# Patient Record
Sex: Female | Born: 1970
Health system: Southern US, Community
[De-identification: ages and names within clinical notes are randomized; demographics above are authoritative.]

## PROBLEM LIST (undated history)

## (undated) DIAGNOSIS — K219 Gastro-esophageal reflux disease without esophagitis: Secondary | ICD-10-CM

## (undated) DIAGNOSIS — E119 Type 2 diabetes mellitus without complications: Secondary | ICD-10-CM

## (undated) DIAGNOSIS — H5440 Blindness, one eye, unspecified eye: Secondary | ICD-10-CM

## (undated) DIAGNOSIS — I10 Essential (primary) hypertension: Secondary | ICD-10-CM

## (undated) DIAGNOSIS — I639 Cerebral infarction, unspecified: Secondary | ICD-10-CM

## (undated) DIAGNOSIS — N63 Unspecified lump in unspecified breast: Secondary | ICD-10-CM

## (undated) DIAGNOSIS — N189 Chronic kidney disease, unspecified: Secondary | ICD-10-CM

## (undated) DIAGNOSIS — K029 Dental caries, unspecified: Secondary | ICD-10-CM

## (undated) DIAGNOSIS — F319 Bipolar disorder, unspecified: Secondary | ICD-10-CM

## (undated) DIAGNOSIS — F32A Depression, unspecified: Secondary | ICD-10-CM

## (undated) DIAGNOSIS — F329 Major depressive disorder, single episode, unspecified: Secondary | ICD-10-CM

## (undated) HISTORY — DX: Type 2 diabetes mellitus without complications: E11.9

## (undated) HISTORY — PX: MULTIPLE TOOTH EXTRACTIONS: SHX2053

---
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
## 1999-08-10 ENCOUNTER — Emergency Department (HOSPITAL_COMMUNITY): Admission: EM | Admit: 1999-08-10 | Discharge: 1999-08-10 | Payer: Self-pay | Admitting: Emergency Medicine

## 1999-11-10 ENCOUNTER — Encounter: Payer: Self-pay | Admitting: Internal Medicine

## 1999-11-10 ENCOUNTER — Emergency Department (HOSPITAL_COMMUNITY): Admission: EM | Admit: 1999-11-10 | Discharge: 1999-11-10 | Payer: Self-pay | Admitting: Internal Medicine

## 2000-08-05 ENCOUNTER — Other Ambulatory Visit: Admission: RE | Admit: 2000-08-05 | Discharge: 2000-08-05 | Payer: Self-pay | Admitting: Obstetrics

## 2000-11-29 ENCOUNTER — Encounter: Payer: Self-pay | Admitting: Emergency Medicine

## 2000-11-29 ENCOUNTER — Emergency Department (HOSPITAL_COMMUNITY): Admission: EM | Admit: 2000-11-29 | Discharge: 2000-11-29 | Payer: Self-pay | Admitting: Emergency Medicine

## 2010-06-07 ENCOUNTER — Inpatient Hospital Stay (HOSPITAL_COMMUNITY): Admission: EM | Admit: 2010-06-07 | Discharge: 2010-06-09 | Payer: Self-pay | Admitting: Emergency Medicine

## 2010-06-07 DIAGNOSIS — H3411 Central retinal artery occlusion, right eye: Secondary | ICD-10-CM

## 2010-06-07 DIAGNOSIS — I1A0 Resistant hypertension: Secondary | ICD-10-CM | POA: Insufficient documentation

## 2010-06-07 DIAGNOSIS — H547 Unspecified visual loss: Secondary | ICD-10-CM

## 2010-06-07 DIAGNOSIS — I10 Essential (primary) hypertension: Secondary | ICD-10-CM

## 2010-06-08 ENCOUNTER — Encounter (INDEPENDENT_AMBULATORY_CARE_PROVIDER_SITE_OTHER): Payer: Self-pay | Admitting: Internal Medicine

## 2010-06-08 ENCOUNTER — Ambulatory Visit: Payer: Self-pay | Admitting: Surgery

## 2010-06-08 LAB — CONVERTED CEMR LAB: TSH: 3.287 microintl units/mL

## 2010-06-09 LAB — CONVERTED CEMR LAB
ALT: 13 units/L
AST: 15 units/L
Albumin: 3.6 g/dL
Alkaline Phosphatase: 66 units/L
BUN: 9 mg/dL
CO2: 28 meq/L
Calcium: 9.2 mg/dL
Chloride: 102 meq/L
Cholesterol: 148 mg/dL
Creatinine, Ser: 0.78 mg/dL
Glucose, Bld: 114 mg/dL
HCT: 40.3 %
HDL: 37 mg/dL
Hemoglobin: 13.6 g/dL
LDL Cholesterol: 92 mg/dL
MCHC: 33.6 g/dL
MCV: 80.5 fL
Platelets: 290 10*3/uL
Potassium: 3.7 meq/L
RBC: 5 M/uL
RDW: 16.2 %
Sodium: 138 meq/L
Total Bilirubin: 0.5 mg/dL
Total CHOL/HDL Ratio: 4
Total Protein: 7.2 g/dL
Triglycerides: 94 mg/dL
VLDL: 19 mg/dL
WBC: 8.6 10*3/uL

## 2010-06-27 ENCOUNTER — Ambulatory Visit: Payer: Self-pay | Admitting: Internal Medicine

## 2010-06-27 DIAGNOSIS — F411 Generalized anxiety disorder: Secondary | ICD-10-CM

## 2010-06-27 DIAGNOSIS — F17200 Nicotine dependence, unspecified, uncomplicated: Secondary | ICD-10-CM | POA: Insufficient documentation

## 2010-06-27 DIAGNOSIS — F418 Other specified anxiety disorders: Secondary | ICD-10-CM | POA: Insufficient documentation

## 2010-07-04 ENCOUNTER — Ambulatory Visit: Payer: Self-pay | Admitting: Nurse Practitioner

## 2010-07-11 ENCOUNTER — Ambulatory Visit: Payer: Self-pay | Admitting: Nurse Practitioner

## 2010-07-12 ENCOUNTER — Ambulatory Visit: Payer: Self-pay | Admitting: Internal Medicine

## 2010-07-18 ENCOUNTER — Ambulatory Visit: Payer: Self-pay | Admitting: Nurse Practitioner

## 2010-07-21 ENCOUNTER — Ambulatory Visit: Payer: Self-pay | Admitting: Internal Medicine

## 2010-07-28 ENCOUNTER — Telehealth (INDEPENDENT_AMBULATORY_CARE_PROVIDER_SITE_OTHER): Payer: Self-pay | Admitting: Nurse Practitioner

## 2010-08-10 ENCOUNTER — Ambulatory Visit: Payer: Self-pay | Admitting: Nurse Practitioner

## 2010-08-10 DIAGNOSIS — K219 Gastro-esophageal reflux disease without esophagitis: Secondary | ICD-10-CM

## 2010-08-10 DIAGNOSIS — F319 Bipolar disorder, unspecified: Secondary | ICD-10-CM

## 2010-08-10 LAB — CONVERTED CEMR LAB
BUN: 13 mg/dL (ref 6–23)
CO2: 28 meq/L (ref 19–32)
Calcium: 9.5 mg/dL (ref 8.4–10.5)
Chloride: 99 meq/L (ref 96–112)
Creatinine, Ser: 0.83 mg/dL (ref 0.40–1.20)
Glucose, Bld: 88 mg/dL (ref 70–99)
Potassium: 3.9 meq/L (ref 3.5–5.3)
Sodium: 138 meq/L (ref 135–145)

## 2010-08-11 ENCOUNTER — Encounter (INDEPENDENT_AMBULATORY_CARE_PROVIDER_SITE_OTHER): Payer: Self-pay | Admitting: Nurse Practitioner

## 2010-09-14 ENCOUNTER — Telehealth (INDEPENDENT_AMBULATORY_CARE_PROVIDER_SITE_OTHER): Payer: Self-pay | Admitting: Nurse Practitioner

## 2010-09-20 ENCOUNTER — Encounter (INDEPENDENT_AMBULATORY_CARE_PROVIDER_SITE_OTHER): Payer: Self-pay | Admitting: Nurse Practitioner

## 2010-09-26 ENCOUNTER — Telehealth (INDEPENDENT_AMBULATORY_CARE_PROVIDER_SITE_OTHER): Payer: Self-pay | Admitting: Nurse Practitioner

## 2010-12-04 ENCOUNTER — Ambulatory Visit: Admit: 2010-12-04 | Payer: Self-pay | Admitting: Internal Medicine

## 2010-12-28 NOTE — Letter (Signed)
Summary: PT INFORMATION SHEET  PT INFORMATION SHEET   Imported By: Arta Bruce 07/28/2010 15:31:26  _____________________________________________________________________  External Attachment:    Type:   Image     Comment:   External Document

## 2010-12-28 NOTE — Assessment & Plan Note (Signed)
Summary: Recheck right breast incision  Nurse Visit   Vital Signs:  Patient profile:   40 year old female Temp:     98.1 degrees F oral Pulse rate:   72 / minute Pulse rhythm:   regular Resp:     20 per minute BP sitting:   126 / 92  (right arm)  Vitals Entered By: Dutch Quint RN (July 21, 2010 4:05 PM)  Impression & Recommendations:  Problem # 1:  CELLULITIS AND ABSCESS OF RIGHT BREAST (ICD-682.2)  Her updated medication list for this problem includes:    Augmentin 875-125 Mg Tabs (Amoxicillin-pot clavulanate) .Marland Kitchen... 1 tab by mouth two times a day for 10 days  Problem # 2:  HYPERTENSION, BENIGN ESSENTIAL (ICD-401.1)  Her updated medication list for this problem includes:    Metoprolol Tartrate 50 Mg Tabs (Metoprolol tartrate) ..... One tablet by mouth twice a day    Lisinopril-hydrochlorothiazide 10-12.5 Mg Tabs (Lisinopril-hydrochlorothiazide) .Marland Kitchen..Marland Kitchen Two tablets by mouth daily for blood pressure  Complete Medication List: 1)  Alprazolam 0.5 Mg Tabs (Alprazolam) .... One tablet by mouth two times a day as needed for nerves 2)  Metoprolol Tartrate 50 Mg Tabs (Metoprolol tartrate) .... One tablet by mouth twice a day 3)  Sertraline Hcl 50 Mg Tabs (Sertraline hcl) .... One tablet by mouth nightly 4)  Lisinopril-hydrochlorothiazide 10-12.5 Mg Tabs (Lisinopril-hydrochlorothiazide) .... Two tablets by mouth daily for blood pressure 5)  Aspirin Childrens 81 Mg Chew (Aspirin) .... One tablet by mouth daily 6)  Augmentin 875-125 Mg Tabs (Amoxicillin-pot clavulanate) .Marland Kitchen.. 1 tab by mouth two times a day for 10 days   Patient Instructions: 1)  Reviewed with Dr. Delrae Alfred 2)  Complete antibiotic. 3)  Continue warm soaks as needed for drainage and wound healing. 4)  Call if you develop fever or if wound becomes reddened/drainage increases. 5)  Make appt.for blood pressure check in two weeks.   Physical Exam  Skin:  still has healing incision right areola.  Small area remains open,  no visible drainage.   CC:  f/u Wound check right breast.  History of Present Illness: Had I&D of abscess right breast 07/11/10.  States still has small amount of purulent drainage from site.  Still taking antibiotic.   Review of Systems Derm:  States still small amount of pus drains from wound..  CC: f/u Wound check right breast   Allergies: No Known Drug Allergies  Orders Added: 1)  Est. Patient Level I [16109]

## 2010-12-28 NOTE — Assessment & Plan Note (Signed)
Summary: HTN   Vital Signs:  Patient profile:   40 year old female LMP:     08/02/2010 Weight:      228.9 pounds BMI:     35.98 Temp:     98.3 degrees F oral Pulse rate:   80 / minute Pulse rhythm:   regular Resp:     20 per minute BP sitting:   160 / 100  (left arm) Cuff size:   large  Vitals Entered By: Levon Hedger (August 10, 2010 3:08 PM)  Nutrition Counseling: Patient's BMI is greater than 25 and therefore counseled on weight management options.  Serial Vital Signs/Assessments:  Time      Position  BP       Pulse  Resp  Temp     By                     136/90                         Lehman Prom FNP  Comments: done by Levon Hedger By: Lehman Prom FNP   CC: follow-up visit BP...needs something for her nerves..has been having alot of headaches, Hypertension Management, Depression Pain Assessment Patient in pain? no       Does patient need assistance? Functional Status Self care Ambulation Normal LMP (date): 08/02/2010     Enter LMP: 08/02/2010   CC:  follow-up visit BP...needs something for her nerves..has been having alot of headaches, Hypertension Management, and Depression.  History of Present Illness:  Pt into the office for f/u on htn.  Pt was seen for an abscess and was I&D'd by Dr. Delrae Alfred.  Pt reports that area is healing well.  Social - pt is employed at [employer]  Depression History:      The patient presents with symptoms of depression which have been present for greater than two weeks.  The patient is having a depressed mood most of the day and has a diminished interest in her usual daily activities.  Positive alarm features for depression include fatigue (loss of energy).  However, she denies insomnia and recurrent thoughts of death or suicide.        Psychosocial stress factors include major life changes.  The patient denies that she feels like life is not worth living, denies that she wishes that she were dead, and denies that  she has thought about ending her life.         Depression Treatment History:  Prior Medication Used:   Start Date: Assessment of Effect:   Comments:  Zoloft (sertraline)     08/10/2010     --       dose increased to 50mg  - 2 tablets daily  Hypertension History:      She complains of headache, but denies chest pain, palpitations, and syncope.  She notes no problems with any antihypertensive medication side effects.  Pt has been taking lisinopril/HCTZ - 2 tablets by mouth daily.  Pt was instructed to take only 1 daily.        Positive major cardiovascular risk factors include hypertension and current tobacco user.  Negative major cardiovascular risk factors include female age less than 28 years old and no history of diabetes or hyperlipidemia.        Further assessment for target organ damage reveals no history of ASHD, cardiac end-organ damage (CHF/LVH), stroke/TIA, peripheral vascular disease, or renal insufficiency.      Habits &  Providers  Alcohol-Tobacco-Diet     Alcohol drinks/day: 0     Tobacco Status: current     Tobacco Counseling: to quit use of tobacco products     Cigarette Packs/Day: 0.25     Year Started: age 1  Exercise-Depression-Behavior     Have you felt down or hopeless? no     Have you felt little pleasure in things? no     Depression Counseling: not indicated; screening negative for depression     Drug Use: marijuanna  Allergies (verified): No Known Drug Allergies  Review of Systems CV:  Denies chest pain or discomfort. Resp:  Denies cough. GI:  Complains of indigestion; denies abdominal pain, nausea, and vomiting; Admits that she does have some heartburn and indigestion from time to time. MS:  Complains of cramps; in legs bilateral on last night..  Physical Exam  General:  alert.   Head:  normocephalic.   Lungs:  normal breath sounds.   Heart:  normal rate and regular rhythm.   Abdomen:  normal bowel sounds.   Msk:  normal ROM.   Neurologic:  alert  & oriented X3.     Impression & Recommendations:  Problem # 1:  HYPERTENSION, BENIGN ESSENTIAL (ICD-401.1) BP is still elevated today. DASH diet reviewed. Pt advised to take medications as ordered Her updated medication list for this problem includes:    Metoprolol Tartrate 50 Mg Tabs (Metoprolol tartrate) ..... One tablet by mouth twice a day    Lisinopril-hydrochlorothiazide 20-25 Mg Tabs (Lisinopril-hydrochlorothiazide) ..... One tablet by mouth daily for blood pressure  Orders: T-Basic Metabolic Panel [phone])  Problem # 2:  TOBACCO ABUSE (ICD-305.1) pt would like to quit smoking Will order chantix but will need to get depression stable at first  Problem # 3:  DEPRESSION (ICD-311) will increase sertraline to 100mg  by mouth nightly Her updated medication list for this problem includes:    Alprazolam 0.5 Mg Tabs (Alprazolam) ..... One tablet by mouth two times a day as needed for nerves    Sertraline Hcl 50 Mg Tabs (Sertraline hcl) .Marland Kitchen..Marland Kitchen Two tablets by mouth daily  Problem # 4:  GASTROESOPHAGEAL REFLUX DISEASE (ICD-530.81) advised pt of foods to avoid Her updated medication list for this problem includes:    Ranitidine Hcl 150 Mg Tabs (Ranitidine hcl) ..... One tablet by mouth before breakfast and before dinner  Complete Medication List: 1)  Alprazolam 0.5 Mg Tabs (Alprazolam) .... One tablet by mouth two times a day as needed for nerves 2)  Metoprolol Tartrate 50 Mg Tabs (Metoprolol tartrate) .... One tablet by mouth twice a day 3)  Sertraline Hcl 50 Mg Tabs (Sertraline hcl) .... Two tablets by mouth daily 4)  Lisinopril-hydrochlorothiazide 20-25 Mg Tabs (Lisinopril-hydrochlorothiazide) .... One tablet by mouth daily for blood pressure 5)  Aspirin Childrens 81 Mg Chew (Aspirin) .... One tablet by mouth daily 6)  Ranitidine Hcl 150 Mg Tabs (Ranitidine hcl) .... One tablet by mouth before breakfast and before dinner  Hypertension Assessment/Plan:      The patient's  hypertensive risk group is category B: At least one risk factor (excluding diabetes) with no target organ damage.  Her calculated 10 year risk of coronary heart disease is 6 %.  Today's blood pressure is 160/100.  Her blood pressure goal is < 140/90.  Patient Instructions: 1)  Schedule an appointment for a complete physical exam in the next 4-6 weeks. 2)  You will need PAP, Mammogram, u/a, microalbumin. 3)  Will get flu vaccine if available  4)  Will discuss smoking cessation Prescriptions: LISINOPRIL-HYDROCHLOROTHIAZIDE 20-25 MG TABS (LISINOPRIL-HYDROCHLOROTHIAZIDE) One tablet by mouth daily for blood pressure  #30 x 3   Entered and Authorized by:   Lehman Prom FNP   Signed by:   Lehman Prom FNP on 08/10/2010   Method used:   Print then Give to Patient   RxID:   [id number] RANITIDINE HCL 150 MG TABS (RANITIDINE HCL) One tablet by mouth before breakfast and before dinner  #60 x 3   Entered and Authorized by:   Lehman Prom FNP   Signed by:   Lehman Prom FNP on 08/10/2010   Method used:   Print then Give to Patient   RxID:   [id number]

## 2010-12-28 NOTE — Assessment & Plan Note (Signed)
Summary: NEW - Establish Care   Vital Signs:  Patient profile:   40 year old female LMP:     06/04/2010 Height:      67 inches Weight:      229.9 pounds BMI:     36.14 BSA:     2.15 Pulse rate:   72 / minute Pulse rhythm:   regular Resp:     16 per minute BP sitting:   190 / 110  (left arm) Cuff size:   large  Vitals Entered By: Levon Hedger (June 27, 2010 10:31 AM)  Nutrition Counseling: Patient's BMI is greater than 25 and therefore counseled on weight management options.  Serial Vital Signs/Assessments:  Time      Position  BP       Pulse  Resp  Temp     By           R Arm     158/100                        Levon Hedger           L Arm     170/110                        Levon Hedger both BP's done manually CC: follow-up visit [facility]....blindness in right eye...HTN, Hypertension Management Is Patient Diabetic? No Pain Assessment Patient in pain? yes       Does patient need assistance? Functional Status Self care Ambulation Normal LMP (date): 06/04/2010     Enter LMP: 06/04/2010   CC:  follow-up visit [facility]....blindness in right eye.Marland KitchenMarland KitchenHTN and Hypertension Management.  History of Present Illness:  Pt into the office for hospital f/u, Pt was previously a pt at [facility] over 3 years ago. No routine PCP since that time.  Hospitalized from 06/07/2010 to 06/09/2010 On day of presentation pt woke up with blindness in the right eye.  She went to the ER.  BP noted to be very elevated.  No previous Dx of HTN however strong family hx  1. 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 2 D Echo - unremarkable Carotid dopplers - unremarkable Hypercoagulable panel - unremarkable ANA -negative Pt did go see the neurologist [facility]) as ordered - she was told that her vision loss was permanent.  2.  HTN - Started on metporolol 25mg  by mouth two times a day.  5 days ago she started taking once per day until her  visit here.    3. Tobacco abuse - ongoing.  Pt was started on a patch in the hospital but she reports that she did not wear it upon d/c because it made her sick to her stomach  4.  Anxiety - increased since recent hospitalization.  she has been taking sertralazine and alprazolam which she reports has been making her sleepy.  she has been taking alprazolam as ordered and NOT as needed     Hypertension History:      She denies headache, chest pain, and palpitations.  She notes no problems with any antihypertensive medication side effects.        Positive major cardiovascular risk factors include hypertension and current tobacco Jayma Volpi.  Negative major cardiovascular risk factors include female age less than 68 years old and no history of diabetes or hyperlipidemia.        Further assessment for target organ damage reveals  no history of ASHD, cardiac end-organ damage (CHF/LVH), stroke/TIA, peripheral vascular disease, or renal insufficiency.     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 Eyes:  Complains of vision loss-1 eye; right . CV:  Denies chest pain or discomfort. Resp:  Denies cough. GI:  Complains of indigestion.  Physical Exam  General:  obese Head:  normocephalic.   Lungs:  normal breath sounds.   Heart:  normal rate and regular rhythm.     Abdomen:  normal bowel sounds.   Msk:  up to the exam table Neurologic:  alert & oriented X3.     Impression & Recommendations:  Problem # 1:  HYPERTENSION, BENIGN ESSENTIAL (ICD-401.1)  BP elevated today will give clonidine 0.1mg  by mouth x 1 dose in office handout given, diet reviewed Her updated medication list for this problem includes:    Metoprolol Tartrate 25 Mg Tabs (Metoprolol tartrate) ..... One tablet by mouth two times a day for blood pressure    Lisinopril-hydrochlorothiazide 10-12.5 Mg Tabs (Lisinopril-hydrochlorothiazide) ..... One tablet by mouth daily for blood pressure  Orders: Clonidine 0.1mg  tab [facility])  Problem # 2:  ANXIETY (ICD-300.00) advised pt to take sertraline nightly take alprazolam as needed  Her updated medication list for this problem includes:    Alprazolam 0.5 Mg Tabs (Alprazolam) ..... One tablet by mouth two times a day as needed for nerves    Sertraline Hcl 50 Mg Tabs (Sertraline hcl) ..... 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 One tablet by mouth two times a day as needed for nerves 2)  Metoprolol Tartrate 25 Mg Tabs (Metoprolol tartrate) .... One tablet by mouth two times a day for blood pressure 3)  Sertraline Hcl 50 Mg Tabs (Sertraline hcl) .... One tablet by mouth nightly 4)  Lisinopril-hydrochlorothiazide 10-12.5 Mg Tabs (Lisinopril-hydrochlorothiazide) .... One tablet by mouth daily for blood pressure 5)  Aspirin Childrens 81 Mg Chew (Aspirin) .... One tablet by mouth daily  Hypertension Assessment/Plan:      The patient's hypertensive risk group is category B: At least one risk factor (excluding diabetes) with no target organ damage.  Her calculated 10 year risk of  coronary heart disease is 6 %.  Today's blood pressure is 190/110.  Her blood pressure goal is < 140/90.  Patient Instructions: 1)  You need to get an Eligibility appointment. 2)  Your blood pressure is VERY high today. 3)  Get your medications and take as ordered 4)  consider changing your lifestyle - decrease salt in your diet,  start walking to decrease weight, stop smoking. 5)  Schedule a Triage visit for blood pressure check in 1 week with Aggie Cosier.  Take your medications at least 2 hours before this visit. 6)  Goal less than 140/85 7)  Follow up with n.martin,fnp for htn and anxiety after eligibility Prescriptions: LISINOPRIL-HYDROCHLOROTHIAZIDE 10-12.5 MG TABS (LISINOPRIL-HYDROCHLOROTHIAZIDE) One tablet by mouth daily for blood pressure  #30 x 1   Entered and Authorized by:   Lehman Prom FNP   Signed by:   Lehman Prom FNP on 06/27/2010   Method used:   Print then Give to Patient   RxID:   [id number] METOPROLOL TARTRATE 25 MG TABS (METOPROLOL TARTRATE) One tablet by mouth two times a day for blood pressure  #60 x 1   Entered and Authorized by:   Lehman Prom FNP   Signed by:   Lehman Prom FNP on 06/27/2010   Method used:   Print then Give to Patient   RxID:   [id number] SERTRALINE HCL 50 MG TABS (SERTRALINE HCL) One tablet by mouth nightly  #30 x 1   Entered and Authorized by:   Lehman Prom FNP   Signed by:   Lehman Prom FNP on 06/27/2010   Method used:   Print then Give to Patient   RxID:   [id number]    MRI Brain  Procedure date:  06/07/2010  Findings:      Showed partially empty sella appearance, cna be a normal variant partially associated with idiopathic intracranial hypertension which can be cause of headache and vision change  CT Scan  Procedure date:  06/07/2010  Findings:      non aggressive imaging features.   no acute intracranial abnormality, specifically no cause of vision loss right eye identified   MRI  Brain  Procedure date:  06/07/2010  Findings:      Showed partially empty sella appearance, cna be a normal variant partially associated with idiopathic intracranial hypertension which can be cause of headache and vision change  CT Scan  Procedure date:  06/07/2010  Findings:      non aggressive imaging features.   no acute intracranial abnormality, specifically no cause of vision loss right eye identified   Medication Administration  Medication # 1:    Medication: Clonidine 0.1mg  tab    Diagnosis: HYPERTENSION, BENIGN ESSENTIAL (ICD-401.1)    Dose: 1 tablet    Route: po    Exp Date: 10/2010    Lot #: [id number]    Mfr: [employer]    Patient tolerated medication without complications    Given by: Levon Hedger (June 27, 2010 5:02 PM)  Orders Added: 1)  New Patient Level III [99203] 2)  Clonidine 0.1mg  tab [EMRORAL]

## 2010-12-28 NOTE — Assessment & Plan Note (Signed)
Summary: F/U for abscess right breast and BP  Nurse Visit   Vital Signs:  Patient profile:   40 year old female Pulse rate:   88 / minute Pulse rhythm:   regular Resp:     20 per minute BP sitting:   156 / 106  (left arm)  Vitals Entered By: Dutch Quint RN (July 18, 2010 3:41 PM)  Patient Instructions: 1)  Discussed with Wende Mott. 2)  May cleanse area with Betasept -- you can get it over the counter. 3)  Continue warm compresses every two hours for twenty minutes. 4)  Wound is still draining.  Keep light bandage over site. 5)  Complete entire course of antibiotics. 6)  Your blood pressure was elevated today, so we are Increasing Metoprolol to 50 mg. by mouth two times a day. 7)  Return for wound check on Friday with triage nurse and Dr. Delrae Alfred. 8)  Call if anything changes or drainage increases.  CC: F/U I&D right breast and BP Pain Assessment Patient in pain? no       Does patient need assistance? Functional Status Self care Ambulation Normal Comments States she gets nervous when she comes to see the Mayan Dolney -- BP is elevated today.   CC:  F/U I&D right breast and BP.  History of Present Illness: Here for f/u right breast abcess I&D -- states she feels "like a new woman."  Had had significant drainage of purulent material after procedure, stopped draining two days ago.    Physical Exam  Skin:  Slightly firm around incision site, faint erythema, no active drainage.  No dressing on wound.  as above; abscess area somewhat fluctuant expressed fair amount of purulent drainage    Impression & Recommendations:  Problem # 1:  CELLULITIS AND ABSCESS OF RIGHT BREAST (ICD-682.2) Assessment Improved finish antibxs f/u Triage RN on Friday for recheck  Her updated medication list for this problem includes:    Augmentin 875-125 Mg Tabs (Amoxicillin-pot clavulanate) .Marland Kitchen... 1 tab by mouth two times a day for 10 days  Problem # 2:  HYPERTENSION, BENIGN ESSENTIAL  (ICD-401.1) increase metoprolol and repeat bp check in 2 weeks  Her updated medication list for this problem includes:    Metoprolol Tartrate 50 Mg Tabs (Metoprolol tartrate) ..... One tablet by mouth twice a day    Lisinopril-hydrochlorothiazide 10-12.5 Mg Tabs (Lisinopril-hydrochlorothiazide) .Marland Kitchen..Marland Kitchen Two tablets by mouth daily for blood pressure  Complete Medication List: 1)  Alprazolam 0.5 Mg Tabs (Alprazolam) .... One tablet by mouth two times a day as needed for nerves 2)  Metoprolol Tartrate 50 Mg Tabs (Metoprolol tartrate) .... One tablet by mouth twice a day 3)  Sertraline Hcl 50 Mg Tabs (Sertraline hcl) .... One tablet by mouth nightly 4)  Lisinopril-hydrochlorothiazide 10-12.5 Mg Tabs (Lisinopril-hydrochlorothiazide) .... Two tablets by mouth daily for blood pressure 5)  Aspirin Childrens 81 Mg Chew (Aspirin) .... One tablet by mouth daily 6)  Augmentin 875-125 Mg Tabs (Amoxicillin-pot clavulanate) .Marland Kitchen.. 1 tab by mouth two times a day for 10 days   Review of Systems Derm:  Complains of itching; Incision site to right breast healing, skin edges still slightly open..   Allergies: No Known Drug Allergies  Orders Added: 1)  Est. Patient Level II [04540] Prescriptions: METOPROLOL TARTRATE 50 MG TABS (METOPROLOL TARTRATE) one tablet by mouth twice a day  #60 x 3   Entered by:   Dutch Quint RN   Authorized by:   Tereso Newcomer PA-C   Signed by:  Tereso Newcomer PA-C on 07/18/2010   Method used:   Faxed to ...       [facility] (retail)       [address] [place], [place]  [address]       Ph: [phone]       Fax: [phone]   RxID:   [id number]

## 2010-12-28 NOTE — Assessment & Plan Note (Signed)
Summary: f/u I&D  Nurse Visit   Vital Signs:  Patient profile:   40 year old female Temp:     97.6 degrees F Pulse rate:   68 / minute Pulse rhythm:   regular Resp:     20 per minute BP sitting:   132 / 90  (left arm)  Vitals Entered By: Dutch Quint RN (July 12, 2010 4:06 PM)  History of Present Illness: Had I&D yesterday for abscess right areola.  Returned today for wound check.  Provider note:  Pain and tenderness much better.  Has had a lot of bloody purulent discharge from wound.   Physical Exam  Chest Wall:  fluctuant mass essentially resolved.  Still some superficial swelling and induration. 1 cm wound from I and D still open with healthy appearing tissue at opening.  No current drainage.  Much less tender. Skin:  Incisions clean, dry.  No drainage noted actively, scant sanguinous drainage on old dressing.  Wound redressed.   Impression & Recommendations:  Problem # 1:  CELLULITIS AND ABSCESS OF RIGHT BREAST (ICD-682.2) Much improved Repeat Ceftriaxone 1 g IM Called [facility]--will provide course of Augmentin for pt. as she is not yet eligible.  To pick up tomorrow afternoon and start then. Pt. to follow up with Jesse Fall beginning of next week if possible for bp and abscess--if unable to schedule, I will see her at end of week--only if pt. continuing to improve--she is to call if worsens. Her updated medication list for this problem includes:    Augmentin 875-125 Mg Tabs (Amoxicillin-pot clavulanate) .Marland Kitchen... 1 tab by mouth two times a day for 10 days  Orders: Dressing 4x4 UP (U0454) Admin of Therapeutic Inj  intramuscular or subcutaneous ([address]11) Rocephin  250mg  (B1478)  Complete Medication List: 1)  Alprazolam 0.5 Mg Tabs (Alprazolam) .... One tablet by mouth two times a day as needed for nerves 2)  Metoprolol Tartrate 25 Mg Tabs (Metoprolol tartrate) .... One tablet by mouth two times a day for blood pressure 3)  Sertraline Hcl 50 Mg Tabs (Sertraline hcl) ....  One tablet by mouth nightly 4)  Lisinopril-hydrochlorothiazide 10-12.5 Mg Tabs (Lisinopril-hydrochlorothiazide) .... Two tablets by mouth daily for blood pressure 5)  Aspirin Childrens 81 Mg Chew (Aspirin) .... One tablet by mouth daily 6)  Augmentin 875-125 Mg Tabs (Amoxicillin-pot clavulanate) .Marland Kitchen.. 1 tab by mouth two times a day for 10 days   Patient Instructions: 1)  Cancel follow up with Dr. Delrae Alfred tomorrow. 2)  Cancel follow up with Jesse Fall on Friday.   3)  Reschedule with Jesse Fall, FNP beginning of next week for follow up abscess of right breast.   Allergies: No Known Drug Allergies  Medication Administration  Injection # 1:    Medication: Rocephin  250mg     Diagnosis: CELLULITIS AND ABSCESS OF RIGHT BREAST (ICD-682.2)    Route: IM    Site: LUOQ gluteus    Exp Date: 05/26/2011    Lot #: [id number]    Mfr: Apotex    Comments: [id number] [id number]    Patient tolerated injection without complications    Given by: Dutch Quint RN (July 12, 2010 4:15 PM) Prescriptions: AUGMENTIN 875-125 MG TABS (AMOXICILLIN-POT CLAVULANATE) 1 tab by mouth two times a day for 10 days  #20 x 0   Entered and Authorized by:   Julieanne Manson MD   Signed by:   Julieanne Manson MD on 07/12/2010   Method used:   Faxed to .Marland KitchenMarland Kitchen  [facility] (retail)       [address] [place], [place]  [address]       Ph: [phone]       Fax: [phone]   RxID:   [id number]    Vital Signs:  Patient Profile:   40 year old female Height:     67 inches Temp:     97.6 degrees F Pulse rate:   68 / minute Pulse rhythm:   regular Resp:     20 per minute BP sitting:   132 / 90

## 2010-12-28 NOTE — Assessment & Plan Note (Signed)
Summary: Follow-Up BP check - nurse visit  Nurse Visit   Vital Signs:  Patient profile:   40 year old female Weight:      229.7 pounds Pulse rate:   64 / minute Pulse rhythm:   regular BP sitting:   140 / 92  (right arm)  Vitals Entered By: Dutch Quint RN (July 04, 2010 9:46 AM)  Patient Instructions: 1)  Per Jesse Fall: 2)  Blood pressure is not quite at goal, but much better - goal is less than 140/85. 3)  Return in one week for follow-up blood pressure check. 4)  Increase your Lisinopril to two tablets a day until you come back, along with the Metoprolol one tablet per day. 5)  Keep log of blood pressure checks and bring with you to your next visit. 6)  Continue with your dietary changes 7)  If you have any problems, call the office and let us know.  CC: F/U BP check Pain Assessment Patient in pain? yes     Location: head Intensity: 3 Type: aching Onset of pain  gradual, started about a week ago   CC:  F/U BP check.  History of Present Illness: f/u BP check - last visit very elevated. States took meds at 8 am this morning.  Doing well on meds, sleeps better, feels better when she gets up in the morning.  Still a little depressed.  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  Unsure of results, advised to bring a log to next visit.  Has slight headache, about 3 on pain scale.  Denies usual headache, dizziness, ankle edema or visual disturbances. Takes Tylenol for headache.   Review of Systems General:  Complains of malaise.   Impression & Recommendations:  Problem # 1:  HYPERTENSION, BENIGN ESSENTIAL (ICD-401.1)  Her updated medication list for this problem includes:    Metoprolol Tartrate 25 Mg Tabs (Metoprolol tartrate) ..... One tablet by mouth two times a day for blood pressure    Lisinopril-hydrochlorothiazide 10-12.5 Mg Tabs (Lisinopril-hydrochlorothiazide) .Marland Kitchen..Marland Kitchen Two tablets by mouth daily for blood pressure  Complete Medication  List: 1)  Alprazolam 0.5 Mg Tabs (Alprazolam) .... One tablet by mouth two times a day as needed for nerves 2)  Metoprolol Tartrate 25 Mg Tabs (Metoprolol tartrate) .... One tablet by mouth two times a day for blood pressure 3)  Sertraline Hcl 50 Mg Tabs (Sertraline hcl) .... One tablet by mouth nightly 4)  Lisinopril-hydrochlorothiazide 10-12.5 Mg Tabs (Lisinopril-hydrochlorothiazide) .... Two tablets by mouth daily for blood pressure 5)  Aspirin Childrens 81 Mg Chew (Aspirin) .... One tablet by mouth daily   Allergies: No Known Drug Allergies  Orders Added: 1)  Est. Patient Level I [78242]

## 2010-12-28 NOTE — Letter (Signed)
Summary: Generic Letter  [facility]  [address] [place], [place] [address]   Phone: [phone]  Fax: [phone]        09/20/2010  [facility] [address] [place], [place]  [address]  Dear Ms. Shifflett,  We have been unable to contact you by telephone.  Please call our office, at your earliest convenience, so that we may speak with you.  Sincerely,   Dutch Quint RN

## 2010-12-28 NOTE — Assessment & Plan Note (Signed)
Summary: BP recheck and possible boil right breast  Nurse Visit   Vital Signs:  Patient profile:   40 year old female Pulse rate:   68 / minute Pulse rhythm:   regular Resp:     20 per minute BP sitting:   160 / 104  (left arm) Cuff size:   large  Vitals Entered By: Dutch Quint RN (July 11, 2010 3:46 PM)  Patient Instructions: 1)  Warm water soaks for 20 minutes three times a day  2)  Call for fever, increased pain, swelling 3)  Appt. tomorrow with Aggie Cosier 3:30 p.m.   Serial Vital Signs/Assessments:  Time      Position  BP       Pulse  Resp  Temp     By 4:20 PM             144/98                         Dutch Quint RN  Comments: 4:20 PM Rested x20 minutes after first blood pressure obtained. By: Dutch Quint RN    History of Present Illness: In office for BP recheck.  Also c/o painful "boil-type" area on lateral right breast.  Provider note:  As above--no fevers 3- 4 cm  in diameter fluctuant and circular mass with overlying erythema and induration involving right breast at about 10-11 O'clock and extending onto peripheral areola in same area.  Quite tender.  No definte axillary adenopathy.  After obtaining consent, area was cleaned in sterile fashion.  1 % Lidocaine  2 cc injected to subcutaneously in area of fluctuance.  11 blade used to incise mass. Initially, just blood from wound, but with use of hemostat, was able to mobilize sanguinous milky discharge.  No odor.  I was unable to get a wick to stay in the open wound.  Pt. tolerated procedure well without complication.   Impression & Recommendations:  Problem # 1:  CELLULITIS AND ABSCESS OF RIGHT BREAST (ICD-682.2) Ceftriaxone 1 g IM Change dressing with each warm water soak and as needed.  Orders: T-Culture, Wound (87070/[id number]) T- * Misc. Laboratory test ([phone]) Admin of Therapeutic Inj  intramuscular or subcutaneous (08657) Rocephin  250mg  (Q4696) Dressing 4x4 UP (E9528) Surgical Suture Tray  (U1324) I&D Abscess, Simple / Single (10060)  Complete Medication List: 1)  Alprazolam 0.5 Mg Tabs (Alprazolam) .... One tablet by mouth two times a day as needed for nerves 2)  Metoprolol Tartrate 25 Mg Tabs (Metoprolol tartrate) .... One tablet by mouth two times a day for blood pressure 3)  Sertraline Hcl 50 Mg Tabs (Sertraline hcl) .... One tablet by mouth nightly 4)  Lisinopril-hydrochlorothiazide 10-12.5 Mg Tabs (Lisinopril-hydrochlorothiazide) .... Two tablets by mouth daily for blood pressure 5)  Aspirin Childrens 81 Mg Chew (Aspirin) .... One tablet by mouth daily   Review of Systems  The patient denies chest pain, syncope, peripheral edema, and prolonged cough.         Does have headaches that come and go, primarily left temporal.  No other symptoms accompany headaches.   Physical Exam  Skin:  Erythema, heat, raised, hardened area noted to lateral right areola.  Painful to touch, area is closed, no drainage.    Pain Assessment Patient in pain? yes     Location: right breast Intensity: 7 Type: sharp Onset of pain  about three days ago  Does patient need assistance? Functional Status Self care Ambulation Normal   Allergies:  No Known Drug Allergies  Medication Administration  Injection # 1:    Medication: Rocephin  250mg     Diagnosis: CELLULITIS AND ABSCESS OF RIGHT BREAST (ICD-682.2)    Route: IM    Site: RUOQ gluteus    Exp Date: 05/26/2011    Lot #: [id number]    Mfr: Apotex    Comments: [id number] [id number]    Patient tolerated injection without complications    Given by: Dutch Quint RN (July 11, 2010 5:49 PM)  Orders Added: 1)  T-Culture, Wound [[id number]] 2)  T- * Misc. Laboratory test [phone] 3)  Admin of Therapeutic Inj  intramuscular or subcutaneous [96372] 4)  Rocephin  250mg  [J0696] 5)  Dressing 4x4 UP [A6402] 6)  Surgical Suture Tray [A4550] 7)  New Patient Level III [99203] 8)  Est. Patient Level III [59563] 9)  I&D Abscess, Simple  / Single [10060]   Vital Signs:  Patient Profile:   40 year old female Height:     67 inches Pulse rate:   68 / minute Pulse rhythm:   regular Resp:     20 per minute BP sitting:   160 / 104 Cuff size:   large    Location:   right breast    Intensity:   7    Type:       sharp

## 2010-12-28 NOTE — Progress Notes (Signed)
Summary: PT RETURNING YOUR CALL  Phone Note Call from Patient Call back at [phone]   Summary of Call: PATIENT RETURNING YOUR CALL [phone] Initial call taken by: Domenic Polite,  September 26, 2010 11:32 AM  Follow-up for Phone Call        pt. notified.  Follow-up by: Gaylyn Cheers RN,  September 28, 2010 4:01 PM

## 2010-12-28 NOTE — Letter (Signed)
Summary: *HSN Results Follow up  [facility]  [address] [place], [place] [address]   Phone: [phone]  Fax: [phone]      08/11/2010   [facility] [address] [place], [place]  [address]   Dear  [name],                            ____S.Drinkard,FNP   ____D. Gore,FNP       ____B. McPherson,MD   ____V. Rankins,MD    ____E. Mulberry,MD    _X___N. Daphine Deutscher, FNP  ____D. Reche Dixon, MD    ____K. Philipp Deputy, MD    ____Other     This letter is to inform you that your recent test(s):  _______Pap Smear    ___X____Lab Test     _______X-ray    ___X____ is within acceptable limits  _______ requires a medication change  _______ requires a follow-up lab visit  _______ requires a follow-up visit with your provider   Comments: Labs done during recent office visit is normal.       _________________________________________________________ If you have any questions, please contact our office [phone]                    Sincerely,    Lehman Prom FNP [facility]

## 2010-12-28 NOTE — Progress Notes (Signed)
Summary: NEEDS REFILLS  Phone Note Call from Patient Call back at Home Phone [phone]   Reason for Call: Refill Medication Summary of Call: [facility] pt. MS Muench STOPPED BY BECAUSE SHE NEEDS REFILLS ON HER SERTRALINE AND ALPRAZOLAM CALLED INTO [facility] ON [address]  MS Hagins IS SCHED FOR HER CPP ON 11/17. Initial call taken by: Leodis Rains,  September 14, 2010 2:57 PM  Follow-up for Phone Call        Sent to N. Daphine Deutscher.  Dutch Quint RN  September 14, 2010 3:52 PM   Additional Follow-up for Phone Call Additional follow up Details #1::        I do not see that we have filled Alprazolam--leave for primary provider Additional Follow-up by: Julieanne Manson MD,  September 15, 2010 5:50 PM    Additional Follow-up for Phone Call Additional follow up Details #2::    pt takes alprazolam sparingly - rx printed and in basket fax back to [facility] Follow-up by: Lehman Prom FNP,  September 18, 2010 1:58 PM  Additional Follow-up for Phone Call Additional follow up Details #3:: Details for Additional Follow-up Action Taken: Sertraline refilled 09/15/10 and sent to [facility].  Alprazolam Faxed to [facility].  Left message on answering machine for pt. to return call.  Dutch Quint RN  September 18, 2010 3:16 PM  Left message on answering machine for pt. to return call.  Dutch Quint RN  September 19, 2010 4:42 PM  Left message on answering machine for pt. to return call.  Letter sent.  Dutch Quint RN  September 20, 2010 10:27 AM   Prescriptions: ALPRAZOLAM 0.5 MG TABS (ALPRAZOLAM) One tablet by mouth two times a day as needed for nerves  #30 x 0   Entered and Authorized by:   Lehman Prom FNP   Signed by:   Lehman Prom FNP on 09/18/2010   Method used:   Printed then faxed to ...       [facility] [address] [phone]* (retail)       [address]       [place], [place]  [address]       Ph: [phone]       Fax: [phone]   RxID:   [id number] SERTRALINE HCL 50 MG TABS (SERTRALINE  HCL) Two tablets by mouth daily  #60 x 2   Entered by:   Julieanne Manson MD   Authorized by:   Lehman Prom FNP   Signed by:   Julieanne Manson MD on 09/15/2010   Method used:   Faxed to ...       [facility] (retail)       [address] [place], [place]  [address]       Ph: [phone]       Fax: [phone]   RxID:   [id number]

## 2010-12-28 NOTE — Progress Notes (Signed)
Summary: NEEDS REFILL ON LISINOPRIL  Phone Note Call from Patient Call back at Home Phone [phone]   Reason for Call: Refill Medication Summary of Call: Kendra Vega. MS Weilbacher STOPPD BY BECAUSE ON HER LISINORIL, IT HAS NO REFILLS AND SHE HAS ENOUGH TILL SUNDAY, AND SHE WILL NEED YOU TO CALL MORE INTO [facility] ON [address] TODAY. Initial call taken by: Kimberly Tinnin,  July 28, 2010 2:59 PM  Follow-up for Phone Call        forward to N. Martin, FNP Follow-up by: Brenda Craddock,  July 28, 2010 4:30 PM  Additional Follow-up for Phone Call Additional follow up Details #1::        [facility] to [facility] advise Vega that rx was increased to the higher dose so she will take lisinopril HCTZ 20/25 - 1 tablet by mouth daily Additional Follow-up by: Nykedtra Martin FNP,  July 28, 2010 4:37 PM    Additional Follow-up for Phone Call Additional follow up Details #2::    Vega informed.  Follow-up by: Brenda Craddock,  August 01, 2010 4:19 PM  New/Updated Medications: LISINOPRIL-HYDROCHLOROTHIAZIDE 20-25 MG TABS (LISINOPRIL-HYDROCHLOROTHIAZIDE) One tablet by mouth daily for blood pressure  **note change in dose** Prescriptions: LISINOPRIL-HYDROCHLOROTHIAZIDE 20-25 MG TABS (LISINOPRIL-HYDROCHLOROTHIAZIDE) One tablet by mouth daily for blood pressure  **note change in dose**  #30 x 3   Entered and Authorized by:   Nykedtra Martin FNP   Signed by:   Nykedtra Martin FNP on 07/28/2010   Method used:   Electronically to        [facility] [address] [id number]* (retail)       [address]       [place], [place]  [address]       Ph: [phone]       Fax: [phone]   RxID:   [id number]

## 2011-01-19 ENCOUNTER — Telehealth (INDEPENDENT_AMBULATORY_CARE_PROVIDER_SITE_OTHER): Payer: Self-pay | Admitting: Nurse Practitioner

## 2011-01-19 ENCOUNTER — Encounter (INDEPENDENT_AMBULATORY_CARE_PROVIDER_SITE_OTHER): Payer: Self-pay | Admitting: Nurse Practitioner

## 2011-01-19 ENCOUNTER — Other Ambulatory Visit: Payer: Self-pay | Admitting: Nurse Practitioner

## 2011-01-19 ENCOUNTER — Encounter: Payer: Self-pay | Admitting: Nurse Practitioner

## 2011-01-19 ENCOUNTER — Other Ambulatory Visit (HOSPITAL_COMMUNITY): Payer: Self-pay | Admitting: Internal Medicine

## 2011-01-19 DIAGNOSIS — B079 Viral wart, unspecified: Secondary | ICD-10-CM | POA: Insufficient documentation

## 2011-01-19 DIAGNOSIS — Z1231 Encounter for screening mammogram for malignant neoplasm of breast: Secondary | ICD-10-CM

## 2011-01-19 DIAGNOSIS — B379 Candidiasis, unspecified: Secondary | ICD-10-CM | POA: Insufficient documentation

## 2011-01-19 DIAGNOSIS — E669 Obesity, unspecified: Secondary | ICD-10-CM | POA: Insufficient documentation

## 2011-01-19 HISTORY — DX: Viral wart, unspecified: B07.9

## 2011-01-19 LAB — CONVERTED CEMR LAB
ALT: 19 units/L (ref 0–35)
Basophils Absolute: 0 10*3/uL (ref 0.0–0.1)
Bilirubin Urine: NEGATIVE
Blood in Urine, dipstick: NEGATIVE
CO2: 27 meq/L (ref 19–32)
Calcium: 10.2 mg/dL (ref 8.4–10.5)
Chlamydia, DNA Probe: NEGATIVE
Chloride: 100 meq/L (ref 96–112)
GC Probe Amp, Genital: NEGATIVE
Glucose, Urine, Semiquant: NEGATIVE
Hemoglobin: 12.8 g/dL (ref 12.0–15.0)
KOH Prep: NEGATIVE
Ketones, urine, test strip: NEGATIVE
Lymphocytes Relative: 35 % (ref 12–46)
Monocytes Absolute: 0.6 10*3/uL (ref 0.1–1.0)
Neutro Abs: 5.6 10*3/uL (ref 1.7–7.7)
Nitrite: NEGATIVE
OCCULT 1: NEGATIVE
Platelets: 306 10*3/uL (ref 150–400)
RDW: 15.7 % — ABNORMAL HIGH (ref 11.5–15.5)
Sodium: 137 meq/L (ref 135–145)
Specific Gravity, Urine: 1.015
TSH: 2.262 microintl units/mL (ref 0.350–4.500)
Total Protein: 7.7 g/dL (ref 6.0–8.3)
Urobilinogen, UA: 0.2
WBC Urine, dipstick: NEGATIVE
pH: 7.5

## 2011-01-23 ENCOUNTER — Encounter (INDEPENDENT_AMBULATORY_CARE_PROVIDER_SITE_OTHER): Payer: Self-pay | Admitting: Nurse Practitioner

## 2011-01-23 ENCOUNTER — Telehealth (INDEPENDENT_AMBULATORY_CARE_PROVIDER_SITE_OTHER): Payer: Self-pay | Admitting: Nurse Practitioner

## 2011-01-23 NOTE — Progress Notes (Signed)
Summary: Referral Derm & Ophthalmology   Phone Note Outgoing Call   Summary of Call: Refer pt to [facility] dermatology (right hand wart) Refer to Optho  Initial call taken by: Lehman Prom FNP,  January 19, 2011 11:08 AM  Follow-up for Phone Call        PT HAS AN APPT FOR DERMATOLOGY  02-13-11 @ 5PM PT AWARE OF HER APPT  -OPHTHALMOLOGY REFERRAL I SEND IT TO [facility] SO THEY CAN SHEDULE AN APPT . WAITING FOR AN APPT  Follow-up by: Cheryll Dessert,  January 19, 2011 11:44 AM

## 2011-01-23 NOTE — Progress Notes (Signed)
Summary: Office Visit//DEPRESSION SCREENING  Office Visit//DEPRESSION SCREENING   Imported By: Arta Bruce 01/19/2011 12:34:26  _____________________________________________________________________  External Attachment:    Type:   Image     Comment:   External Document

## 2011-01-23 NOTE — Assessment & Plan Note (Signed)
Summary: Complete Physical Exam   Vital Signs:  Patient profile:   40 year old female LMP:     12/27/2010 Weight:      245.2 pounds Temp:     98.1 degrees F oral Pulse rate:   74 / minute Pulse rhythm:   regular Resp:     20 per minute BP sitting:   140 / 100  (left arm) Cuff size:   large  Vitals Entered By: Levon Hedger (January 19, 2011 10:32 AM) CC: CPP, Depression, Hypertension Management, Abdominal Pain Is Patient Diabetic? No Pain Assessment Patient in pain? no       Does patient need assistance? Functional Status Self care Ambulation Normal  Vision Screening:Left eye w/o correction: 20 / 25 Both eyes w/o correction:  20/ 40       Vision Comments: pt is blind in the right eye.  Vision Entered By: Levon Hedger (January 19, 2011 10:44 AM) LMP (date): 12/27/2010     Enter LMP: 12/27/2010   CC:  CPP, Depression, Hypertension Management, and Abdominal Pain.  History of Present Illness:  Pt into the office for a CPE.  PAP - Last PAP done over 2 years ago. Pt has one son age 46.  She does desire another child with her current partner of 13 years.  Mammogram -  never had before.  no family history of breast cancer. No self breast exams at home  Optho - right eye blindness. needs referral to optho  Dental -  Psychosocial stress factors include major life changes.  The patient denies that she feels like life is not worth living, denies that she wishes that she were dead, and denies that she has thought about ending her life.         Depression Treatment History:  Prior Medication Used:   Start Date: Assessment of Effect:   Comments:  Zoloft (sertraline)     08/10/2010   side effects enough to stop   weight gain  Dyspepsia History:      She has no alarm features of dyspepsia including no history of melena, hematochezia, dysphagia, persistent vomiting, or involuntary weight loss > 5%.  There is a prior history of GERD.  The patient does not have a  prior history of documented ulcer disease.  The dominant symptom is not heartburn or acid reflux.  An H-2 blocker medication is currently being taken.  She notes that the symptoms have not improved with the H-2 blocker therapy.  Symptoms have not persisted after 4 weeks of H-2 blocker treatment.    Hypertension History:      She denies headache, chest pain, and palpitations.  She notes no problems with any antihypertensive medication side effects.        Positive major cardiovascular risk factors include hypertension and current tobacco user.  Negative major cardiovascular risk factors include female age less than 75 years old and no history of diabetes or hyperlipidemia.        Further assessment for target organ damage reveals no history of ASHD, cardiac end-organ damage (CHF/LVH), stroke/TIA, peripheral vascular disease, renal insufficiency, or hypertensive retinopathy.       Habits & Providers  Alcohol-Tobacco-Diet     Alcohol drinks/day: 0     Tobacco Status: current     Tobacco Counseling: to quit use of tobacco products     Cigarette Packs/Day: 0.25     Year Started: age 63  Exercise-Depression-Behavior     Does Patient Exercise: no  Depression Counseling: not indicated; screening negative for depression     Drug Use: marijuanna     Drug Use Counseling: done  Comments: PHQ-9 score = 22  Allergies (verified): No Known Drug Allergies  Social History: Does Patient Exercise:  no  Review of Systems General:  Denies fever. Eyes:  Complains of vision loss-1 eye; denies blurring. ENT:  Denies earache. CV:  Denies fatigue. Resp:  Denies cough. GI:  Denies abdominal pain. GU:  Denies discharge. MS:  Denies joint pain. Derm:  Denies dryness. Neuro:  Denies headaches. Psych:  Denies anxiety and depression.  Physical Exam  General:  alert.   Head:  normocephalic.   Eyes:  pupils round.   left reactive Ears:  bil TM with bony landmarks present Nose:  no nasal  discharge.   Mouth:  dentures Neck:  supple.   Chest Wall:  no mass.   Breasts:  no masses.   Lungs:  normal breath sounds.   Heart:  normal rate and regular rhythm.   Abdomen:  soft, non-tender, and normal bowel sounds.   Rectal:  no external abnormalities.   Msk:  normal ROM.   Pulses:  R radial normal and L radial normal.   Extremities:  no edema Neurologic:  alert & oriented X3.   Skin:  color normal.   right forefinger wart Psych:  Oriented X3.    Pelvic Exam  Vulva:      normal appearance.   Urethra and Bladder:      Urethra--normal.   Vagina:      curdlike discharge.   Cervix:      midposition.   Uterus:      smooth.   Adnexa:      nontender bilaterally.      Impression & Recommendations:  Problem # 1:  ROUTINE GYNECOLOGICAL EXAMINATION (ICD-V72.31) rec optho and dental exam PAP done PHQ-9 score = 22 Orders: KOH/ WET Mount [phone]) Pap Smear, Thin Prep ( Collection of) (U0454) Hemoccult Guaiac-1 spec.(in office) (82270) T- GC Chlamydia (09811) Vision Screening (91478)  Problem # 2:  UNSPECIFIED BREAST SCREENING (ICD-V76.10) self breast exam placcard given Orders: Mammogram (Screening) (Mammo)  Problem # 3:  HYPERTENSION, BENIGN ESSENTIAL (ICD-401.1) BP is elevated - need to adjust meds Her updated medication list for this problem includes:    Metoprolol Tartrate 50 Mg Tabs (Metoprolol tartrate) ..... One tablet by mouth twice a day    Lisinopril-hydrochlorothiazide 20-12.5 Mg Tabs (Lisinopril-hydrochlorothiazide) .Marland Kitchen... 2 tablets by mouth daily for blood pressure  Orders: T-Comprehensive Metabolic Panel (29562-13086) T-CBC w/Diff (57846-96295) Rapid HIV  (92370) T-Syphilis Test (RPR) (28413-24401) T-Urine Microalbumin w/creat. ratio [phone]) UA Dipstick w/o Micro (manual) (42595) EKG w/ Interpretation (93000)  Problem # 4:  WARTS, HAND (ICD-078.10) advised over the counter treatment  will refer to dermatology Orders: Dermatology  Referral (Derma)  Problem # 5:  TOBACCO ABUSE (ICD-305.1) ongoing. advised cessation  Problem # 6:  CANDIDIASIS (ICD-112.9)  Complete Medication List: 1)  Alprazolam 0.5 Mg Tabs (Alprazolam) .... One tablet by mouth two times a day as needed for nerves 2)  Metoprolol Tartrate 50 Mg Tabs (Metoprolol tartrate) .... One tablet by mouth twice a day 3)  Lisinopril-hydrochlorothiazide 20-12.5 Mg Tabs (Lisinopril-hydrochlorothiazide) .... 2 tablets by mouth daily for blood pressure 4)  Aspirin Childrens 81 Mg Chew (Aspirin) .... One tablet by mouth daily 5)  Ranitidine Hcl 150 Mg Tabs (Ranitidine hcl) .... One tablet by mouth before breakfast and before dinner 6)  Fluconazole 150 Mg Tabs (Fluconazole) .Marland KitchenMarland KitchenMarland Kitchen  One tablet by mouth x 1 dose  Other Orders: T-TSH (16109-60454) Ophthalmology Referral (Ophthalmology)  Hypertension Assessment/Plan:      The patient's hypertensive risk group is category B: At least one risk factor (excluding diabetes) with no target organ damage.  Her calculated 10 year risk of coronary heart disease is 6 %.  Today's blood pressure is 140/100.  Her blood pressure goal is < 140/90.  Patient Instructions: 1)  Right hand wart - apply the inside of the bananas to wart then cover with duct tape. You will need to do this for the past 2-3 weeks.  You will also be scheduled with dermatology at [facility] [address].  You will be notified of the time/date of the appointment.  Please do the above mentioned therapy first and it if resolves then you will not need dermatology so you can cancel the appointment. 2)  Blood pressure - still elevated today. 3)  Keep taking metoprolol twice daily. 4)  Will change lisinopril/ HCTZ to 20/12.5mg  - take TWO tablets by mouth daily (new prescription given) 5)  You will be informed of your lab results 6)  You will be referred for an eye exam. 7)  You have a yeast infection today.  Take fluconazole 150mg  x 1 dose (prescription given) 8)  Follow up in 4  weeks for triage visit for blood pressure check.  Come fasting (no food) but take your blood pressure medications with water.  Will need lipds.  Blood pressure goal is < 140/90 Prescriptions: FLUCONAZOLE 150 MG TABS (FLUCONAZOLE) One tablet by mouth x 1 dose  #1 x 0   Entered and Authorized by:   Lehman Prom FNP   Signed by:   Lehman Prom FNP on 01/19/2011   Method used:   Print then Give to Patient   RxID:   [id number] LISINOPRIL-HYDROCHLOROTHIAZIDE 20-12.5 MG TABS (LISINOPRIL-HYDROCHLOROTHIAZIDE) 2 tablets by mouth daily for blood pressure  #60 x 6   Entered and Authorized by:   Lehman Prom FNP   Signed by:   Lehman Prom FNP on 01/19/2011   Method used:   Print then Give to Patient   RxID:   [id number]    Orders Added: 1)  Est. Patient age 52-64 [99396] 2)  T-Comprehensive Metabolic Panel [80053-22900] 3)  T-CBC w/Diff [41324-40102] 4)  Rapid HIV  [92370] 5)  T-Syphilis Test (RPR) [72536-64403] 6)  T-TSH [47425-95638] 7)  T-Urine Microalbumin w/creat. ratio [82043-[address]] 8)  UA Dipstick w/o Micro (manual) [81002] 9)  KOH/ WET Mount [87210] 10)  Pap Smear, Thin Prep ( Collection of) [Q0091] 11)  Hemoccult Guaiac-1 spec.(in office) [82270] 12)  T- GC Chlamydia [75643] 13)  EKG w/ Interpretation [93000] 14)  Mammogram (Screening) [Mammo] 15)  Vision Screening [32951] 16)  Dermatology Referral [Derma] 17)  Ophthalmology Referral [Ophthalmology]    Laboratory Results   Urine Tests  Date/Time Received: January 19, 2011 12:00 PM   Routine Urinalysis   Color: lt. yellow Glucose: negative   (Normal Range: Negative) Bilirubin: negative   (Normal Range: Negative) Ketone: negative   (Normal Range: Negative) Spec. Gravity: 1.015   (Normal Range: 1.003-1.035) Blood: negative   (Normal Range: Negative) pH: 7.5   (Normal Range: 5.0-8.0) Protein: trace   (Normal Range: Negative) Urobilinogen: 0.2   (Normal Range: 0-1) Nitrite: negative    (Normal Range: Negative) Leukocyte Esterace: negative   (Normal Range: Negative)    Date/Time Received: January 19, 2011 12:00 PM   Wet Mount Source: vaginal WBC/hpf: 1-5 Bacteria/hpf: rare Clue cells/hpf:  none Yeast/hpf: none Wet Mount KOH: Negative Trichomonas/hpf: none  Stool - Occult Blood Hemmoccult #1: negative Date: 01/19/2011     EKG  Procedure date:  01/19/2011  Findings:      Sinus rhythm with 1st degree AV block

## 2011-01-25 ENCOUNTER — Ambulatory Visit (HOSPITAL_COMMUNITY)
Admission: RE | Admit: 2011-01-25 | Discharge: 2011-01-25 | Disposition: A | Discharge status: Home / Self Care | Payer: Self-pay | Source: Ambulatory Visit | Attending: Internal Medicine | Admitting: Internal Medicine

## 2011-01-25 DIAGNOSIS — Z1231 Encounter for screening mammogram for malignant neoplasm of breast: Secondary | ICD-10-CM | POA: Insufficient documentation

## 2011-01-30 ENCOUNTER — Other Ambulatory Visit: Payer: Self-pay | Admitting: Internal Medicine

## 2011-01-30 DIAGNOSIS — R928 Other abnormal and inconclusive findings on diagnostic imaging of breast: Secondary | ICD-10-CM

## 2011-02-01 NOTE — Progress Notes (Signed)
Summary: Lab/PAP results  Phone Note Outgoing Call   Summary of Call: Unable to send letter - notify pt that her labs and PAP done during recent office visit are normal. Initial call taken by: Lehman Prom FNP,  January 23, 2011 6:35 PM  Follow-up for Phone Call        pt informed of above information Follow-up by: Levon Hedger,  January 24, 2011 9:56 AM

## 2011-02-05 ENCOUNTER — Other Ambulatory Visit: Payer: Self-pay

## 2011-02-08 ENCOUNTER — Ambulatory Visit
Admission: RE | Admit: 2011-02-08 | Discharge: 2011-02-08 | Disposition: A | Discharge status: Home / Self Care | Payer: PRIVATE HEALTH INSURANCE | Source: Ambulatory Visit | Attending: Internal Medicine | Admitting: Internal Medicine

## 2011-02-08 DIAGNOSIS — R928 Other abnormal and inconclusive findings on diagnostic imaging of breast: Secondary | ICD-10-CM

## 2011-02-10 LAB — COMPREHENSIVE METABOLIC PANEL
Albumin: 3.6 g/dL (ref 3.5–5.2)
Alkaline Phosphatase: 66 U/L (ref 39–117)
BUN: 9 mg/dL (ref 6–23)
Chloride: 102 mEq/L (ref 96–112)
Creatinine, Ser: 0.78 mg/dL (ref 0.4–1.2)
Glucose, Bld: 114 mg/dL — ABNORMAL HIGH (ref 70–99)
Potassium: 3.7 mEq/L (ref 3.5–5.1)
Total Bilirubin: 0.5 mg/dL (ref 0.3–1.2)
Total Protein: 7.2 g/dL (ref 6.0–8.3)

## 2011-02-10 LAB — CBC
HCT: 40.3 % (ref 36.0–46.0)
MCH: 27.1 pg (ref 26.0–34.0)
MCV: 80.5 fL (ref 78.0–100.0)
Platelets: 290 10*3/uL (ref 150–400)
RDW: 16.2 % — ABNORMAL HIGH (ref 11.5–15.5)
WBC: 8.6 10*3/uL (ref 4.0–10.5)

## 2011-02-10 LAB — RPR: RPR Ser Ql: NONREACTIVE

## 2011-02-10 LAB — SEDIMENTATION RATE: Sed Rate: 30 mm/hr — ABNORMAL HIGH (ref 0–22)

## 2011-02-10 LAB — C-REACTIVE PROTEIN: CRP: 0.6 mg/dL — ABNORMAL HIGH (ref <0.6)

## 2011-02-11 LAB — CK TOTAL AND CKMB (NOT AT ARMC): Total CK: 92 U/L (ref 7–177)

## 2011-02-11 LAB — BASIC METABOLIC PANEL
BUN: 4 mg/dL — ABNORMAL LOW (ref 6–23)
Calcium: 9 mg/dL (ref 8.4–10.5)
GFR calc non Af Amer: >60 mL/min (ref >60)
Glucose, Bld: 116 mg/dL — ABNORMAL HIGH (ref 70–99)
Potassium: 3.8 mEq/L (ref 3.5–5.1)

## 2011-02-11 LAB — C-REACTIVE PROTEIN: CRP: 1.3 mg/dL — ABNORMAL HIGH (ref <0.6)

## 2011-02-11 LAB — LIPID PANEL
LDL Cholesterol: 92 mg/dL (ref 0–99)
Total CHOL/HDL Ratio: 4 RATIO
VLDL: 19 mg/dL (ref 0–40)

## 2011-02-11 LAB — PROTEIN C ACTIVITY: Protein C Activity: 138 % — ABNORMAL HIGH (ref 75–133)

## 2011-02-11 LAB — LUPUS ANTICOAGULANT PANEL
Lupus Anticoagulant: NOT DETECTED
PTT Lupus Anticoagulant: 40.8 secs (ref 30.0–45.6)

## 2011-02-11 LAB — CARDIOLIPIN ANTIBODIES, IGG, IGM, IGA
Anticardiolipin IgA: 2 APL U/mL — ABNORMAL LOW (ref <22)
Anticardiolipin IgG: 13 GPL U/mL (ref <23)

## 2011-02-11 LAB — PROTEIN S ACTIVITY: Protein S Activity: 79 % (ref 69–129)

## 2011-02-11 LAB — COMPREHENSIVE METABOLIC PANEL
Alkaline Phosphatase: 68 U/L (ref 39–117)
BUN: 4 mg/dL — ABNORMAL LOW (ref 6–23)
Chloride: 103 mEq/L (ref 96–112)
Creatinine, Ser: 0.67 mg/dL (ref 0.4–1.2)
Glucose, Bld: 102 mg/dL — ABNORMAL HIGH (ref 70–99)
Potassium: 3.7 mEq/L (ref 3.5–5.1)
Total Bilirubin: 0.5 mg/dL (ref 0.3–1.2)
Total Protein: 7.7 g/dL (ref 6.0–8.3)

## 2011-02-11 LAB — FACTOR 5 LEIDEN

## 2011-02-11 LAB — CBC
Hemoglobin: 13.6 g/dL (ref 12.0–15.0)
MCHC: 33 g/dL (ref 30.0–36.0)
MCHC: 33.3 g/dL (ref 30.0–36.0)
Platelets: 311 10*3/uL (ref 150–400)
RDW: 15.7 % — ABNORMAL HIGH (ref 11.5–15.5)
WBC: 8.6 10*3/uL (ref 4.0–10.5)
WBC: 9.9 10*3/uL (ref 4.0–10.5)

## 2011-02-11 LAB — APTT: aPTT: 26 seconds (ref 24–37)

## 2011-02-11 LAB — HOMOCYSTEINE: Homocysteine: 13.5 umol/L (ref 4.0–15.4)

## 2011-02-11 LAB — TSH: TSH: 3.287 u[IU]/mL (ref 0.350–4.500)

## 2011-02-11 LAB — FOLLICLE STIMULATING HORMONE: FSH: 9.2 m[IU]/mL

## 2011-02-11 LAB — DIFFERENTIAL
Basophils Absolute: 0 10*3/uL (ref 0.0–0.1)
Basophils Relative: 0 % (ref 0–1)
Lymphocytes Relative: 23 % (ref 12–46)
Neutro Abs: 7.1 10*3/uL (ref 1.7–7.7)
Neutrophils Relative %: 71 % (ref 43–77)

## 2011-02-11 LAB — SEDIMENTATION RATE: Sed Rate: 32 mm/hr — ABNORMAL HIGH (ref 0–22)

## 2011-02-11 LAB — PROLACTIN: Prolactin: 9.3 ng/mL

## 2011-02-11 LAB — PROTIME-INR
INR: 0.96 (ref 0.00–1.49)
Prothrombin Time: 12.7 seconds (ref 11.6–15.2)

## 2011-02-11 LAB — ANA: Anti Nuclear Antibody(ANA): NEGATIVE

## 2011-02-11 LAB — PROTHROMBIN GENE MUTATION

## 2011-02-11 LAB — BETA-2-GLYCOPROTEIN I ABS, IGG/M/A: Beta-2 Glyco I IgG: 1 G Units (ref <20)

## 2011-02-11 LAB — TROPONIN I: Troponin I: 0.01 ng/mL (ref 0.00–0.06)

## 2011-09-12 ENCOUNTER — Inpatient Hospital Stay (INDEPENDENT_AMBULATORY_CARE_PROVIDER_SITE_OTHER)
Admission: RE | Admit: 2011-09-12 | Discharge: 2011-09-12 | Disposition: A | Discharge status: Home / Self Care | Payer: PRIVATE HEALTH INSURANCE | Source: Ambulatory Visit | Attending: Emergency Medicine | Admitting: Emergency Medicine

## 2011-09-12 DIAGNOSIS — H6690 Otitis media, unspecified, unspecified ear: Secondary | ICD-10-CM

## 2012-12-03 ENCOUNTER — Emergency Department (HOSPITAL_COMMUNITY)
Admission: EM | Admit: 2012-12-03 | Discharge: 2012-12-03 | Disposition: A | Discharge status: Home / Self Care | Payer: No Typology Code available for payment source | Source: Home / Self Care

## 2012-12-03 ENCOUNTER — Encounter (HOSPITAL_COMMUNITY): Payer: Self-pay

## 2012-12-03 DIAGNOSIS — F17200 Nicotine dependence, unspecified, uncomplicated: Secondary | ICD-10-CM

## 2012-12-03 DIAGNOSIS — F411 Generalized anxiety disorder: Secondary | ICD-10-CM

## 2012-12-03 DIAGNOSIS — H5440 Blindness, one eye, unspecified eye: Secondary | ICD-10-CM

## 2012-12-03 DIAGNOSIS — I10 Essential (primary) hypertension: Secondary | ICD-10-CM

## 2012-12-03 DIAGNOSIS — K219 Gastro-esophageal reflux disease without esophagitis: Secondary | ICD-10-CM

## 2012-12-03 DIAGNOSIS — H547 Unspecified visual loss: Secondary | ICD-10-CM

## 2012-12-03 LAB — LIPID PANEL
HDL: 38 mg/dL — ABNORMAL LOW (ref >39)
LDL Cholesterol: 85 mg/dL (ref 0–99)
Triglycerides: 219 mg/dL — ABNORMAL HIGH (ref <150)
VLDL: 44 mg/dL — ABNORMAL HIGH (ref 0–40)

## 2012-12-03 LAB — COMPREHENSIVE METABOLIC PANEL
ALT: 15 U/L (ref 0–35)
AST: 15 U/L (ref 0–37)
Albumin: 3.9 g/dL (ref 3.5–5.2)
Calcium: 9.6 mg/dL (ref 8.4–10.5)
Chloride: 99 mEq/L (ref 96–112)
Creatinine, Ser: 0.83 mg/dL (ref 0.50–1.10)
Sodium: 136 mEq/L (ref 135–145)
Total Bilirubin: 0.2 mg/dL — ABNORMAL LOW (ref 0.3–1.2)

## 2012-12-03 LAB — CBC
MCHC: 32.9 g/dL (ref 30.0–36.0)
RDW: 15.8 % — ABNORMAL HIGH (ref 11.5–15.5)

## 2012-12-03 MED ORDER — ALPRAZOLAM 0.5 MG PO TABS: 0.5000 mg | ORAL_TABLET | Freq: Every evening | ORAL | Status: DC | PRN

## 2012-12-03 MED ORDER — METOPROLOL TARTRATE 50 MG PO TABS: 50.0000 mg | ORAL_TABLET | Freq: Two times a day (BID) | ORAL | Status: DC

## 2012-12-03 MED ORDER — LISINOPRIL-HYDROCHLOROTHIAZIDE 20-12.5 MG PO TABS: 1.0000 | ORAL_TABLET | Freq: Every day | ORAL | Status: DC

## 2012-12-03 MED ORDER — TRAMADOL HCL 50 MG PO TABS: 50.0000 mg | ORAL_TABLET | Freq: Four times a day (QID) | ORAL | Status: DC | PRN

## 2012-12-03 NOTE — ED Provider Notes (Signed)
History   CSN: [id number]  Arrival date & time 12/03/12  1112   Chief Complaint  Patient presents with  . Medication Refill   HPI This patient is presenting today to establish with the clinic and to get refills of her blood pressure medications.  She has uncontrolled hypertension because she's not taken her medications in quite some time.  She has had severe hypertension that has caused her to go acutely blind in the right eye this occurred approximately 3 years ago.  The patient also has not followed up with an eye care specialist in over 2 years.  She is unsure she has glaucoma.  The patient denies headache and chest pain at this time.  She denies shortness of breath.  She reports that she is willing to restart taking her medications.  She reports that she does have chronic episodes of cramping in the feet and legs.  She's not had any blood work done in several years.  History reviewed. No pertinent past medical history.  History reviewed. No pertinent past surgical history.  No family history on file.  History  Substance Use Topics  . Smoking status: Light Tobacco Smoker  . Smokeless tobacco: Not on file  . Alcohol Use: No    OB History    Grav Para Term Preterm Abortions TAB SAB Ect Mult Living                 Review of Systems  Constitutional: Negative.   HENT: Negative.   Respiratory: Negative.   Cardiovascular: Negative.   Gastrointestinal: Negative.   Musculoskeletal: Negative.   Neurological: Negative.   Hematological: Negative.   Psychiatric/Behavioral: Negative.     Allergies  Review of patient's allergies indicates no known allergies.  Home Medications   Current Outpatient Rx  Name  Route  Sig  Dispense  Refill  . ALPRAZOLAM 0.5 MG PO TABS   Oral   Take 0.5 mg by mouth at bedtime as needed.         . ASPIRIN 81 MG PO TBEC   Oral   Take 81 mg by mouth daily. Swallow whole.         Marland Kitchen LISINOPRIL-HYDROCHLOROTHIAZIDE 20-12.5 MG PO TABS   Oral   Take  1 tablet by mouth daily.         Marland Kitchen METOPROLOL TARTRATE 50 MG PO TABS   Oral   Take 50 mg by mouth 2 (two) times daily.         . TRAMADOL HCL 50 MG PO TABS   Oral   Take 50 mg by mouth every 6 (six) hours as needed.          BP 176/94  Pulse 60  Temp 98.8 F (37.1 C) (Oral)  Resp 20  SpO2 100%  LMP 11/26/2012  Physical Exam  Nursing note and vitals reviewed. Constitutional: She is oriented to person, place, and time. She appears well-developed and well-nourished. No distress.  HENT:  Head: Normocephalic and atraumatic.  Eyes: Right eye exhibits no discharge. Left eye exhibits no discharge. No scleral icterus.       Blind in right eye  Neck: Normal range of motion. Neck supple. No thyromegaly present.  Cardiovascular: Normal rate, regular rhythm and normal heart sounds.   Pulmonary/Chest: Effort normal and breath sounds normal. No respiratory distress. She has no wheezes. She exhibits no tenderness.  Abdominal: Soft. Bowel sounds are normal.  Musculoskeletal: Normal range of motion.  Neurological: She is alert and oriented to  person, place, and time.  Skin: Skin is warm and dry.  Psychiatric: She has a normal mood and affect. Her behavior is normal. Judgment and thought content normal.    ED Course  Procedures (including critical care time)  Labs Reviewed - No data to display No results found.  No diagnosis found.   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  The patient verbalized understanding.  The patient was told to call to get lab results if they haven't heard anything in the next week.            Cleora Fleet, MD 12/03/12 [phone]

## 2012-12-03 NOTE — ED Notes (Signed)
[facility] client in need of medication refill 

## 2012-12-08 ENCOUNTER — Telehealth (HOSPITAL_COMMUNITY): Payer: Self-pay

## 2012-12-08 NOTE — Telephone Encounter (Signed)
Message copied by Lestine Mount on Mon Dec 08, 2012  6:33 PM ------      Message from: Cleora Fleet      Created: Thu Dec 04, 2012 11:54 PM       Please notify patient that labs came back OK.             Rodney Langton, MD, CDE, FAAFP      [facility]      [facility]      [place], [place]

## 2013-04-24 ENCOUNTER — Telehealth: Payer: Self-pay | Admitting: Family Medicine

## 2013-04-24 ENCOUNTER — Ambulatory Visit: Payer: No Typology Code available for payment source | Attending: Family Medicine | Admitting: Family Medicine

## 2013-04-24 VITALS — BP 181/110 | HR 81 | Temp 98.4°F | Resp 19 | Wt 234.0 lb

## 2013-04-24 DIAGNOSIS — I10 Essential (primary) hypertension: Secondary | ICD-10-CM

## 2013-04-24 DIAGNOSIS — F411 Generalized anxiety disorder: Secondary | ICD-10-CM | POA: Insufficient documentation

## 2013-04-24 MED ORDER — LISINOPRIL-HYDROCHLOROTHIAZIDE 20-12.5 MG PO TABS: 1.0000 | ORAL_TABLET | Freq: Every day | ORAL | Status: DC

## 2013-04-24 MED ORDER — METOPROLOL TARTRATE 50 MG PO TABS: 50.0000 mg | ORAL_TABLET | Freq: Two times a day (BID) | ORAL | Status: DC

## 2013-04-24 NOTE — Progress Notes (Signed)
Patient here for medication refill Out of her blood pressure meds

## 2013-04-24 NOTE — Progress Notes (Signed)
Subjective:     Patient ID: Kendra Vega, female   DOB: 15-Nov-1971, 42 y.o.   MRN: [id number]  HPI Pt with long h/o htn here because she ran out of meds. Her son had a sz and has been in the hospital for 1 month, now with a trach. She is not sure what her BP was running when she was on her meds.   She denies chest pain, shob, or headache today. No other neuro complaints.   She was referred to [facility] for anxiety but lost their contact info.      Review of Systems per hpi     Objective:   Physical Exam  Nursing note and vitals reviewed. Constitutional: She appears well-developed and well-nourished.  Cardiovascular: Normal rate, regular rhythm and normal heart sounds.   Pulmonary/Chest: Effort normal and breath sounds normal.  Psychiatric: She has a normal mood and affect.       Assessment:     Essential hypertension, benign - Plan: lisinopril-hydrochlorothiazide (PRINZIDE,ZESTORETIC) 20-12.5 MG per tablet, metoprolol (LOPRESSOR) 50 MG tablet       Plan:     Refilled her meds. Will bring her back in 1 week for BP check.   rtc 2.5 mos for lab work and f/u on htn as well as other chronic med probs, earlier if needed.   Have given her the contact info for [facility].   She has followed up with optho regarding her eye.   Discussed red flags - go to ED if acutely worse or develops red flags such as neuro deficit or cp. Call with concerns or questions.Marland Kitchen

## 2013-05-01 ENCOUNTER — Ambulatory Visit: Payer: No Typology Code available for payment source

## 2013-07-30 ENCOUNTER — Ambulatory Visit: Payer: No Typology Code available for payment source | Attending: Internal Medicine | Admitting: Internal Medicine

## 2013-07-30 ENCOUNTER — Encounter: Payer: Self-pay | Admitting: Family Medicine

## 2013-07-30 VITALS — BP 173/105 | HR 74 | Temp 98.1°F | Resp 16 | Ht 66.14 in | Wt 234.0 lb

## 2013-07-30 DIAGNOSIS — F411 Generalized anxiety disorder: Secondary | ICD-10-CM | POA: Insufficient documentation

## 2013-07-30 DIAGNOSIS — F329 Major depressive disorder, single episode, unspecified: Secondary | ICD-10-CM

## 2013-07-30 DIAGNOSIS — F17200 Nicotine dependence, unspecified, uncomplicated: Secondary | ICD-10-CM

## 2013-07-30 DIAGNOSIS — E669 Obesity, unspecified: Secondary | ICD-10-CM

## 2013-07-30 DIAGNOSIS — K219 Gastro-esophageal reflux disease without esophagitis: Secondary | ICD-10-CM

## 2013-07-30 DIAGNOSIS — H547 Unspecified visual loss: Secondary | ICD-10-CM

## 2013-07-30 DIAGNOSIS — I10 Essential (primary) hypertension: Secondary | ICD-10-CM | POA: Insufficient documentation

## 2013-07-30 MED ORDER — ASPIRIN 81 MG PO TBEC: 81.0000 mg | DELAYED_RELEASE_TABLET | Freq: Every day | ORAL | Status: DC

## 2013-07-30 MED ORDER — LISINOPRIL-HYDROCHLOROTHIAZIDE 20-12.5 MG PO TABS: 1.0000 | ORAL_TABLET | Freq: Every day | ORAL | Status: DC

## 2013-07-30 MED ORDER — METOPROLOL TARTRATE 50 MG PO TABS: 50.0000 mg | ORAL_TABLET | Freq: Two times a day (BID) | ORAL | Status: DC

## 2013-07-30 MED ORDER — ALPRAZOLAM 0.5 MG PO TABS: 0.5000 mg | ORAL_TABLET | Freq: Every evening | ORAL | Status: DC | PRN

## 2013-07-30 MED ORDER — TRAMADOL HCL 50 MG PO TABS: 50.0000 mg | ORAL_TABLET | Freq: Four times a day (QID) | ORAL | Status: DC | PRN

## 2013-07-30 NOTE — Progress Notes (Signed)
Pt is here for follow up visit. Medication refill.

## 2013-07-30 NOTE — Progress Notes (Signed)
Patient ID: [name], female   DOB: [birth year], 42 y.o.   MRN: [id number] Patient Demographics  Kendra Vega, is a 42 y.o. female  WUJ:[id number]  [id number]  DOB - 1971-11-25  Chief Complaint  Patient presents with  . Follow-up        Subjective:   Kendra Vega today is here for a follow up visit. Patient has No headache, No chest pain, No abdominal pain - No Nausea, No new weakness tingling or numbness, No Cough - SOB.  Patient is having a lot of anxiety issues these days due to her son, Kendra Vega  being in the [facility], he has come off the life support, now has a trach. She states that she ran out of all her medications 4 days ago.  Objective:    Filed Vitals:   07/30/13 1642  BP: 173/105  Pulse: 74  Temp: 98.1 F (36.7 C)  TempSrc: Oral  Resp: 16  Height: 5' 6.14" (1.68 m)  Weight: 234 lb (106.142 kg)  SpO2: 99%     ALLERGIES:  No Known Allergies  PAST MEDICAL HISTORY: No past medical history on file.  MEDICATIONS AT HOME: Prior to Admission medications   Medication Sig Start Date End Date Taking? Authorizing Provider  ALPRAZolam Prudy Feeler) 0.5 MG tablet Take 1 tablet (0.5 mg total) by mouth at bedtime as needed for anxiety (caution may cause drowsiness, don't mix with alcohol or narcotic pain medication). 07/30/13  Yes Ripudeep Jenna Luo, MD  aspirin (ASPIRIN EC) 81 MG EC tablet Take 1 tablet (81 mg total) by mouth daily. Swallow whole. 07/30/13  Yes Ripudeep Jenna Luo, MD  lisinopril-hydrochlorothiazide (PRINZIDE,ZESTORETIC) 20-12.5 MG per tablet Take 1 tablet by mouth daily. 07/30/13  Yes Ripudeep Jenna Luo, MD  metoprolol (LOPRESSOR) 50 MG tablet Take 1 tablet (50 mg total) by mouth 2 (two) times daily. 07/30/13  Yes Ripudeep Jenna Luo, MD  traMADol (ULTRAM) 50 MG tablet Take 1 tablet (50 mg total) by mouth every 6 (six) hours as needed for pain. 07/30/13  Yes Ripudeep Jenna Luo, MD     Exam  General appearance :Awake, alert, NAD, Speech Clear.  HEENT: Atraumatic and  Normocephalic, PERLA Neck: supple, no JVD. No cervical lymphadenopathy.  Chest: Clear to auscultation bilaterally, no wheezing, rales or rhonchi CVS: S1 S2 regular, no murmurs.  Abdomen: soft, NBS, NT, ND, no gaurding, rigidity or rebound. Extremities: no cyanosis or clubbing, B/L Lower Ext shows no edema Neurology: Awake alert, and oriented X 3, CN II-XII intact, Non focal Skin: No Rash or lesions Wounds:N/A    Data Review   Basic Metabolic Panel: No results found for this basename: NA, K, CL, CO2, GLUCOSE, BUN, CREATININE, CALCIUM, MG, PHOS,  in the last 168 hours Liver Function Tests: No results found for this basename: AST, ALT, ALKPHOS, BILITOT, PROT, ALBUMIN,  in the last 168 hours  CBC: No results found for this basename: WBC, NEUTROABS, HGB, HCT, MCV, PLT,  in the last 168 hours  ------------------------------------------------------------------------------------------------------------------ No results found for this basename: HGBA1C,  in the last 72 hours ------------------------------------------------------------------------------------------------------------------ No results found for this basename: CHOL, HDL, LDLCALC, TRIG, CHOLHDL, LDLDIRECT,  in the last 72 hours ------------------------------------------------------------------------------------------------------------------ No results found for this basename: TSH, T4TOTAL, FREET3, T3FREE, THYROIDAB,  in the last 72 hours ------------------------------------------------------------------------------------------------------------------ No results found for this basename: VITAMINB12, FOLATE, FERRITIN, TIBC, IRON, RETICCTPCT,  in the last 72 hours  Coagulation profile  No results found for this basename: INR, PROTIME,  in the last 168 hours  Assessment & Plan   Active Problems: Accelerated hypertension: No symptoms of dizziness, blurred vision, chest pain or any shortness of breath - Restart lisinopril HCTZ,  metoprolol, refilled  Acute Anxiety: Due to family issues, showed mean the pictures of her son on the phone in the hospital - I have refilled Xanax for 20 pills with no refills and explained that she will not have any further refills in the clinic.   Health maintenance: Sent ambulatory referral for Pap smear, screening mammogram   Recommendations: Check lipid panel at the next visit Follow-up in 3 months     RAI,RIPUDEEP M.D. 07/30/2013, 5:15 PM

## 2013-08-03 ENCOUNTER — Ambulatory Visit: Payer: No Typology Code available for payment source

## 2013-09-18 ENCOUNTER — Encounter: Payer: No Typology Code available for payment source | Admitting: Obstetrics & Gynecology

## 2013-10-29 ENCOUNTER — Ambulatory Visit: Payer: No Typology Code available for payment source

## 2014-01-13 ENCOUNTER — Encounter (HOSPITAL_COMMUNITY): Payer: Self-pay | Admitting: Emergency Medicine

## 2014-01-13 ENCOUNTER — Emergency Department (INDEPENDENT_AMBULATORY_CARE_PROVIDER_SITE_OTHER)
Admission: EM | Admit: 2014-01-13 | Discharge: 2014-01-13 | Disposition: A | Discharge status: Home / Self Care | Payer: Self-pay | Source: Home / Self Care

## 2014-01-13 DIAGNOSIS — R519 Headache, unspecified: Secondary | ICD-10-CM

## 2014-01-13 DIAGNOSIS — R51 Headache: Secondary | ICD-10-CM

## 2014-01-13 DIAGNOSIS — I10 Essential (primary) hypertension: Secondary | ICD-10-CM

## 2014-01-13 HISTORY — DX: Essential (primary) hypertension: I10

## 2014-01-13 MED ORDER — METOPROLOL TARTRATE 50 MG PO TABS: 50.0000 mg | ORAL_TABLET | Freq: Two times a day (BID) | ORAL | Status: DC

## 2014-01-13 MED ORDER — LISINOPRIL-HYDROCHLOROTHIAZIDE 20-12.5 MG PO TABS: 1.0000 | ORAL_TABLET | Freq: Every day | ORAL | Status: DC

## 2014-01-13 NOTE — ED Notes (Signed)
No instructions available

## 2014-01-13 NOTE — ED Notes (Signed)
Reviewing instructions and script , patient immediately said she was going to ed for other medicine, said script presented was not going to do any good.  Patient stated going to ed now and left room immediately

## 2014-01-13 NOTE — Discharge Instructions (Signed)
Headaches, Frequently Asked Questions  MIGRAINE HEADACHES  Q: What is migraine? What causes it? How can I treat it?  A: Generally, migraine headaches begin as a dull ache. Then they develop into a constant, throbbing, and pulsating pain. You may experience pain at the temples. You may experience pain at the front or back of one or both sides of the head. The pain is usually accompanied by a combination of:   Nausea.   Vomiting.   Sensitivity to light and noise.  Some people (about 15%) experience an aura (see below) before an attack. The cause of migraine is believed to be chemical reactions in the brain. Treatment for migraine may include over-the-counter or prescription medications. It may also include self-help techniques. These include relaxation training and biofeedback.   Q: What is an aura?  A: About 15% of people with migraine get an "aura". This is a sign of neurological symptoms that occur before a migraine headache. You may see wavy or jagged lines, dots, or flashing lights. You might experience tunnel vision or blind spots in one or both eyes. The aura can include visual or auditory hallucinations (something imagined). It may include disruptions in smell (such as strange odors), taste or touch. Other symptoms include:   Numbness.   A "pins and needles" sensation.   Difficulty in recalling or speaking the correct word.  These neurological events may last as long as 60 minutes. These symptoms will fade as the headache begins.  Q: What is a trigger?  A: Certain physical or environmental factors can lead to or "trigger" a migraine. These include:   Foods.   Hormonal changes.   Weather.   Stress.  It is important to remember that triggers are different for everyone. To help prevent migraine attacks, you need to figure out which triggers affect you. Keep a headache diary. This is a good way to track triggers. The diary will help you talk to your healthcare professional about your condition.  Q: Does  weather affect migraines?  A: Bright sunshine, hot, humid conditions, and drastic changes in barometric pressure may lead to, or "trigger," a migraine attack in some people. But studies have shown that weather does not act as a trigger for everyone with migraines.  Q: What is the link between migraine and hormones?  A: Hormones start and regulate many of your body's functions. Hormones keep your body in balance within a constantly changing environment. The levels of hormones in your body are unbalanced at times. Examples are during menstruation, pregnancy, or menopause. That can lead to a migraine attack. In fact, about three quarters of all women with migraine report that their attacks are related to the menstrual cycle.   Q: Is there an increased risk of stroke for migraine sufferers?  A: The likelihood of a migraine attack causing a stroke is very remote. That is not to say that migraine sufferers cannot have a stroke associated with their migraines. In persons under age 40, the most common associated factor for stroke is migraine headache. But over the course of a person's normal life span, the occurrence of migraine headache may actually be associated with a reduced risk of dying from cerebrovascular disease due to stroke.   Q: What are acute medications for migraine?  A: Acute medications are used to treat the pain of the headache after it has started. Examples over-the-counter medications, NSAIDs, ergots, and triptans.   Q: What are the triptans?  A: Triptans are the newest class of   abortive medications. They are specifically targeted to treat migraine. Triptans are vasoconstrictors. They moderate some chemical reactions in the brain. The triptans work on receptors in your brain. Triptans help to restore the balance of a neurotransmitter called serotonin. Fluctuations in levels of serotonin are thought to be a main cause of migraine.   Q: Are over-the-counter medications for migraine effective?  A:  Over-the-counter, or "OTC," medications may be effective in relieving mild to moderate pain and associated symptoms of migraine. But you should see your caregiver before beginning any treatment regimen for migraine.   Q: What are preventive medications for migraine?  A: Preventive medications for migraine are sometimes referred to as "prophylactic" treatments. They are used to reduce the frequency, severity, and length of migraine attacks. Examples of preventive medications include antiepileptic medications, antidepressants, beta-blockers, calcium channel blockers, and NSAIDs (nonsteroidal anti-inflammatory drugs).  Q: Why are anticonvulsants used to treat migraine?  A: During the past few years, there has been an increased interest in antiepileptic drugs for the prevention of migraine. They are sometimes referred to as "anticonvulsants". Both epilepsy and migraine may be caused by similar reactions in the brain.   Q: Why are antidepressants used to treat migraine?  A: Antidepressants are typically used to treat people with depression. They may reduce migraine frequency by regulating chemical levels, such as serotonin, in the brain.   Q: What alternative therapies are used to treat migraine?  A: The term "alternative therapies" is often used to describe treatments considered outside the scope of conventional Western medicine. Examples of alternative therapy include acupuncture, acupressure, and yoga. Another common alternative treatment is herbal therapy. Some herbs are believed to relieve headache pain. Always discuss alternative therapies with your caregiver before proceeding. Some herbal products contain arsenic and other toxins.  TENSION HEADACHES  Q: What is a tension-type headache? What causes it? How can I treat it?  A: Tension-type headaches occur randomly. They are often the result of temporary stress, anxiety, fatigue, or anger. Symptoms include soreness in your temples, a tightening band-like sensation  around your head (a "vice-like" ache). Symptoms can also include a pulling feeling, pressure sensations, and contracting head and neck muscles. The headache begins in your forehead, temples, or the back of your head and neck. Treatment for tension-type headache may include over-the-counter or prescription medications. Treatment may also include self-help techniques such as relaxation training and biofeedback.  CLUSTER HEADACHES  Q: What is a cluster headache? What causes it? How can I treat it?  A: Cluster headache gets its name because the attacks come in groups. The pain arrives with little, if any, warning. It is usually on one side of the head. A tearing or bloodshot eye and a runny nose on the same side of the headache may also accompany the pain. Cluster headaches are believed to be caused by chemical reactions in the brain. They have been described as the most severe and intense of any headache type. Treatment for cluster headache includes prescription medication and oxygen.  SINUS HEADACHES  Q: What is a sinus headache? What causes it? How can I treat it?  A: When a cavity in the bones of the face and skull (a sinus) becomes inflamed, the inflammation will cause localized pain. This condition is usually the result of an allergic reaction, a tumor, or an infection. If your headache is caused by a sinus blockage, such as an infection, you will probably have a fever. An x-ray will confirm a sinus blockage. Your caregiver's   treatment might include antibiotics for the infection, as well as antihistamines or decongestants.   REBOUND HEADACHES  Q: What is a rebound headache? What causes it? How can I treat it?  A: A pattern of taking acute headache medications too often can lead to a condition known as "rebound headache." A pattern of taking too much headache medication includes taking it more than 2 days per week or in excessive amounts. That means more than the label or a caregiver advises. With rebound  headaches, your medications not only stop relieving pain, they actually begin to cause headaches. Doctors treat rebound headache by tapering the medication that is being overused. Sometimes your caregiver will gradually substitute a different type of treatment or medication. Stopping may be a challenge. Regularly overusing a medication increases the potential for serious side effects. Consult a caregiver if you regularly use headache medications more than 2 days per week or more than the label advises.  ADDITIONAL QUESTIONS AND ANSWERS  Q: What is biofeedback?  A: Biofeedback is a self-help treatment. Biofeedback uses special equipment to monitor your body's involuntary physical responses. Biofeedback monitors:   Breathing.   Pulse.   Heart rate.   Temperature.   Muscle tension.   Brain activity.  Biofeedback helps you refine and perfect your relaxation exercises. You learn to control the physical responses that are related to stress. Once the technique has been mastered, you do not need the equipment any more.  Q: Are headaches hereditary?  A: Four out of five (80%) of people that suffer report a family history of migraine. Scientists are not sure if this is genetic or a family predisposition. Despite the uncertainty, a child has a 50% chance of having migraine if one parent suffers. The child has a 75% chance if both parents suffer.   Q: Can children get headaches?  A: By the time they reach high school, most young people have experienced some type of headache. Many safe and effective approaches or medications can prevent a headache from occurring or stop it after it has begun.   Q: What type of doctor should I see to diagnose and treat my headache?  A: Start with your primary caregiver. Discuss his or her experience and approach to headaches. Discuss methods of classification, diagnosis, and treatment. Your caregiver may decide to recommend you to a headache specialist, depending upon your symptoms or other  physical conditions. Having diabetes, allergies, etc., may require a more comprehensive and inclusive approach to your headache. The [employer] will provide, upon request, a list of NHF physician members in your state.  Document Released: 02/02/2004 Document Revised: 02/04/2012 Document Reviewed: 07/12/2008  Exit[employer] Patient Information 2014 [employer]  Hypertension  As your heart beats, it forces blood through your arteries. This force is your blood pressure. If the pressure is too high, it is called hypertension (HTN) or high blood pressure. HTN is dangerous because you may have it and not know it. High blood pressure may mean that your heart has to work harder to pump blood. Your arteries may be narrow or stiff. The extra work puts you at risk for heart disease, stroke, and other problems.   Blood pressure consists of two numbers, a higher number over a lower, 110/72, for example. It is stated as "110 over 72." The ideal is below 120 for the top number (systolic) and under 80 for the bottom (diastolic). Write down your blood pressure today.  You should pay close attention to your blood   pressure if you have certain conditions such as:   Heart failure.   Prior heart attack.   Diabetes   Chronic kidney disease.   Prior stroke.   Multiple risk factors for heart disease.  To see if you have HTN, your blood pressure should be measured while you are seated with your arm held at the level of the heart. It should be measured at least twice. A one-time elevated blood pressure reading (especially in the Emergency Department) does not mean that you need treatment. There may be conditions in which the blood pressure is different between your right and left arms. It is important to see your caregiver soon for a recheck.  Most people have essential hypertension which means that there is not a specific cause. This type of high blood pressure may be lowered by changing lifestyle factors such  as:   Stress.   Smoking.   Lack of exercise.   Excessive weight.   Drug/tobacco/alcohol use.   Eating less salt.  Most people do not have symptoms from high blood pressure until it has caused damage to the body. Effective treatment can often prevent, delay or reduce that damage.  TREATMENT   When a cause has been identified, treatment for high blood pressure is directed at the cause. There are a large number of medications to treat HTN. These fall into several categories, and your caregiver will help you select the medicines that are best for you. Medications may have side effects. You should review side effects with your caregiver.  If your blood pressure stays high after you have made lifestyle changes or started on medicines,    Your medication(s) may need to be changed.   Other problems may need to be addressed.   Be certain you understand your prescriptions, and know how and when to take your medicine.   Be sure to follow up with your caregiver within the time frame advised (usually within two weeks) to have your blood pressure rechecked and to review your medications.   If you are taking more than one medicine to lower your blood pressure, make sure you know how and at what times they should be taken. Taking two medicines at the same time can result in blood pressure that is too low.  SEEK IMMEDIATE MEDICAL CARE IF:   You develop a severe headache, blurred or changing vision, or confusion.   You have unusual weakness or numbness, or a faint feeling.   You have severe chest or abdominal pain, vomiting, or breathing problems.  MAKE SURE YOU:    Understand these instructions.   Will watch your condition.   Will get help right away if you are not doing well or get worse.  Document Released: 11/12/2005 Document Revised: 02/04/2012 Document Reviewed: 07/02/2008  [employer] Patient Information 2014 [employer]

## 2014-01-13 NOTE — ED Notes (Signed)
Registration reported patient at front desk complaining.  Instructed registration to return patient to treatment room.  On return to treatment, explained patient's chart would be reviewed by dr Edgar Friskwiliamson--dr williamson reviewed chart and wrote for metoprolol.  Provided script to patient and patient left department after she apologized for behavior

## 2014-01-13 NOTE — ED Notes (Signed)
Patient ran out of blood pressure medicine x one week.  Patient has headache .  Requesting medication refill

## 2014-01-13 NOTE — ED Provider Notes (Signed)
Medical screening examination/treatment/procedure(s) were performed by resident physician or non-physician practitioner and as supervising physician I was immediately available for consultation/collaboration.   Barkley BrunsKINDL,JAMES DOUGLAS MD.   Linna HoffJames D Kindl, MD 01/13/14 2010

## 2014-01-13 NOTE — ED Provider Notes (Signed)
CSN: [id number]     Arrival date & time 01/13/14  1358 History   None    Chief Complaint  Patient presents with  . Hypertension  . Headache     (Consider location/radiation/quality/duration/timing/severity/associated sxs/prior Treatment) HPI Comments: As above, patient out of medications for several days. Her headache is chronic intermittent and is no different from her previous ones. 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 She denies other neurologic symptoms. She called her PCP and they told her that they were not accepting any more orange cards. [facility]   Past Medical History  Diagnosis Date  . Hypertension    History reviewed. No pertinent past surgical history. No family history on file. History  Substance Use Topics  . Smoking status: Light Tobacco Smoker  . Smokeless tobacco: Not on file  . Alcohol Use: No   OB History   Grav Para Term Preterm Abortions TAB SAB Ect Mult Living                 Review of Systems  Constitutional: Negative.   Respiratory: Negative.   Cardiovascular: Negative.   Genitourinary: Negative.   Skin: Negative.   Neurological: Positive for headaches.      Allergies  Review of patient's allergies indicates no known allergies.  Home Medications   Current Outpatient Rx  Name  Route  Sig  Dispense  Refill  . ALPRAZolam (XANAX) 0.5 MG tablet   Oral   Take 1 tablet (0.5 mg total) by mouth at bedtime as needed for anxiety (caution may cause drowsiness, don't mix with alcohol or narcotic pain medication).   20 tablet   0   . aspirin (ASPIRIN EC) 81 MG EC tablet   Oral   Take 1 tablet (81 mg total) by mouth daily. Swallow whole.   30 tablet   5   . lisinopril-hydrochlorothiazide (PRINZIDE,ZESTORETIC) 20-12.5 MG per tablet   Oral   Take 1 tablet by mouth daily.   30 tablet   5   . metoprolol (LOPRESSOR) 50 MG tablet   Oral   Take 1 tablet (50 mg total)  by mouth 2 (two) times daily.   60 tablet   4   . traMADol (ULTRAM) 50 MG tablet   Oral   Take 1 tablet (50 mg total) by mouth every 6 (six) hours as needed for pain.   60 tablet   1   . lisinopril-hydrochlorothiazide (PRINZIDE) 20-12.5 MG per tablet   Oral   Take 1 tablet by mouth daily.   30 tablet   0    BP 172/117  Pulse 70  Temp(Src) 98 F (36.7 C) (Oral)  Resp 18  SpO2 99%  LMP 01/01/2014 Physical Exam  Nursing note and vitals reviewed. Constitutional: She is oriented to person, place, and time. She appears well-developed and well-nourished. No distress.  Morbidly obese  Neck: Normal range of motion. Neck supple.  Cardiovascular: Normal rate, regular rhythm and normal heart sounds.   Pulmonary/Chest: Effort normal and breath sounds normal. No respiratory distress. She has no wheezes.  Musculoskeletal: She exhibits no edema.  Neurological: She is alert and oriented to person, place, and time.  Skin: Skin is warm and dry.  Psychiatric: She has a normal mood and affect.    ED Course  Procedures (including critical care time) Labs Review Labs Reviewed - No data to display Imaging Review No results found.  MDM   Final diagnoses:  HTN (hypertension)  Headache    Must f/u with your PCP, [facility] RF zestoretic 20/12.5    Hayden Rasmussenavid Lequisha Cammack, NP 01/13/14 1718

## 2014-04-29 ENCOUNTER — Ambulatory Visit: Payer: Self-pay | Attending: Internal Medicine

## 2014-07-09 ENCOUNTER — Encounter: Payer: Self-pay | Admitting: Internal Medicine

## 2014-07-09 ENCOUNTER — Ambulatory Visit: Payer: Self-pay | Attending: Internal Medicine | Admitting: Internal Medicine

## 2014-07-09 VITALS — BP 167/114 | HR 90 | Temp 98.9°F | Resp 18 | Ht 66.0 in | Wt 233.6 lb

## 2014-07-09 DIAGNOSIS — L02239 Carbuncle of trunk, unspecified: Secondary | ICD-10-CM | POA: Insufficient documentation

## 2014-07-09 DIAGNOSIS — Z1239 Encounter for other screening for malignant neoplasm of breast: Secondary | ICD-10-CM

## 2014-07-09 DIAGNOSIS — F411 Generalized anxiety disorder: Secondary | ICD-10-CM | POA: Insufficient documentation

## 2014-07-09 DIAGNOSIS — L02229 Furuncle of trunk, unspecified: Secondary | ICD-10-CM | POA: Insufficient documentation

## 2014-07-09 DIAGNOSIS — H5440 Blindness, one eye, unspecified eye: Secondary | ICD-10-CM | POA: Insufficient documentation

## 2014-07-09 DIAGNOSIS — I10 Essential (primary) hypertension: Secondary | ICD-10-CM | POA: Insufficient documentation

## 2014-07-09 DIAGNOSIS — Z7982 Long term (current) use of aspirin: Secondary | ICD-10-CM | POA: Insufficient documentation

## 2014-07-09 DIAGNOSIS — Z8249 Family history of ischemic heart disease and other diseases of the circulatory system: Secondary | ICD-10-CM | POA: Insufficient documentation

## 2014-07-09 DIAGNOSIS — F17200 Nicotine dependence, unspecified, uncomplicated: Secondary | ICD-10-CM | POA: Insufficient documentation

## 2014-07-09 DIAGNOSIS — F1210 Cannabis abuse, uncomplicated: Secondary | ICD-10-CM | POA: Insufficient documentation

## 2014-07-09 LAB — COMPLETE METABOLIC PANEL WITH GFR
ALT: 15 U/L (ref 0–35)
AST: 17 U/L (ref 0–37)
Albumin: 4.4 g/dL (ref 3.5–5.2)
Alkaline Phosphatase: 64 U/L (ref 39–117)
BILIRUBIN TOTAL: 0.3 mg/dL (ref 0.2–1.2)
BUN: 9 mg/dL (ref 6–23)
CALCIUM: 9.3 mg/dL (ref 8.4–10.5)
CO2: 27 meq/L (ref 19–32)
CREATININE: 1.3 mg/dL — AB (ref 0.50–1.10)
Chloride: 100 mEq/L (ref 96–112)
GFR, EST AFRICAN AMERICAN: 58 mL/min — AB
GFR, Est Non African American: 50 mL/min — ABNORMAL LOW
Glucose, Bld: 100 mg/dL — ABNORMAL HIGH (ref 70–99)
Potassium: 4.1 mEq/L (ref 3.5–5.3)
Sodium: 138 mEq/L (ref 135–145)
Total Protein: 7.7 g/dL (ref 6.0–8.3)

## 2014-07-09 MED ORDER — METOPROLOL TARTRATE 50 MG PO TABS: 50.0000 mg | ORAL_TABLET | Freq: Two times a day (BID) | ORAL | Status: DC

## 2014-07-09 MED ORDER — PAROXETINE HCL 10 MG PO TABS: 10.0000 mg | ORAL_TABLET | Freq: Every day | ORAL | Status: DC

## 2014-07-09 MED ORDER — CLONIDINE HCL 0.1 MG PO TABS
0.1000 mg | ORAL_TABLET | Freq: Once | ORAL | Status: AC
  Administered 2014-07-09: 0.1 mg via ORAL

## 2014-07-09 MED ORDER — LISINOPRIL-HYDROCHLOROTHIAZIDE 20-12.5 MG PO TABS: 1.0000 | ORAL_TABLET | Freq: Every day | ORAL | Status: DC

## 2014-07-09 NOTE — Patient Instructions (Signed)
DASH Eating Plan DASH stands for "Dietary Approaches to Stop Hypertension." The DASH eating plan is a healthy eating plan that has been shown to reduce high blood pressure (hypertension). Additional health benefits may include reducing the risk of type 2 diabetes mellitus, heart disease, and stroke. The DASH eating plan may also help with weight loss. WHAT DO I NEED TO KNOW ABOUT THE DASH EATING PLAN? For the DASH eating plan, you will follow these general guidelines:  Choose foods with a percent daily value for sodium of less than 5% (as listed on the food label).  Use salt-free seasonings or herbs instead of table salt or sea salt.  Check with your health care provider or pharmacist before using salt substitutes.  Eat lower-sodium products, often labeled as "lower sodium" or "no salt added."  Eat fresh foods.  Eat more vegetables, fruits, and low-fat dairy products.  Choose whole grains. Look for the word "whole" as the first word in the ingredient list.  Choose fish and skinless chicken or turkey more often than red meat. Limit fish, poultry, and meat to 6 oz (170 g) each day.  Limit sweets, desserts, sugars, and sugary drinks.  Choose heart-healthy fats.  Limit cheese to 1 oz (28 g) per day.  Eat more home-cooked food and less restaurant, buffet, and fast food.  Limit fried foods.  Cook foods using methods other than frying.  Limit canned vegetables. If you do use them, rinse them well to decrease the sodium.  When eating at a restaurant, ask that your food be prepared with less salt, or no salt if possible. WHAT FOODS CAN I EAT? Seek help from a dietitian for individual calorie needs. Grains Whole grain or whole wheat bread. Brown rice. Whole grain or whole wheat pasta. Quinoa, bulgur, and whole grain cereals. Low-sodium cereals. Corn or whole wheat flour tortillas. Whole grain cornbread. Whole grain crackers. Low-sodium crackers. Vegetables Fresh or frozen vegetables  (raw, steamed, roasted, or grilled). Low-sodium or reduced-sodium tomato and vegetable juices. Low-sodium or reduced-sodium tomato sauce and paste. Low-sodium or reduced-sodium canned vegetables.  Fruits All fresh, canned (in natural juice), or frozen fruits. Meat and Other Protein Products Ground beef (85% or leaner), grass-fed beef, or beef trimmed of fat. Skinless chicken or turkey. Ground chicken or turkey. Pork trimmed of fat. All fish and seafood. Eggs. Dried beans, peas, or lentils. Unsalted nuts and seeds. Unsalted canned beans. Dairy Low-fat dairy products, such as skim or 1% milk, 2% or reduced-fat cheeses, low-fat ricotta or cottage cheese, or plain low-fat yogurt. Low-sodium or reduced-sodium cheeses. Fats and Oils Tub margarines without trans fats. Light or reduced-fat mayonnaise and salad dressings (reduced sodium). Avocado. Safflower, olive, or canola oils. Natural peanut or almond butter. Other Unsalted popcorn and pretzels. The items listed above may not be a complete list of recommended foods or beverages. Contact your dietitian for more options. WHAT FOODS ARE NOT RECOMMENDED? Grains White bread. White pasta. White rice. Refined cornbread. Bagels and croissants. Crackers that contain trans fat. Vegetables Creamed or fried vegetables. Vegetables in a cheese sauce. Regular canned vegetables. Regular canned tomato sauce and paste. Regular tomato and vegetable juices. Fruits Dried fruits. Canned fruit in light or heavy syrup. Fruit juice. Meat and Other Protein Products Fatty cuts of meat. Ribs, chicken wings, bacon, sausage, bologna, salami, chitterlings, fatback, hot dogs, bratwurst, and packaged luncheon meats. Salted nuts and seeds. Canned beans with salt. Dairy Whole or 2% milk, cream, half-and-half, and cream cheese. Whole-fat or sweetened yogurt. Full-fat   cheeses or blue cheese. Nondairy creamers and whipped toppings. Processed cheese, cheese spreads, or cheese  curds. Condiments Onion and garlic salt, seasoned salt, table salt, and sea salt. Canned and packaged gravies. Worcestershire sauce. Tartar sauce. Barbecue sauce. Teriyaki sauce. Soy sauce, including reduced sodium. Steak sauce. Fish sauce. Oyster sauce. Cocktail sauce. Horseradish. Ketchup and mustard. Meat flavorings and tenderizers. Bouillon cubes. Hot sauce. Tabasco sauce. Marinades. Taco seasonings. Relishes. Fats and Oils Butter, stick margarine, lard, shortening, ghee, and bacon fat. Coconut, palm kernel, or palm oils. Regular salad dressings. Other Pickles and olives. Salted popcorn and pretzels. The items listed above may not be a complete list of foods and beverages to avoid. Contact your dietitian for more information. WHERE CAN I FIND MORE INFORMATION? National Heart, Lung, and Blood Institute: www.nhlbi.nih.gov/health/health-topics/topics/dash/ Document Released: 11/01/2011 Document Revised: 03/29/2014 Document Reviewed: 09/16/2013 ExitCare Patient Information 2015 [employer]. This information is not intended to replace advice given to you by your health care provider. Make sure you discuss any questions you have with your health care provider. Smoking Cessation Quitting smoking is important to your health and has many advantages. However, it is not always easy to quit since nicotine is a very addictive drug. Oftentimes, people try 3 times or more before being able to quit. This document explains the best ways for you to prepare to quit smoking. Quitting takes hard work and a lot of effort, but you can do it. ADVANTAGES OF QUITTING SMOKING  You will live longer, feel better, and live better.  Your body will feel the impact of quitting smoking almost immediately.  Within 20 minutes, blood pressure decreases. Your pulse returns to its normal level.  After 8 hours, carbon monoxide levels in the blood return to normal. Your oxygen level increases.  After 24 hours, the chance of  having a heart attack starts to decrease. Your breath, hair, and body stop smelling like smoke.  After 48 hours, damaged nerve endings begin to recover. Your sense of taste and smell improve.  After 72 hours, the body is virtually free of nicotine. Your bronchial tubes relax and breathing becomes easier.  After 2 to 12 weeks, lungs can hold more air. Exercise becomes easier and circulation improves.  The risk of having a heart attack, stroke, cancer, or lung disease is greatly reduced.  After 1 year, the risk of coronary heart disease is cut in half.  After 5 years, the risk of stroke falls to the same as a nonsmoker.  After 10 years, the risk of lung cancer is cut in half and the risk of other cancers decreases significantly.  After 15 years, the risk of coronary heart disease drops, usually to the level of a nonsmoker.  If you are pregnant, quitting smoking will improve your chances of having a healthy baby.  The people you live with, especially any children, will be healthier.  You will have extra money to spend on things other than cigarettes. QUESTIONS TO THINK ABOUT BEFORE ATTEMPTING TO QUIT You may want to talk about your answers with your health care provider.  Why do you want to quit?  If you tried to quit in the past, what helped and what did not?  What will be the most difficult situations for you after you quit? How will you plan to handle them?  Who can help you through the tough times? Your family? Friends? A health care provider?  What pleasures do you get from smoking? What ways can you still get pleasure   if you quit? Here are some questions to ask your health care provider:  How can you help me to be successful at quitting?  What medicine do you think would be best for me and how should I take it?  What should I do if I need more help?  What is smoking withdrawal like? How can I get information on withdrawal? GET READY  Set a quit date.  Change your  environment by getting rid of all cigarettes, ashtrays, matches, and lighters in your home, car, or work. Do not let people smoke in your home.  Review your past attempts to quit. Think about what worked and what did not. GET SUPPORT AND ENCOURAGEMENT You have a better chance of being successful if you have help. You can get support in many ways.  Tell your family, friends, and coworkers that you are going to quit and need their support. Ask them not to smoke around you.  Get individual, group, or telephone counseling and support. Programs are available at local hospitals and health centers. Call your local health department for information about programs in your area.  Spiritual beliefs and practices may help some smokers quit.  Download a "quit meter" on your computer to keep track of quit statistics, such as how long you have gone without smoking, cigarettes not smoked, and money saved.  Get a self-help book about quitting smoking and staying off tobacco. LEARN NEW SKILLS AND BEHAVIORS  Distract yourself from urges to smoke. Talk to someone, go for a walk, or occupy your time with a task.  Change your normal routine. Take a different route to work. Drink tea instead of coffee. Eat breakfast in a different place.  Reduce your stress. Take a hot bath, exercise, or read a book.  Plan something enjoyable to do every day. Reward yourself for not smoking.  Explore interactive web-based programs that specialize in helping you quit. GET MEDICINE AND USE IT CORRECTLY Medicines can help you stop smoking and decrease the urge to smoke. Combining medicine with the above behavioral methods and support can greatly increase your chances of successfully quitting smoking.  Nicotine replacement therapy helps deliver nicotine to your body without the negative effects and risks of smoking. Nicotine replacement therapy includes nicotine gum, lozenges, inhalers, nasal sprays, and skin patches. Some may be  available over-the-counter and others require a prescription.  Antidepressant medicine helps people abstain from smoking, but how this works is unknown. This medicine is available by prescription.  Nicotinic receptor partial agonist medicine simulates the effect of nicotine in your brain. This medicine is available by prescription. Ask your health care provider for advice about which medicines to use and how to use them based on your health history. Your health care provider will tell you what side effects to look out for if you choose to be on a medicine or therapy. Carefully read the information on the package. Do not use any other product containing nicotine while using a nicotine replacement product.  RELAPSE OR DIFFICULT SITUATIONS Most relapses occur within the first 3 months after quitting. Do not be discouraged if you start smoking again. Remember, most people try several times before finally quitting. You may have symptoms of withdrawal because your body is used to nicotine. You may crave cigarettes, be irritable, feel very hungry, cough often, get headaches, or have difficulty concentrating. The withdrawal symptoms are only temporary. They are strongest when you first quit, but they will go away within 10-14 days. To reduce the   chances of relapse, try to:  Avoid drinking alcohol. Drinking lowers your chances of successfully quitting.  Reduce the amount of caffeine you consume. Once you quit smoking, the amount of caffeine in your body increases and can give you symptoms, such as a rapid heartbeat, sweating, and anxiety.  Avoid smokers because they can make you want to smoke.  Do not let weight gain distract you. Many smokers will gain weight when they quit, usually less than 10 pounds. Eat a healthy diet and stay active. You can always lose the weight gained after you quit.  Find ways to improve your mood other than smoking. FOR MORE INFORMATION  www.smokefree.gov  Document Released:  11/06/2001 Document Revised: 03/29/2014 Document Reviewed: 02/21/2012 ExitCare Patient Information 2015 [employer]. This information is not intended to replace advice given to you by your health care provider. Make sure you discuss any questions you have with your health care provider.  

## 2014-07-09 NOTE — Progress Notes (Signed)
Patient ID: Kendra Vega, female   DOB: [birth year], 43 y.o.   MRN: [id number]  CC: HTN  HPI:  Patient presents to clinic today for a follow up on HTN.  She states that she has been out of her BP medication for three months.  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  She reports that she is under so much stress due to her 44 years old son that is disabled with a seizure disorder.  She reports that she gets little sleep due to staying up at night to make sure he does not have a seizure.  She reports that she has been depressed for a long time.  She is very tearful during exam. Reports that she had a boil on her right breast for one month and it is very painful.  She admits that she still smokes a pack of cigarettes every three days and she does not eat properly.    No Known Allergies Past Medical History  Diagnosis Date  . Hypertension    Current Outpatient Prescriptions on File Prior to Visit  Medication Sig Dispense Refill  . ALPRAZolam (XANAX) 0.5 MG tablet Take 1 tablet (0.5 mg total) by mouth at bedtime as needed for anxiety (caution may cause drowsiness, don't mix with alcohol or narcotic pain medication).  20 tablet  0  . aspirin (ASPIRIN EC) 81 MG EC tablet Take 1 tablet (81 mg total) by mouth daily. Swallow whole.  30 tablet  5  . lisinopril-hydrochlorothiazide (PRINZIDE) 20-12.5 MG per tablet Take 1 tablet by mouth daily.  30 tablet  0  . metoprolol (LOPRESSOR) 50 MG tablet Take 1 tablet (50 mg total) by mouth 2 (two) times daily.  60 tablet  5  . traMADol (ULTRAM) 50 MG tablet Take 1 tablet (50 mg total) by mouth every 6 (six) hours as needed for pain.  60 tablet  1  . lisinopril-hydrochlorothiazide (PRINZIDE,ZESTORETIC) 20-12.5 MG per tablet Take 1 tablet by mouth daily.  30 tablet  5   No current facility-administered medications on file prior to visit.   Family History  Problem Relation Age of Onset  . Diabetes Mother   . Hypertension  Mother   . Hypertension Sister    History   Social History  . Marital Status: Single    Spouse Name: N/A    Number of Children: N/A  . Years of Education: N/A   Occupational History  . Not on file.   Social History Main Topics  . Smoking status: Light Tobacco Smoker  . Smokeless tobacco: Not on file  . Alcohol Use: No  . Drug Use: 1.00 per week    Special: Marijuana  . Sexual Activity: Not on file   Other Topics Concern  . Not on file   Social History Narrative  . No narrative on file   Review of Systems  Eyes:       Blind in right eye   Cardiovascular: Positive for leg swelling.  Neurological: Positive for headaches.     Objective:   Filed Vitals:   07/09/14 1142  BP: 174/114  Pulse: 90  Temp: 98.9 F (37.2 C)  Resp: 18   Physical Exam  Constitutional: She is oriented to person, place, and time.  Cardiovascular: Normal rate, regular rhythm and normal heart sounds.   Pulmonary/Chest: Effort normal and breath sounds normal.  Abdominal: Soft. Bowel sounds are normal.  Neurological: She is alert and oriented  to person, place, and time.  Skin: Skin is warm and dry.     Lab Results  Component Value Date   WBC 9.5 12/03/2012   HGB 13.4 12/03/2012   HCT 40.7 12/03/2012   MCV 80.9 12/03/2012   PLT 336 12/03/2012   Lab Results  Component Value Date   CREATININE 0.83 12/03/2012   BUN 12 12/03/2012   NA 136 12/03/2012   K 4.0 12/03/2012   CL 99 12/03/2012   CO2 27 12/03/2012    Lab Results  Component Value Date   HGBA1C  Value: 5.8 (NOTE)                                                                       According to the ADA Clinical Practice Recommendations for 2011, when HbA1c is used as a screening test:   >=6.5%   Diagnostic of Diabetes Mellitus           (if abnormal result  is confirmed)  5.7-6.4%   Increased risk of developing Diabetes Mellitus  References:Diagnosis and Classification of Diabetes Mellitus,Diabetes Care,2011,34(Suppl 1):S62-S69 and Standards of  Medical Care in         Diabetes - 2011,Diabetes Care,2011,34  (Suppl 1):S11-S61.* 06/07/2010   Lipid Panel     Component Value Date/Time   CHOL 167 12/03/2012 1147   TRIG 219* 12/03/2012 1147   HDL 38* 12/03/2012 1147   CHOLHDL 4.4 12/03/2012 1147   VLDL 44* 12/03/2012 1147   LDLCALC 85 12/03/2012 1147       Assessment and plan:   Pollyanna was seen today for recurrent skin infections and hypertension.  Diagnoses and associated orders for this visit:  Essential hypertension, benign - cloNIDine (CATAPRES) tablet 0.1 mg; Take 1 tablet (0.1 mg total) by mouth once. - COMPLETE METABOLIC PANEL WITH GFR - lisinopril-hydrochlorothiazide (PRINZIDE,ZESTORETIC) 20-12.5 MG per tablet; Take 1 tablet by mouth daily. - metoprolol (LOPRESSOR) 50 MG tablet; Take 1 tablet (50 mg total) by mouth 2 (two) times daily.  Anxiety state, unspecified - PARoxetine (PAXIL) 10 MG tablet; Take 1 tablet (10 mg total) by mouth daily.  Tobacco use disorder Smoking cessation discussed Breast cancer screening - MM Digital Screening; Future   Return for [place] next week, 1 week BP check, 6 weeks for anxiety.       Holland Commons, NP-C [facility] [phone] 07/09/2014, 12:06 PM

## 2014-07-09 NOTE — Progress Notes (Signed)
Check HTN/ no medication for the past 3 weeks Pt complain of pain on rt breast poss boil lower area. Had cyst two years ago.

## 2014-07-15 ENCOUNTER — Ambulatory Visit: Payer: Self-pay | Attending: Internal Medicine | Admitting: *Deleted

## 2014-07-15 ENCOUNTER — Ambulatory Visit (HOSPITAL_BASED_OUTPATIENT_CLINIC_OR_DEPARTMENT_OTHER): Payer: Self-pay | Admitting: *Deleted

## 2014-07-15 VITALS — BP 158/95 | HR 69

## 2014-07-15 DIAGNOSIS — F3289 Other specified depressive episodes: Secondary | ICD-10-CM

## 2014-07-15 DIAGNOSIS — I10 Essential (primary) hypertension: Secondary | ICD-10-CM

## 2014-07-15 DIAGNOSIS — F329 Major depressive disorder, single episode, unspecified: Secondary | ICD-10-CM

## 2014-07-15 NOTE — Progress Notes (Signed)
Patient stated that she was feeling depressed and had low mood due to several life stressors.  LCS[facility]    Beverly Sessionsywan J Jakiera Ehler MSW, LCSW

## 2014-07-15 NOTE — Patient Instructions (Signed)
Hydrochlorothiazide, HCTZ; Lisinopril tablets What is this medicine? HYDROCHLOROTHIAZIDE; LISINOPRIL (hye droe klor oh THYE a zide; lyse IN oh pril) is a combination of a diuretic and an ACE inhibitor. It is used to treat high blood pressure. This medicine may be used for other purposes; ask your health care provider or pharmacist if you have questions. COMMON BRAND NAME(S): Prinzide, Zestoretic What should I tell my health care provider before I take this medicine? They need to know if you have any of these conditions: -bone marrow disease -decreased urine -heart or blood vessel disease -if you are on a special diet like a low salt diet -immune system problems, like lupus -kidney disease -liver disease -previous swelling of the tongue, face, or lips with difficulty breathing, difficulty swallowing, hoarseness, or tightening of the throat -recent heart attack or stroke -an unusual or allergic reaction to lisinopril, hydrochlorothiazide, sulfa drugs, other medicines, insect venom, foods, dyes, or preservatives -pregnant or trying to get pregnant -breast-feeding How should I use this medicine? Take this medicine by mouth with a glass of water. Follow the directions on the prescription label. You can take it with or without food. If it upsets your stomach, take it with food. Take your medicine at regular intervals. Do not take it more often than directed. Do not stop taking except on your doctor's advice. Talk to your pediatrician regarding the use of this medicine in children. Special care may be needed. Overdosage: If you think you have taken too much of this medicine contact a poison control center or emergency room at once. NOTE: This medicine is only for you. Do not share this medicine with others. What if I miss a dose? If you miss a dose, take it as soon as you can. If it is almost time for your next dose, take only that dose. Do not take double or extra doses. What may interact with  this medicine? -barbiturates like phenobarbital -blood pressure medicines -corticosteroids like prednisone -diabetic medications -diuretics, especially triamterene, spironolactone or amiloride -lithium -NSAIDs like ibuprofen -potassium salts or potassium supplements -prescription pain medicines -skeletal muscle relaxants like tubocurarine -some cholesterol lowering medications like cholestyramine or colestipol This list may not describe all possible interactions. Give your health care provider a list of all the medicines, herbs, non-prescription drugs, or dietary supplements you use. Also tell them if you smoke, drink alcohol, or use illegal drugs. Some items may interact with your medicine. What should I watch for while using this medicine? Visit your doctor or health care professional for regular checks on your progress. Check your blood pressure as directed. Ask your doctor or health care professional what your blood pressure should be and when you should contact him or her. Call your doctor or health care professional if you notice an irregular or fast heart beat. You must not get dehydrated. Ask your doctor or health care professional how much fluid you need to drink a day. Check with him or her if you get an attack of severe diarrhea, nausea and vomiting, or if you sweat a lot. The loss of too much body fluid can make it dangerous for you to take this medicine. Women should inform their doctor if they wish to become pregnant or think they might be pregnant. There is a potential for serious side effects to an unborn child. Talk to your health care professional or pharmacist for more information. You may get drowsy or dizzy. Do not drive, use machinery, or do anything that needs mental alertness until   you know how this drug affects you. Do not stand or sit up quickly, especially if you are an older patient. This reduces the risk of dizzy or fainting spells. Alcohol can make you more drowsy and  dizzy. Avoid alcoholic drinks. This medicine may affect your blood sugar level. If you have diabetes, check with your doctor or health care professional before changing the dose of your diabetic medicine. Avoid salt substitutes unless you are told otherwise by your doctor or health care professional. This medicine can make you more sensitive to the sun. Keep out of the sun. If you cannot avoid being in the sun, wear protective clothing and use sunscreen. Do not use sun lamps or tanning beds/booths. Do not treat yourself for coughs, colds, or pain while you are taking this medicine without asking your doctor or health care professional for advice. Some ingredients may increase your blood pressure. What side effects may I notice from receiving this medicine? Side effects that you should report to your doctor or health care professional as soon as possible: -changes in vision -confusion, dizziness, light headedness or fainting spells -decreased amount of urine passed -difficulty breathing or swallowing, hoarseness, or tightening of the throat -eye pain -fast or irregular heart beat, palpitations, or chest pain -muscle cramps -nausea and vomiting -persistent dry cough -redness, blistering, peeling or loosening of the skin, including inside the mouth -stomach pain -swelling of your face, lips, tongue, hands, or feet -unusual rash, bleeding or bruising, or pinpoint red spots on the skin -worsened gout pain -yellowing of the eyes or skin Side effects that usually do not require medical attention (report to your doctor or health care professional if they continue or are bothersome): -change in sex drive or performance -cough -headache This list may not describe all possible side effects. Call your doctor for medical advice about side effects. You may report side effects to FDA at 1-800-FDA-1088. Where should I keep my medicine? Keep out of the reach of children. Store at room temperature between  20 and 25 degrees C (68 and 77 degrees F). Protect from moisture and excessive light. Keep container tightly closed. Throw away any unused medicine after the expiration date. NOTE: This sheet is a summary. It may not cover all possible information. If you have questions about this medicine, talk to your doctor, pharmacist, or health care provider.  2015, Elsevier/Gold Standard. (2010-08-02 13:33:52) Metoprolol tablets What is this medicine? METOPROLOL (me TOE proe lole) is a beta-blocker. Beta-blockers reduce the workload on the heart and help it to beat more regularly. This medicine is used to treat high blood pressure and to prevent chest pain. It is also used to after a heart attack and to prevent an additional heart attack from occurring. This medicine may be used for other purposes; ask your health care provider or pharmacist if you have questions. COMMON BRAND NAME(S): Lopressor What should I tell my health care provider before I take this medicine? They need to know if you have any of these conditions: -diabetes -heart or vessel disease like slow heart rate, worsening heart failure, heart block, sick sinus syndrome or Raynaud's disease -kidney disease -liver disease -lung or breathing disease, like asthma or emphysema -pheochromocytoma -thyroid disease -an unusual or allergic reaction to metoprolol, other beta-blockers, medicines, foods, dyes, or preservatives -pregnant or trying to get pregnant -breast-feeding How should I use this medicine? Take this medicine by mouth with a drink of water. Follow the directions on the prescription label. Take this medicine immediately  after meals. Take your doses at regular intervals. Do not take more medicine than directed. Do not stop taking this medicine suddenly. This could lead to serious heart-related effects. Talk to your pediatrician regarding the use of this medicine in children. Special care may be needed. Overdosage: If you think you have  taken too much of this medicine contact a poison control center or emergency room at once. NOTE: This medicine is only for you. Do not share this medicine with others. What if I miss a dose? If you miss a dose, take it as soon as you can. If it is almost time for your next dose, take only that dose. Do not take double or extra doses. What may interact with this medicine? This medicine may interact with the following medications: -certain medicines for blood pressure, heart disease, irregular heart beat -certain medicines for depression like monoamine oxidase (MAO) inhibitors, fluoxetine, or paroxetine -clonidine -dobutamine -epinephrine -isoproterenol -reserpine This list may not describe all possible interactions. Give your health care provider a list of all the medicines, herbs, non-prescription drugs, or dietary supplements you use. Also tell them if you smoke, drink alcohol, or use illegal drugs. Some items may interact with your medicine. What should I watch for while using this medicine? Visit your doctor or health care professional for regular check ups. Contact your doctor right away if your symptoms worsen. Check your blood pressure and pulse rate regularly. Ask your health care professional what your blood pressure and pulse rate should be, and when you should contact them. You may get drowsy or dizzy. Do not drive, use machinery, or do anything that needs mental alertness until you know how this medicine affects you. Do not sit or stand up quickly, especially if you are an older patient. This reduces the risk of dizzy or fainting spells. Contact your doctor if these symptoms continue. Alcohol may interfere with the effect of this medicine. Avoid alcoholic drinks. What side effects may I notice from receiving this medicine? Side effects that you should report to your doctor or health care professional as soon as possible: -allergic reactions like skin rash, itching or hives -cold or  numb hands or feet -depression -difficulty breathing -faint -fever with sore throat -irregular heartbeat, chest pain -rapid weight gain -swollen legs or ankles Side effects that usually do not require medical attention (report to your doctor or health care professional if they continue or are bothersome): -anxiety or nervousness -change in sex drive or performance -dry skin -headache -nightmares or trouble sleeping -short term memory loss -stomach upset or diarrhea -unusually tired This list may not describe all possible side effects. Call your doctor for medical advice about side effects. You may report side effects to FDA at 1-800-FDA-1088. Where should I keep my medicine? Keep out of the reach of children. Store at room temperature between 15 and 30 degrees C (59 and 86 degrees F). Throw away any unused medicine after the expiration date. NOTE: This sheet is a summary. It may not cover all possible information. If you have questions about this medicine, talk to your doctor, pharmacist, or health care provider.  2015, Elsevier/Gold Standard. (2013-07-17 14:40:36)

## 2014-07-19 ENCOUNTER — Telehealth: Payer: Self-pay | Admitting: *Deleted

## 2014-07-19 DIAGNOSIS — Z0000 Encounter for general adult medical examination without abnormal findings: Secondary | ICD-10-CM

## 2014-07-19 NOTE — Telephone Encounter (Signed)
Pt aware of lab results, will make apt for lab test CMP.

## 2014-07-19 NOTE — Telephone Encounter (Signed)
Message copied by Dyann Kief on Mon Jul 19, 2014 12:09 PM ------      Message from: Holland Commons A      Created: Tue Jul 13, 2014 10:45 PM       Please let patient know that her kidney function is elevated. Please make sure patient patient is avoid nsaids.  Have patient to repeat cmp with gfr in one month ------

## 2014-08-03 ENCOUNTER — Ambulatory Visit: Payer: Self-pay

## 2014-08-19 ENCOUNTER — Ambulatory Visit: Payer: Self-pay | Attending: Internal Medicine

## 2014-08-19 DIAGNOSIS — Z0000 Encounter for general adult medical examination without abnormal findings: Secondary | ICD-10-CM

## 2014-08-19 LAB — COMPLETE METABOLIC PANEL WITH GFR
ALBUMIN: 3.7 g/dL (ref 3.5–5.2)
ALK PHOS: 73 U/L (ref 39–117)
ALT: 12 U/L (ref 0–35)
AST: 13 U/L (ref 0–37)
BUN: 12 mg/dL (ref 6–23)
CHLORIDE: 102 meq/L (ref 96–112)
CO2: 25 mEq/L (ref 19–32)
Calcium: 9.9 mg/dL (ref 8.4–10.5)
Creat: 1.54 mg/dL — ABNORMAL HIGH (ref 0.50–1.10)
GFR, Est African American: 47 mL/min — ABNORMAL LOW
GFR, Est Non African American: 41 mL/min — ABNORMAL LOW
Glucose, Bld: 102 mg/dL — ABNORMAL HIGH (ref 70–99)
POTASSIUM: 4.4 meq/L (ref 3.5–5.3)
Sodium: 136 mEq/L (ref 135–145)
Total Bilirubin: 0.2 mg/dL (ref 0.2–1.2)
Total Protein: 7 g/dL (ref 6.0–8.3)

## 2014-08-26 ENCOUNTER — Ambulatory Visit: Payer: Self-pay | Admitting: Internal Medicine

## 2014-08-30 ENCOUNTER — Ambulatory Visit: Payer: Self-pay | Attending: Internal Medicine | Admitting: Internal Medicine

## 2014-08-30 ENCOUNTER — Encounter: Payer: Self-pay | Admitting: Internal Medicine

## 2014-08-30 VITALS — BP 126/83 | HR 61 | Temp 98.2°F | Resp 18 | Ht 66.0 in | Wt 236.0 lb

## 2014-08-30 DIAGNOSIS — Z720 Tobacco use: Secondary | ICD-10-CM | POA: Insufficient documentation

## 2014-08-30 DIAGNOSIS — R7989 Other specified abnormal findings of blood chemistry: Secondary | ICD-10-CM

## 2014-08-30 DIAGNOSIS — R944 Abnormal results of kidney function studies: Secondary | ICD-10-CM | POA: Insufficient documentation

## 2014-08-30 DIAGNOSIS — Z7982 Long term (current) use of aspirin: Secondary | ICD-10-CM | POA: Insufficient documentation

## 2014-08-30 DIAGNOSIS — I10 Essential (primary) hypertension: Secondary | ICD-10-CM | POA: Insufficient documentation

## 2014-08-30 DIAGNOSIS — F419 Anxiety disorder, unspecified: Secondary | ICD-10-CM | POA: Insufficient documentation

## 2014-08-30 DIAGNOSIS — F411 Generalized anxiety disorder: Secondary | ICD-10-CM

## 2014-08-30 DIAGNOSIS — Z79899 Other long term (current) drug therapy: Secondary | ICD-10-CM | POA: Insufficient documentation

## 2014-08-30 DIAGNOSIS — F17200 Nicotine dependence, unspecified, uncomplicated: Secondary | ICD-10-CM

## 2014-08-30 DIAGNOSIS — R748 Abnormal levels of other serum enzymes: Secondary | ICD-10-CM

## 2014-08-30 DIAGNOSIS — Z8249 Family history of ischemic heart disease and other diseases of the circulatory system: Secondary | ICD-10-CM | POA: Insufficient documentation

## 2014-08-30 MED ORDER — ALPRAZOLAM 0.5 MG PO TABS: 0.5000 mg | ORAL_TABLET | Freq: Every evening | ORAL | Status: DC | PRN

## 2014-08-30 NOTE — Progress Notes (Signed)
Patient ID: Kendra Vega, female   DOB: [birth year], 43 y.o.   MRN: [id number]  CC: anxiety, HTN  HPI:  Patient reports that she is compliant with medication management.  Patient reports that she stopped taking the Paxil two days ago due to nightmares she was experiencing with the medication.  Patient states that her anxiety has not improved since the Paxil.  Patient states that she has been more stressed since her boyfriend started back on drugs and went to jail. Patient continues to smoke but states she is attempting to cut back.   She reports a history of a stroke four years ago.   No Known Allergies Past Medical History  Diagnosis Date  . Hypertension    Current Outpatient Prescriptions on File Prior to Visit  Medication Sig Dispense Refill  . lisinopril-hydrochlorothiazide (PRINZIDE,ZESTORETIC) 20-12.5 MG per tablet Take 1 tablet by mouth daily.  30 tablet  4  . metoprolol (LOPRESSOR) 50 MG tablet Take 1 tablet (50 mg total) by mouth 2 (two) times daily.  60 tablet  4  . PARoxetine (PAXIL) 10 MG tablet Take 1 tablet (10 mg total) by mouth daily.  30 tablet  3  . ALPRAZolam (XANAX) 0.5 MG tablet Take 1 tablet (0.5 mg total) by mouth at bedtime as needed for anxiety (caution may cause drowsiness, don't mix with alcohol or narcotic pain medication).  20 tablet  0  . aspirin (ASPIRIN EC) 81 MG EC tablet Take 1 tablet (81 mg total) by mouth daily. Swallow whole.  30 tablet  5  . traMADol (ULTRAM) 50 MG tablet Take 1 tablet (50 mg total) by mouth every 6 (six) hours as needed for pain.  60 tablet  1   No current facility-administered medications on file prior to visit.   Family History  Problem Relation Age of Onset  . Diabetes Mother   . Hypertension Mother   . Hypertension Sister    History   Social History  . Marital Status: Single    Spouse Name: N/A    Number of Children: N/A  . Years of Education: N/A   Occupational History  . Not on file.   Social History Main Topics  .  Smoking status: Light Tobacco Smoker  . Smokeless tobacco: Not on file  . Alcohol Use: No  . Drug Use: 1.00 per week    Special: Marijuana  . Sexual Activity: Not on file   Other Topics Concern  . Not on file   Social History Narrative  . No narrative on file    Review of Systems  Constitutional: Negative for fever and chills.  Respiratory: Negative.   Cardiovascular: Negative.   Neurological: Negative for dizziness, tingling and headaches.  Psychiatric/Behavioral: Negative for depression. The patient is nervous/anxious.       Objective:   Filed Vitals:   08/30/14 1235  BP: 126/83  Pulse: 61  Temp: 98.2 F (36.8 C)  Resp: 18   Physical Exam  Constitutional: She is oriented to person, place, and time.  Cardiovascular: Normal rate, regular rhythm and normal heart sounds.   Pulmonary/Chest: Effort normal and breath sounds normal.  Musculoskeletal: Normal range of motion. She exhibits no edema and no tenderness.  Neurological: She is alert and oriented to person, place, and time. She has normal reflexes.  Skin: Skin is warm and dry.  Psychiatric: She has a normal mood and affect.     Lab Results  Component Value Date   WBC 9.5 12/03/2012   HGB 13.4  12/03/2012   HCT 40.7 12/03/2012   MCV 80.9 12/03/2012   PLT 336 12/03/2012   Lab Results  Component Value Date   CREATININE 1.54* 08/19/2014   BUN 12 08/19/2014   NA 136 08/19/2014   K 4.4 08/19/2014   CL 102 08/19/2014   CO2 25 08/19/2014    Lab Results  Component Value Date   HGBA1C  Value: 5.8 (NOTE)                                                                       According to the ADA Clinical Practice Recommendations for 2011, when HbA1c is used as a screening test:   >=6.5%   Diagnostic of Diabetes Mellitus           (if abnormal result  is confirmed)  5.7-6.4%   Increased risk of developing Diabetes Mellitus  References:Diagnosis and Classification of Diabetes Mellitus,Diabetes Care,2011,34(Suppl 1):S62-S69 and  Standards of Medical Care in         Diabetes - 2011,Diabetes Care,2011,34  (Suppl 1):S11-S61.* 06/07/2010   Lipid Panel     Component Value Date/Time   CHOL 167 12/03/2012 1147   TRIG 219* 12/03/2012 1147   HDL 38* 12/03/2012 1147   CHOLHDL 4.4 12/03/2012 1147   VLDL 44* 12/03/2012 1147   LDLCALC 85 12/03/2012 1147       Assessment and plan:   Kendra IvanLiz was seen today for follow-up and anxiety.  Diagnoses and associated orders for this visit:  Essential hypertension BP well controlled on current therapy - Lipid panel; Future Patient will need statin therapy as well, will make decision after lipid panel results  Elevated serum creatinine - Ambulatory referral to Nephrology Explained that she may continue 81 mg ASA therapy for stroke prophylaxis--consulted with Dr. Sidney AceFunchess. Anxiety state - ALPRAZolam (XANAX) 0.5 MG tablet; Take 1 tablet (0.5 mg total) by mouth at bedtime as needed for anxiety (caution may cause drowsiness, don't mix with alcohol or narcotic pain medication). Explained that this will not be a long term drug or cyclic refill med.  [facility]  She refused zoloft due to side effects   Tobacco use disorder Discussed. Will address at each visit. Complications and risk for cardiovascular and strokes higher with tobacco use.   Return for this week , Lab Visit and 3 mo PCP.     Holland CommonsKECK, VALERIE, NP-C [facility] [phone] 08/30/2014, 12:51 PM

## 2014-08-30 NOTE — Progress Notes (Signed)
Pt comes for f/u HTN with medication management Requesting medication change of Paxil due to nightmares/bad dreams BP controlled with meds 126/83 64

## 2014-08-30 NOTE — Patient Instructions (Signed)
Stroke Prevention Some medical conditions and behaviors are associated with an increased chance of having a stroke. You may prevent a stroke by making healthy choices and managing medical conditions. HOW CAN I REDUCE MY RISK OF HAVING A STROKE?   Stay physically active. Get at least 30 minutes of activity on most or all days.  Do not smoke. It may also be helpful to avoid exposure to secondhand smoke.  Limit alcohol use. Moderate alcohol use is considered to be:  No more than 2 drinks per day for men.  No more than 1 drink per day for nonpregnant women.  Eat healthy foods. This involves:  Eating 5 or more servings of fruits and vegetables a day.  Making dietary changes that address high blood pressure (hypertension), high cholesterol, diabetes, or obesity.  Manage your cholesterol levels.  Making food choices that are high in fiber and low in saturated fat, trans fat, and cholesterol may control cholesterol levels.  Take any prescribed medicines to control cholesterol as directed by your health care provider.  Manage your diabetes.  Controlling your carbohydrate and sugar intake is recommended to manage diabetes.  Take any prescribed medicines to control diabetes as directed by your health care provider.  Control your hypertension.  Making food choices that are low in salt (sodium), saturated fat, trans fat, and cholesterol is recommended to manage hypertension.  Take any prescribed medicines to control hypertension as directed by your health care provider.  Maintain a healthy weight.  Reducing calorie intake and making food choices that are low in sodium, saturated fat, trans fat, and cholesterol are recommended to manage weight.  Stop drug abuse.  Avoid taking birth control pills.  Talk to your health care provider about the risks of taking birth control pills if you are over 35 years old, smoke, get migraines, or have ever had a blood clot.  Get evaluated for sleep  disorders (sleep apnea).  Talk to your health care provider about getting a sleep evaluation if you snore a lot or have excessive sleepiness.  Take medicines only as directed by your health care provider.  For some people, aspirin or blood thinners (anticoagulants) are helpful in reducing the risk of forming abnormal blood clots that can lead to stroke. If you have the irregular heart rhythm of atrial fibrillation, you should be on a blood thinner unless there is a good reason you cannot take them.  Understand all your medicine instructions.  Make sure that other conditions (such as anemia or atherosclerosis) are addressed. SEEK IMMEDIATE MEDICAL CARE IF:   You have sudden weakness or numbness of the face, arm, or leg, especially on one side of the body.  Your face or eyelid droops to one side.  You have sudden confusion.  You have trouble speaking (aphasia) or understanding.  You have sudden trouble seeing in one or both eyes.  You have sudden trouble walking.  You have dizziness.  You have a loss of balance or coordination.  You have a sudden, severe headache with no known cause.  You have new chest pain or an irregular heartbeat. Any of these symptoms may represent a serious problem that is an emergency. Do not wait to see if the symptoms will go away. Get medical help at once. Call your local emergency services (911 in [place] Do not drive yourself to the hospital. Document Released: 12/20/2004 Document Revised: 03/29/2014 Document Reviewed: 05/15/2013 ExitCare Patient Information 2015 [employer]. This information is not intended to replace advice given   to you by your health care provider. Make sure you discuss any questions you have with your health care provider.  

## 2014-09-06 ENCOUNTER — Ambulatory Visit: Payer: Self-pay | Attending: Internal Medicine

## 2014-09-06 DIAGNOSIS — I10 Essential (primary) hypertension: Secondary | ICD-10-CM

## 2014-09-06 LAB — LIPID PANEL
Cholesterol: 174 mg/dL (ref 0–200)
HDL: 33 mg/dL — ABNORMAL LOW (ref >39)
LDL Cholesterol: 91 mg/dL (ref 0–99)
TRIGLYCERIDES: 251 mg/dL — AB (ref <150)
Total CHOL/HDL Ratio: 5.3 Ratio
VLDL: 50 mg/dL — AB (ref 0–40)

## 2014-09-30 ENCOUNTER — Telehealth: Payer: Self-pay | Admitting: *Deleted

## 2014-09-30 MED ORDER — ATORVASTATIN CALCIUM 10 MG PO TABS: 10.0000 mg | ORAL_TABLET | Freq: Every day | ORAL | Status: DC

## 2014-09-30 NOTE — Telephone Encounter (Signed)
-----   Message from Ambrose FinlandValerie A Keck, NP sent at 09/14/2014  2:07 PM EDT ----- Please call patient and let her know that her triglycerides are still elevated. Please explained to patient diet control that will help with triglyceride levels. Please explained to patient all does cholesterol is is not elevated do to her history of hypertension and smoking she would benefit from a low-dose statin therapy daily. Please send patient atorvastatin 10 mg to take daily. Please explained to patient if she begins to have muscle aches after taking a medication called office back.

## 2014-09-30 NOTE — Telephone Encounter (Signed)
Message left on patient's mobile VM to return call to discuss lab results Atorvastatin 10 mg 1 tab po daily # 30 with 2 refills [facility]

## 2014-12-08 ENCOUNTER — Other Ambulatory Visit: Payer: Self-pay | Admitting: Internal Medicine

## 2014-12-14 ENCOUNTER — Other Ambulatory Visit: Payer: Self-pay | Admitting: Internal Medicine

## 2015-02-17 ENCOUNTER — Ambulatory Visit: Payer: Self-pay | Attending: Internal Medicine | Admitting: Internal Medicine

## 2015-02-17 ENCOUNTER — Encounter: Payer: Self-pay | Admitting: Internal Medicine

## 2015-02-17 VITALS — BP 162/109 | HR 72 | Temp 98.9°F | Resp 16 | Ht 66.0 in | Wt 231.0 lb

## 2015-02-17 DIAGNOSIS — N611 Abscess of the breast and nipple: Secondary | ICD-10-CM

## 2015-02-17 DIAGNOSIS — L02818 Cutaneous abscess of other sites: Secondary | ICD-10-CM | POA: Insufficient documentation

## 2015-02-17 DIAGNOSIS — N61 Inflammatory disorders of breast: Secondary | ICD-10-CM

## 2015-02-17 DIAGNOSIS — F419 Anxiety disorder, unspecified: Secondary | ICD-10-CM

## 2015-02-17 MED ORDER — SULFAMETHOXAZOLE-TRIMETHOPRIM 800-160 MG PO TABS: 1.0000 | ORAL_TABLET | Freq: Two times a day (BID) | ORAL | Status: DC

## 2015-02-17 MED ORDER — ALPRAZOLAM 0.5 MG PO TABS: 0.5000 mg | ORAL_TABLET | Freq: Every evening | ORAL | Status: DC | PRN

## 2015-02-17 MED ORDER — FLUCONAZOLE 150 MG PO TABS: 150.0000 mg | ORAL_TABLET | Freq: Once | ORAL | Status: DC

## 2015-02-17 MED ORDER — TRAMADOL HCL 50 MG PO TABS: 50.0000 mg | ORAL_TABLET | Freq: Two times a day (BID) | ORAL | Status: DC | PRN

## 2015-02-17 NOTE — Patient Instructions (Signed)

## 2015-02-17 NOTE — Progress Notes (Signed)
Patient here to have right breast abscess checked She says this has been a chronic problem for years Patient requesting pap next visit

## 2015-02-17 NOTE — Progress Notes (Signed)
Patient ID: Kendra Vega, female   DOB: Jun 02, 1971, 44 y.o.   MRN: [id number]  CC: breast abscess  HPI: Kendra Vega is a 44 y.o. female here today for a follow up visit.  Patient has past medical history of hypertension. She presents today with a abscess of the right breast that has been a chronic problem. She reports that this abscess has been present for several years. Her last mammogram was in 2012 because she has not had insurance to afford the procedure. She c/o of persistent anxiety due to boyfriend issues. She stopped taking SSRI due to side effects. She would like a refill of her xanax.   Patient has No headache, No chest pain, No abdominal pain - No Nausea, No new weakness tingling or numbness, No Cough - SOB.  No Known Allergies Past Medical History  Diagnosis Date  . Hypertension    Current Outpatient Prescriptions on File Prior to Visit  Medication Sig Dispense Refill  . aspirin (ASPIRIN EC) 81 MG EC tablet Take 1 tablet (81 mg total) by mouth daily. Swallow whole. 30 tablet 5  . lisinopril-hydrochlorothiazide (PRINZIDE,ZESTORETIC) 20-12.5 MG per tablet TAKE 1 TABLET BY MOUTH DAILY. 30 tablet 4  . metoprolol (LOPRESSOR) 50 MG tablet TAKE 1 TABLET BY MOUTH 2 TIMES DAILY. 60 tablet 4  . ALPRAZolam (XANAX) 0.5 MG tablet Take 1 tablet (0.5 mg total) by mouth at bedtime as needed for anxiety (caution may cause drowsiness, don't mix with alcohol or narcotic pain medication). (Patient not taking: Reported on 02/17/2015) 30 tablet 0  . atorvastatin (LIPITOR) 10 MG tablet Take 1 tablet (10 mg total) by mouth daily. (Patient not taking: Reported on 02/17/2015) 30 tablet 2  . traMADol (ULTRAM) 50 MG tablet Take 1 tablet (50 mg total) by mouth every 6 (six) hours as needed for pain. (Patient not taking: Reported on 02/17/2015) 60 tablet 1   No current facility-administered medications on file prior to visit.   Family History  Problem Relation Age of Onset  . Diabetes Mother   . Hypertension  Mother   . Hypertension Sister    History   Social History  . Marital Status: Single    Spouse Name: N/A  . Number of Children: N/A  . Years of Education: N/A   Occupational History  . Not on file.   Social History Main Topics  . Smoking status: Heavy Tobacco Smoker -- 0.50 packs/day  . Smokeless tobacco: Not on file  . Alcohol Use: No  . Drug Use: 1.00 per week    Special: Marijuana  . Sexual Activity: Not on file   Other Topics Concern  . Not on file   Social History Narrative    Review of Systems: See HPI   Objective:   Filed Vitals:   02/17/15 1222  BP: 162/109  Pulse: 72  Temp: 98.9 F (37.2 C)  Resp: 16    Physical Exam  Constitutional: She is oriented to person, place, and time.  Cardiovascular: Normal rate, regular rhythm and normal heart sounds.   Pulmonary/Chest: Effort normal and breath sounds normal. Right breast exhibits no mass. Left breast exhibits no mass.    Genitourinary: No breast tenderness.  Neurological: She is alert and oriented to person, place, and time.  Psychiatric: She has a normal mood and affect.     Lab Results  Component Value Date   WBC 9.5 12/03/2012   HGB 13.4 12/03/2012   HCT 40.7 12/03/2012   MCV 80.9 12/03/2012   PLT 336  12/03/2012   Lab Results  Component Value Date   CREATININE 1.54* 08/19/2014   BUN 12 08/19/2014   NA 136 08/19/2014   K 4.4 08/19/2014   CL 102 08/19/2014   CO2 25 08/19/2014    Lab Results  Component Value Date   HGBA1C * 06/07/2010    5.8 (NOTE)                                                                       According to the ADA Clinical Practice Recommendations for 2011, when HbA1c is used as a screening test:   >=6.5%   Diagnostic of Diabetes Mellitus           (if abnormal result  is confirmed)  5.7-6.4%   Increased risk of developing Diabetes Mellitus  References:Diagnosis and Classification of Diabetes Mellitus,Diabetes Care,2011,34(Suppl 1):S62-S69 and Standards of  Medical Care in         Diabetes - 2011,Diabetes Care,2011,34  (Suppl 1):S11-S61.   Lipid Panel     Component Value Date/Time   CHOL 174 09/06/2014 1028   TRIG 251* 09/06/2014 1028   HDL 33* 09/06/2014 1028   CHOLHDL 5.3 09/06/2014 1028   VLDL 50* 09/06/2014 1028   LDLCALC 91 09/06/2014 1028       Assessment and plan:   Kendra Vega was seen today for breast problem.  Diagnoses and all orders for this visit:  Abscess of breast Orders: -     fluconazole (DIFLUCAN) 150 MG tablet; Take 1 tablet (150 mg total) by mouth once. May repeat in 1 week if needed -     sulfamethoxazole-trimethoprim (BACTRIM DS,SEPTRA DS) 800-160 MG per tablet; Take 1 tablet by mouth 2 (two) times daily. -     traMADol (ULTRAM) 50 MG tablet; Take 1 tablet (50 mg total) by mouth every 12 (twelve) hours as needed. Will oral antibiotics to see if we can clear the abscess, given Diflucan in the event that patient developed sees infection.   Anxiety Orders: -     ALPRAZolam (XANAX) 0.5 MG tablet; Take 1 tablet (0.5 mg total) by mouth at bedtime as needed for anxiety (caution may cause drowsiness, don't mix with alcohol or narcotic pain medication). Patient's last refill was 5 months ago, will refill today.  Return for 2-3 weeks BP check-RN visit.       Holland Commons, NP-C [facility] [phone] 02/17/2015, 12:42 PM

## 2015-03-10 ENCOUNTER — Ambulatory Visit: Payer: Self-pay | Attending: Internal Medicine

## 2015-04-25 ENCOUNTER — Other Ambulatory Visit: Payer: Self-pay | Admitting: Internal Medicine

## 2015-05-18 ENCOUNTER — Other Ambulatory Visit: Payer: Self-pay | Admitting: Internal Medicine

## 2015-05-20 ENCOUNTER — Other Ambulatory Visit: Payer: Self-pay | Admitting: Pharmacist

## 2015-05-20 ENCOUNTER — Other Ambulatory Visit: Payer: Self-pay

## 2015-05-20 ENCOUNTER — Telehealth: Payer: Self-pay | Admitting: Internal Medicine

## 2015-05-20 DIAGNOSIS — I10 Essential (primary) hypertension: Secondary | ICD-10-CM

## 2015-05-20 MED ORDER — METOPROLOL TARTRATE 50 MG PO TABS: 50.0000 mg | ORAL_TABLET | Freq: Once | ORAL | Status: DC

## 2015-05-20 MED ORDER — METOPROLOL TARTRATE 50 MG PO TABS: 50.0000 mg | ORAL_TABLET | Freq: Two times a day (BID) | ORAL | Status: DC

## 2015-05-20 MED ORDER — LISINOPRIL-HYDROCHLOROTHIAZIDE 20-12.5 MG PO TABS: 1.0000 | ORAL_TABLET | Freq: Every day | ORAL | Status: DC

## 2015-05-20 NOTE — Telephone Encounter (Signed)
Pt. Is following up regarding medication refill (lisinopril)....please send to [facility] and call patient when ready

## 2015-05-20 NOTE — Telephone Encounter (Signed)
Patient has come in today to request a medication refill for Lisinopril; please f/u with patient as she states she has been out for two days

## 2015-06-09 ENCOUNTER — Ambulatory Visit: Payer: Self-pay | Attending: Internal Medicine | Admitting: Internal Medicine

## 2015-06-09 ENCOUNTER — Encounter: Payer: Self-pay | Admitting: Internal Medicine

## 2015-06-09 VITALS — BP 143/87 | HR 65 | Temp 98.5°F | Resp 18 | Ht 66.0 in | Wt 226.0 lb

## 2015-06-09 DIAGNOSIS — K5900 Constipation, unspecified: Secondary | ICD-10-CM | POA: Insufficient documentation

## 2015-06-09 DIAGNOSIS — I10 Essential (primary) hypertension: Secondary | ICD-10-CM | POA: Insufficient documentation

## 2015-06-09 DIAGNOSIS — Z720 Tobacco use: Secondary | ICD-10-CM | POA: Insufficient documentation

## 2015-06-09 LAB — COMPLETE METABOLIC PANEL WITH GFR
ALT: 13 U/L (ref 0–35)
AST: 16 U/L (ref 0–37)
Albumin: 3.8 g/dL (ref 3.5–5.2)
Alkaline Phosphatase: 79 U/L (ref 39–117)
BILIRUBIN TOTAL: 0.4 mg/dL (ref 0.2–1.2)
BUN: 12 mg/dL (ref 6–23)
CO2: 28 mEq/L (ref 19–32)
Calcium: 9.3 mg/dL (ref 8.4–10.5)
Chloride: 101 mEq/L (ref 96–112)
Creat: 1.19 mg/dL — ABNORMAL HIGH (ref 0.50–1.10)
GFR, EST AFRICAN AMERICAN: 64 mL/min
GFR, EST NON AFRICAN AMERICAN: 56 mL/min — AB
GLUCOSE: 88 mg/dL (ref 70–99)
Potassium: 4 mEq/L (ref 3.5–5.3)
SODIUM: 137 meq/L (ref 135–145)
Total Protein: 7.4 g/dL (ref 6.0–8.3)

## 2015-06-09 MED ORDER — METOPROLOL TARTRATE 50 MG PO TABS: 50.0000 mg | ORAL_TABLET | Freq: Two times a day (BID) | ORAL | Status: DC

## 2015-06-09 MED ORDER — LISINOPRIL-HYDROCHLOROTHIAZIDE 20-12.5 MG PO TABS: 1.0000 | ORAL_TABLET | Freq: Every day | ORAL | Status: DC

## 2015-06-09 MED ORDER — POLYETHYLENE GLYCOL 3350 17 GM/SCOOP PO POWD: 17.0000 g | Freq: Every day | ORAL | Status: DC

## 2015-06-09 NOTE — Progress Notes (Signed)
Patient here for 3 month follow up for BP.  Patient denies pain at this time. Patient had period on 05/17/15 and then again on 05/31/15, both lasting 7 days. Patient complains of constipation.

## 2015-06-09 NOTE — Patient Instructions (Signed)
I will call you with the results of your blood work. If levels are still off we may change your medications.

## 2015-06-09 NOTE — Progress Notes (Signed)
Patient ID: Kendra Vega, female   DOB: Jan 03, 1971, 44 y.o.   MRN: [id number] Subjective:  Kendra Vega is a 44 y.o. female with hypertension. She took her BP medication this morning. She reports that [facility] and was diagnosed with Bipolar disorder and panic disorder. She has since been placed on Latuda, vistaril, and Celexa. She notes that she has a strong family history of bipolar disorder. She has been on the medication less than 1 month. She continues to smoke 1 ppd and marijuana often. She is taking fish oil daily.   Current Outpatient Prescriptions  Medication Sig Dispense Refill  . aspirin (ASPIRIN EC) 81 MG EC tablet Take 1 tablet (81 mg total) by mouth daily. Swallow whole. 30 tablet 5  . citalopram (CELEXA) 20 MG tablet Take 20 mg by mouth daily.    . hydrOXYzine (ATARAX/VISTARIL) 25 MG tablet Take 25 mg by mouth 3 (three) times daily as needed.    Marland Kitchen. lisinopril-hydrochlorothiazide (PRINZIDE,ZESTORETIC) 20-12.5 MG per tablet Take 1 tablet by mouth daily. 30 tablet 0  . Lurasidone HCl 20 MG TABS Take 20 mg by mouth daily.    . metoprolol (LOPRESSOR) 50 MG tablet Take 1 tablet (50 mg total) by mouth 2 (two) times daily. 60 tablet 0  . traMADol (ULTRAM) 50 MG tablet Take 1 tablet (50 mg total) by mouth every 12 (twelve) hours as needed. 60 tablet 0  . ALPRAZolam (XANAX) 0.5 MG tablet Take 1 tablet (0.5 mg total) by mouth at bedtime as needed for anxiety (caution may cause drowsiness, don't mix with alcohol or narcotic pain medication). (Patient not taking: Reported on 06/09/2015) 30 tablet 0  . fluconazole (DIFLUCAN) 150 MG tablet Take 1 tablet (150 mg total) by mouth once. May repeat in 1 week if needed (Patient not taking: Reported on 06/09/2015) 1 tablet 1  . sulfamethoxazole-trimethoprim (BACTRIM DS,SEPTRA DS) 800-160 MG per tablet Take 1 tablet by mouth 2 (two) times daily. (Patient not taking: Reported on 06/09/2015) 20 tablet 0   No current facility-administered medications for  this visit.    Hypertension ROS: taking medications as instructed, no medication side effects noted, no TIA's, no chest pain on exertion, no dyspnea on exertion and no swelling of ankles.   Objective:  BP 143/87 mmHg  Pulse 65  Temp(Src) 98.5 F (36.9 C) (Oral)  Resp 18  Ht 5\' 6"  (1.676 m)  Wt 226 lb (102.513 kg)  BMI 36.49 kg/m2  SpO2 98%  LMP 05/31/2015  Appearance alert, well appearing, and in no distress and overweight. General exam BP noted to be borderline elevated today in office, S1, S2 normal, no gallop, no murmur, chest clear, no JVD, no HSM, no edema.  Lab review: labs are reviewed, up to date and normal.   Assessment:   Hypertension needs improvement, needs to quit smoking and needs to follow diet more regularly.  Constipation: Miralax daily  Plan:  Reviewed diet, exercise and weight control. Recommended sodium restriction. Very strongly urged to quit smoking to reduce cardiovascular risk. She needs to take medication daily without skipped doses. Explained that the daily recommended amount of fiber is 25 g, went over high fiber foods, encourage increased water intake, miralax use, and increased physical activity.  Patient given constipation handout.    Return in about 3 months (around 09/09/2015) for Hypertension.  Ambrose FinlandValerie A Tavi Hoogendoorn, NP 06/15/2015 11:16 PM

## 2015-06-10 ENCOUNTER — Other Ambulatory Visit: Payer: Self-pay | Admitting: Internal Medicine

## 2015-06-10 ENCOUNTER — Telehealth: Payer: Self-pay

## 2015-06-10 NOTE — Telephone Encounter (Signed)
-----   Message from Ambrose FinlandValerie A Keck, NP sent at 06/10/2015  9:09 AM EDT ----- Kidney function is improving, but still not quite normal yet

## 2015-06-10 NOTE — Telephone Encounter (Signed)
Nurse called patient, patient verified date of birth. Patient aware of improved kidney function, not quite normal yet. Patient voices understanding and has no further questions at this time.

## 2015-06-15 MED ORDER — METOPROLOL TARTRATE 50 MG PO TABS: 50.0000 mg | ORAL_TABLET | Freq: Two times a day (BID) | ORAL | Status: DC

## 2015-06-15 MED ORDER — LISINOPRIL-HYDROCHLOROTHIAZIDE 20-12.5 MG PO TABS: 1.0000 | ORAL_TABLET | Freq: Every day | ORAL | Status: DC

## 2015-07-07 ENCOUNTER — Other Ambulatory Visit: Payer: Self-pay | Admitting: Internal Medicine

## 2015-11-22 ENCOUNTER — Telehealth: Payer: Self-pay | Admitting: Internal Medicine

## 2015-11-22 DIAGNOSIS — I10 Essential (primary) hypertension: Secondary | ICD-10-CM

## 2015-11-22 MED ORDER — METOPROLOL TARTRATE 50 MG PO TABS: 50.0000 mg | ORAL_TABLET | Freq: Two times a day (BID) | ORAL | Status: DC

## 2015-11-22 NOTE — Telephone Encounter (Signed)
Pt. Called requesting a refill on metoprolol (LOPRESSOR) 50 MG tablet. Please f/u with pt.

## 2015-12-05 ENCOUNTER — Ambulatory Visit: Payer: Self-pay | Admitting: Internal Medicine

## 2015-12-12 ENCOUNTER — Ambulatory Visit: Payer: Self-pay | Attending: Internal Medicine | Admitting: Internal Medicine

## 2015-12-12 ENCOUNTER — Encounter: Payer: Self-pay | Admitting: Internal Medicine

## 2015-12-12 VITALS — BP 150/93 | HR 72 | Temp 98.0°F | Resp 16 | Ht 66.0 in | Wt 237.0 lb

## 2015-12-12 DIAGNOSIS — Z6838 Body mass index (BMI) 38.0-38.9, adult: Secondary | ICD-10-CM | POA: Insufficient documentation

## 2015-12-12 DIAGNOSIS — R252 Cramp and spasm: Secondary | ICD-10-CM | POA: Insufficient documentation

## 2015-12-12 DIAGNOSIS — F319 Bipolar disorder, unspecified: Secondary | ICD-10-CM | POA: Insufficient documentation

## 2015-12-12 DIAGNOSIS — Z7982 Long term (current) use of aspirin: Secondary | ICD-10-CM | POA: Insufficient documentation

## 2015-12-12 DIAGNOSIS — Z79899 Other long term (current) drug therapy: Secondary | ICD-10-CM | POA: Insufficient documentation

## 2015-12-12 DIAGNOSIS — I10 Essential (primary) hypertension: Secondary | ICD-10-CM | POA: Insufficient documentation

## 2015-12-12 MED ORDER — METOPROLOL TARTRATE 50 MG PO TABS: 50.0000 mg | ORAL_TABLET | Freq: Two times a day (BID) | ORAL | Status: DC

## 2015-12-12 MED ORDER — AMLODIPINE BESYLATE 5 MG PO TABS: 5.0000 mg | ORAL_TABLET | Freq: Every day | ORAL | Status: DC

## 2015-12-12 MED ORDER — LISINOPRIL-HYDROCHLOROTHIAZIDE 20-12.5 MG PO TABS: 1.0000 | ORAL_TABLET | Freq: Every day | ORAL | Status: DC

## 2015-12-12 MED FILL — ?METOPROLOL 50 MG TABLET: 50 | 30 days supply | Qty: 60 | Fill #0

## 2015-12-12 MED FILL — ?AMLODIPINE BESYLATE 5 MG T: 5 | 30 days supply | Qty: 30 | Fill #0

## 2015-12-12 MED FILL — LISINOPRIL-HCTZ 20-12.5 MG: 20-12.5 | 30 days supply | Qty: 30 | Fill #0

## 2015-12-12 NOTE — Progress Notes (Signed)
Patient ID: Nils FlackLiz [place]hill, female   DOB: 1971/07/28, 45 y.o.   MRN: [id number] Subjective:  Nils FlackLiz Jaquez is a 45 y.o. female with hypertension. Patient has a history of bipolar depression and panic disorder. [facility]. Sometimes she feels dizzy and lightheaded. She states that when she goes to her psychiatrist her blood pressure is always elevated.  Patient is concerned about leg cramps that often cause her to wake up at night. Cramps are in her feet and thighs. Current Outpatient Prescriptions  Medication Sig Dispense Refill  . aspirin (ASPIRIN EC) 81 MG EC tablet Take 1 tablet (81 mg total) by mouth daily. Swallow whole. 30 tablet 5  . citalopram (CELEXA) 20 MG tablet Take 20 mg by mouth daily.    . hydrOXYzine (ATARAX/VISTARIL) 25 MG tablet Take 25 mg by mouth 3 (three) times daily as needed.    Marland Kitchen. lisinopril-hydrochlorothiazide (PRINZIDE,ZESTORETIC) 20-12.5 MG per tablet Take 1 tablet by mouth daily. 30 tablet 4  . Lurasidone HCl 20 MG TABS Take 20 mg by mouth daily.    . metoprolol (LOPRESSOR) 50 MG tablet Take 1 tablet (50 mg total) by mouth 2 (two) times daily. 60 tablet 0  . fluconazole (DIFLUCAN) 150 MG tablet Take 1 tablet (150 mg total) by mouth once. May repeat in 1 week if needed (Patient not taking: Reported on 06/09/2015) 1 tablet 1  . polyethylene glycol powder (GLYCOLAX/MIRALAX) powder Take 17 g by mouth daily. 850 g 2  . traMADol (ULTRAM) 50 MG tablet Take 1 tablet (50 mg total) by mouth every 12 (twelve) hours as needed. 60 tablet 0   No current facility-administered medications for this visit.   ROS: taking medications as instructed, no medication side effects noted, no TIA's, no chest pain on exertion, no dyspnea on exertion, no swelling of ankles and no palpitations. All other systems negative   Objective:  BP 150/93 mmHg  Pulse 72  Temp(Src) 98 F (36.7 C)  Resp 16  Ht 5\' 6"  (1.676 m)  Wt 237 lb (107.502 kg)  BMI 38.27 kg/m2  SpO2 100%   Appearance alert, well appearing, and in no distress, oriented to person, place, and time and overweight. General exam BP noted to be mildly elevated today in office, S1, S2 normal, no gallop, no murmur, chest clear, no JVD, no HSM, no edema.  Lab review: orders written for new lab studies as appropriate; see orders.   Assessment:   Marisue IvanLiz was seen today for follow-up.  Diagnoses and all orders for this visit:  Essential hypertension -     Begin amLODipine (NORVASC) 5 MG tablet; Take 1 tablet (5 mg total) by mouth daily. -     lisinopril-hydrochlorothiazide (PRINZIDE,ZESTORETIC) 20-12.5 MG tablet; Take 1 tablet by mouth daily. -     metoprolol (LOPRESSOR) 50 MG tablet; Take 1 tablet (50 mg total) by mouth 2 (two) times daily. BP is not well controlled on two agents will add Amlodipine today. Will come back in 2 weeks for a BP check. DASH diet, weight loss, exercise advised.   Cramp of both lower extremities -     Basic Metabolic Panel -     Magnesium Will check electrolytes for cramps.    Return in about 2 weeks (around 12/26/2015) for Nurse Visit-BP check and 3 mo PCP HTN.   Ambrose FinlandValerie A Keck, NP 12/12/2015 6:26 PM

## 2015-12-12 NOTE — Progress Notes (Signed)
Patient here for follow up on her HTN and Kendra RifeGerd

## 2015-12-13 ENCOUNTER — Telehealth: Payer: Self-pay

## 2015-12-13 LAB — BASIC METABOLIC PANEL
BUN: 12 mg/dL (ref 7–25)
CO2: 29 mmol/L (ref 20–31)
Calcium: 10 mg/dL (ref 8.6–10.2)
Chloride: 100 mmol/L (ref 98–110)
Creat: 1.25 mg/dL — ABNORMAL HIGH (ref 0.50–1.10)
GLUCOSE: 106 mg/dL — AB (ref 65–99)
POTASSIUM: 4.1 mmol/L (ref 3.5–5.3)
SODIUM: 136 mmol/L (ref 135–146)

## 2015-12-13 LAB — MAGNESIUM: MAGNESIUM: 1.8 mg/dL (ref 1.5–2.5)

## 2015-12-13 NOTE — Telephone Encounter (Signed)
-----   Message from Kendra Finland, NP sent at 12/13/2015 11:55 AM EST ----- Potassium and magnesium were bother normal. Her kidney function is creeping back up slightly. The sooner we get her BP controlled the better it will be for her kidneys

## 2015-12-13 NOTE — Telephone Encounter (Signed)
Spoke with patient this am and she is aware of her lab results Patient also verbalized her understanding on how important it is  To get her B/P under control

## 2015-12-27 ENCOUNTER — Ambulatory Visit: Payer: Self-pay | Admitting: Pharmacist

## 2016-01-16 MED FILL — ?AMLODIPINE BESYLATE 5 MG T: 5 | 30 days supply | Qty: 30 | Fill #1

## 2016-01-16 MED FILL — LISINOPRIL-HCTZ 20-12.5 MG: 20-12.5 | 30 days supply | Qty: 30 | Fill #1

## 2016-01-16 MED FILL — METOPROLOL TARTRATE 50 MG T: 50 | 30 days supply | Qty: 60 | Fill #1

## 2016-02-15 MED FILL — LISINOPRIL-HCTZ 20-12.5 MG: 20-12.5 | 30 days supply | Qty: 30 | Fill #2

## 2016-02-15 MED FILL — METOPROLOL TARTRATE 50 MG T: 50 | 30 days supply | Qty: 60 | Fill #2

## 2016-02-15 MED FILL — ?AMLODIPINE BESYLATE 5 MG T: 5 | 30 days supply | Qty: 30 | Fill #2

## 2016-03-19 MED FILL — ?AMLODIPINE BESYLATE 5 MG T: 5 | 30 days supply | Qty: 30 | Fill #3

## 2016-03-19 MED FILL — ?METOPROLOL 50 MG TABLET: 50 | 30 days supply | Qty: 60 | Fill #3

## 2016-03-19 MED FILL — LISINOPRIL-HCTZ 20-12.5 MG: 20-12.5 | 30 days supply | Qty: 30 | Fill #3

## 2016-04-24 ENCOUNTER — Other Ambulatory Visit: Payer: Self-pay | Admitting: Internal Medicine

## 2016-04-24 MED FILL — LISINOPRIL-HCTZ 20-12.5 MG: 20-12.5 | 30 days supply | Qty: 30 | Fill #4

## 2016-04-24 MED FILL — AMLODIPINE BESYLATE 5 MG TA: 5 | 30 days supply | Qty: 30 | Fill #0

## 2016-04-24 MED FILL — ?METOPROLOL 50 MG TABLET: 50 | 30 days supply | Qty: 60 | Fill #4

## 2016-05-24 ENCOUNTER — Telehealth: Payer: Self-pay | Admitting: Internal Medicine

## 2016-05-24 DIAGNOSIS — I10 Essential (primary) hypertension: Secondary | ICD-10-CM

## 2016-05-24 NOTE — Telephone Encounter (Signed)
Pt. Called requesting a refill on the following medications:  amLODipine (NORVASC) 5 MG tablet lisinopril-hydrochlorothiazide (PRINZIDE,ZESTORETIC) 20-12.5 MG tablet metoprolol (LOPRESSOR) 50 MG tablet  Please f/u with pt.

## 2016-05-25 MED ORDER — AMLODIPINE BESYLATE 5 MG PO TABS: 5.0000 mg | ORAL_TABLET | Freq: Every day | ORAL | Status: DC

## 2016-05-25 MED ORDER — METOPROLOL TARTRATE 50 MG PO TABS: 50.0000 mg | ORAL_TABLET | Freq: Two times a day (BID) | ORAL | Status: DC

## 2016-05-25 MED ORDER — LISINOPRIL-HYDROCHLOROTHIAZIDE 20-12.5 MG PO TABS: 1.0000 | ORAL_TABLET | Freq: Every day | ORAL | Status: DC

## 2016-05-25 NOTE — Telephone Encounter (Signed)
Refilled medications but patient must have office visit for further refills.

## 2016-05-31 MED FILL — LISINOPRIL-HCTZ 20-12.5 MG: 20-12.5 | 30 days supply | Qty: 30 | Fill #0

## 2016-05-31 MED FILL — ?METOPROLOL 50 MG TABLET: 50 | 30 days supply | Qty: 60 | Fill #0

## 2016-05-31 MED FILL — AMLODIPINE BESYLATE 5 MG TA: 5 | 30 days supply | Qty: 30 | Fill #0

## 2016-06-22 ENCOUNTER — Ambulatory Visit: Payer: Self-pay | Attending: Family Medicine | Admitting: Family Medicine

## 2016-06-22 ENCOUNTER — Encounter: Payer: Self-pay | Admitting: Family Medicine

## 2016-06-22 VITALS — BP 153/92 | HR 62 | Temp 98.2°F | Ht 66.0 in | Wt 229.2 lb

## 2016-06-22 DIAGNOSIS — Z7982 Long term (current) use of aspirin: Secondary | ICD-10-CM | POA: Insufficient documentation

## 2016-06-22 DIAGNOSIS — N63 Unspecified lump in breast: Secondary | ICD-10-CM | POA: Insufficient documentation

## 2016-06-22 DIAGNOSIS — N6001 Solitary cyst of right breast: Secondary | ICD-10-CM | POA: Insufficient documentation

## 2016-06-22 DIAGNOSIS — Z6836 Body mass index (BMI) 36.0-36.9, adult: Secondary | ICD-10-CM | POA: Insufficient documentation

## 2016-06-22 DIAGNOSIS — E669 Obesity, unspecified: Secondary | ICD-10-CM | POA: Insufficient documentation

## 2016-06-22 DIAGNOSIS — F3130 Bipolar disorder, current episode depressed, mild or moderate severity, unspecified: Secondary | ICD-10-CM

## 2016-06-22 DIAGNOSIS — F319 Bipolar disorder, unspecified: Secondary | ICD-10-CM | POA: Insufficient documentation

## 2016-06-22 DIAGNOSIS — Z79899 Other long term (current) drug therapy: Secondary | ICD-10-CM | POA: Insufficient documentation

## 2016-06-22 DIAGNOSIS — I10 Essential (primary) hypertension: Secondary | ICD-10-CM | POA: Insufficient documentation

## 2016-06-22 MED ORDER — AMLODIPINE BESYLATE 5 MG PO TABS: 5.0000 mg | ORAL_TABLET | Freq: Every day | ORAL | 3 refills | Status: DC

## 2016-06-22 MED ORDER — METOPROLOL TARTRATE 50 MG PO TABS
50.0000 mg | ORAL_TABLET | Freq: Two times a day (BID) | ORAL | 3 refills | Status: DC
Start: 2016-06-22 — End: 2016-11-14

## 2016-06-22 MED ORDER — LISINOPRIL-HYDROCHLOROTHIAZIDE 20-12.5 MG PO TABS: 1.0000 | ORAL_TABLET | Freq: Every day | ORAL | 3 refills | Status: DC

## 2016-06-22 NOTE — Progress Notes (Signed)
Subjective:  Patient ID: Kendra Vega, female    DOB: 08-Mar-1971  Age: 45 y.o. MRN: [id number]  CC: Hypertension and Manic Behavior   HPI Kendra Vega is a 45 year old female with history of hypertension, bipolar depression who comes in to establish care with me and she was previously followed by the nurse practitioner who is no longer with the clinic. Her blood pressure is elevated today and she admits to just taking her antihypertensives a few minutes ago.  Her bipolar disorder is managed at [facility] and she has been compliant with medications. Complains of the right breast lesion which she describes as "a boil which has been last on 2 different occasions". Denies pain or discharge. Last mammogram was in 01/2011  Past Medical History:  Diagnosis Date  . Hypertension     History reviewed. No pertinent surgical history.    Outpatient Medications Prior to Visit  Medication Sig Dispense Refill  . aspirin (ASPIRIN EC) 81 MG EC tablet Take 1 tablet (81 mg total) by mouth daily. Swallow whole. 30 tablet 5  . citalopram (CELEXA) 20 MG tablet Take 20 mg by mouth daily.    . hydrOXYzine (ATARAX/VISTARIL) 25 MG tablet Take 25 mg by mouth 3 (three) times daily as needed.    . Lurasidone HCl 20 MG TABS Take 20 mg by mouth daily.    Marland Kitchen amLODipine (NORVASC) 5 MG tablet Take 1 tablet (5 mg total) by mouth daily. Must have office visit for refills 30 tablet 0  . lisinopril-hydrochlorothiazide (PRINZIDE,ZESTORETIC) 20-12.5 MG tablet Take 1 tablet by mouth daily. Must have office visit for refills 30 tablet 0  . metoprolol (LOPRESSOR) 50 MG tablet Take 1 tablet (50 mg total) by mouth 2 (two) times daily. Must have office visit for refills 60 tablet 0  . fluconazole (DIFLUCAN) 150 MG tablet Take 1 tablet (150 mg total) by mouth once. May repeat in 1 week if needed (Patient not taking: Reported on 06/09/2015) 1 tablet 1  . polyethylene glycol powder (GLYCOLAX/MIRALAX) powder Take 17 g by mouth daily.  (Patient not taking: Reported on 06/22/2016) 850 g 2  . traMADol (ULTRAM) 50 MG tablet Take 1 tablet (50 mg total) by mouth every 12 (twelve) hours as needed. (Patient not taking: Reported on 06/22/2016) 60 tablet 0   No facility-administered medications prior to visit.     ROS Review of Systems  Constitutional: Negative for activity change and appetite change.  HENT: Negative for sinus pressure and sore throat.   Respiratory: Negative for chest tightness, shortness of breath and wheezing.   Cardiovascular: Negative for chest pain and palpitations.  Gastrointestinal: Negative for abdominal distention, abdominal pain and constipation.  Genitourinary: Negative.   Musculoskeletal: Negative.   Psychiatric/Behavioral: Negative for behavioral problems and dysphoric mood.    Objective:  BP (!) 153/92 (BP Location: Right Arm, Patient Position: Sitting, Cuff Size: Large)   Pulse 62   Temp 98.2 F (36.8 C) (Oral)   Ht  (1.676 m)   Wt 229 lb 3.2 oz (104 kg)   SpO2 94%   BMI 36.99 kg/m   BP/Weight 06/22/2016 12/12/2015 06/09/2015  Systolic BP 153 150 143  Diastolic BP 92 93 87  Wt. (Lbs) 229.2 237 226  BMI 36.99 38.27 36.49      Physical Exam  Constitutional: She is oriented to person, place, and time. She appears well-developed and well-nourished.  Cardiovascular: Normal rate, normal heart sounds and intact distal pulses.   No murmur heard. Pulmonary/Chest: Effort normal and  breath sounds normal. She has no wheezes. She has no rales. She exhibits no tenderness. Right breast exhibits mass (right nipple fluctuant mass at 10 o'clock which is not tender) and skin change. Right breast exhibits no nipple discharge. Left breast exhibits skin change. Left breast exhibits no mass and no nipple discharge.  Abdominal: Soft. Bowel sounds are normal. She exhibits no distension and no mass. There is no tenderness.  Musculoskeletal: Normal range of motion.  Neurological: She is alert and oriented  to person, place, and time.     Assessment & Plan:   1. Essential hypertension Uncontrolled Will make no changes to regimen as she just took her pills - Lipid panel; Future - COMPLETE METABOLIC PANEL WITH GFR; Future - metoprolol (LOPRESSOR) 50 MG tablet; Take 1 tablet (50 mg total) by mouth 2 (two) times daily.  Dispense: 60 tablet; Refill: 3 - lisinopril-hydrochlorothiazide (PRINZIDE,ZESTORETIC) 20-12.5 MG tablet; Take 1 tablet by mouth daily.  Dispense: 30 tablet; Refill: 3 - amLODipine (NORVASC) 5 MG tablet; Take 1 tablet (5 mg total) by mouth daily.  Dispense: 30 tablet; Refill: 3  2. Bipolar depression (HCC) Continue Lurasidone, Celexa, Hydroxyzine  3. Simple cyst of right breast - MM Digital Diagnostic Bilat; Future - US BREAST COMPLETE UNI RIGHT INC AXILLA; Future  4. OBESITY Placed on reducing portion sizes, exercises.   Meds ordered this encounter  Medications  . metoprolol (LOPRESSOR) 50 MG tablet    Sig: Take 1 tablet (50 mg total) by mouth 2 (two) times daily.    Dispense:  60 tablet    Refill:  3  . lisinopril-hydrochlorothiazide (PRINZIDE,ZESTORETIC) 20-12.5 MG tablet    Sig: Take 1 tablet by mouth daily.    Dispense:  30 tablet    Refill:  3  . amLODipine (NORVASC) 5 MG tablet    Sig: Take 1 tablet (5 mg total) by mouth daily.    Dispense:  30 tablet    Refill:  3    Follow-up: Return in about 1 month (around 07/23/2016) for Follow-up on right breast cyst.   Jaclyn Shaggy MD

## 2016-06-22 NOTE — Progress Notes (Signed)
Check lesion on right breast Medication refills

## 2016-06-27 ENCOUNTER — Ambulatory Visit: Payer: Self-pay | Attending: Internal Medicine

## 2016-07-02 MED FILL — METOPROLOL TARTRATE 50 MG T: 50 | 30 days supply | Qty: 60 | Fill #0

## 2016-07-02 MED FILL — LISINOPRIL-HCTZ 20-12.5 MG: 20-12.5 | 30 days supply | Qty: 30 | Fill #0

## 2016-07-02 MED FILL — AMLODIPINE BESYLATE 5 MG TA: 5 | 30 days supply | Qty: 30 | Fill #0

## 2016-07-03 ENCOUNTER — Other Ambulatory Visit: Payer: Self-pay | Admitting: Family Medicine

## 2016-07-03 DIAGNOSIS — N6001 Solitary cyst of right breast: Secondary | ICD-10-CM

## 2016-07-31 MED FILL — LISINOPRIL-HCTZ 20-12.5 MG: 20-12.5 | 30 days supply | Qty: 30 | Fill #1

## 2016-07-31 MED FILL — ?AMLODIPINE BESYLATE 5 MG T: 5 | 30 days supply | Qty: 30 | Fill #1

## 2016-07-31 MED FILL — METOPROLOL TARTRATE 50 MG T: 50 | 30 days supply | Qty: 60 | Fill #1

## 2016-09-17 MED FILL — ?AMLODIPINE BESYLATE 5 MG T: 5 | 30 days supply | Qty: 30 | Fill #2

## 2016-09-17 MED FILL — LISINOPRIL-HCTZ 20-12.5 MG: 20-12.5 | 30 days supply | Qty: 30 | Fill #2

## 2016-09-17 MED FILL — ?METOPROLOL 50 MG TABLET: 50 | 30 days supply | Qty: 60 | Fill #2

## 2016-10-24 MED FILL — ?AMLODIPINE BESYLATE 5 MG T: 5 | 30 days supply | Qty: 30 | Fill #3

## 2016-10-24 MED FILL — METOPROLOL TARTRATE 50 MG T: 50 | 30 days supply | Qty: 60 | Fill #3

## 2016-10-24 MED FILL — LISINOPRIL-HCTZ 20-12.5 MG: 20-12.5 | 30 days supply | Qty: 30 | Fill #3

## 2016-10-27 ENCOUNTER — Emergency Department (HOSPITAL_COMMUNITY): Payer: Self-pay

## 2016-10-27 ENCOUNTER — Ambulatory Visit (HOSPITAL_COMMUNITY)
Admission: EM | Admit: 2016-10-27 | Discharge: 2016-10-27 | Disposition: A | Discharge status: Nursing Home (Includes ICF) | Payer: Self-pay | Attending: Emergency Medicine | Admitting: Emergency Medicine

## 2016-10-27 ENCOUNTER — Encounter (HOSPITAL_COMMUNITY): Payer: Self-pay | Admitting: *Deleted

## 2016-10-27 ENCOUNTER — Inpatient Hospital Stay (HOSPITAL_COMMUNITY)
Admission: EM | Admit: 2016-10-27 | Discharge: 2016-10-29 | DRG: 065 | Disposition: A | Discharge status: Home / Self Care | Payer: Self-pay | Attending: Internal Medicine | Admitting: Internal Medicine

## 2016-10-27 DIAGNOSIS — N289 Disorder of kidney and ureter, unspecified: Secondary | ICD-10-CM | POA: Diagnosis present

## 2016-10-27 DIAGNOSIS — K219 Gastro-esophageal reflux disease without esophagitis: Secondary | ICD-10-CM | POA: Diagnosis present

## 2016-10-27 DIAGNOSIS — I129 Hypertensive chronic kidney disease with stage 1 through stage 4 chronic kidney disease, or unspecified chronic kidney disease: Secondary | ICD-10-CM | POA: Diagnosis present

## 2016-10-27 DIAGNOSIS — F418 Other specified anxiety disorders: Secondary | ICD-10-CM | POA: Diagnosis present

## 2016-10-27 DIAGNOSIS — E871 Hypo-osmolality and hyponatremia: Secondary | ICD-10-CM | POA: Diagnosis present

## 2016-10-27 DIAGNOSIS — N183 Chronic kidney disease, stage 3 unspecified: Secondary | ICD-10-CM | POA: Diagnosis present

## 2016-10-27 DIAGNOSIS — Z8673 Personal history of transient ischemic attack (TIA), and cerebral infarction without residual deficits: Secondary | ICD-10-CM | POA: Diagnosis present

## 2016-10-27 DIAGNOSIS — Z79899 Other long term (current) drug therapy: Secondary | ICD-10-CM

## 2016-10-27 DIAGNOSIS — R7989 Other specified abnormal findings of blood chemistry: Secondary | ICD-10-CM | POA: Diagnosis present

## 2016-10-27 DIAGNOSIS — R9431 Abnormal electrocardiogram [ECG] [EKG]: Secondary | ICD-10-CM | POA: Diagnosis present

## 2016-10-27 DIAGNOSIS — R200 Anesthesia of skin: Secondary | ICD-10-CM

## 2016-10-27 DIAGNOSIS — I69398 Other sequelae of cerebral infarction: Secondary | ICD-10-CM

## 2016-10-27 DIAGNOSIS — H34231 Retinal artery branch occlusion, right eye: Secondary | ICD-10-CM

## 2016-10-27 DIAGNOSIS — Z833 Family history of diabetes mellitus: Secondary | ICD-10-CM

## 2016-10-27 DIAGNOSIS — Z8249 Family history of ischemic heart disease and other diseases of the circulatory system: Secondary | ICD-10-CM

## 2016-10-27 DIAGNOSIS — D72829 Elevated white blood cell count, unspecified: Secondary | ICD-10-CM

## 2016-10-27 DIAGNOSIS — J029 Acute pharyngitis, unspecified: Secondary | ICD-10-CM | POA: Diagnosis present

## 2016-10-27 DIAGNOSIS — Z7982 Long term (current) use of aspirin: Secondary | ICD-10-CM

## 2016-10-27 DIAGNOSIS — I639 Cerebral infarction, unspecified: Principal | ICD-10-CM | POA: Diagnosis present

## 2016-10-27 DIAGNOSIS — E1122 Type 2 diabetes mellitus with diabetic chronic kidney disease: Secondary | ICD-10-CM | POA: Diagnosis present

## 2016-10-27 DIAGNOSIS — R748 Abnormal levels of other serum enzymes: Secondary | ICD-10-CM | POA: Diagnosis present

## 2016-10-27 DIAGNOSIS — R519 Headache, unspecified: Secondary | ICD-10-CM

## 2016-10-27 DIAGNOSIS — G459 Transient cerebral ischemic attack, unspecified: Secondary | ICD-10-CM

## 2016-10-27 DIAGNOSIS — F17210 Nicotine dependence, cigarettes, uncomplicated: Secondary | ICD-10-CM | POA: Diagnosis present

## 2016-10-27 DIAGNOSIS — J069 Acute upper respiratory infection, unspecified: Secondary | ICD-10-CM | POA: Diagnosis present

## 2016-10-27 DIAGNOSIS — Z6832 Body mass index (BMI) 32.0-32.9, adult: Secondary | ICD-10-CM

## 2016-10-27 DIAGNOSIS — E876 Hypokalemia: Secondary | ICD-10-CM | POA: Diagnosis present

## 2016-10-27 DIAGNOSIS — I6789 Other cerebrovascular disease: Secondary | ICD-10-CM | POA: Diagnosis present

## 2016-10-27 DIAGNOSIS — I1A0 Resistant hypertension: Secondary | ICD-10-CM | POA: Diagnosis present

## 2016-10-27 DIAGNOSIS — H5461 Unqualified visual loss, right eye, normal vision left eye: Secondary | ICD-10-CM | POA: Diagnosis present

## 2016-10-27 DIAGNOSIS — N1832 Chronic kidney disease, stage 3b: Secondary | ICD-10-CM | POA: Diagnosis present

## 2016-10-27 DIAGNOSIS — R51 Headache: Secondary | ICD-10-CM

## 2016-10-27 DIAGNOSIS — R778 Other specified abnormalities of plasma proteins: Secondary | ICD-10-CM | POA: Diagnosis present

## 2016-10-27 DIAGNOSIS — E785 Hyperlipidemia, unspecified: Secondary | ICD-10-CM | POA: Diagnosis present

## 2016-10-27 DIAGNOSIS — R269 Unspecified abnormalities of gait and mobility: Secondary | ICD-10-CM

## 2016-10-27 DIAGNOSIS — I10 Essential (primary) hypertension: Secondary | ICD-10-CM | POA: Diagnosis present

## 2016-10-27 DIAGNOSIS — E669 Obesity, unspecified: Secondary | ICD-10-CM | POA: Diagnosis present

## 2016-10-27 DIAGNOSIS — F319 Bipolar disorder, unspecified: Secondary | ICD-10-CM | POA: Diagnosis present

## 2016-10-27 HISTORY — DX: Cerebral infarction, unspecified: I63.9

## 2016-10-27 HISTORY — DX: Blindness, one eye, unspecified eye: H54.40

## 2016-10-27 LAB — BASIC METABOLIC PANEL
ANION GAP: 10 (ref 5–15)
BUN: 10 mg/dL (ref 6–20)
CALCIUM: 8.4 mg/dL — AB (ref 8.9–10.3)
CO2: 23 mmol/L (ref 22–32)
Chloride: 100 mmol/L — ABNORMAL LOW (ref 101–111)
Creatinine, Ser: 1.4 mg/dL — ABNORMAL HIGH (ref 0.44–1.00)
GFR, EST AFRICAN AMERICAN: 51 mL/min — AB (ref >60)
GFR, EST NON AFRICAN AMERICAN: 44 mL/min — AB (ref >60)
GLUCOSE: 137 mg/dL — AB (ref 65–99)
Potassium: 2.9 mmol/L — ABNORMAL LOW (ref 3.5–5.1)
Sodium: 133 mmol/L — ABNORMAL LOW (ref 135–145)

## 2016-10-27 LAB — URINALYSIS, ROUTINE W REFLEX MICROSCOPIC
BILIRUBIN URINE: NEGATIVE
GLUCOSE, UA: NEGATIVE mg/dL
Hgb urine dipstick: NEGATIVE
KETONES UR: NEGATIVE mg/dL
LEUKOCYTES UA: NEGATIVE
Nitrite: NEGATIVE
PROTEIN: NEGATIVE mg/dL
Specific Gravity, Urine: 1.022 (ref 1.005–1.030)
pH: 6 (ref 5.0–8.0)

## 2016-10-27 LAB — CBC WITH DIFFERENTIAL/PLATELET
Basophils Absolute: 0 10*3/uL (ref 0.0–0.1)
Basophils Relative: 0 %
EOS ABS: 0.3 10*3/uL (ref 0.0–0.7)
Eosinophils Relative: 2 %
HEMATOCRIT: 36.2 % (ref 36.0–46.0)
HEMOGLOBIN: 11.7 g/dL — AB (ref 12.0–15.0)
LYMPHS ABS: 4.1 10*3/uL — AB (ref 0.7–4.0)
LYMPHS PCT: 34 %
MCH: 25.9 pg — ABNORMAL LOW (ref 26.0–34.0)
MCHC: 32.3 g/dL (ref 30.0–36.0)
MCV: 80.1 fL (ref 78.0–100.0)
MONOS PCT: 4 %
Monocytes Absolute: 0.5 10*3/uL (ref 0.1–1.0)
NEUTROS ABS: 7.2 10*3/uL (ref 1.7–7.7)
NEUTROS PCT: 60 %
PLATELETS: 407 10*3/uL — AB (ref 150–400)
RBC: 4.52 MIL/uL (ref 3.87–5.11)
RDW: 16.4 % — AB (ref 11.5–15.5)
WBC: 12 10*3/uL — AB (ref 4.0–10.5)

## 2016-10-27 LAB — RAPID URINE DRUG SCREEN, HOSP PERFORMED
Amphetamines: NOT DETECTED
BARBITURATES: NOT DETECTED
Benzodiazepines: NOT DETECTED
COCAINE: NOT DETECTED
Opiates: NOT DETECTED
Tetrahydrocannabinol: POSITIVE — AB

## 2016-10-27 LAB — I-STAT TROPONIN, ED: TROPONIN I, POC: 0.11 ng/mL — AB (ref 0.00–0.08)

## 2016-10-27 LAB — ETHANOL: Alcohol, Ethyl (B): <5 mg/dL (ref <5)

## 2016-10-27 MED ORDER — POTASSIUM CHLORIDE CRYS ER 20 MEQ PO TBCR
40.0000 meq | EXTENDED_RELEASE_TABLET | Freq: Once | ORAL | Status: AC
  Administered 2016-10-27: 40 meq via ORAL
  Filled 2016-10-27: qty 2

## 2016-10-27 MED ORDER — SODIUM CHLORIDE 0.9 % IV BOLUS (SEPSIS)
1000.0000 mL | Freq: Once | INTRAVENOUS | Status: AC
  Administered 2016-10-27: 1000 mL via INTRAVENOUS

## 2016-10-27 MED ORDER — LORAZEPAM 2 MG/ML IJ SOLN
1.0000 mg | Freq: Once | INTRAMUSCULAR | Status: AC
  Administered 2016-10-27: 1 mg via INTRAVENOUS
  Filled 2016-10-27: qty 1

## 2016-10-27 NOTE — ED Notes (Signed)
Pt enroute to CT,  She will give urine upon return.

## 2016-10-27 NOTE — ED Notes (Signed)
Per UC to this nurse, patient sent here for stroke like symptoms. Pt LSN was yetsterday. C/o R sided numbness and unsteady gait. Triage nurse notified patient is outside of window.

## 2016-10-27 NOTE — ED Notes (Signed)
Kendra Vega, ED First Nurse notified of pt report.

## 2016-10-27 NOTE — ED Notes (Signed)
Patient transported to MRI 

## 2016-10-27 NOTE — ED Triage Notes (Signed)
C/O HA, right earache, congestion yesterday.  Woke this AM @ 0800 and noticed right scalp and facial numbness.  Denies parasthesias in extremities.  Also c/o drifting gate to right side, feeling off balance.  Bilat hand grasps and foot push/pull equal and strong.  Tongue midline.  Pt has CVA hx with right eye blindness.  Took cetirizine yesterday.  Also took Tyl - HA resolved today, but numbness continues.

## 2016-10-27 NOTE — ED Notes (Signed)
Spoke with Dr Jacqulyn BathLong regarding CT  Stated we did not need a CT now and if needed it could be ordered at a later time

## 2016-10-27 NOTE — ED Notes (Signed)
ED Provider at bedside. 

## 2016-10-27 NOTE — ED Notes (Signed)
Pt returned from CT °

## 2016-10-27 NOTE — ED Provider Notes (Signed)
[facility] Provider Note   CSN: [id number] Arrival date & time: 10/27/16  1953     History   Chief Complaint Chief Complaint  Patient presents with  . Numbness  . Otalgia    HPI Kendra Vega is a 45 y.o. female.  HPI   45 year old female with history of prior stroke status post right eye blindness, hypertension, presenting today with right face numbness. Patient states yesterday afternoon she developed pain in the right year, tingling and numbness to the right side of the face, throbbing sinus headache, and mild throat discomfort. Herfacial numbness has improved as well as her headache but the symptoms has not fully alleviated. She denies any specific treatment triedAside from Tylenol. She did take her regular medication including baby aspirin daily. She denies having fever, ringing in ears, sneezing, coughing, neck stiffness, chest pain, shortness of breath, abdominal pain, back pain, nausea vomiting diarrhea, weakness in arms or legs. She was initially seen at the urgent care today but was sent here for further evaluation. Her last stroke was in 2013.  Past Medical History:  Diagnosis Date  . 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 Hypertension   . Stroke [facility](HCC)     Patient Active Problem List   Diagnosis Date Noted  . Simple cyst of right breast 06/22/2016  . WARTS, HAND 01/19/2011  . CANDIDIASIS 01/19/2011  . OBESITY 01/19/2011  . GASTROESOPHAGEAL REFLUX DISEASE 08/10/2010  . Bipolar depression (HCC) 08/10/2010  . ANXIETY 06/27/2010  . TOBACCO ABUSE 06/27/2010  . VISUAL ACUITY, DECREASED, RIGHT EYE 06/07/2010  . HYPERTENSION, BENIGN ESSENTIAL 06/07/2010    History reviewed. No pertinent surgical history.  OB History    No data available       Home Medications    Prior to Admission medications   Medication Sig Start Date End Date Taking? Authorizing Provider  amLODipine (NORVASC) 5 MG tablet Take 1 tablet (5 mg total) by mouth daily. 06/22/16    Jaclyn ShaggyEnobong Amao, MD  aspirin (ASPIRIN EC) 81 MG EC tablet Take 1 tablet (81 mg total) by mouth daily. Swallow whole. 07/30/13   Ripudeep Jenna LuoK Rai, MD  citalopram (CELEXA) 20 MG tablet Take 20 mg by mouth daily.    Historical Provider, MD  fluconazole (DIFLUCAN) 150 MG tablet Take 1 tablet (150 mg total) by mouth once. May repeat in 1 week if needed Patient not taking: Reported on 06/09/2015 02/17/15   Ambrose FinlandValerie A Keck, NP  hydrOXYzine (ATARAX/VISTARIL) 25 MG tablet Take 25 mg by mouth 3 (three) times daily as needed.    Historical Provider, MD  lisinopril-hydrochlorothiazide (PRINZIDE,ZESTORETIC) 20-12.5 MG tablet Take 1 tablet by mouth daily. 06/22/16   Jaclyn ShaggyEnobong Amao, MD  Lurasidone HCl 20 MG TABS Take 20 mg by mouth daily.    Historical Provider, MD  metoprolol (LOPRESSOR) 50 MG tablet Take 1 tablet (50 mg total) by mouth 2 (two) times daily. 06/22/16   Jaclyn ShaggyEnobong Amao, MD  polyethylene glycol powder (GLYCOLAX/MIRALAX) powder Take 17 g by mouth daily. Patient not taking: Reported on 06/22/2016 06/09/15   Ambrose FinlandValerie A Keck, NP    Family History Family History  Problem Relation Age of Onset  . Diabetes Mother   . Hypertension Mother   . Hypertension Sister     Social History Social History  Substance Use Topics  . Smoking status: Current Every Day Smoker    Packs/day: 0.50  . Smokeless tobacco: Never Used  . Alcohol use No     Allergies  Patient has no known allergies.   Review of Systems Review of Systems  All other systems reviewed and are negative.    Physical Exam Updated Vital Signs BP 139/84 (BP Location: Right Arm)   Pulse 77   Temp 97.7 F (36.5 C) (Oral)   Resp 16   Ht 5\' 6"  (1.676 m)   Wt 90.7 kg   LMP 10/22/2016 (Exact Date)   SpO2 99%   BMI 32.28 kg/m   Physical Exam  Constitutional: She is oriented to person, place, and time. She appears well-developed and well-nourished. No distress.  HENT:  Head: Atraumatic.  Right Ear: External ear normal.  Left Ear: External ear  normal.  Mouth/Throat: Oropharynx is clear and moist.  Mild rhinorrhea  Eyes: Conjunctivae are normal. Pupils are equal, round, and reactive to light.  Neck: Neck supple.  No nuchal rigidity  Cardiovascular: Normal rate and regular rhythm.   Pulmonary/Chest: Effort normal and breath sounds normal.  Abdominal: Soft. There is no tenderness.  Neurological: She is alert and oriented to person, place, and time.  Neurologic exam:  Speech clear, pupils equal round reactive to light,  R eye blindness, visual field not tested Cranial nerves III through XII normal including no facial droop Follows commands, moves all extremities x4, normal strength to bilateral upper and lower extremities at all major muscle groups including grip Sensation normal to light touch and pinprick Coordination not tested due to R eye blindness No pronator drift Gait not tested   Skin: No rash noted.  Psychiatric: She has a normal mood and affect.  Nursing note and vitals reviewed.    ED Treatments / Results  Labs (all labs ordered are listed, but only abnormal results are displayed) Labs Reviewed  CBC WITH DIFFERENTIAL/PLATELET - Abnormal; Notable for the following:       Result Value   WBC 12.0 (*)    Hemoglobin 11.7 (*)    MCH 25.9 (*)    RDW 16.4 (*)    Platelets 407 (*)    Lymphs Abs 4.1 (*)    All other components within normal limits  BASIC METABOLIC PANEL - Abnormal; Notable for the following:    Sodium 133 (*)    Potassium 2.9 (*)    Chloride 100 (*)    Glucose, Bld 137 (*)    Creatinine, Ser 1.40 (*)    Calcium 8.4 (*)    GFR calc non Af Amer 44 (*)    GFR calc Af Amer 51 (*)    All other components within normal limits  RAPID URINE DRUG SCREEN, HOSP PERFORMED - Abnormal; Notable for the following:    Tetrahydrocannabinol POSITIVE (*)    All other components within normal limits  I-STAT TROPOININ, ED - Abnormal; Notable for the following:    Troponin i, poc 0.11 (*)    All other  components within normal limits  URINALYSIS, ROUTINE W REFLEX MICROSCOPIC (NOT AT [facility])  ETHANOL  URINALYSIS, ROUTINE W REFLEX MICROSCOPIC (NOT AT [facility])  POC URINE PREG, ED    EKG  EKG Interpretation None     ED ECG REPORT   Date: 10/27/2016  Rate: 69  Rhythm: normal sinus rhythm  QRS Axis: normal  Intervals: PR prolonged and QT prolonged  ST/T Wave abnormalities: nonspecific T wave changes  Conduction Disutrbances:none  Narrative Interpretation:   Old EKG Reviewed: unchanged  I have personally reviewed the EKG tracing and agree with the computerized printout as noted.   Radiology Ct Head Wo Contrast  Result Date: 10/27/2016 CLINICAL DATA:  Acute onset of right-sided facial numbness. Pain behind the left ear. Initial encounter. EXAM: CT HEAD WITHOUT CONTRAST TECHNIQUE: Contiguous axial images were obtained from the base of the skull through the vertex without intravenous contrast. COMPARISON:  CT of the head and MRI of the brain performed 06/07/2010 FINDINGS: Brain: No evidence of acute infarction, hemorrhage, hydrocephalus, extra-axial collection or mass lesion/mass effect. The posterior fossa, including the cerebellum, brainstem and fourth ventricle, is within normal limits. The third and lateral ventricles, and basal ganglia are unremarkable in appearance. The cerebral hemispheres are symmetric in appearance, with normal gray-white differentiation. No mass effect or midline shift is seen. Vascular: No hyperdense vessel or unexpected calcification. Skull: There is no evidence of fracture; visualized osseous structures are unremarkable in appearance. Sinuses/Orbits: The orbits are within normal limits. The paranasal sinuses and mastoid air cells are well-aerated. Other: No significant soft tissue abnormalities are seen. IMPRESSION: Unremarkable noncontrast CT of the head. Electronically Signed   By: Roanna RaiderJeffery  Chang M.D.   On: 10/27/2016 23:05    Procedures Procedures (including  critical care time)  Medications Ordered in ED Medications  potassium chloride SA (K-DUR,KLOR-CON) CR tablet 40 mEq (40 mEq Oral Given 10/27/16 2310)  sodium chloride 0.9 % bolus 1,000 mL (1,000 mLs Intravenous New Bag/Given 10/27/16 2324)  LORazepam (ATIVAN) injection 1 mg (1 mg Intravenous Given 10/27/16 2327)     Initial Impression / Assessment and Plan / ED Course  I have reviewed the triage vital signs and the nursing notes.  Pertinent labs & imaging results that were available during my care of the patient were reviewed by me and considered in my medical decision making (see chart for details).  Clinical Course     BP 139/84 (BP Location: Right Arm)   Pulse 77   Temp 97.8 F (36.6 C)   Resp 16   Ht 5\' 6"  (1.676 m)   Wt 90.7 kg   LMP 10/22/2016 (Exact Date)   SpO2 99%   BMI 32.28 kg/m    Final Clinical Impressions(s) / ED Diagnoses   Final diagnoses:  Transient cerebral ischemia, unspecified type  Elevated troponin  Hypokalemia    New Prescriptions New Prescriptions   No medications on file   10:45 PM Patient with history of prior stroke presenting today with complaints of headache, right-sided facial numbness and as well as some URI symptoms. She does not have any obvious focal neuro deficit. Her neuro exam is limited due to right eye blindness secondary to prior stroke. No appreciable facial droop, loss of sensation today. Given history of prior stroke, plan to obtain brain MRI for further evaluation. Patient would like benefit from a stroke workup. Patient's last seen normal was yesterday, outside the window for TPA.  10:49 PM There is hypokalemia with a potassium of 2.9, patient is currently on HCTZ which may be the culprit. Will obtain EKG, potassium supplementation given.  Evidence of renal insufficiency with a creatinine of 1.4.  IVF given.    Elevated trop of 0.11.  EKG without U-waves or acute ischemic changes.  New prolonged QT.  Needs to trend troponin.      12:18 AM Head CT scan without acute changes.  Brain MRI ordered.  Appreciate consultation from [facility] Dr. Antionette Charpyd who agrees to see pt in the ER and will admit to telemetry floor, observation status under his care.     Fayrene HelperBowie Katheleen Stella, PA-C 10/28/16 0019    Maia PlanJoshua G Long, MD 10/28/16  1130  

## 2016-10-27 NOTE — Discharge Instructions (Signed)
Go directly to the emergency room for stroke evaluation

## 2016-10-27 NOTE — ED Provider Notes (Signed)
[facility]    CSN: [id number] Arrival date & time: 10/27/16  1845     History   Chief Complaint Chief Complaint  Patient presents with  . Numbness    HPI Kendra Vega is a 45 y.o. female.   HPI Patient presents today with complaints of headache, right facial numbness and gait disturbance 1 day. Patient's history of right eye blindness secondary to CVA 2013. They states she's been having increased venous congestion along with right ear pain. no nausea vomiting. No upper or lower extremity weakness. Past Medical History:  Diagnosis Date  . 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 Hypertension   . Stroke [facility](HCC)     Patient Active Problem List   Diagnosis Date Noted  . Simple cyst of right breast 06/22/2016  . WARTS, HAND 01/19/2011  . CANDIDIASIS 01/19/2011  . OBESITY 01/19/2011  . GASTROESOPHAGEAL REFLUX DISEASE 08/10/2010  . Bipolar depression (HCC) 08/10/2010  . ANXIETY 06/27/2010  . TOBACCO ABUSE 06/27/2010  . VISUAL ACUITY, DECREASED, RIGHT EYE 06/07/2010  . HYPERTENSION, BENIGN ESSENTIAL 06/07/2010    History reviewed. No pertinent surgical history.  OB History    No data available       Home Medications    Prior to Admission medications   Medication Sig Start Date End Date Taking? Authorizing Provider  amLODipine (NORVASC) 5 MG tablet Take 1 tablet (5 mg total) by mouth daily. 06/22/16  Yes Jaclyn ShaggyEnobong Amao, MD  aspirin (ASPIRIN EC) 81 MG EC tablet Take 1 tablet (81 mg total) by mouth daily. Swallow whole. 07/30/13  Yes Ripudeep Jenna LuoK Rai, MD  citalopram (CELEXA) 20 MG tablet Take 20 mg by mouth daily.   Yes Historical Provider, MD  hydrOXYzine (ATARAX/VISTARIL) 25 MG tablet Take 25 mg by mouth 3 (three) times daily as needed.   Yes Historical Provider, MD  lisinopril-hydrochlorothiazide (PRINZIDE,ZESTORETIC) 20-12.5 MG tablet Take 1 tablet by mouth daily. 06/22/16  Yes Jaclyn ShaggyEnobong Amao, MD  Lurasidone HCl 20 MG TABS Take 20 mg by mouth daily.   Yes  Historical Provider, MD  metoprolol (LOPRESSOR) 50 MG tablet Take 1 tablet (50 mg total) by mouth 2 (two) times daily. 06/22/16  Yes Jaclyn ShaggyEnobong Amao, MD  fluconazole (DIFLUCAN) 150 MG tablet Take 1 tablet (150 mg total) by mouth once. May repeat in 1 week if needed Patient not taking: Reported on 06/09/2015 02/17/15   Ambrose FinlandValerie A Keck, NP  polyethylene glycol powder (GLYCOLAX/MIRALAX) powder Take 17 g by mouth daily. Patient not taking: Reported on 06/22/2016 06/09/15   Ambrose FinlandValerie A Keck, NP    Family History Family History  Problem Relation Age of Onset  . Diabetes Mother   . Hypertension Mother   . Hypertension Sister     Social History Social History  Substance Use Topics  . Smoking status: Current Every Day Smoker    Packs/day: 0.50  . Smokeless tobacco: Never Used  . Alcohol use No     Allergies   Patient has no known allergies.   Review of Systems Review of Systems  Constitutional: Positive for activity change. Negative for fever.  HENT: Positive for congestion and ear pain (Right). Negative for hearing loss.   Eyes: Positive for visual disturbance (Right eye blindness).  Respiratory: Negative for cough, chest tightness and shortness of breath.   Cardiovascular: Negative for chest pain.  Genitourinary: Negative.   Musculoskeletal: Positive for gait problem.  Neurological: Positive for numbness (Right facial) and headaches.  Psychiatric/Behavioral: Negative.  Physical Exam Triage Vital Signs ED Triage Vitals  Enc Vitals Group     BP 10/27/16 1912 159/92     Pulse Rate 10/27/16 1912 68     Resp 10/27/16 1912 16     Temp 10/27/16 1912 98.5 F (36.9 C)     Temp Source 10/27/16 1912 Oral     SpO2 10/27/16 1912 98 %     Weight --      Height --      Head Circumference --      Peak Flow --      Pain Score 10/27/16 1917 7     Pain Loc --      Pain Edu? --      Excl. in GC? --    No data found.   Updated Vital Signs BP 159/92 (BP Location: Right Arm)   Pulse  68   Temp 98.5 F (36.9 C) (Oral)   Resp 16   LMP 10/22/2016 (Exact Date)   SpO2 98%   Visual Acuity Right Eye Distance:   Left Eye Distance:   Bilateral Distance:    Right Eye Near:   Left Eye Near:    Bilateral Near:     Physical Exam  Constitutional: She is oriented to person, place, and time. She appears well-developed. No distress.  HENT:  Head: Normocephalic and atraumatic.  Eyes: Conjunctivae are normal.  Neck: Normal range of motion.  Pulmonary/Chest: No respiratory distress.  Abdominal: She exhibits no distension.  Musculoskeletal:  No upper or lower extremity weakness. Patient does have a drifting gait.  Neurological: She is alert and oriented to person, place, and time.  Skin: Skin is warm and dry.  Psychiatric: She has a normal mood and affect.     UC Treatments / Results  Labs (all labs ordered are listed, but only abnormal results are displayed) Labs Reviewed - No data to display  EKG  EKG Interpretation None       Radiology No results found.  Procedures Procedures (including critical care time)  Medications Ordered in UC Medications - No data to display   Initial Impression / Assessment and Plan / UC Course  I have reviewed the triage vital signs and the nursing notes.  Pertinent labs & imaging results that were available during my care of the patient were reviewed by me and considered in my medical decision making (see chart for details).  Clinical Course       Final Clinical Impressions(s) / UC Diagnoses   Final diagnoses:  Right facial numbness  Gait disturbance  Acute nonintractable headache, unspecified headache type    New Prescriptions New Prescriptions   No medications on file  Advised patient that with her current worsening symptoms we recommend she go immediately to the ER. for evaluation. She voiced understanding.   Naida SleightJames M Karron Alvizo, PA-C 10/27/16 1944

## 2016-10-27 NOTE — ED Triage Notes (Signed)
Yesterday noticed headache to the right side of her head. Took 1000mg  Tylenol and went to sleep.  Had been having trouble with her sinuses and right ear pain up until that time. Today denieas headache just some numbness to the right side of her face.  Denies trouble eating( eating in triage) or swallowing.  When face touched on left and right side states it feels the same.   Smile is symmetrical.

## 2016-10-27 NOTE — ED Notes (Signed)
Patient transported to CT 

## 2016-10-28 ENCOUNTER — Observation Stay (HOSPITAL_COMMUNITY): Payer: Self-pay

## 2016-10-28 ENCOUNTER — Inpatient Hospital Stay (HOSPITAL_COMMUNITY): Payer: Self-pay

## 2016-10-28 ENCOUNTER — Encounter (HOSPITAL_COMMUNITY): Payer: Self-pay | Admitting: Family Medicine

## 2016-10-28 ENCOUNTER — Observation Stay (HOSPITAL_BASED_OUTPATIENT_CLINIC_OR_DEPARTMENT_OTHER): Payer: Self-pay

## 2016-10-28 DIAGNOSIS — N183 Chronic kidney disease, stage 3 unspecified: Secondary | ICD-10-CM | POA: Diagnosis present

## 2016-10-28 DIAGNOSIS — E876 Hypokalemia: Secondary | ICD-10-CM | POA: Diagnosis present

## 2016-10-28 DIAGNOSIS — I6789 Other cerebrovascular disease: Secondary | ICD-10-CM

## 2016-10-28 DIAGNOSIS — I639 Cerebral infarction, unspecified: Principal | ICD-10-CM

## 2016-10-28 DIAGNOSIS — N1832 Chronic kidney disease, stage 3b: Secondary | ICD-10-CM | POA: Diagnosis present

## 2016-10-28 DIAGNOSIS — Z8673 Personal history of transient ischemic attack (TIA), and cerebral infarction without residual deficits: Secondary | ICD-10-CM | POA: Diagnosis present

## 2016-10-28 DIAGNOSIS — R7989 Other specified abnormal findings of blood chemistry: Secondary | ICD-10-CM

## 2016-10-28 DIAGNOSIS — R9431 Abnormal electrocardiogram [ECG] [EKG]: Secondary | ICD-10-CM

## 2016-10-28 DIAGNOSIS — I10 Essential (primary) hypertension: Secondary | ICD-10-CM

## 2016-10-28 DIAGNOSIS — R778 Other specified abnormalities of plasma proteins: Secondary | ICD-10-CM | POA: Diagnosis present

## 2016-10-28 DIAGNOSIS — E871 Hypo-osmolality and hyponatremia: Secondary | ICD-10-CM | POA: Diagnosis present

## 2016-10-28 DIAGNOSIS — R748 Abnormal levels of other serum enzymes: Secondary | ICD-10-CM

## 2016-10-28 DIAGNOSIS — F3130 Bipolar disorder, current episode depressed, mild or moderate severity, unspecified: Secondary | ICD-10-CM

## 2016-10-28 DIAGNOSIS — J069 Acute upper respiratory infection, unspecified: Secondary | ICD-10-CM

## 2016-10-28 DIAGNOSIS — R200 Anesthesia of skin: Secondary | ICD-10-CM | POA: Diagnosis present

## 2016-10-28 LAB — BASIC METABOLIC PANEL
Anion gap: 9 (ref 5–15)
BUN: 13 mg/dL (ref 6–20)
CHLORIDE: 103 mmol/L (ref 101–111)
CO2: 25 mmol/L (ref 22–32)
Calcium: 8.2 mg/dL — ABNORMAL LOW (ref 8.9–10.3)
Creatinine, Ser: 1.24 mg/dL — ABNORMAL HIGH (ref 0.44–1.00)
GFR calc non Af Amer: 51 mL/min — ABNORMAL LOW (ref >60)
GFR, EST AFRICAN AMERICAN: 59 mL/min — AB (ref >60)
Glucose, Bld: 144 mg/dL — ABNORMAL HIGH (ref 65–99)
POTASSIUM: 3.5 mmol/L (ref 3.5–5.1)
SODIUM: 137 mmol/L (ref 135–145)

## 2016-10-28 LAB — ECHOCARDIOGRAM COMPLETE
Height: 66 in
WEIGHTICAEL: 3200 [oz_av]

## 2016-10-28 LAB — GLUCOSE, CAPILLARY
GLUCOSE-CAPILLARY: 120 mg/dL — AB (ref 65–99)
GLUCOSE-CAPILLARY: 115 mg/dL — AB (ref 65–99)

## 2016-10-28 LAB — TROPONIN I
Troponin I: <0.03 ng/mL (ref <0.03)
Troponin I: <0.03 ng/mL (ref <0.03)

## 2016-10-28 LAB — LIPID PANEL
CHOLESTEROL: 164 mg/dL (ref 0–200)
HDL: 32 mg/dL — AB (ref >40)
LDL Cholesterol: 76 mg/dL (ref 0–99)
TRIGLYCERIDES: 281 mg/dL — AB (ref <150)
Total CHOL/HDL Ratio: 5.1 RATIO
VLDL: 56 mg/dL — ABNORMAL HIGH (ref 0–40)

## 2016-10-28 LAB — MAGNESIUM: Magnesium: 2.3 mg/dL (ref 1.7–2.4)

## 2016-10-28 MED ORDER — GADOBENATE DIMEGLUMINE 529 MG/ML IV SOLN
18.0000 mL | Freq: Once | INTRAVENOUS | Status: AC | PRN
  Administered 2016-10-28: 18 mL via INTRAVENOUS

## 2016-10-28 MED ORDER — ACETAMINOPHEN 500 MG PO TABS
1000.0000 mg | ORAL_TABLET | Freq: Four times a day (QID) | ORAL | Status: DC | PRN
  Administered 2016-10-28 (×2): 1000 mg via ORAL
  Filled 2016-10-28 (×3): qty 2

## 2016-10-28 MED ORDER — CLOPIDOGREL BISULFATE 75 MG PO TABS
75.0000 mg | ORAL_TABLET | Freq: Every day | ORAL | Status: DC
  Administered 2016-10-29: 75 mg via ORAL
  Filled 2016-10-28: qty 1

## 2016-10-28 MED ORDER — ATORVASTATIN CALCIUM 10 MG PO TABS: 20.0000 mg | ORAL_TABLET | Freq: Every day | ORAL | Status: DC

## 2016-10-28 MED ORDER — POLYETHYLENE GLYCOL 3350 17 GM/SCOOP PO POWD: 17.0000 g | Freq: Every day | ORAL | Status: DC | PRN

## 2016-10-28 MED ORDER — POLYETHYLENE GLYCOL 3350 17 G PO PACK
17.0000 g | PACK | Freq: Every day | ORAL | Status: DC | PRN
  Administered 2016-10-28: 17 g via ORAL
  Filled 2016-10-28: qty 1

## 2016-10-28 MED ORDER — ASPIRIN 300 MG RE SUPP: 300.0000 mg | Freq: Every day | RECTAL | Status: DC

## 2016-10-28 MED ORDER — INSULIN ASPART 100 UNIT/ML ~~LOC~~ SOLN: 0.0000 [IU] | Freq: Three times a day (TID) | SUBCUTANEOUS | Status: DC

## 2016-10-28 MED ORDER — CITALOPRAM HYDROBROMIDE 10 MG PO TABS: 20.0000 mg | ORAL_TABLET | Freq: Every day | ORAL | Status: DC

## 2016-10-28 MED ORDER — CITALOPRAM HYDROBROMIDE 10 MG PO TABS
20.0000 mg | ORAL_TABLET | Freq: Every day | ORAL | Status: DC
  Administered 2016-10-29: 20 mg via ORAL
  Filled 2016-10-28: qty 2

## 2016-10-28 MED ORDER — SENNOSIDES-DOCUSATE SODIUM 8.6-50 MG PO TABS
1.0000 | ORAL_TABLET | Freq: Every evening | ORAL | Status: DC | PRN
  Administered 2016-10-28: 1 via ORAL
  Filled 2016-10-28: qty 1

## 2016-10-28 MED ORDER — INSULIN ASPART 100 UNIT/ML ~~LOC~~ SOLN: 0.0000 [IU] | Freq: Every day | SUBCUTANEOUS | Status: DC

## 2016-10-28 MED ORDER — ENOXAPARIN SODIUM 40 MG/0.4ML ~~LOC~~ SOLN
40.0000 mg | SUBCUTANEOUS | Status: DC
  Administered 2016-10-28 – 2016-10-29 (×2): 40 mg via SUBCUTANEOUS
  Filled 2016-10-28 (×2): qty 0.4

## 2016-10-28 MED ORDER — NICOTINE 21 MG/24HR TD PT24
21.0000 mg | MEDICATED_PATCH | Freq: Every day | TRANSDERMAL | Status: DC
  Administered 2016-10-28 – 2016-10-29 (×2): 21 mg via TRANSDERMAL
  Filled 2016-10-28 (×2): qty 1

## 2016-10-28 MED ORDER — ASPIRIN 325 MG PO TABS
325.0000 mg | ORAL_TABLET | Freq: Every day | ORAL | Status: DC
  Administered 2016-10-28: 325 mg via ORAL
  Filled 2016-10-28: qty 1

## 2016-10-28 MED ORDER — MAGNESIUM SULFATE 2 GM/50ML IV SOLN
2.0000 g | Freq: Once | INTRAVENOUS | Status: AC
  Administered 2016-10-28: 2 g via INTRAVENOUS
  Filled 2016-10-28: qty 50

## 2016-10-28 MED ORDER — STROKE: EARLY STAGES OF RECOVERY BOOK
Freq: Once | Status: AC
  Administered 2016-10-28: 02:00:00
  Filled 2016-10-28: qty 1

## 2016-10-28 MED ORDER — LABETALOL HCL 5 MG/ML IV SOLN: 5.0000 mg | INTRAVENOUS | Status: AC | PRN

## 2016-10-28 MED ORDER — LURASIDONE HCL 20 MG PO TABS
20.0000 mg | ORAL_TABLET | Freq: Every day | ORAL | Status: DC
  Administered 2016-10-28 (×2): 20 mg via ORAL
  Filled 2016-10-28 (×2): qty 1

## 2016-10-28 MED ORDER — POTASSIUM CHLORIDE IN NACL 40-0.9 MEQ/L-% IV SOLN
INTRAVENOUS | Status: AC
  Administered 2016-10-28: 75 mL/h via INTRAVENOUS
  Filled 2016-10-28: qty 1000

## 2016-10-28 NOTE — H&P (Signed)
History and Physical    Kendra FlackLiz Vega UJW:[id number] DOB: 10-19-1971 DOA: 10/27/2016  PCP: Ambrose FinlandValerie A Keck, NP   Patient coming from: Home, by way of urgent care   Chief Complaint: Right facial numbness  HPI: Kendra FlackLiz Baumert is a 45 y.o. 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 Patient reports that she had been in her usual state of health until she noticed the insidious development of sinus congestion, sore throat, and right ear fullness beginning 2 days ago. There was no associated fevers, chills, or cough with this. She also developed a right-sided headache the day prior to her presentation and then woke on the morning of 10/27/2016 at approximately 8 AM with numbness involving the right scalp and right face. Numbness persisted throughout the day as the sinus congestion and headache improved and nearly resolved. Patient denies change in vision or hearing and denies extremity numbness or weakness. There has been no confusion or speech difficulty. Patient does endorse some mild difficulty with coordination and has felt as though she is falling to the right side. There has been no fall. Patient sought evaluation for this in an urgent care this evening and, with concern for possible CVA, was directed to the ED for evaluation.  ED Course: Upon arrival to the ED, patient is found to be afebrile, saturating well on room air, and with vital signs stable. EKG demonstrates a sinus rhythm with flattening of the T waves in anterolateral leads and a QTc prolonged to 523 ms. Noncontrast head CT is unremarkable, chemistry panel features a sodium of 133, potassium of 2.9, and creatinine 1.40, up from 1.25 earlier this year. CBC features a leukocytosis to 12,000 with  thrombocytosis of 407,000 and a mild normocytic anemia with hemoglobin of 11.7. UDS is positive for THC only, ethanol level was undetectable, and urinalysis is unremarkable. Troponin is elevated to a value of 0.11 and the patient continues to deny chest pain or dyspnea. 1 L of normal saline was administered in the ED and the patient was treated with 40 mEq oral potassium. MRI brain was obtained and findings are consistent with a 6 mm acute ischemic right lateral medullary infarct without hemorrhage or mass effect. Patient has remained hemodynamically stable and in no respiratory distress and she will be observed on the telemetry unit for ongoing evaluation and management of acute CVA.  Review of Systems:  All other systems reviewed and apart from HPI, are negative.  Past Medical History:  Diagnosis Date  . 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 Hypertension   . Stroke [facility](HCC)     History reviewed. No pertinent surgical history.   reports that she has been smoking.  She has been smoking about 0.50 packs per day. She has never used smokeless tobacco. She reports that she uses drugs, including Marijuana, about 1 time per week. She reports that she does not drink alcohol.  No Known Allergies  Family History  Problem Relation Age of Onset  . Diabetes Mother   . Hypertension Mother   . Hypertension Sister      Prior to Admission medications   Medication Sig Start Date End Date Taking? Authorizing Provider  acetaminophen (TYLENOL) 500 MG tablet Take 1,000 mg by mouth every 6 (six)  hours as needed.   Yes Historical Provider, MD  amLODipine (NORVASC) 5 MG tablet Take 1 tablet (5 mg total) by mouth daily. 06/22/16  Yes Jaclyn Shaggy, MD  aspirin (ASPIRIN EC) 81 MG EC tablet Take 1 tablet (81 mg total) by mouth daily. Swallow whole. 07/30/13  Yes Ripudeep Jenna Luo, MD  citalopram (CELEXA) 20 MG tablet Take 20 mg by mouth daily.   Yes Historical Provider, MD  hydrOXYzine (ATARAX/VISTARIL) 25 MG  tablet Take 25 mg by mouth 3 (three) times daily as needed.   Yes Historical Provider, MD  lisinopril-hydrochlorothiazide (PRINZIDE,ZESTORETIC) 20-12.5 MG tablet Take 1 tablet by mouth daily. 06/22/16  Yes Jaclyn Shaggy, MD  Lurasidone HCl 20 MG TABS Take 20 mg by mouth at bedtime.    Yes Historical Provider, MD  metoprolol (LOPRESSOR) 50 MG tablet Take 1 tablet (50 mg total) by mouth 2 (two) times daily. 06/22/16  Yes Jaclyn Shaggy, MD  polyethylene glycol powder (GLYCOLAX/MIRALAX) powder Take 17 g by mouth daily. Patient taking differently: Take 17 g by mouth daily as needed for moderate constipation.  06/09/15  Yes Ambrose Finland, NP  fluconazole (DIFLUCAN) 150 MG tablet Take 1 tablet (150 mg total) by mouth once. May repeat in 1 week if needed Patient not taking: Reported on 06/09/2015 02/17/15   Ambrose Finland, NP    Physical Exam: Vitals:   10/27/16 2204 10/27/16 2304 10/27/16 2330 10/28/16 0050  BP: 139/84  136/83 137/84  Pulse: 77  67 72  Resp: 16  16 13   Temp: 97.7 F (36.5 C) 97.8 F (36.6 C)    TempSrc: Oral     SpO2: 99%  99% 100%  Weight: 90.7 kg (200 lb)     Height: 5\' 6"  (1.676 m)         Constitutional: NAD, calm, comfortable Eyes: PERTLA, lids and conjunctivae normal ENMT: Mucous membranes are moist. Posterior pharynx clear of any exudate or lesions.   Neck: normal, supple, no masses, no thyromegaly Respiratory: clear to auscultation bilaterally, no wheezing, no crackles. Normal respiratory effort.   Cardiovascular: S1 & S2 heard, regular rate and rhythm, no significant murmur. No carotid bruits. No significant JVD. Abdomen: No distension, no tenderness, no masses palpated. Bowel sounds normal.  Musculoskeletal: no clubbing / cyanosis. No joint deformity upper and lower extremities. Normal muscle tone.  Skin: no significant rashes, lesions, ulcers. Warm, dry, well-perfused. Neurologic: No gross facial asymmetry, sensation to light touch is diminished at 1st and 2nd right  CN V distributions. Bicipital and patellar DTR normal. Strength 5/5 in all 4 limbs; sensation intact throughout distal extremities x4.  Psychiatric: Normal judgment and insight. Alert and oriented x 3. Normal mood and affect.     Labs on Admission: I have personally reviewed following labs and imaging studies  CBC:  Recent Labs Lab 10/27/16 2110  WBC 12.0*  NEUTROABS 7.2  HGB 11.7*  HCT 36.2  MCV 80.1  PLT 407*   Basic Metabolic Panel:  Recent Labs Lab 10/27/16 2110  NA 133*  K 2.9*  CL 100*  CO2 23  GLUCOSE 137*  BUN 10  CREATININE 1.40*  CALCIUM 8.4*   GFR: Estimated Creatinine Clearance: 57 mL/min (by C-G formula based on SCr of 1.4 mg/dL (H)). Liver Function Tests: No results for input(s): AST, ALT, ALKPHOS, BILITOT, PROT, ALBUMIN in the last 168 hours. No results for input(s): LIPASE, AMYLASE in the last 168 hours. No results for input(s): AMMONIA in the last 168 hours. Coagulation Profile: No  results for input(s): INR, PROTIME in the last 168 hours. Cardiac Enzymes: No results for input(s): CKTOTAL, CKMB, CKMBINDEX, TROPONINI in the last 168 hours. BNP (last 3 results) No results for input(s): PROBNP in the last 8760 hours. HbA1C: No results for input(s): HGBA1C in the last 72 hours. CBG: No results for input(s): GLUCAP in the last 168 hours. Lipid Profile: No results for input(s): CHOL, HDL, LDLCALC, TRIG, CHOLHDL, LDLDIRECT in the last 72 hours. Thyroid Function Tests: No results for input(s): TSH, T4TOTAL, FREET4, T3FREE, THYROIDAB in the last 72 hours. Anemia Panel: No results for input(s): VITAMINB12, FOLATE, FERRITIN, TIBC, IRON, RETICCTPCT in the last 72 hours. Urine analysis:    Component Value Date/Time   COLORURINE YELLOW 10/27/2016 2044   APPEARANCEUR CLEAR 10/27/2016 2044   LABSPEC 1.022 10/27/2016 2044   PHURINE 6.0 10/27/2016 2044   GLUCOSEU NEGATIVE 10/27/2016 2044   HGBUR NEGATIVE 10/27/2016 2044   HGBUR negative 01/19/2011 1016    BILIRUBINUR NEGATIVE 10/27/2016 2044   KETONESUR NEGATIVE 10/27/2016 2044   PROTEINUR NEGATIVE 10/27/2016 2044   UROBILINOGEN 0.2 01/19/2011 1016   NITRITE NEGATIVE 10/27/2016 2044   LEUKOCYTESUR NEGATIVE 10/27/2016 2044   Sepsis Labs: @LABRCNTIP (procalcitonin:4,lacticidven:4) )No results found for this or any previous visit (from the past 240 hour(s)).   Radiological Exams on Admission: Ct Head Wo Contrast  Result Date: 10/27/2016 CLINICAL DATA:  Acute onset of right-sided facial numbness. Pain behind the left ear. Initial encounter. EXAM: CT HEAD WITHOUT CONTRAST TECHNIQUE: Contiguous axial images were obtained from the base of the skull through the vertex without intravenous contrast. COMPARISON:  CT of the head and MRI of the brain performed 06/07/2010 FINDINGS: Brain: No evidence of acute infarction, hemorrhage, hydrocephalus, extra-axial collection or mass lesion/mass effect. The posterior fossa, including the cerebellum, brainstem and fourth ventricle, is within normal limits. The third and lateral ventricles, and basal ganglia are unremarkable in appearance. The cerebral hemispheres are symmetric in appearance, with normal gray-white differentiation. No mass effect or midline shift is seen. Vascular: No hyperdense vessel or unexpected calcification. Skull: There is no evidence of fracture; visualized osseous structures are unremarkable in appearance. Sinuses/Orbits: The orbits are within normal limits. The paranasal sinuses and mastoid air cells are well-aerated. Other: No significant soft tissue abnormalities are seen. IMPRESSION: Unremarkable noncontrast CT of the head. Electronically Signed   By: Roanna Raider M.D.   On: 10/27/2016 23:05   Mr Brain Wo Contrast  Result Date: 10/28/2016 CLINICAL DATA:  Initial evaluation for acute headache, right facial numbness. EXAM: MRI HEAD WITHOUT CONTRAST TECHNIQUE: Multiplanar, multiecho pulse sequences of the brain and surrounding structures were  obtained without intravenous contrast. COMPARISON:  Prior CT from earlier the same day. FINDINGS: Brain: Study mildly degraded by motion artifact. Cerebral volume within normal limits for patient age. Minimal patchy T2/FLAIR hyperintensity within the periventricular and deep white matter both cerebral hemispheres, nonspecific, but most like related to minimal chronic small vessel ischemic disease, within normal limits for age. There is a subtle 6 mm focus of restricted diffusion within the right lateral medulla, compatible with a small acute ischemic infarct (series 3, image 10 on axial DWI sequence, series 4, image 21 on coronal DWI sequence. No associated hemorrhage or mass effect. No other evidence for acute or subacute ischemia. Gray-white matter differentiation otherwise maintained. No evidence for acute or chronic intracranial hemorrhage. No areas of encephalomalacia to suggest prior infarction. No mass lesion, midline shift, or mass effect. Ventricles normal in size without evidence hydrocephalus. No extra-axial fluid  collection. Major dural sinuses are grossly patent. Incidental note made of a partially empty sella. Suprasellar region normal. Midline structures intact. Vascular: Major intracranial vascular flow voids are maintained. Skull and upper cervical spine: Craniocervical junction normal. Visualized upper cervical spine within normal limits. Focal well-circumscribed lesion within the central aspect of the clivus is stable relative to prior MRI from 2011, consistent with a benign lesion. Bone marrow signal intensity otherwise within normal limits. No scalp soft tissue abnormality. Sinuses/Orbits: Globes and orbital soft tissues within normal limits. Mild scattered mucosal thickening within the maxillary sinuses and ethmoidal air cells. Paranasal sinuses are otherwise clear. No mastoid effusion. Inner ear structures grossly normal. IMPRESSION: 1. 6 mm acute ischemic right lateral medullary infarct. No  associated hemorrhage or mass effect. 2. Otherwise normal MRI of the brain for patient age. Electronically Signed   By: Rise Mu M.D.   On: 10/28/2016 00:39    EKG: Independently reviewed. Sinus rhythm, T-wave flattening anterolateral leads, QTc 523 ms, no prior for comparison   Assessment/Plan  1. Acute ischemic CVA  - Pt presents with right scalp and facial numbness; MRI reveals acute ischemic right lateral medullary infarct without hemorrhage or mass-effect  - tPA was not given as she presented outside of the acceptable timeframe - Evaluate for etiology with carotid dopplers and TTE - She has passed swallow screen and will be allowed a diet  - Plan to monitor on telemetry, maintain euglycemia, normothermia, euvolemia; permit HTN in acute-phase  - PT and OT evals requested; will check A1c and fasting lipid panel  - Continue ASA, VTE ppx    2. Troponin elevation  - Troponin is elevated to 0.11 on admission with no anginal complaints and no known CAD history  - EKG features flattening of T-waves in lateral leads without prior for comparison  - Full-dose ASA has been given  - Plan to monitor on telemetry for ischemic changes, obtain serial troponin measurements, and repeat EKG in am   - TTE is ordered as above    3. Hypokalemia, hyponatremia  - Serum potassium is 2.9 on admission and sodium is 133  - Suspected secondary to HCTZ, which is held on admission  - 40 mEq oral potassium given in ED and 40 mEq KCl added to IVF; 2 g IV magnesium given on admission  - Monitor on telemetry, hold HCTZ, repeat chem panel with magnesium level in am    4. Acute URI  - Pt presents with 2-3 days sinus congestion, right ear fullness, and sore throat  - Sxs are mild and reportedly improving  - Suspect an acute viral URI and will continue supportive care with prn APAP    5. CKD stage III  - SCr is 1.40 on admission, up from 1.25 in January 2017  - She appears roughly euvolemic on admission   - Lisinopril is held on admission - Repeat chem panel in am   6. Hypertension  - Pt is managed at home with HCTZ, lisinopril, Norvasc, and Lopressor  - Antihypertensives held on admission given acute-phase of ischemic CVA  - Treat prn for SBP > 220, or DBP > 110 with labetalol IVPs   7. Bipolar disorder  - Stable on admission, pt denies SI, HI, or hallucinations  - Continue Latuda; Celexa will be held for one dose in light of prolonged QTc while correcting electrolytes    8. Prolonged QTc  - QTc is 523 on admission with hypokalemia likely contributing  - Potassium is being replaced  as above; repeat chem panel with mag level in am  - Avoid offending medications where possible  - Monitor on telemetry     DVT prophylaxis: sq Lovenox Code Status: Full  Family Communication: Discussed with patient Disposition Plan: Observe on telemetry Consults called: None Admission status: Observation    Briscoe Deutscherimothy S Jaecob Lowden, MD [facility] Pager [phone]  If 7PM-7AM, please contact night-coverage [email or web address] Password [email or web address]  10/28/2016, 1:10 AM

## 2016-10-28 NOTE — Progress Notes (Signed)
PT Cancellation Note  Patient Details Name: Kendra Vega MRN: [id number] DOB: December 01, 1970   Cancelled Treatment:    Reason Eval/Treat Not Completed: Patient at procedure or test/unavailable - will reattempt next date.   Narda AmberMartin, Delina Kruczek [facility] 10/28/2016, 3:15 PM

## 2016-10-28 NOTE — Progress Notes (Signed)
  Echocardiogram 2D Echocardiogram has been performed.  Kendra Vega, Kendra Vega 10/28/2016, 9:32 AM

## 2016-10-28 NOTE — Consult Note (Signed)
Neurology Consult Note  Reason for Consultation: Stroke  Requesting provider: Albertine GratesFang Xu, MD  CC: R face numbness  HPI: This is a 45-yo RH woman who presented to the ED last night for the evaluation of R face numbness. History is obtained directly from the patient who is a good historian.   She states that she woke up on the morning of 10/27/16 with numbness of the R face. This was associated with some pain around the R ear that radiated across the R cheek. She thought that she had an ear infection because she has been dealing with some sinus congestion, sore throat and fullness in the R ear for the preceding couple of days. She states that her congestion and pain resolved but the numbness persisted and she decided to come to the ED to be evaluated. She denies any other numbness but says that her right side feels "off" and she has had some mild difficulty with her balance and trouble swallowing. 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 MRI was obtained in the ED and showed an acute R lateral medullary infarct. She was admitted for stroke evaluation and neurology consult is requested.   Last known well: 10/26/16 before going to bed NHISS score: 1 mRS score: 0 tPA given?: No, outside of window   PMH:  Past Medical History:  Diagnosis Date  . 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 Hypertension   . Stroke [facility](HCC)     PSH:  History reviewed. No pertinent surgical history.  Family history: Family History  Problem Relation Age of Onset  . Diabetes Mother   . Hypertension Mother   . Hypertension Sister     Social history:  Social History   Social History  . Marital status: Single    Spouse name: N/A  . Number of children: N/A  . Years of education: N/A   Occupational History  . Not on file.   Social History Main Topics  . Smoking status: Current Every Day Smoker    Packs/day: 0.50    Years: 7.00  .  Smokeless tobacco: Never Used  . Alcohol use No  . Drug use:     Frequency: 1.0 time per week    Types: Marijuana     Comment: last used marijuana last night, 06/08/15  . Sexual activity: Not on file   Other Topics Concern  . Not on file   Social History Narrative  . No narrative on file    Current outpatient meds: Current Meds  Medication Sig  . acetaminophen (TYLENOL) 500 MG tablet Take 1,000 mg by mouth every 6 (six) hours as needed.  Marland Kitchen. amLODipine (NORVASC) 5 MG tablet Take 1 tablet (5 mg total) by mouth daily.  Marland Kitchen. aspirin (ASPIRIN EC) 81 MG EC tablet Take 1 tablet (81 mg total) by mouth daily. Swallow whole.  . cetirizine (ZYRTEC) 10 MG chewable tablet Chew 10 mg by mouth daily as needed for allergies.  . Chlorpheniramine-APAP (CORICIDIN) 2-325 MG TABS Take 1 tablet by mouth daily as needed (cold).  . citalopram (CELEXA) 20 MG tablet Take 20 mg by mouth daily.  . hydrOXYzine (ATARAX/VISTARIL) 25 MG tablet Take 25 mg by mouth 3 (three) times daily.   Marland Kitchen. lisinopril-hydrochlorothiazide (PRINZIDE,ZESTORETIC) 20-12.5 MG tablet Take 1 tablet by mouth daily.  . Lurasidone HCl 20 MG TABS Take 20 mg by mouth at bedtime.   . metoprolol (LOPRESSOR)  50 MG tablet Take 1 tablet (50 mg total) by mouth 2 (two) times daily.  . polyethylene glycol powder (GLYCOLAX/MIRALAX) powder Take 17 g by mouth daily. (Patient taking differently: Take 17 g by mouth daily as needed for moderate constipation. )    Current inpatient meds:  Current Facility-Administered Medications  Medication Dose Route Frequency Provider Last Rate Last Dose  . acetaminophen (TYLENOL) tablet 1,000 mg  1,000 mg Oral Q6H PRN Briscoe Deutscherimothy S Opyd, MD   1,000 mg at 10/28/16 1110  . aspirin suppository 300 mg  300 mg Rectal Daily Briscoe Deutscherimothy S Opyd, MD       Or  . aspirin tablet 325 mg  325 mg Oral Daily Briscoe Deutscherimothy S Opyd, MD   325 mg at 10/28/16 0847  . [START ON 10/29/2016] citalopram (CELEXA) tablet 20 mg  20 mg Oral Daily Rieley Hausman S Opyd, MD       . enoxaparin (LOVENOX) injection 40 mg  40 mg Subcutaneous Q24H Briscoe Deutscherimothy S Opyd, MD   40 mg at 10/28/16 0847  . insulin aspart (novoLOG) injection 0-5 Units  0-5 Units Subcutaneous QHS Maximos Zayas S Opyd, MD      . insulin aspart (novoLOG) injection 0-9 Units  0-9 Units Subcutaneous TID WC Aislynn Cifelli S Opyd, MD      . labetalol (NORMODYNE,TRANDATE) injection 5 mg  5 mg Intravenous Q2H PRN Lavone Neriimothy S Opyd, MD      . lurasidone (LATUDA) tablet 20 mg  20 mg Oral QHS Briscoe Deutscherimothy S Opyd, MD   20 mg at 10/28/16 0138  . nicotine (NICODERM CQ - dosed in mg/24 hours) patch 21 mg  21 mg Transdermal Daily Albertine GratesFang Xu, MD   21 mg at 10/28/16 1515  . polyethylene glycol (MIRALAX / GLYCOLAX) packet 17 g  17 g Oral Daily PRN Lavone Neriimothy S Opyd, MD      . senna-docusate (Senokot-S) tablet 1 tablet  1 tablet Oral QHS PRN Briscoe Deutscherimothy S Opyd, MD        Allergies: No Known Allergies  ROS: As per HPI. A full 14-point review of systems was performed and is otherwise unremarkable.   PE:  BP (!) 153/81 (BP Location: Left Arm)   Pulse 80   Temp 98 F (36.7 C) (Oral)   Resp 18   Ht 5\' 6"  (1.676 m)   Wt 90.7 kg (200 lb)   LMP 10/22/2016 (Exact Date)   SpO2 100%   BMI 32.28 kg/m   General: WDWN, no acute distress. AAO x4. Speech clear, no dysarthria. No aphasia. Follows commands briskly. Affect is bright with congruent mood. Comportment is normal.  HEENT: Normocephalic. Neck supple without LAD. MMM, OP clear. Dentition fair with several missing teeth. Sclerae anicteric. No conjunctival injection.  CV: Regular, no murmur. Carotid pulses full and symmetric, no bruits. Distal pulses 2+ and symmetric.  Lungs: CTAB.  Abdomen: Soft, obese, non-distended, non-tender. Bowel sounds present x4.  Extremities: No C/C/E. Neuro:  CN: Pupils are equal and round. They are symmetrically reactive from 3-->2 mm. She has no vision in the R eye. Eyes are dysconjugate with alternating lateral deviation of the eyes depending on gaze. No nystagmus. No  reported diplopia. Facial sensation is decreased to light touch and pinprick on the R face, mainly involving the V1 and V2 distributions. Face is symmetric at rest with normal strength and mobility. Hearing is intact to conversational voice. Palate elevates symmetrically and uvula is midline. Voice is normal in tone, pitch and quality. Bilateral SCM and trapezii are 5/5. Tongue  is midline with normal bulk and mobility.  Motor: Normal bulk, tone, and strength. No tremor or other abnormal movements. No drift.  Sensation: Intact to light touch. Pinprick is reduced in the LLE but symmetric and intact in both arms. Vibration is mildly impaired in both feet.  DTRs: 2+, symmetric. Toes downgoing bilaterally. No pathologic reflexes.  Coordination: Finger-to-nose and heel-to-shin are without dysmetria. Finger taps are normal in amplitude and speed, no decrement.    Labs:  Lab Results  Component Value Date   WBC 12.0 (H) 10/27/2016   HGB 11.7 (L) 10/27/2016   HCT 36.2 10/27/2016   PLT 407 (H) 10/27/2016   GLUCOSE 144 (H) 10/28/2016   CHOL 164 10/28/2016   TRIG 281 (H) 10/28/2016   HDL 32 (L) 10/28/2016   LDLCALC 76 10/28/2016   ALT 13 06/09/2015   AST 16 06/09/2015   NA 137 10/28/2016   K 3.5 10/28/2016   CL 103 10/28/2016   CREATININE 1.24 (H) 10/28/2016   BUN 13 10/28/2016   CO2 25 10/28/2016   TSH 2.262 01/19/2011   INR 0.96 06/07/2010   HGBA1C (H) 06/07/2010    5.8 (NOTE)                                                                       According to the ADA Clinical Practice Recommendations for 2011, when HbA1c is used as a screening test:   >=6.5%   Diagnostic of Diabetes Mellitus           (if abnormal result  is confirmed)  5.7-6.4%   Increased risk of developing Diabetes Mellitus  References:Diagnosis and Classification of Diabetes Mellitus,Diabetes Care,2011,34(Suppl 1):S62-S69 and Standards of Medical Care in         Diabetes - 2011,Diabetes Care,2011,34  (Suppl 1):S11-S61.    MICROALBUR 0.93 01/19/2011   UA negative UDS positive for THC EtOH <5 Troponin 0.11-->0.03-->0.03-->0.03  Imaging:  I have personally and independently reviewed the MRI of the brain with and without contrast from 10/28/16. This shows a small area of restricted diffusion involving the dorsolateral aspect of the R medulla, consistent with acute ischemic infarction. The remainder of the brain is unremarkable.   Other diagnostic studies:  TTE 10/28/16: There is moderate concentric LVH with normal systolic function. EF 55-60%. Moderate dilatation of the L atrium.   Assessment and Plan:  1. Acute Ischemic Stroke: This is an acute stroke involving the R PICA territory. . Known risk factors for cerebrovascular disease in this patient include DM, HTN, and tobacco abuse. Additional workup will be ordered to include MRA of the head and neck to assess posterior circulation--will cancel carotid Dopplers if not already done. TTE did not show an embolic source. LDL is near goal at 76. Hemoglobin a1c is pending. Further testing will be determined by results from these initial studies. Recommend antiplatelet therapy with aspirin 325 mg daily for secondary stroke prevention once cleared to take oral medications. Consider statin with goal LDL less than 70. Ensure adequate glucose control. Allow permissive hypertension in the acute phase, treating only SBP greater than 220 mmHg and/or DBP greater than 110 mmHg. Avoid fever and hyperglycemia as these can extend the infarct. Avoid hypotonic IVF to minimize exacerbation of post-stroke edema. Initiate  rehab services. DVT prophylaxis as needed.   2. R face numbness: Acute, consistent with R medullary infarct. Follow.  3. LLE numbness: This is acute, consistent with R medullary infarct. This is mild and she was unaware of it before the exam. Follow.   This was discussed with the patient and she is in agreement with the plan as noted. She was given the opportunity to ask any  questions and these were addressed to her satisfaction.   Thank you for this consult. The stroke team will assume care of the patient starting 10/29/16. Please call with any questions.

## 2016-10-28 NOTE — ED Notes (Addendum)
Pt was seen at Urgent Care today for facial numbness, right sided ear pain and unsteady gait. Urgent care sent her to the ED . Pt NIH is a 0 and neuro checks continue to be at pateint's baseline. No changes noted during assessment. Ct scan was negative. Pt given 1 liter fluid bolus that is infusing at this time. Pt given 1 mg Ativan prior to MRI scan.

## 2016-10-28 NOTE — Progress Notes (Signed)
EKG completed and in chart for this AM.   Sim BoastHavy, RN

## 2016-10-28 NOTE — Progress Notes (Signed)
VASCULAR LAB PRELIMINARY  PRELIMINARY  PRELIMINARY  PRELIMINARY  Carotid duplex completed.    Preliminary report:  1-39% ICA plaquing. Vertebral artery flow is antegrade.   Carola Viramontes, RVT 10/28/2016, 3:34 PM

## 2016-10-28 NOTE — Progress Notes (Addendum)
PROGRESS NOTE  Jennings Stirling ZOX:[id number] DOB: 03-08-1971 DOA: 10/27/2016 PCP: Ambrose Finland, NP  HPI/Recap of past 24 hours:  Feeling better, dizziness has resolved,  but persistent numbness right sided face, report trouble swallowing, left lower extremity tingling sensation family   Assessment/Plan: Principal Problem:   Acute ischemic stroke (HCC) Active Problems:   HYPERTENSION, BENIGN ESSENTIAL   GASTROESOPHAGEAL REFLUX DISEASE   Bipolar depression (HCC)   History of CVA (cerebrovascular accident)   Hypokalemia   Prolonged Q-T interval on ECG   Hyponatremia   CKD (chronic kidney disease), stage III   Right facial numbness   Elevated troponin   Acute URI   Acute ischemic right lateral medullary infarct: No TPa given due to Patient presented outside of TPA window She presented with right side headache, right facial numbness, left lower extremity tingling, dizziness, unsteady gain, trouble swallowing H/o right eye blindness from prior CVA in 2013, she has been on asa 81mg  daily prior to coming the the hospital Lipid panel, a1c pending, echo/carotid doppler pending, tele no arrythemia PT/OT/speech, allow permissive hypertension, gradual resume home bp meds, likely will need to start plavix, will follow up on neurology recommendation.  Hypokalemia: replace k, mag 2.3, hold HCTZ  Leukocytosis: no fever, patient does not look septic, ua unremarkable, cxr pending  QTc prolongation:  QTc 523 on presentation, improved, today 461 after correcting hypokalemia  HTN: home bp meds norvasc/lopressor/lisinopril/hctz held since admission , allow permissive hypertension, resume bp meds gradually  Impaired fasting blood glucose, am glucose 144, a1c pending, patient denies personal h/o diabetes, dose report family h/o diabetes  CKD III, stable at baseline  Bipolar disorder:  managed at [facility] and she has been compliant with medications. Continue home meds  celexa/hydroxyzine/lurasidone stable  Body mass index is 32.28 kg/m.  Cigarette smoking, cessation eduction provided, nicotine patch ordered  +Marijuana  In UDS.  Code Status: full  Family Communication: patient and family in room  Disposition Plan: home in 1-2 days with neurology clearance   Consultants:  I have placed call to neurology  Procedures:  none  Antibiotics:  none   Objective: BP (!) 142/77 (BP Location: Left Arm)   Pulse 80   Temp 98 F (36.7 C) (Oral)   Resp 18   Ht 5\' 6"  (1.676 m)   Wt 90.7 kg (200 lb)   LMP 10/22/2016 (Exact Date)   SpO2 98%   BMI 32.28 kg/m   Intake/Output Summary (Last 24 hours) at 10/28/16 0830 Last data filed at 10/28/16 0142  Gross per 24 hour  Intake              290 ml  Output                0 ml  Net              290 ml   Filed Weights   10/27/16 2204  Weight: 90.7 kg (200 lb)    Exam:   General:  NAD  Cardiovascular: RRR  Respiratory: CTABL  Abdomen: Soft/ND/NT, positive BS  Musculoskeletal: No Edema  Neuro: right eye blind (chronic), strength, sensation to touch intact, no dysarthria, report trouble swallowing, numbness right side of the face, tingling left lower extremity  Data Reviewed: Basic Metabolic Panel:  Recent Labs Lab 10/27/16 2110 10/28/16 0449  NA 133* 137  K 2.9* 3.5  CL 100* 103  CO2 23 25  GLUCOSE 137* 144*  BUN 10 13  CREATININE 1.40* 1.24*  CALCIUM 8.4*  8.2*   Liver Function Tests: No results for input(s): AST, ALT, ALKPHOS, BILITOT, PROT, ALBUMIN in the last 168 hours. No results for input(s): LIPASE, AMYLASE in the last 168 hours. No results for input(s): AMMONIA in the last 168 hours. CBC:  Recent Labs Lab 10/27/16 2110  WBC 12.0*  NEUTROABS 7.2  HGB 11.7*  HCT 36.2  MCV 80.1  PLT 407*   Cardiac Enzymes:    Recent Labs Lab 10/28/16 0449  TROPONINI <0.03   BNP (last 3 results) No results for input(s): BNP in the last 8760 hours.  ProBNP (last 3  results) No results for input(s): PROBNP in the last 8760 hours.  CBG:  Recent Labs Lab 10/28/16 0128 10/28/16 0631  GLUCAP 120* 115*    No results found for this or any previous visit (from the past 240 hour(s)).   Studies: Ct Head Wo Contrast  Result Date: 10/27/2016 CLINICAL DATA:  Acute onset of right-sided facial numbness. Pain behind the left ear. Initial encounter. EXAM: CT HEAD WITHOUT CONTRAST TECHNIQUE: Contiguous axial images were obtained from the base of the skull through the vertex without intravenous contrast. COMPARISON:  CT of the head and MRI of the brain performed 06/07/2010 FINDINGS: Brain: No evidence of acute infarction, hemorrhage, hydrocephalus, extra-axial collection or mass lesion/mass effect. The posterior fossa, including the cerebellum, brainstem and fourth ventricle, is within normal limits. The third and lateral ventricles, and basal ganglia are unremarkable in appearance. The cerebral hemispheres are symmetric in appearance, with normal gray-white differentiation. No mass effect or midline shift is seen. Vascular: No hyperdense vessel or unexpected calcification. Skull: There is no evidence of fracture; visualized osseous structures are unremarkable in appearance. Sinuses/Orbits: The orbits are within normal limits. The paranasal sinuses and mastoid air cells are well-aerated. Other: No significant soft tissue abnormalities are seen. IMPRESSION: Unremarkable noncontrast CT of the head. Electronically Signed   By: Roanna RaiderJeffery  Chang M.D.   On: 10/27/2016 23:05   Mr Brain Wo Contrast  Result Date: 10/28/2016 CLINICAL DATA:  Initial evaluation for acute headache, right facial numbness. EXAM: MRI HEAD WITHOUT CONTRAST TECHNIQUE: Multiplanar, multiecho pulse sequences of the brain and surrounding structures were obtained without intravenous contrast. COMPARISON:  Prior CT from earlier the same day. FINDINGS: Brain: Study mildly degraded by motion artifact. Cerebral volume  within normal limits for patient age. Minimal patchy T2/FLAIR hyperintensity within the periventricular and deep white matter both cerebral hemispheres, nonspecific, but most like related to minimal chronic small vessel ischemic disease, within normal limits for age. There is a subtle 6 mm focus of restricted diffusion within the right lateral medulla, compatible with a small acute ischemic infarct (series 3, image 10 on axial DWI sequence, series 4, image 21 on coronal DWI sequence. No associated hemorrhage or mass effect. No other evidence for acute or subacute ischemia. Gray-white matter differentiation otherwise maintained. No evidence for acute or chronic intracranial hemorrhage. No areas of encephalomalacia to suggest prior infarction. No mass lesion, midline shift, or mass effect. Ventricles normal in size without evidence hydrocephalus. No extra-axial fluid collection. Major dural sinuses are grossly patent. Incidental note made of a partially empty sella. Suprasellar region normal. Midline structures intact. Vascular: Major intracranial vascular flow voids are maintained. Skull and upper cervical spine: Craniocervical junction normal. Visualized upper cervical spine within normal limits. Focal well-circumscribed lesion within the central aspect of the clivus is stable relative to prior MRI from 2011, consistent with a benign lesion. Bone marrow signal intensity otherwise within normal limits. No  scalp soft tissue abnormality. Sinuses/Orbits: Globes and orbital soft tissues within normal limits. Mild scattered mucosal thickening within the maxillary sinuses and ethmoidal air cells. Paranasal sinuses are otherwise clear. No mastoid effusion. Inner ear structures grossly normal. IMPRESSION: 1. 6 mm acute ischemic right lateral medullary infarct. No associated hemorrhage or mass effect. 2. Otherwise normal MRI of the brain for patient age. Electronically Signed   By: Rise MuBenjamin  McClintock M.D.   On: 10/28/2016  00:39    Scheduled Meds: . aspirin  300 mg Rectal Daily   Or  . aspirin  325 mg Oral Daily  . [START ON 10/29/2016] citalopram  20 mg Oral Daily  . enoxaparin (LOVENOX) injection  40 mg Subcutaneous Q24H  . insulin aspart  0-5 Units Subcutaneous QHS  . insulin aspart  0-9 Units Subcutaneous TID WC  . lurasidone  20 mg Oral QHS    Continuous Infusions: . 0.9 % NaCl with KCl 40 mEq / L 75 mL/hr (10/28/16 0138)     Time spent: 35mins from 12noon to 12:35pm  Ford Peddie MD, PhD  [facility] Pager [phone]. If 7PM-7AM, please contact night-coverage at [email or web address], password [email or web address] 10/28/2016, 8:30 AM  LOS: 0 days

## 2016-10-28 NOTE — Progress Notes (Signed)
Patient arrived from ED to 5M05 at this time. Safety precautions and orders reviewed with patient/family. TELE applied and confirmed. VSS. No other distress noted. Will continue to monitor.   Sim BoastHavy, RN

## 2016-10-28 NOTE — ED Notes (Signed)
Pt returned from MRI and connected to the monitor 

## 2016-10-29 LAB — CBC
HCT: 35.5 % — ABNORMAL LOW (ref 36.0–46.0)
Hemoglobin: 11.6 g/dL — ABNORMAL LOW (ref 12.0–15.0)
MCH: 26 pg (ref 26.0–34.0)
MCHC: 32.7 g/dL (ref 30.0–36.0)
MCV: 79.6 fL (ref 78.0–100.0)
PLATELETS: 356 10*3/uL (ref 150–400)
RBC: 4.46 MIL/uL (ref 3.87–5.11)
RDW: 16.6 % — AB (ref 11.5–15.5)
WBC: 8.1 10*3/uL (ref 4.0–10.5)

## 2016-10-29 LAB — COMPREHENSIVE METABOLIC PANEL
ALT: 30 U/L (ref 14–54)
AST: 35 U/L (ref 15–41)
Albumin: 3.2 g/dL — ABNORMAL LOW (ref 3.5–5.0)
Alkaline Phosphatase: 65 U/L (ref 38–126)
Anion gap: 8 (ref 5–15)
BUN: 8 mg/dL (ref 6–20)
CHLORIDE: 103 mmol/L (ref 101–111)
CO2: 26 mmol/L (ref 22–32)
CREATININE: 1.12 mg/dL — AB (ref 0.44–1.00)
Calcium: 8.7 mg/dL — ABNORMAL LOW (ref 8.9–10.3)
GFR, EST NON AFRICAN AMERICAN: 58 mL/min — AB (ref >60)
Glucose, Bld: 105 mg/dL — ABNORMAL HIGH (ref 65–99)
Potassium: 3.6 mmol/L (ref 3.5–5.1)
Sodium: 137 mmol/L (ref 135–145)
Total Bilirubin: 0.2 mg/dL — ABNORMAL LOW (ref 0.3–1.2)
Total Protein: 6.3 g/dL — ABNORMAL LOW (ref 6.5–8.1)

## 2016-10-29 LAB — VAS US CAROTID
LCCADDIAS: -24 cm/s
LCCADSYS: -73 cm/s
LEFT ECA DIAS: -13 cm/s
LEFT VERTEBRAL DIAS: 19 cm/s
LICAPDIAS: -21 cm/s
Left CCA prox dias: 21 cm/s
Left CCA prox sys: 114 cm/s
Left ICA dist dias: -31 cm/s
Left ICA dist sys: -99 cm/s
Left ICA prox sys: -75 cm/s
RCCAPDIAS: -14 cm/s
RCCAPSYS: -97 cm/s
RIGHT ECA DIAS: -25 cm/s
RIGHT VERTEBRAL DIAS: -12 cm/s
Right cca dist sys: -75 cm/s

## 2016-10-29 LAB — GLUCOSE, CAPILLARY
GLUCOSE-CAPILLARY: 103 mg/dL — AB (ref 65–99)
Glucose-Capillary: 103 mg/dL — ABNORMAL HIGH (ref 65–99)

## 2016-10-29 LAB — HEMOGLOBIN A1C
Hgb A1c MFr Bld: 5.6 % (ref 4.8–5.6)
Mean Plasma Glucose: 114 mg/dL

## 2016-10-29 MED ORDER — ALUM & MAG HYDROXIDE-SIMETH 200-200-20 MG/5ML PO SUSP
30.0000 mL | Freq: Four times a day (QID) | ORAL | Status: DC | PRN
  Administered 2016-10-29: 30 mL via ORAL
  Filled 2016-10-29: qty 30

## 2016-10-29 MED ORDER — FLUTICASONE PROPIONATE 50 MCG/ACT NA SUSP: 1.0000 | Freq: Every day | NASAL | 0 refills | Status: DC

## 2016-10-29 MED ORDER — CLOPIDOGREL BISULFATE 75 MG PO TABS: 75.0000 mg | ORAL_TABLET | Freq: Every day | ORAL | 0 refills | Status: DC

## 2016-10-29 MED ORDER — ATORVASTATIN CALCIUM 20 MG PO TABS: 20.0000 mg | ORAL_TABLET | Freq: Every day | ORAL | 0 refills | Status: DC

## 2016-10-29 MED ORDER — POTASSIUM CHLORIDE CRYS ER 20 MEQ PO TBCR
40.0000 meq | EXTENDED_RELEASE_TABLET | Freq: Once | ORAL | Status: AC
  Administered 2016-10-29: 40 meq via ORAL
  Filled 2016-10-29: qty 2

## 2016-10-29 MED FILL — CLOPIDOGREL 75 MG TABLET: 75 | 30 days supply | Qty: 30 | Fill #0

## 2016-10-29 MED FILL — ATORVASTATIN 20 MG TABLET: 20 | 30 days supply | Qty: 30 | Fill #0

## 2016-10-29 MED FILL — FLUTICASONE PROP 50 MCG SPR: 50 | 30 days supply | Qty: 16 | Fill #0

## 2016-10-29 NOTE — Progress Notes (Signed)
Discharge instructions reviewed with patient. All questions answered at this time. Transport home per family.  Abigayle Wilinski, RN 

## 2016-10-29 NOTE — Care Management Note (Signed)
Case Management Note  Patient Details  Name: Nils FlackLiz Grieshaber MRN: [id number] Date of Birth: Dec 31, 1970  Subjective/Objective:      Pt admitted with CVA. She is from home. No insurance per pt.               Action/Plan: No f/u per PT/OT and no DME needs. Pt is already a patient with [facility] and plans on following up with them. [facility]   Expected Discharge Date:  10/29/16               Expected Discharge Plan:  Home/Self Care  In-House Referral:     Discharge planning Services  CM Consult  Post Acute Care Choice:    Choice offered to:     DME Arranged:    DME Agency:     HH Arranged:    HH Agency:     Status of Service:  Completed, signed off  If discussed at [employer] Length [employer] Stay Meetings, dates discussed:    Additional Comments:  Kermit BaloKelli F Maudie Shingledecker, RN 10/29/2016, 2:26 PM

## 2016-10-29 NOTE — Progress Notes (Signed)
STROKE TEAM PROGRESS NOTE   HISTORY OF PRESENT ILLNESS (per record) This is a 45-yo RH woman who presented to the ED for the evaluation of R face numbness. History is obtained directly from the patient who is a good historian.   She states that she woke up on the morning of 10/27/16 with numbness of the R face (LKW 10/26/2016 before going to bed). This was associated with some pain around the R ear that radiated across the R cheek. She thought that she had an ear infection because she has been dealing with some sinus congestion, sore throat and fullness in the R ear for the preceding couple of days. She states that her congestion and pain resolved but the numbness persisted and she decided to come to the ED to be evaluated. She denies any other numbness but says that her right side feels "off" and she has had some mild difficulty with her balance and trouble swallowing. 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 MRI was obtained in the ED and showed an acute R lateral medullary infarct. She was admitted for stroke evaluation and neurology consult is requested. NHISS score: 1. mRS score: 0. Patient was not administered IV t-PA secondary to outside of window. She was admitted for further evaluation and treatment.   SUBJECTIVE (INTERVAL HISTORY) No family at bedside. Still has right facial numbness, but no weakness. She stated that she was here in 2012 for right eye vision loss. As per record, she was diagnosed with right retina artery occlusion, however all stroke work up were negative including CUS, MRI, hypercoagulable work up.    OBJECTIVE Temp:  [97.9 F (36.6 C)-98.6 F (37 C)] 98.6 F (37 C) (12/04 0926) Pulse Rate:  [75-88] 88 (12/04 0926) Cardiac Rhythm: Heart block (12/04 0700) Resp:  [18] 18 (12/04 0926) BP: (160-169)/(88-93) 169/93 (12/04 0926) SpO2:  [99 %-100 %] 100 % (12/04 0926)  CBC:  Recent Labs Lab  10/27/16 2110 10/29/16 0552  WBC 12.0* 8.1  NEUTROABS 7.2  --   HGB 11.7* 11.6*  HCT 36.2 35.5*  MCV 80.1 79.6  PLT 407* 356    Basic Metabolic Panel:  Recent Labs Lab 10/28/16 0449 10/28/16 1043 10/29/16 0552  NA 137  --  137  K 3.5  --  3.6  CL 103  --  103  CO2 25  --  26  GLUCOSE 144*  --  105*  BUN 13  --  8  CREATININE 1.24*  --  1.12*  CALCIUM 8.2*  --  8.7*  MG  --  2.3  --     Lipid Panel:    Component Value Date/Time   CHOL 164 10/28/2016 0449   TRIG 281 (H) 10/28/2016 0449   HDL 32 (L) 10/28/2016 0449   CHOLHDL 5.1 10/28/2016 0449   VLDL 56 (H) 10/28/2016 0449   LDLCALC 76 10/28/2016 0449   HgbA1c:  Lab Results  Component Value Date   HGBA1C 5.6 10/28/2016   Urine Drug Screen:    Component Value Date/Time   LABOPIA NONE DETECTED 10/27/2016 2044   COCAINSCRNUR NONE DETECTED 10/27/2016 2044   LABBENZ NONE DETECTED 10/27/2016 2044   AMPHETMU NONE DETECTED 10/27/2016 2044   THCU POSITIVE (A) 10/27/2016 2044   LABBARB NONE DETECTED 10/27/2016 2044      IMAGING I have personally reviewed the radiological images below and agree with the radiology interpretations.  Ct Head Wo Contrast 10/27/2016  Unremarkable noncontrast CT of the head.   Mr Brain Wo Contrast 10/28/2016 1. 6 mm acute ischemic right lateral medullary infarct. No associated hemorrhage or mass effect. 2. Otherwise normal MRI of the brain for patient age.   Mr [facility] Contrast Mr Maxine Glenn Neck W Wo Contrast 10/28/2016 1. No acute finding, including evidence of dissection. No noted flow limiting stenosis. 2. Mild atheromatous changes in the posterior circulation. 3. 2 mm left supraclinoid ICA aneurysm.   Dg Chest Port 1 View 10/28/2016 No active disease.   Carotid Doppler   There is 1-39% bilateral ICA stenosis. Vertebral artery flow is antegrade.   2D Echocardiogram  - Left ventricle: The cavity size was normal. There was moderate concentric hypertrophy. Systolic function was  normal. The estimated ejection fraction was in the range of 55% to 60%. Wall motion was normal; there were no regional wall motion abnormalities. Early diastolic septal annular tissue Doppler velocities Ea were mildly abnormal for age. Indeterminate ventricular filling pressure by Doppler parameters. No evidence of thrombus. - Aortic valve: There was trivial regurgitation. - Left atrium: The atrium was moderately dilated.   PHYSICAL EXAM  Temp:  [98.4 F (36.9 C)-98.8 F (37.1 C)] 98.8 F (37.1 C) (12/04 1327) Pulse Rate:  [81-88] 81 (12/04 1327) Resp:  [18] 18 (12/04 1327) BP: (153-169)/(92-93) 153/92 (12/04 1327) SpO2:  [99 %-100 %] 100 % (12/04 1327)  General - Well nourished, well developed, in no apparent distress.  Ophthalmologic - Fundi not visualized due to noncooperation.  Cardiovascular - Regular rate and rhythm.  Mental Status -  Level of arousal and orientation to time, place, and person were intact. Language including expression, naming, repetition, comprehension was assessed and found intact. Fund of Knowledge was assessed and was intact.  Cranial Nerves II - XII - II - right eye legally blind, with only HW. Left eye visual field full III, IV, VI - Extraocular movements intact. V - Facial sensation decreased on the right, with only 10-15% of the left. VII - Facial movement intact bilaterally. VIII - Hearing & vestibular intact bilaterally. X - Palate elevates symmetrically. XI - Chin turning & shoulder shrug intact bilaterally. XII - Tongue protrusion intact.  Motor Strength - The patient's strength was normal in all extremities and pronator drift was absent.  Bulk was normal and fasciculations were absent.   Motor Tone - Muscle tone was assessed at the neck and appendages and was normal.  Reflexes - The patient's reflexes were 1+ in all extremities and she had no pathological reflexes.  Sensory - Light touch, temperature/pinprick were assessed and were  symmetrical.    Coordination - The patient had normal movements in the hands and feet with no ataxia or dysmetria.  Tremor was absent.  Gait and Station - deferred.   ASSESSMENT/PLAN [name] is a 45 y.o. 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 She did not receive IV t-PA due to delay in arrival.   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 2mm L supraclinoid ICA aneurysm  Carotid doppler No significant stenosis   2D Echo  EF 55-60%. No source of embolus   LDL 76  HgbA1c 5.6  Lovenox 40 mg sq daily for VTE prophylaxis  Diet regular Room  service appropriate? Yes; Fluid consistency: Thin  aspirin 81 mg daily prior to admission, now on clopidogrel 75 mg daily. Continued at discharge.  Patient counseled to be compliant with her antithrombotic medications  Ongoing aggressive stroke risk factor management  Therapy recommendations:  No PT  Disposition:  Return home  Hx of right retinal artery occlusion  In 2012  Stroke work up at that time negative including MRI, CUS, hypercoagulable work up  Right eye can see HW, legally blind, no change over time  Hypertension  Stable Permissive hypertension (OK if < 220/120) but gradually normalize in 5-7 days Long-term BP goal normotensive  Hyperlipidemia  Home meds:  no statin  LDL 76, goal < 70  Now on lipitor 20  Continue statin at discharge  Tobacco abuse  Current smoker  Smoking cessation counseling provided  Pt is willing to quit  Other Stroke Risk Factors  Marijuana use, UDS positive this admission  Obesity, Body mass index is 32.28 kg/m., recommend weight loss, diet and exercise as appropriate   Other Active Problems  Troponin elevated  Hypokalemia,  hyponatremia  Acute URI  CKD stage III  Bipolar disorder  Prolonged QTC  Hospital day # 1  Neurology will sign off. Please call with questions. Pt [facility] 6 weeks. Thanks for the consult.  Marvel PlanJindong Zylen Wenig, MD PhD Stroke Neurology 10/30/2016 2:31 AM   To contact Stroke Continuity provider, please refer to [email or web address]. After hours, contact General Neurology

## 2016-10-29 NOTE — Discharge Summary (Signed)
Discharge Summary  Kendra Vega GBT:[id number] DOB: [birth year]  PCP: Ambrose Finland, NP  Admit date: 10/27/2016 Discharge date: 10/29/2016  Time spent: <45mins  Recommendations for Outpatient Follow-up:  1. F/u with PMD within a week  for hospital discharge follow up, repeat cbc/bmp at follow up 2. F/u with neurology for CVA  Discharge Diagnoses:  Active Hospital Problems   Diagnosis Date Noted  . Acute ischemic stroke (HCC) 10/28/2016  . History of CVA (cerebrovascular accident) 10/28/2016  . Hypokalemia 10/28/2016  . Prolonged Q-T interval on ECG 10/28/2016  . Hyponatremia 10/28/2016  . CKD (chronic kidney disease), stage III 10/28/2016  . Right facial numbness 10/28/2016  . Elevated troponin 10/28/2016  . Acute URI 10/28/2016  . Bipolar depression (HCC) 08/10/2010  . GASTROESOPHAGEAL REFLUX DISEASE 08/10/2010  . HYPERTENSION, BENIGN ESSENTIAL 06/07/2010    Resolved Hospital Problems   Diagnosis Date Noted Date Resolved  No resolved problems to display.    Discharge Condition: stable  Diet recommendation: heart healthy  Filed Weights   10/27/16 2204  Weight: 90.7 kg (200 lb)    History of present illness:  Patient coming from: Home, by way of urgent care   Chief Complaint: Right facial numbness  HPI: Kendra Vega is a 45 y.o. 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 Patient reports that she had been in her usual state of health until she noticed the insidious development of sinus congestion, sore throat, and right ear fullness beginning 2 days ago. There was no associated fevers, chills, or cough with this. She also developed a right-sided headache the day prior to her presentation and then woke on the morning  of 10/27/2016 at approximately 8 AM with numbness involving the right scalp and right face. Numbness persisted throughout the day as the sinus congestion and headache improved and nearly resolved. Patient denies change in vision or hearing and denies extremity numbness or weakness. There has been no confusion or speech difficulty. Patient does endorse some mild difficulty with coordination and has felt as though she is falling to the right side. There has been no fall. Patient sought evaluation for this in an urgent care this evening and, with concern for possible CVA, was directed to the ED for evaluation.  ED Course: Upon arrival to the ED, patient is found to be afebrile, saturating well on room air, and with vital signs stable. EKG demonstrates a sinus rhythm with flattening of the T waves in anterolateral leads and a QTc prolonged to 523 ms. Noncontrast head CT is unremarkable, chemistry panel features a sodium of 133, potassium of 2.9, and creatinine 1.40, up from 1.25 earlier this year. CBC features a leukocytosis to 12,000 with thrombocytosis of 407,000 and a mild normocytic anemia with hemoglobin of 11.7. UDS is positive for THC only, ethanol level was undetectable, and urinalysis is unremarkable. Troponin is elevated to a value of 0.11 and the patient continues to deny chest pain or dyspnea. 1 L of normal saline was administered in the ED and the patient was treated with 40 mEq oral potassium. MRI brain was obtained and findings are consistent with a 6 mm acute ischemic right lateral medullary infarct without hemorrhage or mass effect. Patient has remained hemodynamically stable and in no respiratory distress and she will be  observed on the telemetry unit for ongoing evaluation and management of acute CVA.   Hospital Course:  Principal Problem:   Acute ischemic stroke [facility]) Active Problems:   HYPERTENSION, BENIGN ESSENTIAL   GASTROESOPHAGEAL REFLUX DISEASE   Bipolar depression (HCC)   History  of CVA (cerebrovascular accident)   Hypokalemia   Prolonged Q-T interval on ECG   Hyponatremia   CKD (chronic kidney disease), stage III   Right facial numbness   Elevated troponin   Acute URI   Acute ischemic right lateral medullary infarct: No TPA given due to Patient presented outside of TPA window She presented with right side headache, right facial numbness, left lower extremity tingling, dizziness, unsteady gain, trouble swallowing H/o right eye blindness from prior CVA in 2013, she has been on asa 81mg  daily prior to coming the the hospital  Lipid panel ldl 76, triglycerides 281, hdl 32, a1c 5.6%, echoLVEF wnl, no cardiac embolic source.  MRI brain: "1. 6 mm acute ischemic right lateral medullary infarct. No associated hemorrhage or mass effect. 2. Otherwise normal MRI of the brain for patient age."  MRA head/neck " IMPRESSION: 1. No acute finding, including evidence of dissection. No noted flow limiting stenosis. 2. Mild atheromatous changes in the posterior circulation.  3. 2 mm left supraclinoid ICA aneurysm."  tele no arrythemia  Home bp meds held in the hospital for permissive hypertension, resume at discharge Neurology consulted, she is started on plavix/statin, outpatient follow up with neurology recommended.  She is cleared by PT/OT/speech.  Hypokalemia: k replaced, mag 2.3,  HCTZ held in the hospital  Leukocytosis: no fever, patient does not look septic, ua unremarkable, cxr no acute findings Wbc normalized   QTc prolongation:  QTc 523 on presentation, improved, today 461 after correcting hypokalemia  HTN: home bp meds norvasc/lopressor/lisinopril/hctz held since admission , allow permissive hypertension, resume bp meds gradually  Impaired fasting blood glucose, am glucose 144, a1c 5.6,  patient denies personal h/o diabetes, dose report family h/o diabetes  CKD III, stable at baseline  Bipolar disorder:  managed at [facility] and she has been compliant  with medications. Continue home meds celexa/hydroxyzine/lurasidone stable  Body mass index is 32.28 kg/m.  Cigarette smoking, cessation eduction provided, nicotine patch ordered  +Marijuana  In UDS.  Code Status: full  Family Communication: patient   Disposition Plan: home on 12/4  with neurology clearance   Consultants:  neurology  Procedures:  none  Antibiotics:  none   Discharge Exam: BP (!) 153/92 (BP Location: Left Arm)   Pulse 81   Temp 98.8 F (37.1 C) (Oral)   Resp 18   Ht 5\' 6"  (1.676 m)   Wt 90.7 kg (200 lb)   LMP 10/22/2016 (Exact Date)   SpO2 100%   BMI 32.28 kg/m     General:  NAD  Cardiovascular: RRR  Respiratory: CTABL  Abdomen: Soft/ND/NT, positive BS  Musculoskeletal: No Edema  Neuro: right eye blind (chronic), strength, sensation to touch intact, no dysarthria, swallowing has improved, no chocking, numbness right side of the face, tingling left lower extremity has improved. Gait has improved.    Discharge Instructions You were cared for by a hospitalist during your hospital stay. If you have any questions about your discharge medications or the care you received while you were in the hospital after you are discharged, you can call the unit and asked to speak with the hospitalist on call if the hospitalist that took care of you is not available. Once you are  discharged, your primary care physician will handle any further medical issues. Please note that NO REFILLS for any discharge medications will be authorized once you are discharged, as it is imperative that you return to your primary care physician (or establish a relationship with a primary care physician if you do not have one) for your aftercare needs so that they can reassess your need for medications and monitor your lab values.  Discharge Instructions    Ambulatory referral to Neurology    Complete by:  As directed    An appointment is requested in approximately: 4  weeks Stroke clinic   Diet - low sodium heart healthy    Complete by:  As directed    Increase activity slowly    Complete by:  As directed        Medication List    STOP taking these medications   aspirin 81 MG EC tablet Commonly known as:  aspirin EC     TAKE these medications   acetaminophen 500 MG tablet Commonly known as:  TYLENOL Take 1,000 mg by mouth every 6 (six) hours as needed.   amLODipine 5 MG tablet Commonly known as:  NORVASC Take 1 tablet (5 mg total) by mouth daily.   atorvastatin 20 MG tablet Commonly known as:  LIPITOR Take 1 tablet (20 mg total) by mouth daily at 6 PM.   cetirizine 10 MG chewable tablet Commonly known as:  ZYRTEC Chew 10 mg by mouth daily as needed for allergies.   citalopram 20 MG tablet Commonly known as:  CELEXA Take 20 mg by mouth daily.   clopidogrel 75 MG tablet Commonly known as:  PLAVIX Take 1 tablet (75 mg total) by mouth daily. Start taking on:  10/30/2016   CORICIDIN 2-325 MG Tabs Generic drug:  Chlorpheniramine-APAP Take 1 tablet by mouth daily as needed (cold).   fluticasone 50 MCG/ACT nasal spray Commonly known as:  FLONASE Place 1 spray into both nostrils daily.   hydrOXYzine 25 MG tablet Commonly known as:  ATARAX/VISTARIL Take 25 mg by mouth 3 (three) times daily.   lisinopril-hydrochlorothiazide 20-12.5 MG tablet Commonly known as:  PRINZIDE,ZESTORETIC Take 1 tablet by mouth daily.   lurasidone 20 MG Tabs tablet Commonly known as:  LATUDA Take 20 mg by mouth at bedtime.   metoprolol 50 MG tablet Commonly known as:  LOPRESSOR Take 1 tablet (50 mg total) by mouth 2 (two) times daily.   polyethylene glycol powder powder Commonly known as:  GLYCOLAX/MIRALAX Take 17 g by mouth daily. What changed:  when to take this  reasons to take this      No Known Allergies Follow-up Information    Ambrose Finland, NP Follow up in 1 week(s).   Specialty:  Internal Medicine Why:  hospital discharge  follow up       [facility] Follow up in 1 month(s).   Specialty:  Neurology Why:  hospital discharge follow up for stroke Contact information: [address] [place] [address] [phone]           The results of significant diagnostics from this hospitalization (including imaging, microbiology, ancillary and laboratory) are listed below for reference.    Significant Diagnostic Studies: Ct Head Wo Contrast  Result Date: 10/27/2016 CLINICAL DATA:  Acute onset of right-sided facial numbness. Pain behind the left ear. Initial encounter. EXAM: CT HEAD WITHOUT CONTRAST TECHNIQUE: Contiguous axial images were obtained from the base of the skull through the vertex without intravenous  contrast. COMPARISON:  CT of the head and MRI of the brain performed 06/07/2010 FINDINGS: Brain: No evidence of acute infarction, hemorrhage, hydrocephalus, extra-axial collection or mass lesion/mass effect. The posterior fossa, including the cerebellum, brainstem and fourth ventricle, is within normal limits. The third and lateral ventricles, and basal ganglia are unremarkable in appearance. The cerebral hemispheres are symmetric in appearance, with normal gray-white differentiation. No mass effect or midline shift is seen. Vascular: No hyperdense vessel or unexpected calcification. Skull: There is no evidence of fracture; visualized osseous structures are unremarkable in appearance. Sinuses/Orbits: The orbits are within normal limits. The paranasal sinuses and mastoid air cells are well-aerated. Other: No significant soft tissue abnormalities are seen. IMPRESSION: Unremarkable noncontrast CT of the head. Electronically Signed   By: Roanna RaiderJeffery  Chang M.D.   On: 10/27/2016 23:05   Mr Maxine GlennMra Head Wo Contrast  Result Date: 10/28/2016 CLINICAL DATA:  Right lateral medulla acute infarct. EXAM: MRA NECK WITHOUT AND WITH CONTRAST MRA HEAD WITHOUT CONTRAST TECHNIQUE: Multiplanar and  multiecho pulse sequences of the neck were obtained without and with intravenous contrast. Angiographic images of the neck were obtained using MRA technique without and with intravenous contast.; Angiographic images of the Circle of Willis were obtained using MRA technique without intravenous contrast. CONTRAST:  18mL MULTIHANCE GADOBENATE DIMEGLUMINE 529 MG/ML IV SOLN COMPARISON:  None. FINDINGS: MRA NECK FINDINGS Unremarkable arch. Two vessel branching. Both carotids are smooth and widely patent. No evidence of beading. Right dominant vertebral artery is smooth and widely patent to the dura. No evidence of dissection or embolic source in the right vertebral artery to explain the acute infarct. Non dominant left vertebral artery has an apparent narrowing in the proximal V2 segment on postcontrast MRA, but considered artifactual given the normal appearance on time-of-flight, which also covers this area. MRA HEAD FINDINGS Right vertebral artery dominance. Standard vertebrobasilar branching. There is mild undulation of the lumen of both vertebral arteries in the intradural segment, likely atheromatous. This appearance/location is not typical for fibromuscular dysplasia or dissection. No intramural hematoma on conventional MRI. Mild bilateral P2 segment narrowing, also likely atheromatous in this patient with history of hypertension and chronic kidney disease. Symmetric carotid arteries. Hypoplastic right A1 segment. No visible posterior communicating arteries. There is a 2 mm inferiorly directed outpouching from the left supraclinoid ICA which is spherical. IMPRESSION: 1. No acute finding, including evidence of dissection. No noted flow limiting stenosis. 2. Mild atheromatous changes in the posterior circulation. 3. 2 mm left supraclinoid ICA aneurysm. Electronically Signed   By: Marnee SpringJonathon  Watts M.D.   On: 10/28/2016 19:51   Mr Maxine GlennMra Neck W Wo Contrast  Result Date: 10/28/2016 CLINICAL DATA:  Right lateral medulla  acute infarct. EXAM: MRA NECK WITHOUT AND WITH CONTRAST MRA HEAD WITHOUT CONTRAST TECHNIQUE: Multiplanar and multiecho pulse sequences of the neck were obtained without and with intravenous contrast. Angiographic images of the neck were obtained using MRA technique without and with intravenous contast.; Angiographic images of the Circle of Willis were obtained using MRA technique without intravenous contrast. CONTRAST:  18mL MULTIHANCE GADOBENATE DIMEGLUMINE 529 MG/ML IV SOLN COMPARISON:  None. FINDINGS: MRA NECK FINDINGS Unremarkable arch. Two vessel branching. Both carotids are smooth and widely patent. No evidence of beading. Right dominant vertebral artery is smooth and widely patent to the dura. No evidence of dissection or embolic source in the right vertebral artery to explain the acute infarct. Non dominant left vertebral artery has an apparent narrowing in the proximal V2 segment on postcontrast MRA,  but considered artifactual given the normal appearance on time-of-flight, which also covers this area. MRA HEAD FINDINGS Right vertebral artery dominance. Standard vertebrobasilar branching. There is mild undulation of the lumen of both vertebral arteries in the intradural segment, likely atheromatous. This appearance/location is not typical for fibromuscular dysplasia or dissection. No intramural hematoma on conventional MRI. Mild bilateral P2 segment narrowing, also likely atheromatous in this patient with history of hypertension and chronic kidney disease. Symmetric carotid arteries. Hypoplastic right A1 segment. No visible posterior communicating arteries. There is a 2 mm inferiorly directed outpouching from the left supraclinoid ICA which is spherical. IMPRESSION: 1. No acute finding, including evidence of dissection. No noted flow limiting stenosis. 2. Mild atheromatous changes in the posterior circulation. 3. 2 mm left supraclinoid ICA aneurysm. Electronically Signed   By: Marnee SpringJonathon  Watts M.D.   On:  10/28/2016 19:51   Mr Brain Wo Contrast  Result Date: 10/28/2016 CLINICAL DATA:  Initial evaluation for acute headache, right facial numbness. EXAM: MRI HEAD WITHOUT CONTRAST TECHNIQUE: Multiplanar, multiecho pulse sequences of the brain and surrounding structures were obtained without intravenous contrast. COMPARISON:  Prior CT from earlier the same day. FINDINGS: Brain: Study mildly degraded by motion artifact. Cerebral volume within normal limits for patient age. Minimal patchy T2/FLAIR hyperintensity within the periventricular and deep white matter both cerebral hemispheres, nonspecific, but most like related to minimal chronic small vessel ischemic disease, within normal limits for age. There is a subtle 6 mm focus of restricted diffusion within the right lateral medulla, compatible with a small acute ischemic infarct (series 3, image 10 on axial DWI sequence, series 4, image 21 on coronal DWI sequence. No associated hemorrhage or mass effect. No other evidence for acute or subacute ischemia. Gray-white matter differentiation otherwise maintained. No evidence for acute or chronic intracranial hemorrhage. No areas of encephalomalacia to suggest prior infarction. No mass lesion, midline shift, or mass effect. Ventricles normal in size without evidence hydrocephalus. No extra-axial fluid collection. Major dural sinuses are grossly patent. Incidental note made of a partially empty sella. Suprasellar region normal. Midline structures intact. Vascular: Major intracranial vascular flow voids are maintained. Skull and upper cervical spine: Craniocervical junction normal. Visualized upper cervical spine within normal limits. Focal well-circumscribed lesion within the central aspect of the clivus is stable relative to prior MRI from 2011, consistent with a benign lesion. Bone marrow signal intensity otherwise within normal limits. No scalp soft tissue abnormality. Sinuses/Orbits: Globes and orbital soft tissues  within normal limits. Mild scattered mucosal thickening within the maxillary sinuses and ethmoidal air cells. Paranasal sinuses are otherwise clear. No mastoid effusion. Inner ear structures grossly normal. IMPRESSION: 1. 6 mm acute ischemic right lateral medullary infarct. No associated hemorrhage or mass effect. 2. Otherwise normal MRI of the brain for patient age. Electronically Signed   By: Rise MuBenjamin  McClintock M.D.   On: 10/28/2016 00:39   Dg Chest Port 1 View  Result Date: 10/28/2016 CLINICAL DATA:  Leukocytosis EXAM: PORTABLE CHEST 1 VIEW COMPARISON:  None. FINDINGS: The heart size and mediastinal contours are within normal limits. Both lungs are clear. The visualized skeletal structures are unremarkable. IMPRESSION: No active disease. Electronically Signed   By: Gerome Samavid  Williams III M.D   On: 10/28/2016 14:32    Microbiology: No results found for this or any previous visit (from the past 240 hour(s)).   Labs: Basic Metabolic Panel:  Recent Labs Lab 10/27/16 2110 10/28/16 0449 10/28/16 1043 10/29/16 0552  NA 133* 137  --  137  K 2.9* 3.5  --  3.6  CL 100* 103  --  103  CO2 23 25  --  26  GLUCOSE 137* 144*  --  105*  BUN 10 13  --  8  CREATININE 1.40* 1.24*  --  1.12*  CALCIUM 8.4* 8.2*  --  8.7*  MG  --   --  2.3  --    Liver Function Tests:  Recent Labs Lab 10/29/16 0552  AST 35  ALT 30  ALKPHOS 65  BILITOT 0.2*  PROT 6.3*  ALBUMIN 3.2*   No results for input(s): LIPASE, AMYLASE in the last 168 hours. No results for input(s): AMMONIA in the last 168 hours. CBC:  Recent Labs Lab 10/27/16 2110 10/29/16 0552  WBC 12.0* 8.1  NEUTROABS 7.2  --   HGB 11.7* 11.6*  HCT 36.2 35.5*  MCV 80.1 79.6  PLT 407* 356   Cardiac Enzymes:  Recent Labs Lab 10/28/16 0449 10/28/16 1043 10/28/16 1317  TROPONINI <0.03 <0.03 <0.03   BNP: BNP (last 3 results) No results for input(s): BNP in the last 8760 hours.  ProBNP (last 3 results) No results for input(s): PROBNP  in the last 8760 hours.  CBG:  Recent Labs Lab 10/28/16 0128 10/28/16 0631 10/29/16 0642 10/29/16 1105  GLUCAP 120* 115* 103* 103*       Signed:  Andreus Cure MD, PhD  [facility] 10/29/2016, 1:57 PM

## 2016-10-29 NOTE — Evaluation (Signed)
Physical Therapy Evaluation and Discharge Patient Details Name: Kendra FlackLiz Frankenfield MRN: [id number] DOB: [birth year] Today's Date: 10/29/2016   History of Present Illness  Presented to ED with Rt facial numbness. 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 Despite no changes in strength, sensation (including proprioception), or coordination of legs, she initially had dramatic losses of balance with independent recovery (grasping chair, bed, or rail in hall). As she was distracted and given more complex challenges while walking, she had no losses of balance. Patient inquiring about needing a walker or a cane. Do not feel this is indicated as pt appears to have symptom magnification during initial PT eval. In addition, nursing had already deemed pt able to get up independently and discontinued use of chair alarm. Discussed with Havy, RN and she reports she observed pt get OOB to chair this morning with no signs of imbalance.    Follow Up Recommendations No PT follow up    Equipment Recommendations  None recommended by PT    Recommendations for Other Services       Precautions / Restrictions        Mobility  Bed Mobility                  Transfers Overall transfer level: Independent Equipment used: None             General transfer comment: able to stand/sit without use of UEs  Ambulation/Gait Ambulation/Gait assistance: Min assist;Supervision Ambulation Distance (Feet): 400 Feet Assistive device: None Gait Pattern/deviations: Step-through pattern;Wide base of support Gait velocity: able to incr and decr volitionally Gait velocity interpretation: Below normal speed for age/gender General Gait Details: initial staggering LOB to her left or right with up to min assist to recover; gait significantly improved with  incr velocity; no LOB with pivot turns (x 5); one time brushed up against doorframe on her Rt (denies this has been a problem PTA); switched to pt's shoes (flip-flops) instead of hospital slippers and pt with no imbalance with repeated tests  Stairs Stairs: Yes Stairs assistance: Modified independent (Device/Increase time) Stair Management: One rail Right;Alternating pattern;Step to pattern;Forwards Number of Stairs: 10 (5 x2; with bil rails and then one rail) General stair comments: no imbalance or difficulty noted; pt began cautious with step-to and progressed to step-through  Wheelchair Mobility    Modified Rankin (Stroke Patients Only) Modified Rankin (Stroke Patients Only) Pre-Morbid Rankin Score: No significant disability Modified Rankin: Moderate disability     Balance Overall balance assessment: Needs assistance Sitting-balance support: No upper extremity supported;Feet supported Sitting balance-Leahy Scale: Normal Sitting balance - Comments: bending to doff socks and don shoes     Standing balance-Leahy Scale: Good   Single Leg Stance - Right Leg: 2 (steps without LOB) Single Leg Stance - Left Leg: 2 (steps without LOB) Tandem Stance - Right Leg: 0 (LOB with independent recover)   Rhomberg - Eyes Opened: 30 Rhomberg - Eyes Closed: 15     Standardized Balance Assessment Standardized Balance Assessment : Dynamic Gait Index;Berg Balance Test Berg Balance Test Sit to Stand: Able to stand without using hands and stabilize independently Standing Unsupported: Able to stand safely 2 minutes Sitting with Back Unsupported but Feet Supported on Floor or Stool: Able to sit safely and securely 2 minutes Stand to Sit: Sits safely with minimal use of hands Transfers:  Able to transfer safely, minor use of hands Standing Unsupported with Eyes Closed: Able to stand 10 seconds safely Standing Ubsupported with Feet Together: Able to place feet together independently and stand 1  minute safely From Standing, Reach Forward with Outstretched Arm: Can reach confidently >25 cm (10") From Standing Position, Pick up Object from Floor: Able to pick up shoe safely and easily From Standing Position, Turn to Look Behind Over each Shoulder: Looks behind from both sides and weight shifts well Turn 360 Degrees: Able to turn 360 degrees safely in 4 seconds or less Standing Unsupported, Alternately Place Feet on Step/Stool: Able to stand independently and complete 8 steps >20 seconds Standing Unsupported, One Foot in Front: Able to take small step independently and hold 30 seconds Standing on One Leg: Tries to lift leg/unable to hold 3 seconds but remains standing independently Total Score: 50 Dynamic Gait Index Level Surface: Mild Impairment Change in Gait Speed: Mild Impairment Gait with Horizontal Head Turns: Normal Gait with Vertical Head Turns: Normal Gait and Pivot Turn: Normal Step Over Obstacle: Mild Impairment Step Around Obstacles: Normal Steps: Mild Impairment Total Score: 20       Pertinent Vitals/Pain Pain Assessment: No/denies pain    Home Living Family/patient expects to be discharged to:: Private residence Living Arrangements: Children (son) Available Help at Discharge: Family;Available PRN/intermittently Type of Home: Apartment Home Access: Level entry     Home Layout: One level Home Equipment: None      Prior Function Level of Independence: Independent         Comments: states no balance problems prior     Hand Dominance   Dominant Hand: Right    Extremity/Trunk Assessment   Upper Extremity Assessment: Defer to OT evaluation;Overall [facility]FL for tasks assessed           Lower Extremity Assessment:  (bil strength 5/5; sensation intact; proprioception intact)      Cervical / Trunk Assessment: Other exceptions  Communication   Communication: No difficulties  Cognition Arousal/Alertness: Awake/alert Behavior During Therapy: WFL for  tasks assessed/performed Overall Cognitive Status: Within Functional Limits for tasks assessed                      General Comments General comments (skin integrity, edema, etc.): Patient's balance significantly/dramatically impaired initial 15 ft (see gait), however dramatically improved as she was distracted  by conversation and other challenging tasks. She questioned several times if she needed a walker for home. Patient aware that nursing had signed her off as independent (by 3 different people observing her).     Exercises     Assessment/Plan    PT Assessment Patent does not need any further PT services  PT Problem List            PT Treatment Interventions      PT Goals (Current goals can be found in the Care Plan section)  Acute Rehab PT Goals Patient Stated Goal: go home today PT Goal Formulation: All assessment and education complete, DC therapy    Frequency     Barriers to discharge        Co-evaluation               End of Session Equipment Utilized During Treatment: Gait belt Activity Tolerance: Patient tolerated treatment well Patient left: in chair;with call bell/phone within reach;with family/visitor present Nurse Communication: Mobility status;Other (comment) (dramatic change in balance start to finish)         Time:  4098-1191 PT Time Calculation (min) (ACUTE ONLY): 24 min   Charges:   PT Evaluation $PT Eval Low Complexity: 1 Procedure PT Treatments $Gait Training: 8-22 mins   PT G CodesScherrie November Karalee Hauter 11/23/16, 9:30 AM Pager [phone]

## 2016-10-29 NOTE — Evaluation (Signed)
Occupational Therapy Evaluation and Discharge Patient Details Name: Nils FlackLiz Lamorte MRN: [id number] DOB: 06/16/1971 Today's Date: 10/29/2016    History of Present Illness Presented to ED with Rt facial numbness. 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 Noted to use furniture at times when ambulating. Educated pt in s/s CVA with pt verbalizing understanding. No further OT needs.    Follow Up Recommendations  No OT follow up    Equipment Recommendations  None recommended by OT    Recommendations for Other Services       Precautions / Restrictions Restrictions Weight Bearing Restrictions: No      Mobility Bed Mobility Overal bed mobility: Independent                Transfers Overall transfer level: Independent Equipment used: None             General transfer comment: able to stand/sit without use of UEs    Balance Overall balance assessment: Needs assistance Sitting-balance support: No upper extremity supported;Feet supported Sitting balance-Leahy Scale: Normal Sitting balance - Comments: bending to doff socks and don shoes     Standing balance-Leahy Scale: Good              ADL Overall ADL's : Modified independent                                             Vision     Perception     Praxis      Pertinent Vitals/Pain Pain Assessment: 0-10 Faces Pain Scale: Hurts a little bit Pain Location: head Pain Descriptors / Indicators: Aching Pain Intervention(s): Monitored during session     Hand Dominance Right   Extremity/Trunk Assessment Upper Extremity Assessment Upper Extremity Assessment: Overall WFL for tasks assessed   Lower Extremity Assessment Lower Extremity Assessment: Overall WFL for tasks assessed   Cervical / Trunk Assessment Cervical / Trunk  Assessment: Other exceptions Cervical / Trunk Exceptions: obese   Communication Communication Communication: No difficulties   Cognition Arousal/Alertness: Awake/alert Behavior During Therapy: WFL for tasks assessed/performed Overall Cognitive Status: Within Functional Limits for tasks assessed                     General Comments       Exercises       Shoulder Instructions      Home Living Family/patient expects to be discharged to:: Private residence Living Arrangements: Children (son) Available Help at Discharge: Family;Available PRN/intermittently Type of Home: Apartment Home Access: Level entry     Home Layout: One level     Bathroom Shower/Tub: Tub/shower unit;Curtain   FirefighterBathroom Toilet: Standard     Home Equipment: None          Prior Functioning/Environment Level of Independence: Independent        Comments: states no balance problems prior, does not drive        OT Problem List: Impaired balance (sitting and/or standing)   OT Treatment/Interventions:      OT Goals(Current goals can be found in the care plan section) Acute Rehab OT Goals Patient Stated Goal: go home today  OT Frequency:     Barriers to D/C:  Co-evaluation              End of Session Equipment Utilized During Treatment: Gait belt  Activity Tolerance: Patient tolerated treatment well Patient left: with call bell/phone within reach;in bed   Time: 1039-1059 OT Time Calculation (min): 20 min Charges:  OT General Charges $OT Visit: 1 Procedure OT Evaluation $OT Eval Low Complexity: 1 Procedure G-Codes:    Evern BioMayberry, Demoni Gergen Lynn 10/29/2016, 11:04 AM

## 2016-10-30 ENCOUNTER — Other Ambulatory Visit: Payer: Self-pay | Admitting: Neurology

## 2016-10-30 DIAGNOSIS — I6302 Cerebral infarction due to thrombosis of basilar artery: Secondary | ICD-10-CM

## 2016-10-30 DIAGNOSIS — H34231 Retinal artery branch occlusion, right eye: Secondary | ICD-10-CM

## 2016-11-01 ENCOUNTER — Other Ambulatory Visit: Payer: Self-pay

## 2016-11-01 NOTE — Patient Outreach (Signed)
[facility] Care Management  11/01/2016  Nils FlackLiz Mundie 1971-11-20 [id number]       EMMI-Stroke RED ON EMMI ALERT Day # 1 Date: 10/31/16 Red Alert Reason: "Scheduled a follow up appt? No"    Outreach attempt #1 to patient. Spoke with patient. Addressed and discussed red alert. Patient confirmed that she does have her f/u appts in place. She reports that she goes to see PCP on 12/20. She has made neuro appt for 12/27/16. Patient reports that her mother will be taking her to appts. She voices that she was able to get all her meds filled. Denies any issues/questions or concerns regarding her meds. No further RN CM needs or concerns at this time. Advised patient that she will continue to receive automated EMMI  Stroke calls for duration of program and will receive call from RN CM if any of her responses are abnormal. She voiced understanding and was appreciative of call.     Plan: RN CM will notify [facility] administrative assistant of case closure status.   Antionette Fairyoshanda Kaheem Halleck, RN,BSN,CCM [facility] Care Management Telephonic Care Management Coordinator Direct Phone: [phone] Toll Free: [phone] Fax: [phone]

## 2016-11-14 ENCOUNTER — Encounter: Payer: Self-pay | Admitting: Critical Care Medicine

## 2016-11-14 ENCOUNTER — Ambulatory Visit: Payer: Self-pay | Attending: Critical Care Medicine | Admitting: Critical Care Medicine

## 2016-11-14 VITALS — BP 127/82 | HR 66 | Temp 98.4°F | Resp 18 | Ht 65.0 in | Wt 226.2 lb

## 2016-11-14 DIAGNOSIS — Z87891 Personal history of nicotine dependence: Secondary | ICD-10-CM | POA: Insufficient documentation

## 2016-11-14 DIAGNOSIS — I6359 Cerebral infarction due to unspecified occlusion or stenosis of other cerebral artery: Secondary | ICD-10-CM

## 2016-11-14 DIAGNOSIS — I69398 Other sequelae of cerebral infarction: Secondary | ICD-10-CM | POA: Insufficient documentation

## 2016-11-14 DIAGNOSIS — R7989 Other specified abnormal findings of blood chemistry: Secondary | ICD-10-CM

## 2016-11-14 DIAGNOSIS — Z23 Encounter for immunization: Secondary | ICD-10-CM

## 2016-11-14 DIAGNOSIS — Z7902 Long term (current) use of antithrombotics/antiplatelets: Secondary | ICD-10-CM | POA: Insufficient documentation

## 2016-11-14 DIAGNOSIS — R9431 Abnormal electrocardiogram [ECG] [EKG]: Secondary | ICD-10-CM

## 2016-11-14 DIAGNOSIS — F3130 Bipolar disorder, current episode depressed, mild or moderate severity, unspecified: Secondary | ICD-10-CM

## 2016-11-14 DIAGNOSIS — H5461 Unqualified visual loss, right eye, normal vision left eye: Secondary | ICD-10-CM | POA: Insufficient documentation

## 2016-11-14 DIAGNOSIS — Z7982 Long term (current) use of aspirin: Secondary | ICD-10-CM | POA: Insufficient documentation

## 2016-11-14 DIAGNOSIS — E876 Hypokalemia: Secondary | ICD-10-CM

## 2016-11-14 DIAGNOSIS — E871 Hypo-osmolality and hyponatremia: Secondary | ICD-10-CM

## 2016-11-14 DIAGNOSIS — R748 Abnormal levels of other serum enzymes: Secondary | ICD-10-CM

## 2016-11-14 DIAGNOSIS — F319 Bipolar disorder, unspecified: Secondary | ICD-10-CM | POA: Insufficient documentation

## 2016-11-14 DIAGNOSIS — I10 Essential (primary) hypertension: Secondary | ICD-10-CM | POA: Insufficient documentation

## 2016-11-14 DIAGNOSIS — R778 Other specified abnormalities of plasma proteins: Secondary | ICD-10-CM

## 2016-11-14 DIAGNOSIS — F17200 Nicotine dependence, unspecified, uncomplicated: Secondary | ICD-10-CM

## 2016-11-14 DIAGNOSIS — Z79899 Other long term (current) drug therapy: Secondary | ICD-10-CM | POA: Insufficient documentation

## 2016-11-14 MED ORDER — AMLODIPINE BESYLATE 5 MG PO TABS: 5.0000 mg | ORAL_TABLET | Freq: Every day | ORAL | 3 refills | Status: DC

## 2016-11-14 MED ORDER — FLUTICASONE PROPIONATE 50 MCG/ACT NA SUSP: 1.0000 | Freq: Every day | NASAL | 6 refills | Status: DC

## 2016-11-14 MED ORDER — CETIRIZINE HCL 10 MG PO CHEW: 10.0000 mg | CHEWABLE_TABLET | Freq: Every day | ORAL | 6 refills | Status: DC | PRN

## 2016-11-14 MED ORDER — CITALOPRAM HYDROBROMIDE 20 MG PO TABS: 20.0000 mg | ORAL_TABLET | Freq: Every day | ORAL | 6 refills | Status: DC

## 2016-11-14 MED ORDER — ATORVASTATIN CALCIUM 20 MG PO TABS
20.0000 mg | ORAL_TABLET | Freq: Every day | ORAL | 6 refills | Status: DC
Start: 2016-11-14 — End: 2017-02-15

## 2016-11-14 MED ORDER — HYDROXYZINE HCL 25 MG PO TABS: 25.0000 mg | ORAL_TABLET | Freq: Three times a day (TID) | ORAL | 6 refills | Status: DC

## 2016-11-14 MED ORDER — LURASIDONE HCL 20 MG PO TABS: 20.0000 mg | ORAL_TABLET | Freq: Every day | ORAL | 6 refills | Status: DC

## 2016-11-14 MED ORDER — LISINOPRIL-HYDROCHLOROTHIAZIDE 20-12.5 MG PO TABS: 1.0000 | ORAL_TABLET | Freq: Every day | ORAL | 3 refills | Status: DC

## 2016-11-14 MED ORDER — CLOPIDOGREL BISULFATE 75 MG PO TABS: 75.0000 mg | ORAL_TABLET | Freq: Every day | ORAL | 0 refills | Status: DC

## 2016-11-14 MED ORDER — METOPROLOL TARTRATE 50 MG PO TABS: 50.0000 mg | ORAL_TABLET | Freq: Two times a day (BID) | ORAL | 3 refills | Status: DC

## 2016-11-14 MED FILL — LISINOPRIL-HCTZ 20-12.5 MG: 20-12.5 | 30 days supply | Qty: 30 | Fill #0

## 2016-11-14 MED FILL — CITALOPRAM HBR 20 MG TABLET: 20 | 30 days supply | Qty: 30 | Fill #0

## 2016-11-14 MED FILL — ?HYDROXYZINE HCL 25 MG TAB: 25 | 30 days supply | Qty: 90 | Fill #0

## 2016-11-14 MED FILL — ?ATORVASTATIN 20 MG TABLET: 20 | 30 days supply | Qty: 30 | Fill #0

## 2016-11-14 NOTE — Progress Notes (Signed)
Patient is here for Stroke FU  Patient complains of burning on the left side of the body after stroke.  Patient has taken medication today. Patient has eaten today.  Patient would like flu vaccine today. Patient tolerated flu vaccine well today.

## 2016-11-14 NOTE — Patient Instructions (Signed)
Stay on all medications Refills sent to our pharmacy Keep stroke clinic appt Return with primary care

## 2016-11-14 NOTE — Progress Notes (Signed)
Subjective:    Patient ID: Kendra Vega, female    DOB: [birth year], 45 y.o.   MRN: [id number]  45 y.o. 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   Pt admitted and found to have CVA R lateral medullary area.    Adm 12/2  D/c to home 12/4    Neurologic Problem  The patient's primary symptoms include focal sensory loss, focal weakness, a visual change and weakness. The patient's pertinent negatives include no altered mental status, clumsiness, loss of balance, memory loss, near-syncope, slurred speech or syncope. This is a new problem. The current episode started 1 to 4 weeks ago. The neurological problem developed suddenly. The problem has been gradually improving since onset. There was right-sided focality noted. Pertinent negatives include no abdominal pain, auditory change, aura, back pain, bladder incontinence, bowel incontinence, chest pain, confusion, diaphoresis, dizziness, fatigue, fever, headaches, light-headedness, nausea, neck pain, palpitations, shortness of breath, vertigo or vomiting. Treatments tried: asa, plavix. The treatment provided significant relief. Her past medical history is significant for a CVA. There is no history of a bleeding disorder, a clotting disorder, dementia, head trauma, liver disease, mood changes or seizures. (Prior retinal artery occlusion with blindness Rx with ASA  R eye )   No TPA given due to Patient presented outside of TPA window She presented with right side headache, right facial numbness, left lower extremity tingling, dizziness, unsteady gain, trouble swallowing H/o right eye blindness from prior CVA in 2013, she has been on asa 81mg  daily prior to coming the the hospital Overall still sore,  numbness on R side and is better Min dyspnea  Past Medical History:  Diagnosis Date  . 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 Hypertension   . Stroke [facility](HCC)      Family History  Problem Relation Age of Onset  . Diabetes Mother   . Hypertension Mother   . Hypertension Sister      Social History   Social History  . Marital status: Single    Spouse name: N/A  . Number of children: N/A  . Years of education: N/A   Occupational History  . Not on file.   Social History Main Topics  . Smoking status: Former Smoker    Packs/day: 0.50    Years: 7.00    Quit date: 10/31/2016  . Smokeless tobacco: Never Used  . Alcohol use No  . Drug use:     Frequency: 1.0 time per week    Types: Marijuana     Comment: last used marijuana last night, 06/08/15  . Sexual activity: Not on file   Other Topics Concern  . Not on file   Social History Narrative  . No narrative on file     No Known Allergies   Outpatient Medications Prior to Visit  Medication Sig Dispense Refill  . acetaminophen (TYLENOL) 500 MG tablet Take 1,000 mg by mouth every 6 (six) hours as needed.    . Chlorpheniramine-APAP (CORICIDIN) 2-325 MG TABS Take 1 tablet by mouth daily as needed (cold).    Marland Kitchen. amLODipine (NORVASC) 5 MG tablet Take 1 tablet (5 mg total) by mouth daily. 30 tablet 3  . atorvastatin (LIPITOR) 20 MG tablet Take 1 tablet (20 mg total)  by mouth daily at 6 PM. 30 tablet 0  . cetirizine (ZYRTEC) 10 MG chewable tablet Chew 10 mg by mouth daily as needed for allergies.    . citalopram (CELEXA) 20 MG tablet Take 20 mg by mouth daily.    . clopidogrel (PLAVIX) 75 MG tablet Take 1 tablet (75 mg total) by mouth daily. 30 tablet 0  . fluticasone (FLONASE) 50 MCG/ACT nasal spray Place 1 spray into both nostrils daily. 16 g 0  . hydrOXYzine (ATARAX/VISTARIL) 25 MG tablet Take 25 mg by mouth 3 (three) times daily.     Marland Kitchen lisinopril-hydrochlorothiazide (PRINZIDE,ZESTORETIC) 20-12.5 MG tablet Take 1 tablet by  mouth daily. 30 tablet 3  . Lurasidone HCl 20 MG TABS Take 20 mg by mouth at bedtime.     . metoprolol (LOPRESSOR) 50 MG tablet Take 1 tablet (50 mg total) by mouth 2 (two) times daily. 60 tablet 3  . polyethylene glycol powder (GLYCOLAX/MIRALAX) powder Take 17 g by mouth daily. (Patient not taking: Reported on 11/14/2016) 850 g 2   No facility-administered medications prior to visit.     Review of Systems  Constitutional: Negative for diaphoresis, fatigue, fever and unexpected weight change.  HENT: Negative.  Negative for ear pain, postnasal drip, rhinorrhea, sinus pressure, sore throat, trouble swallowing and voice change.   Eyes: Negative.   Respiratory: Negative.  Negative for apnea, cough, choking, chest tightness, shortness of breath, wheezing and stridor.   Cardiovascular: Negative.  Negative for chest pain, palpitations, leg swelling and near-syncope.  Gastrointestinal: Negative.  Negative for abdominal distention, abdominal pain, bowel incontinence, nausea and vomiting.  Genitourinary: Negative.  Negative for bladder incontinence.  Musculoskeletal: Negative.  Negative for arthralgias, back pain, myalgias and neck pain.  Skin: Negative.  Negative for rash.  Allergic/Immunologic: Negative.  Negative for environmental allergies and food allergies.  Neurological: Positive for focal weakness, weakness and numbness. Negative for dizziness, vertigo, tremors, seizures, syncope, facial asymmetry, speech difficulty, light-headedness, headaches and loss of balance.  Hematological: Negative.  Negative for adenopathy. Does not bruise/bleed easily.  Psychiatric/Behavioral: Negative.  Negative for agitation, confusion, memory loss and sleep disturbance. The patient is not nervous/anxious.        Objective:   Physical Exam Vitals:   11/14/16 1131  BP: 127/82  Pulse: 66  Resp: 18  Temp: 98.4 F (36.9 C)  TempSrc: Oral  SpO2: 100%  Weight: 226 lb 3.2 oz (102.6 kg)  Height: 5\' 5"  (1.651 m)     Gen: Pleasant, well-nourished, in no distress,  normal affect  ENT: No lesions,  mouth clear,  oropharynx clear, no postnasal drip  Neck: No JVD, no TMG, no carotid bruits  Lungs: No use of accessory muscles, no dullness to percussion, clear without rales or rhonchi  Cardiovascular: RRR, heart sounds normal, no murmur or gallops, no peripheral edema  Abdomen: soft and NT, no HSM,  BS normal  Musculoskeletal: No deformities, no cyanosis or clubbing  Neuro: alert, non focal  Min numbness R scalp.  No motor deficits  Skin: Warm, no lesions or rashes  No results found.  All labs/ mri/ data from recent hosp stay reviewed  MRI brain: "1. 6 mm acute ischemic right lateral medullary infarct. No associated hemorrhage or mass effect. 2. Otherwise normal MRI of the brain for patient age."  MRA head/neck " IMPRESSION: 1. No acute finding, including evidence of dissection. No noted flow limiting stenosis. 2. Mild atheromatous changes in the posterior circulation.  3. 2 mm left supraclinoid ICA aneurysm."  Assessment & Plan:  I personally reviewed all images and lab [facility] as well as any outside material available during this office visit and agree with the  radiology impressions.   HYPERTENSION, BENIGN ESSENTIAL HTN stable at present  Refill all bp meds  Stroke (cerebrum) (HCC) R medullary tract CVA on MRI Improved with Rx Plan F/u with neurology  All meds refilled Stay on plavix/asa  TOBACCO ABUSE Currently off tobacco products 3-10 min counseling issued  Bipolar depression (HCC) [facility]  [facility]  Prolonged Q-T interval on ECG On admission Resolved with electrolyte Rx  Elevated troponin Resolved No AMI  Hyponatremia Resolved   Hypokalemia Resolved    Marisue IvanLiz was seen today for hospitalization follow-up.  Diagnoses and all orders for this visit:  Cerebrovascular accident (CVA) due to occlusion of other cerebral  artery (HCC)  Essential hypertension -     metoprolol (LOPRESSOR) 50 MG tablet; Take 1 tablet (50 mg total) by mouth 2 (two) times daily. -     lisinopril-hydrochlorothiazide (PRINZIDE,ZESTORETIC) 20-12.5 MG tablet; Take 1 tablet by mouth daily. -     amLODipine (NORVASC) 5 MG tablet; Take 1 tablet (5 mg total) by mouth daily.  Needs flu shot -     Flu Vaccine QUAD 36+ mos PF IM (Fluarix & Fluzone Quad PF)  HYPERTENSION, BENIGN ESSENTIAL  TOBACCO ABUSE  Bipolar depression (HCC)  Prolonged Q-T interval on ECG  Elevated troponin  Hyponatremia  Hypokalemia  Other orders -     lurasidone (LATUDA) 20 MG TABS tablet; Take 1 tablet (20 mg total) by mouth at bedtime. -     hydrOXYzine (ATARAX/VISTARIL) 25 MG tablet; Take 1 tablet (25 mg total) by mouth 3 (three) times daily. -     fluticasone (FLONASE) 50 MCG/ACT nasal spray; Place 1 spray into both nostrils daily. -     clopidogrel (PLAVIX) 75 MG tablet; Take 1 tablet (75 mg total) by mouth daily. -     citalopram (CELEXA) 20 MG tablet; Take 1 tablet (20 mg total) by mouth daily. -     cetirizine (ZYRTEC) 10 MG chewable tablet; Chew 1 tablet (10 mg total) by mouth daily as needed for allergies. -     atorvastatin (LIPITOR) 20 MG tablet; Take 1 tablet (20 mg total) by mouth daily at 6 PM.

## 2016-11-15 ENCOUNTER — Encounter: Payer: Self-pay | Admitting: Critical Care Medicine

## 2016-11-15 NOTE — Assessment & Plan Note (Signed)
Currently off tobacco products 3-10 min counseling issued

## 2016-11-15 NOTE — Assessment & Plan Note (Signed)
Resolved

## 2016-11-15 NOTE — Assessment & Plan Note (Signed)
On admission Resolved with electrolyte Rx

## 2016-11-15 NOTE — Assessment & Plan Note (Addendum)
HTN stable at present  Refill all bp meds

## 2016-11-15 NOTE — Assessment & Plan Note (Signed)
R medullary tract CVA on MRI Improved with Rx Plan F/u with neurology  All meds refilled Stay on plavix/asa

## 2016-11-15 NOTE — Assessment & Plan Note (Signed)
Resolved No AMI

## 2016-11-15 NOTE — Assessment & Plan Note (Signed)
[facility]  [facility]

## 2016-11-15 NOTE — Assessment & Plan Note (Signed)
>>  ASSESSMENT AND PLAN FOR BIPOLAR DEPRESSION ([place]) WRITTEN ON 11/15/2016  1:48 PM BY WRIGHT, Burnett Harry, MD  Bipolar  Rx at [facility] Refilled meds

## 2016-11-23 MED FILL — CLOPIDOGREL 75 MG TABLET: 75 | 30 days supply | Qty: 30 | Fill #0

## 2016-11-23 MED FILL — ?AMLODIPINE BESYLATE 5 MG T: 5 | 30 days supply | Qty: 30 | Fill #0

## 2016-11-23 MED FILL — METOPROLOL TARTRATE 50 MG T: 50 | 30 days supply | Qty: 60 | Fill #0

## 2016-12-12 ENCOUNTER — Ambulatory Visit: Payer: Self-pay | Admitting: Family Medicine

## 2016-12-25 ENCOUNTER — Other Ambulatory Visit: Payer: Self-pay | Admitting: Critical Care Medicine

## 2016-12-25 MED FILL — ?AMLODIPINE BESYLATE 5 MG T: 5 | 30 days supply | Qty: 30 | Fill #1

## 2016-12-25 MED FILL — ATORVASTATIN 20 MG TABLET: 20 | 30 days supply | Qty: 30 | Fill #1

## 2016-12-25 MED FILL — LISINOPRIL-HCTZ 20-12.5 MG: 20-12.5 | 30 days supply | Qty: 30 | Fill #1

## 2016-12-25 MED FILL — METOPROLOL TARTRATE 50 MG T: 50 | 30 days supply | Qty: 60 | Fill #1

## 2016-12-26 ENCOUNTER — Other Ambulatory Visit: Payer: Self-pay | Admitting: Critical Care Medicine

## 2016-12-27 ENCOUNTER — Ambulatory Visit: Payer: Self-pay | Admitting: Nurse Practitioner

## 2016-12-28 ENCOUNTER — Encounter: Payer: Self-pay | Admitting: Nurse Practitioner

## 2016-12-28 ENCOUNTER — Other Ambulatory Visit: Payer: Self-pay | Admitting: Family Medicine

## 2016-12-28 NOTE — Telephone Encounter (Signed)
Patient is here requesting an urgent refill of her blood thinners. She is out and needs it. Please f/u

## 2016-12-31 ENCOUNTER — Other Ambulatory Visit: Payer: Self-pay | Admitting: Critical Care Medicine

## 2017-01-01 MED ORDER — CLOPIDOGREL BISULFATE 75 MG PO TABS: 75.0000 mg | ORAL_TABLET | Freq: Every day | ORAL | 2 refills | Status: DC

## 2017-01-01 NOTE — Telephone Encounter (Signed)
Refilled

## 2017-01-01 NOTE — Telephone Encounter (Signed)
Writer LVM and informed patient that MD refilled her blood thinner and she needs to call and schedule appt with Dr. Venetia NightAmao.  Patient encouraged to call with any questions.

## 2017-01-28 MED FILL — METOPROLOL TARTRATE 50 MG T: 50 | 30 days supply | Qty: 60 | Fill #2

## 2017-01-28 MED FILL — ATORVASTATIN 20 MG TABLET: 20 | 30 days supply | Qty: 30 | Fill #2

## 2017-01-28 MED FILL — LISINOPRIL-HCTZ 20-12.5 MG: 20-12.5 | 30 days supply | Qty: 30 | Fill #2

## 2017-01-28 MED FILL — AMLODIPINE BESYLATE 5 MG TA: 5 | 30 days supply | Qty: 30 | Fill #2

## 2017-02-07 ENCOUNTER — Ambulatory Visit: Payer: Self-pay | Admitting: Nurse Practitioner

## 2017-02-08 ENCOUNTER — Encounter: Payer: Self-pay | Admitting: Neurology

## 2017-02-15 ENCOUNTER — Ambulatory Visit: Payer: Self-pay | Attending: Family Medicine | Admitting: Family Medicine

## 2017-02-15 ENCOUNTER — Encounter: Payer: Self-pay | Admitting: Family Medicine

## 2017-02-15 ENCOUNTER — Ambulatory Visit: Payer: Self-pay

## 2017-02-15 VITALS — BP 126/81 | HR 74 | Temp 98.4°F | Ht 65.0 in | Wt 223.4 lb

## 2017-02-15 DIAGNOSIS — Z7902 Long term (current) use of antithrombotics/antiplatelets: Secondary | ICD-10-CM | POA: Insufficient documentation

## 2017-02-15 DIAGNOSIS — H34231 Retinal artery branch occlusion, right eye: Secondary | ICD-10-CM

## 2017-02-15 DIAGNOSIS — Z87891 Personal history of nicotine dependence: Secondary | ICD-10-CM | POA: Insufficient documentation

## 2017-02-15 DIAGNOSIS — F319 Bipolar disorder, unspecified: Secondary | ICD-10-CM

## 2017-02-15 DIAGNOSIS — F3130 Bipolar disorder, current episode depressed, mild or moderate severity, unspecified: Secondary | ICD-10-CM

## 2017-02-15 DIAGNOSIS — I10 Essential (primary) hypertension: Secondary | ICD-10-CM

## 2017-02-15 DIAGNOSIS — H5461 Unqualified visual loss, right eye, normal vision left eye: Secondary | ICD-10-CM | POA: Insufficient documentation

## 2017-02-15 DIAGNOSIS — Z8673 Personal history of transient ischemic attack (TIA), and cerebral infarction without residual deficits: Secondary | ICD-10-CM

## 2017-02-15 LAB — COMPLETE METABOLIC PANEL WITH GFR
ALBUMIN: 3.7 g/dL (ref 3.6–5.1)
ALK PHOS: 98 U/L (ref 33–115)
ALT: 9 U/L (ref 6–29)
AST: 12 U/L (ref 10–35)
BUN: 12 mg/dL (ref 7–25)
CHLORIDE: 102 mmol/L (ref 98–110)
CO2: 24 mmol/L (ref 20–31)
Calcium: 9 mg/dL (ref 8.6–10.2)
Creat: 1.32 mg/dL — ABNORMAL HIGH (ref 0.50–1.10)
GFR, Est African American: 56 mL/min — ABNORMAL LOW (ref >=60)
GFR, Est Non African American: 48 mL/min — ABNORMAL LOW (ref >=60)
GLUCOSE: 97 mg/dL (ref 65–99)
POTASSIUM: 3.5 mmol/L (ref 3.5–5.3)
SODIUM: 137 mmol/L (ref 135–146)
Total Bilirubin: 0.3 mg/dL (ref 0.2–1.2)
Total Protein: 6.8 g/dL (ref 6.1–8.1)

## 2017-02-15 MED ORDER — METOPROLOL TARTRATE 50 MG PO TABS: 50.0000 mg | ORAL_TABLET | Freq: Two times a day (BID) | ORAL | 6 refills | Status: DC

## 2017-02-15 MED ORDER — ATORVASTATIN CALCIUM 20 MG PO TABS: 20.0000 mg | ORAL_TABLET | Freq: Every day | ORAL | 6 refills | Status: DC

## 2017-02-15 MED ORDER — LISINOPRIL-HYDROCHLOROTHIAZIDE 20-12.5 MG PO TABS: 1.0000 | ORAL_TABLET | Freq: Every day | ORAL | 6 refills | Status: DC

## 2017-02-15 MED ORDER — AMLODIPINE BESYLATE 5 MG PO TABS: 5.0000 mg | ORAL_TABLET | Freq: Every day | ORAL | 6 refills | Status: DC

## 2017-02-15 MED ORDER — CLOPIDOGREL BISULFATE 75 MG PO TABS: 75.0000 mg | ORAL_TABLET | Freq: Every day | ORAL | 2 refills | Status: DC

## 2017-02-15 NOTE — Progress Notes (Signed)
Subjective:  Patient ID: Kendra Vega, female    DOB: 07/31/1971  Age: 46 y.o. 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  She has been compliant with her antihypertensives and statin and is requesting refills of medication. She has quit smoking and is currently exercising to lose weight. Denies chest pains, shortness of breath or pedal edema.  She receives mental health care at [facility] on her last visit was 1 month ago.  She denies any acute concerns today.  Past Medical History:  Diagnosis Date  . 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 Hypertension   . [facility]roke [facility])     No past surgical history on file.  No Known Allergies   Outpatient Medications Prior to Visit  Medication Sig Dispense Refill  . acetaminophen (TYLENOL) 500 MG tablet Take 1,000 mg by mouth every 6 (six) hours as needed.    . cetirizine (ZYRTEC) 10 MG chewable tablet Chew 1 tablet (10 mg total) by mouth daily as needed for allergies. 30 tablet 6  . Chlorpheniramine-APAP (CORICIDIN) 2-325 MG TABS Take 1 tablet by mouth daily as needed (cold).    . citalopram (CELEXA) 20 MG tablet Take 1 tablet (20 mg total) by mouth daily. 30 tablet 6  . fluticasone (FLONASE) 50 MCG/ACT nasal spray Place 1 spray into both nostrils daily. 16 g 6  . hydrOXYzine (ATARAX/VISTARIL) 25 MG tablet Take 1 tablet (25 mg total) by mouth 3 (three) times daily. 90 tablet 6  . lurasidone (LATUDA) 20 MG TABS tablet Take 1 tablet (20 mg total) by mouth at bedtime. 60 tablet 6  . amLODipine (NORVASC) 5 MG tablet Take 1 tablet (5 mg total) by mouth daily. 30 tablet 3  . atorvastatin (LIPITOR) 20 MG tablet Take 1 tablet (20 mg total) by mouth daily at 6 PM. 30 tablet 6  . clopidogrel (PLAVIX)  75 MG tablet Take 1 tablet (75 mg total) by mouth daily. 30 tablet 2  . lisinopril-hydrochlorothiazide (PRINZIDE,ZESTORETIC) 20-12.5 MG tablet Take 1 tablet by mouth daily. 30 tablet 3  . metoprolol (LOPRESSOR) 50 MG tablet Take 1 tablet (50 mg total) by mouth 2 (two) times daily. 60 tablet 3  . polyethylene glycol powder (GLYCOLAX/MIRALAX) powder Take 17 g by mouth daily. (Patient not taking: Reported on 11/14/2016) 850 g 2   No facility-administered medications prior to visit.     ROS Review of Systems  Constitutional: Negative for activity change, appetite change and fatigue.  HENT: Negative for congestion, sinus pressure and sore throat.   Eyes: Positive for visual disturbance (right eye blindness ).  Respiratory: Negative for cough, chest tightness, shortness of breath and wheezing.   Cardiovascular: Negative for chest pain and palpitations.  Gastrointestinal: Negative for abdominal distention, abdominal pain and constipation.  Endocrine: Negative for polydipsia.  Genitourinary: Negative for dysuria and frequency.  Musculoskeletal: Negative for arthralgias and back pain.  Skin: Negative for rash.  Neurological: Negative for tremors, light-headedness and numbness.  Hematological: Does not bruise/bleed easily.  Psychiatric/Behavioral: Negative for agitation and behavioral problems.    Objective:  BP 126/81   Pulse 74   Temp 98.4 F (36.9 C) (Oral)   Ht 5\' 5"  (1.651 m)   Wt 223 lb 6.4 oz (101.3  kg)   LMP 02/08/2017   SpO2 99%   BMI 37.18 kg/m   BP/Weight 02/15/2017 11/14/2016 10/29/2016  Systolic BP 126 127 153  Diastolic BP 81 82 92  Wt. (Lbs) 223.4 226.2 -  BMI 37.18 37.64 -      Physical Exam  Constitutional: She is oriented to person, place, and time. She appears well-developed and well-nourished.  HENT:  Right Ear: External ear normal.  Left Ear: External ear normal.  Cardiovascular: Normal rate, normal heart sounds and intact distal pulses.   No murmur  heard. Pulmonary/Chest: Effort normal and breath sounds normal. She has no wheezes. She has no rales. She exhibits no tenderness.  Abdominal: Soft. Bowel sounds are normal. She exhibits no distension and no mass. There is no tenderness.  Musculoskeletal: Normal range of motion.  Neurological: She is alert and oriented to person, place, and time.  Skin: Skin is warm and dry.  Psychiatric: She has a normal mood and affect.    CMP Latest Ref Rng & Units 10/29/2016 10/28/2016 10/27/2016  Glucose 65 - 99 mg/dL 161(W) 960(A) 540(J)  BUN 6 - 20 mg/dL 8 13 10   Creatinine 0.44 - 1.00 mg/dL 8.11(B) 1.47(W) 2.95(A)  Sodium 135 - 145 mmol/L 137 137 133(L)  Potassium 3.5 - 5.1 mmol/L 3.6 3.5 2.9(L)  Chloride 101 - 111 mmol/L 103 103 100(L)  CO2 22 - 32 mmol/L 26 25 23   Calcium 8.9 - 10.3 mg/dL 2.1(H) 0.8(M) 5.7(Q)  Total Protein 6.5 - 8.1 g/dL 6.3(L) - -  Total Bilirubin 0.3 - 1.2 mg/dL 4.6(N) - -  Alkaline Phos 38 - 126 U/L 65 - -  AST 15 - 41 U/L 35 - -  ALT 14 - 54 U/L 30 - -    Lipid Panel     Component Value Date/Time   CHOL 164 10/28/2016 0449   TRIG 281 (H) 10/28/2016 0449   HDL 32 (L) 10/28/2016 0449   CHOLHDL 5.1 10/28/2016 0449   VLDL 56 (H) 10/28/2016 0449   LDLCALC 76 10/28/2016 0449    Assessment & Plan:   1. Essential hypertension Controlled - amLODipine (NORVASC) 5 MG tablet; Take 1 tablet (5 mg total) by mouth daily.  Dispense: 30 tablet; Refill: 6 - lisinopril-hydrochlorothiazide (PRINZIDE,ZESTORETIC) 20-12.5 MG tablet; Take 1 tablet by mouth daily.  Dispense: 30 tablet; Refill: 6 - metoprolol (LOPRESSOR) 50 MG tablet; Take 1 tablet (50 mg total) by mouth 2 (two) times daily.  Dispense: 60 tablet; Refill: 6 - COMPLETE METABOLIC PANEL WITH GFR  2. Retinal artery branch occlusion of right eye   3. Bipolar depression (HCC) Stable Continue hydroxyzine, Latuda, Celexa Keep appointment with mental health admonished  4. History of CVA (cerebrovascular accident) No  residual deficit - atorvastatin (LIPITOR) 20 MG tablet; Take 1 tablet (20 mg total) by mouth daily at 6 PM.  Dispense: 30 tablet; Refill: 6 - clopidogrel (PLAVIX) 75 MG tablet; Take 1 tablet (75 mg total) by mouth daily.  Dispense: 30 tablet; Refill: 2   Meds ordered this encounter  Medications  . atorvastatin (LIPITOR) 20 MG tablet    Sig: Take 1 tablet (20 mg total) by mouth daily at 6 PM.    Dispense:  30 tablet    Refill:  6  . clopidogrel (PLAVIX) 75 MG tablet    Sig: Take 1 tablet (75 mg total) by mouth daily.    Dispense:  30 tablet    Refill:  2  . amLODipine (NORVASC) 5 MG tablet  Sig: Take 1 tablet (5 mg total) by mouth daily.    Dispense:  30 tablet    Refill:  6  . lisinopril-hydrochlorothiazide (PRINZIDE,ZESTORETIC) 20-12.5 MG tablet    Sig: Take 1 tablet by mouth daily.    Dispense:  30 tablet    Refill:  6  . metoprolol (LOPRESSOR) 50 MG tablet    Sig: Take 1 tablet (50 mg total) by mouth 2 (two) times daily.    Dispense:  60 tablet    Refill:  6    Follow-up: Return in about 1 month (around 03/18/2017) for Complete physical exam.   Jaclyn ShaggyEnobong Amao MD

## 2017-02-15 NOTE — Patient Instructions (Signed)

## 2017-02-25 ENCOUNTER — Telehealth: Payer: Self-pay

## 2017-02-25 NOTE — Telephone Encounter (Signed)
Writer called patient with lab results.  Patient states an understanding of these results.

## 2017-02-26 ENCOUNTER — Telehealth: Payer: Self-pay

## 2017-02-26 NOTE — Telephone Encounter (Signed)
-----   Message from Guy Franco, RN sent at 02/26/2017 11:59 AM EDT ----- LMOVM to return call.

## 2017-02-26 NOTE — Telephone Encounter (Signed)
Writer called patient and discussed her lab results.  Patient stated understanding.

## 2017-02-27 MED FILL — LISINOPRIL-HCTZ 20-12.5 MG: 20-12.5 | 30 days supply | Qty: 30 | Fill #3

## 2017-02-27 MED FILL — ATORVASTATIN 20 MG TABLET: 20 | 30 days supply | Qty: 30 | Fill #3

## 2017-02-27 MED FILL — AMLODIPINE BESYLATE 5 MG TA: 5 | 30 days supply | Qty: 30 | Fill #3

## 2017-02-27 MED FILL — METOPROLOL TARTRATE 50 MG T: 50 | 30 days supply | Qty: 60 | Fill #3

## 2017-03-15 ENCOUNTER — Other Ambulatory Visit: Payer: Self-pay | Admitting: Family Medicine

## 2017-03-29 ENCOUNTER — Other Ambulatory Visit: Payer: Self-pay | Admitting: Critical Care Medicine

## 2017-03-29 DIAGNOSIS — I10 Essential (primary) hypertension: Secondary | ICD-10-CM

## 2017-03-29 MED FILL — ATORVASTATIN 20 MG TABLET: 20 | 30 days supply | Qty: 30 | Fill #4

## 2017-04-01 MED FILL — CLOPIDOGREL 75 MG TABLET: 75 | 30 days supply | Qty: 30 | Fill #0

## 2017-04-04 MED FILL — METOPROLOL TARTRATE 50 MG T: 50 | 30 days supply | Qty: 60 | Fill #0

## 2017-04-04 MED FILL — LISINOPRIL-HCTZ 20-12.5 MG: 20-12.5 | 30 days supply | Qty: 30 | Fill #0

## 2017-04-04 MED FILL — AMLODIPINE BESYLATE 5 MG TA: 5 | 30 days supply | Qty: 30 | Fill #0

## 2017-04-05 ENCOUNTER — Other Ambulatory Visit: Payer: Self-pay | Admitting: Family Medicine

## 2017-05-06 MED FILL — ?METOPROLOL 50 MG TABLET: 50 | 30 days supply | Qty: 60 | Fill #1

## 2017-05-06 MED FILL — AMLODIPINE BESYLATE 5 MG TA: 5 | 30 days supply | Qty: 30 | Fill #1

## 2017-05-06 MED FILL — ATORVASTATIN 20 MG TABLET: 20 | 30 days supply | Qty: 30 | Fill #5

## 2017-05-06 MED FILL — CLOPIDOGREL 75 MG TABLET: 75 | 30 days supply | Qty: 30 | Fill #1

## 2017-05-06 MED FILL — LISINOPRIL-HCTZ 20-12.5 MG: 20-12.5 | 30 days supply | Qty: 30 | Fill #1

## 2017-06-06 ENCOUNTER — Other Ambulatory Visit: Payer: Self-pay | Admitting: Family Medicine

## 2017-06-06 ENCOUNTER — Other Ambulatory Visit: Payer: Self-pay | Admitting: Critical Care Medicine

## 2017-06-06 DIAGNOSIS — Z8673 Personal history of transient ischemic attack (TIA), and cerebral infarction without residual deficits: Secondary | ICD-10-CM

## 2017-06-06 MED FILL — AMLODIPINE BESYLATE 5 MG TA: 5 | 30 days supply | Qty: 30 | Fill #2

## 2017-06-06 MED FILL — hydrOXYzine HCL 25 MG TABS: 25 | 30 days supply | Qty: 90 | Fill #1

## 2017-06-06 MED FILL — LISINOPRIL-HCTZ 20-12.5 MG: 20-12.5 | 30 days supply | Qty: 30 | Fill #2

## 2017-06-06 MED FILL — ?CLOPIDOGREL 75 MG TABLET: 75 | 30 days supply | Qty: 30 | Fill #2

## 2017-06-06 MED FILL — ?ATORVASTATIN 20 MG TABLET: 20 | 30 days supply | Qty: 30 | Fill #6

## 2017-07-10 ENCOUNTER — Other Ambulatory Visit: Payer: Self-pay | Admitting: Family Medicine

## 2017-07-10 DIAGNOSIS — Z8673 Personal history of transient ischemic attack (TIA), and cerebral infarction without residual deficits: Secondary | ICD-10-CM

## 2017-07-10 MED FILL — hydrOXYzine HCL 25 MG TABS: 25 | 30 days supply | Qty: 90 | Fill #2

## 2017-07-10 MED FILL — LISINOPRIL-HCTZ 20-12.5 MG: 20-12.5 | 30 days supply | Qty: 30 | Fill #3

## 2017-07-10 MED FILL — ?ATORVASTATIN 20 MG TABLET: 20 | 30 days supply | Qty: 30 | Fill #0

## 2017-07-10 MED FILL — ?METOPROLOL 50 MG TABLET: 50 | 30 days supply | Qty: 60 | Fill #2

## 2017-07-10 MED FILL — CLOPIDOGREL 75 MG TABLET: 75 | 30 days supply | Qty: 30 | Fill #0

## 2017-07-10 MED FILL — AMLODIPINE BESYLATE 5 MG TA: 5 | 30 days supply | Qty: 30 | Fill #3

## 2017-07-16 ENCOUNTER — Encounter: Payer: Self-pay | Admitting: Family Medicine

## 2017-07-16 ENCOUNTER — Ambulatory Visit: Payer: Self-pay | Attending: Family Medicine | Admitting: Family Medicine

## 2017-07-16 VITALS — BP 129/85 | HR 74 | Temp 98.4°F | Ht 65.0 in | Wt 221.8 lb

## 2017-07-16 DIAGNOSIS — F17210 Nicotine dependence, cigarettes, uncomplicated: Secondary | ICD-10-CM | POA: Insufficient documentation

## 2017-07-16 DIAGNOSIS — F319 Bipolar disorder, unspecified: Secondary | ICD-10-CM

## 2017-07-16 DIAGNOSIS — F3130 Bipolar disorder, current episode depressed, mild or moderate severity, unspecified: Secondary | ICD-10-CM

## 2017-07-16 DIAGNOSIS — M62838 Other muscle spasm: Secondary | ICD-10-CM

## 2017-07-16 DIAGNOSIS — I10 Essential (primary) hypertension: Secondary | ICD-10-CM

## 2017-07-16 DIAGNOSIS — Z7902 Long term (current) use of antithrombotics/antiplatelets: Secondary | ICD-10-CM | POA: Insufficient documentation

## 2017-07-16 DIAGNOSIS — H5461 Unqualified visual loss, right eye, normal vision left eye: Secondary | ICD-10-CM | POA: Insufficient documentation

## 2017-07-16 DIAGNOSIS — Z8673 Personal history of transient ischemic attack (TIA), and cerebral infarction without residual deficits: Secondary | ICD-10-CM

## 2017-07-16 MED ORDER — CLOPIDOGREL BISULFATE 75 MG PO TABS: 75.0000 mg | ORAL_TABLET | Freq: Every day | ORAL | 6 refills | Status: DC

## 2017-07-16 MED ORDER — LISINOPRIL-HYDROCHLOROTHIAZIDE 20-12.5 MG PO TABS: 1.0000 | ORAL_TABLET | Freq: Every day | ORAL | 6 refills | Status: DC

## 2017-07-16 MED ORDER — AMLODIPINE BESYLATE 5 MG PO TABS: 5.0000 mg | ORAL_TABLET | Freq: Every day | ORAL | 6 refills | Status: DC

## 2017-07-16 MED ORDER — METOPROLOL TARTRATE 50 MG PO TABS: 50.0000 mg | ORAL_TABLET | Freq: Two times a day (BID) | ORAL | 6 refills | Status: DC

## 2017-07-16 MED ORDER — ATORVASTATIN CALCIUM 20 MG PO TABS: 20.0000 mg | ORAL_TABLET | Freq: Every day | ORAL | 6 refills | Status: DC

## 2017-07-16 MED ORDER — CYCLOBENZAPRINE HCL 10 MG PO TABS: 10.0000 mg | ORAL_TABLET | Freq: Every evening | ORAL | 2 refills | Status: DC | PRN

## 2017-07-16 MED FILL — ?METOPROLOL 50 MG TABLET: 50 | 30 days supply | Qty: 60 | Fill #0

## 2017-07-16 MED FILL — ?CYCLOBENZAPRINE 10 MG TABL: 10 | 30 days supply | Qty: 30 | Fill #0

## 2017-07-16 NOTE — Progress Notes (Signed)
Subjective:  Patient ID: Kendra Vega, female    DOB: [birth year]  Age: 46 y.o. 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  Informs me that she did have a bout of severe depression but has returned to baseline at this time and has been compliant with her psychotropic medications which she receives from her psychiatrist at [facility]. Denies suicidal ideation or intent.  She has been compliant with her antihypertensives and statin and is requesting refills of medication. She smokes 3 sticks of cigarettes per day. Denies chest pains, shortness of breath or pedal edema.  She does have intermittent muscle spasms in her legs and lower abdomen which are absent at this time. Denies precipitating factors.  Past Medical History:  Diagnosis Date  . 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 Hypertension   . Stroke [facility])     No past surgical history on file.  No Known Allergies   Outpatient Medications Prior to Visit  Medication Sig Dispense Refill  . acetaminophen (TYLENOL) 500 MG tablet Take 1,000 mg by mouth every 6 (six) hours as needed.    . citalopram (CELEXA) 20 MG tablet Take 1 tablet (20 mg total) by mouth daily. 30 tablet 6  . hydrOXYzine (ATARAX/VISTARIL) 25 MG tablet Take 1 tablet (25 mg total) by mouth 3 (three) times daily. 90 tablet 6  . lurasidone (LATUDA) 20 MG TABS tablet Take 1 tablet (20 mg total) by mouth at bedtime. 60 tablet 6  . polyethylene glycol powder (GLYCOLAX/MIRALAX) powder Take 17 g by mouth daily. 850 g 2  . amLODipine (NORVASC) 5 MG tablet Take 1 tablet (5 mg total) by mouth daily. 30 tablet 6  . atorvastatin (LIPITOR) 20 MG tablet Take 1 tablet (20 mg total) by mouth daily at 6 PM. 30 tablet 6  . clopidogrel  (PLAVIX) 75 MG tablet TAKE 1 TABLET (75 MG TOTAL) BY MOUTH DAILY. 30 tablet 0  . lisinopril-hydrochlorothiazide (PRINZIDE,ZESTORETIC) 20-12.5 MG tablet Take 1 tablet by mouth daily. 30 tablet 6  . metoprolol (LOPRESSOR) 50 MG tablet Take 1 tablet (50 mg total) by mouth 2 (two) times daily. 60 tablet 6  . cetirizine (ZYRTEC) 10 MG chewable tablet Chew 1 tablet (10 mg total) by mouth daily as needed for allergies. (Patient not taking: Reported on 07/16/2017) 30 tablet 6  . Chlorpheniramine-APAP (CORICIDIN) 2-325 MG TABS Take 1 tablet by mouth daily as needed (cold).    . fluticasone (FLONASE) 50 MCG/ACT nasal spray Place 1 spray into both nostrils daily. 16 g 6   No facility-administered medications prior to visit.     ROS Review of Systems  Constitutional: Negative for activity change, appetite change and fatigue.  HENT: Negative for congestion, sinus pressure and sore throat.   Eyes: Negative for visual disturbance.  Respiratory: Negative for cough, chest tightness, shortness of breath and wheezing.   Cardiovascular: Negative for chest pain and palpitations.  Gastrointestinal: Negative for abdominal distention, abdominal pain and constipation.  Endocrine: Negative for polydipsia.  Genitourinary: Negative for dysuria and frequency.  Musculoskeletal:       Intermittent muscle spasms  Skin: Negative for rash.  Neurological: Negative for tremors, light-headedness and numbness.  Hematological: Does not bruise/bleed easily.  Psychiatric/Behavioral: Negative for agitation and behavioral  problems.    Objective:  BP 129/85   Pulse 74   Temp 98.4 F (36.9 C) (Oral)   Ht 5\' 5"  (1.651 m)   Wt 221 lb 12.8 oz (100.6 kg)   LMP 06/25/2017   SpO2 98%   BMI 36.91 kg/m   BP/Weight 07/16/2017 02/15/2017 11/14/2016  Systolic BP 129 126 127  Diastolic BP 85 81 82  Wt. (Lbs) 221.8 223.4 226.2  BMI 36.91 37.18 37.64      Physical Exam  Constitutional: She is oriented to person, place, and  time. She appears well-developed and well-nourished.  Cardiovascular: Normal rate, normal heart sounds and intact distal pulses.   No murmur heard. Pulmonary/Chest: Effort normal and breath sounds normal. She has no wheezes. She has no rales. She exhibits no tenderness.  Abdominal: Soft. Bowel sounds are normal. She exhibits no distension and no mass. There is no tenderness.  Musculoskeletal: Normal range of motion.  Neurological: She is alert and oriented to person, place, and time.  Skin: Skin is warm and dry.  Psychiatric: She has a normal mood and affect.     Assessment & Plan:   1. Essential hypertension Controlled Low-sodium diet - metoprolol tartrate (LOPRESSOR) 50 MG tablet; Take 1 tablet (50 mg total) by mouth 2 (two) times daily.  Dispense: 60 tablet; Refill: 6 - lisinopril-hydrochlorothiazide (PRINZIDE,ZESTORETIC) 20-12.5 MG tablet; Take 1 tablet by mouth daily.  Dispense: 30 tablet; Refill: 6 - amLODipine (NORVASC) 5 MG tablet; Take 1 tablet (5 mg total) by mouth daily.  Dispense: 30 tablet; Refill: 6 - Basic Metabolic Panel  2. History of CVA (cerebrovascular accident) Risk factor modification - clopidogrel (PLAVIX) 75 MG tablet; Take 1 tablet (75 mg total) by mouth daily.  Dispense: 30 tablet; Refill: 6 - atorvastatin (LIPITOR) 20 MG tablet; Take 1 tablet (20 mg total) by mouth daily at 6 PM.  Dispense: 30 tablet; Refill: 6  3. Muscle spasm Discussed sedating side effects of cyclobenzaprine - cyclobenzaprine (FLEXERIL) 10 MG tablet; Take 1 tablet (10 mg total) by mouth at bedtime as needed for muscle spasms.  Dispense: 30 tablet; Refill: 2  4. Bipolar disorder Continue current medication Keep appointment with psychiatry at [facility] ordered this encounter  Medications  . metoprolol tartrate (LOPRESSOR) 50 MG tablet    Sig: Take 1 tablet (50 mg total) by mouth 2 (two) times daily.    Dispense:  60 tablet    Refill:  6  . lisinopril-hydrochlorothiazide  (PRINZIDE,ZESTORETIC) 20-12.5 MG tablet    Sig: Take 1 tablet by mouth daily.    Dispense:  30 tablet    Refill:  6  . clopidogrel (PLAVIX) 75 MG tablet    Sig: Take 1 tablet (75 mg total) by mouth daily.    Dispense:  30 tablet    Refill:  6    Must have office visit for refills  . atorvastatin (LIPITOR) 20 MG tablet    Sig: Take 1 tablet (20 mg total) by mouth daily at 6 PM.    Dispense:  30 tablet    Refill:  6  . amLODipine (NORVASC) 5 MG tablet    Sig: Take 1 tablet (5 mg total) by mouth daily.    Dispense:  30 tablet    Refill:  6  . cyclobenzaprine (FLEXERIL) 10 MG tablet    Sig: Take 1 tablet (10 mg total) by mouth at bedtime as needed for muscle spasms.    Dispense:  30 tablet    Refill:  2    Follow-up: Return in about 1 month (around 08/16/2017) for complete physical exam.   Jaclyn Shaggy MD

## 2017-07-17 LAB — BASIC METABOLIC PANEL
BUN/Creatinine Ratio: 9 (ref 9–23)
BUN: 10 mg/dL (ref 6–24)
CALCIUM: 9.5 mg/dL (ref 8.7–10.2)
CHLORIDE: 99 mmol/L (ref 96–106)
CO2: 22 mmol/L (ref 20–29)
CREATININE: 1.11 mg/dL — AB (ref 0.57–1.00)
GFR calc Af Amer: 68 mL/min/{1.73_m2} (ref >59)
GFR calc non Af Amer: 59 mL/min/{1.73_m2} — ABNORMAL LOW (ref >59)
GLUCOSE: 106 mg/dL — AB (ref 65–99)
Potassium: 3.9 mmol/L (ref 3.5–5.2)
Sodium: 140 mmol/L (ref 134–144)

## 2017-07-18 ENCOUNTER — Telehealth: Payer: Self-pay

## 2017-07-18 NOTE — Telephone Encounter (Signed)
Pt was called and informed of lab results. 

## 2017-08-03 ENCOUNTER — Emergency Department (HOSPITAL_COMMUNITY): Payer: Self-pay

## 2017-08-03 ENCOUNTER — Encounter (HOSPITAL_COMMUNITY): Payer: Self-pay | Admitting: Emergency Medicine

## 2017-08-03 ENCOUNTER — Inpatient Hospital Stay (HOSPITAL_COMMUNITY)
Admission: EM | Admit: 2017-08-03 | Discharge: 2017-08-05 | DRG: 065 | Disposition: A | Discharge status: Home / Self Care | Payer: Self-pay | Attending: Internal Medicine | Admitting: Internal Medicine

## 2017-08-03 DIAGNOSIS — Z8673 Personal history of transient ischemic attack (TIA), and cerebral infarction without residual deficits: Secondary | ICD-10-CM

## 2017-08-03 DIAGNOSIS — I129 Hypertensive chronic kidney disease with stage 1 through stage 4 chronic kidney disease, or unspecified chronic kidney disease: Secondary | ICD-10-CM | POA: Diagnosis present

## 2017-08-03 DIAGNOSIS — N1832 Chronic kidney disease, stage 3b: Secondary | ICD-10-CM | POA: Diagnosis present

## 2017-08-03 DIAGNOSIS — H5461 Unqualified visual loss, right eye, normal vision left eye: Secondary | ICD-10-CM | POA: Diagnosis present

## 2017-08-03 DIAGNOSIS — Z79899 Other long term (current) drug therapy: Secondary | ICD-10-CM

## 2017-08-03 DIAGNOSIS — I69398 Other sequelae of cerebral infarction: Secondary | ICD-10-CM

## 2017-08-03 DIAGNOSIS — E669 Obesity, unspecified: Secondary | ICD-10-CM | POA: Diagnosis present

## 2017-08-03 DIAGNOSIS — D72829 Elevated white blood cell count, unspecified: Secondary | ICD-10-CM | POA: Diagnosis present

## 2017-08-03 DIAGNOSIS — I739 Peripheral vascular disease, unspecified: Secondary | ICD-10-CM | POA: Diagnosis present

## 2017-08-03 DIAGNOSIS — F1910 Other psychoactive substance abuse, uncomplicated: Secondary | ICD-10-CM | POA: Diagnosis present

## 2017-08-03 DIAGNOSIS — F329 Major depressive disorder, single episode, unspecified: Secondary | ICD-10-CM | POA: Diagnosis present

## 2017-08-03 DIAGNOSIS — N183 Chronic kidney disease, stage 3 unspecified: Secondary | ICD-10-CM | POA: Diagnosis present

## 2017-08-03 DIAGNOSIS — N611 Abscess of the breast and nipple: Secondary | ICD-10-CM | POA: Diagnosis present

## 2017-08-03 DIAGNOSIS — I671 Cerebral aneurysm, nonruptured: Secondary | ICD-10-CM | POA: Diagnosis present

## 2017-08-03 DIAGNOSIS — I6529 Occlusion and stenosis of unspecified carotid artery: Secondary | ICD-10-CM | POA: Diagnosis present

## 2017-08-03 DIAGNOSIS — E785 Hyperlipidemia, unspecified: Secondary | ICD-10-CM

## 2017-08-03 DIAGNOSIS — I639 Cerebral infarction, unspecified: Secondary | ICD-10-CM

## 2017-08-03 DIAGNOSIS — E876 Hypokalemia: Secondary | ICD-10-CM | POA: Diagnosis present

## 2017-08-03 DIAGNOSIS — I638 Other cerebral infarction: Principal | ICD-10-CM | POA: Diagnosis present

## 2017-08-03 DIAGNOSIS — I63542 Cerebral infarction due to unspecified occlusion or stenosis of left cerebellar artery: Secondary | ICD-10-CM

## 2017-08-03 DIAGNOSIS — Z7902 Long term (current) use of antithrombotics/antiplatelets: Secondary | ICD-10-CM

## 2017-08-03 DIAGNOSIS — F17200 Nicotine dependence, unspecified, uncomplicated: Secondary | ICD-10-CM | POA: Diagnosis present

## 2017-08-03 DIAGNOSIS — H3411 Central retinal artery occlusion, right eye: Secondary | ICD-10-CM

## 2017-08-03 DIAGNOSIS — H547 Unspecified visual loss: Secondary | ICD-10-CM

## 2017-08-03 DIAGNOSIS — Z6835 Body mass index (BMI) 35.0-35.9, adult: Secondary | ICD-10-CM

## 2017-08-03 DIAGNOSIS — F1210 Cannabis abuse, uncomplicated: Secondary | ICD-10-CM

## 2017-08-03 DIAGNOSIS — N179 Acute kidney failure, unspecified: Secondary | ICD-10-CM | POA: Diagnosis present

## 2017-08-03 DIAGNOSIS — R29700 NIHSS score 0: Secondary | ICD-10-CM | POA: Diagnosis present

## 2017-08-03 LAB — COMPREHENSIVE METABOLIC PANEL
ALK PHOS: 94 U/L (ref 38–126)
ALT: 20 U/L (ref 14–54)
ANION GAP: 7 (ref 5–15)
AST: 18 U/L (ref 15–41)
Albumin: 3.7 g/dL (ref 3.5–5.0)
BUN: 13 mg/dL (ref 6–20)
CALCIUM: 9.2 mg/dL (ref 8.9–10.3)
CO2: 30 mmol/L (ref 22–32)
CREATININE: 1.48 mg/dL — AB (ref 0.44–1.00)
Chloride: 98 mmol/L — ABNORMAL LOW (ref 101–111)
GFR, EST AFRICAN AMERICAN: 48 mL/min — AB (ref >60)
GFR, EST NON AFRICAN AMERICAN: 41 mL/min — AB (ref >60)
Glucose, Bld: 115 mg/dL — ABNORMAL HIGH (ref 65–99)
Potassium: 3.2 mmol/L — ABNORMAL LOW (ref 3.5–5.1)
SODIUM: 135 mmol/L (ref 135–145)
Total Bilirubin: 0.3 mg/dL (ref 0.3–1.2)
Total Protein: 8 g/dL (ref 6.5–8.1)

## 2017-08-03 LAB — I-STAT TROPONIN, ED: TROPONIN I, POC: 0 ng/mL (ref 0.00–0.08)

## 2017-08-03 LAB — CBC
HCT: 37.5 % (ref 36.0–46.0)
HEMOGLOBIN: 12.5 g/dL (ref 12.0–15.0)
MCH: 25.7 pg — ABNORMAL LOW (ref 26.0–34.0)
MCHC: 33.3 g/dL (ref 30.0–36.0)
MCV: 77.2 fL — AB (ref 78.0–100.0)
PLATELETS: 411 10*3/uL — AB (ref 150–400)
RBC: 4.86 MIL/uL (ref 3.87–5.11)
RDW: 15.9 % — ABNORMAL HIGH (ref 11.5–15.5)
WBC: 12.3 10*3/uL — ABNORMAL HIGH (ref 4.0–10.5)

## 2017-08-03 MED ORDER — NICOTINE 21 MG/24HR TD PT24
21.0000 mg | MEDICATED_PATCH | Freq: Every day | TRANSDERMAL | Status: DC
  Administered 2017-08-04 – 2017-08-05 (×2): 21 mg via TRANSDERMAL
  Filled 2017-08-03 (×2): qty 1

## 2017-08-03 MED ORDER — ENOXAPARIN SODIUM 40 MG/0.4ML ~~LOC~~ SOLN
40.0000 mg | SUBCUTANEOUS | Status: DC
  Administered 2017-08-04 (×2): 40 mg via SUBCUTANEOUS
  Filled 2017-08-03 (×2): qty 0.4

## 2017-08-03 MED ORDER — ATORVASTATIN CALCIUM 10 MG PO TABS
20.0000 mg | ORAL_TABLET | Freq: Every day | ORAL | Status: DC
  Administered 2017-08-04: 20 mg via ORAL
  Filled 2017-08-03: qty 2

## 2017-08-03 MED ORDER — ACETAMINOPHEN 650 MG RE SUPP: 650.0000 mg | RECTAL | Status: DC | PRN

## 2017-08-03 MED ORDER — ACETAMINOPHEN 325 MG PO TABS
650.0000 mg | ORAL_TABLET | ORAL | Status: DC | PRN
  Administered 2017-08-04: 650 mg via ORAL
  Filled 2017-08-03: qty 2

## 2017-08-03 MED ORDER — IOPAMIDOL (ISOVUE-370) INJECTION 76%
INTRAVENOUS | Status: AC
  Filled 2017-08-03: qty 100

## 2017-08-03 MED ORDER — CYCLOBENZAPRINE HCL 10 MG PO TABS
10.0000 mg | ORAL_TABLET | Freq: Every evening | ORAL | Status: DC | PRN
  Filled 2017-08-03: qty 1

## 2017-08-03 MED ORDER — CITALOPRAM HYDROBROMIDE 10 MG PO TABS
20.0000 mg | ORAL_TABLET | Freq: Every day | ORAL | Status: DC
  Administered 2017-08-04 – 2017-08-05 (×2): 20 mg via ORAL
  Filled 2017-08-03 (×2): qty 2

## 2017-08-03 MED ORDER — TETANUS-DIPHTH-ACELL PERTUSSIS 5-2.5-18.5 LF-MCG/0.5 IM SUSP
0.5000 mL | Freq: Once | INTRAMUSCULAR | Status: AC
  Administered 2017-08-03: 0.5 mL via INTRAMUSCULAR
  Filled 2017-08-03: qty 0.5

## 2017-08-03 MED ORDER — IOPAMIDOL (ISOVUE-370) INJECTION 76%
100.0000 mL | Freq: Once | INTRAVENOUS | Status: AC | PRN
  Administered 2017-08-03: 100 mL via INTRAVENOUS

## 2017-08-03 MED ORDER — ACETAMINOPHEN 160 MG/5ML PO SOLN: 650.0000 mg | ORAL | Status: DC | PRN

## 2017-08-03 MED ORDER — CLOPIDOGREL BISULFATE 75 MG PO TABS
75.0000 mg | ORAL_TABLET | Freq: Every day | ORAL | Status: DC
  Administered 2017-08-04 – 2017-08-05 (×2): 75 mg via ORAL
  Filled 2017-08-03 (×2): qty 1

## 2017-08-03 MED ORDER — STROKE: EARLY STAGES OF RECOVERY BOOK
Freq: Once | Status: AC
  Administered 2017-08-04

## 2017-08-03 MED ORDER — LIDOCAINE-EPINEPHRINE (PF) 2 %-1:200000 IJ SOLN
10.0000 mL | Freq: Once | INTRAMUSCULAR | Status: DC
  Filled 2017-08-03: qty 20

## 2017-08-03 MED ORDER — SODIUM CHLORIDE 0.9 % IV BOLUS (SEPSIS)
1000.0000 mL | Freq: Once | INTRAVENOUS | Status: AC
  Administered 2017-08-03: 1000 mL via INTRAVENOUS

## 2017-08-03 NOTE — ED Provider Notes (Signed)
[facility]-EMERGENCY DEPT Provider Note   CSN: [id number] Arrival date & time: 08/03/17  1151     History   Chief Complaint Chief Complaint  Patient presents with  . Abscess  . Dizziness    HPI Kendra Vega is a 46 y.o. female presenting with imbalance and right breast abscess.  Patient states that 3 days ago she, she started to have imbalance when she walks. She denies feeling as if the room is spinning. She denies symptoms when she is stationary or seated. She states she has had similar symptoms in the past, last time in December when she had a CVA. She states the symptoms have been present every time she stands up for the past 3 days, however she's not currently experiencing symptoms. She denies falls. She denies numbness, weakness, tingling, vision changes, difficulty concentrating evening, slurred speech. She denies fever, chills, chest pain, shortness of breath, nausea, vomiting, abdominal pain, urinary symptoms, or abnormal bowel movements. Additionally, patient reports lesion of the right breast. She states it's been there for month, becoming more painful, warm, and larger over the past several days. She has had multiple abscesses drained from her breast in the past, and states this feels the same. She denies any drainage from the area. Last tetanus shot was more than 10 years ago (per chart review, 2007).  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    HPI  Past Medical History:  Diagnosis Date  . 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 Hypertension   . Stroke [facility](HCC)     Patient Active Problem List   Diagnosis Date Noted  . Retinal artery branch occlusion of right eye   . Stroke (cerebrum) (HCC) 10/28/2016  . History of CVA (cerebrovascular accident) 10/28/2016  . CKD (chronic kidney disease), stage III 10/28/2016  . Simple cyst of right breast 06/22/2016  . WARTS, HAND 01/19/2011  . OBESITY  01/19/2011  . GASTROESOPHAGEAL REFLUX DISEASE 08/10/2010  . Bipolar depression (HCC) 08/10/2010  . ANXIETY 06/27/2010  . TOBACCO ABUSE 06/27/2010  . VISUAL ACUITY, DECREASED, RIGHT EYE 06/07/2010  . HYPERTENSION, BENIGN ESSENTIAL 06/07/2010    History reviewed. No pertinent surgical history.  OB History    No data available       Home Medications    Prior to Admission medications   Medication Sig Start Date End Date Taking? Authorizing Provider  acetaminophen (TYLENOL) 500 MG tablet Take 1,000 mg by mouth every 6 (six) hours as needed.   Yes [provider]  amLODipine (NORVASC) 5 MG tablet Take 1 tablet (5 mg total) by mouth daily. 07/16/17  Yes Jaclyn ShaggyAmao, Enobong, MD  atorvastatin (LIPITOR) 20 MG tablet Take 1 tablet (20 mg total) by mouth daily at 6 PM. 07/16/17  Yes Amao, Enobong, MD  citalopram (CELEXA) 20 MG tablet Take 1 tablet (20 mg total) by mouth daily. 11/14/16  Yes Storm FriskWright, Patrick E, MD  clopidogrel (PLAVIX) 75 MG tablet Take 1 tablet (75 mg total) by mouth daily. 07/16/17  Yes Jaclyn ShaggyAmao, Enobong, MD  cyclobenzaprine (FLEXERIL) 10 MG tablet Take 1 tablet (10 mg total) by mouth at bedtime as needed for muscle spasms. 07/16/17  Yes Jaclyn ShaggyAmao, Enobong, MD  hydrOXYzine (ATARAX/VISTARIL) 25 MG tablet Take 1 tablet (25 mg total) by mouth 3 (three) times daily. 11/14/16  Yes Storm FriskWright, Patrick E, MD  lisinopril-hydrochlorothiazide (PRINZIDE,ZESTORETIC) 20-12.5 MG tablet Take 1 tablet by mouth daily. 07/16/17  Yes Jaclyn Shaggy, MD  lurasidone (LATUDA) 20 MG TABS tablet Take 1 tablet (20 mg total) by mouth at bedtime. 11/14/16  Yes Storm Frisk, MD  metoprolol tartrate (LOPRESSOR) 50 MG tablet Take 1 tablet (50 mg total) by mouth 2 (two) times daily. 07/16/17  Yes Jaclyn Shaggy, MD    Family History Family History  Problem Relation Age of Onset  . Diabetes Mother   . Hypertension Mother   . Hypertension Sister     Social History Social History  Substance Use Topics  . Smoking  status: Former Smoker    Packs/day: 0.50    Years: 7.00    Quit date: 10/31/2016  . Smokeless tobacco: Never Used  . Alcohol use No     Allergies   Patient has no known allergies.   Review of Systems Review of Systems  Constitutional: Negative for chills and fever.  HENT: Negative for congestion and sore throat.   Eyes: Negative for photophobia and visual disturbance.  Respiratory: Negative for cough, chest tightness and shortness of breath.   Cardiovascular: Negative for chest pain, palpitations and leg swelling.  Gastrointestinal: Negative for abdominal pain, constipation, diarrhea, nausea and vomiting.  Genitourinary: Negative for dysuria, frequency and hematuria.  Musculoskeletal: Positive for gait problem. Negative for back pain and neck pain.  Skin: Positive for wound (lesion of right breast.).  Neurological: Negative for seizures, speech difficulty, weakness, light-headedness and headaches.       Imbalance.  Hematological: Bruises/bleeds easily (on Plavix).  Psychiatric/Behavioral: Negative for confusion.     Physical Exam Updated Vital Signs BP 125/87   Pulse 72   Temp 98 F (36.7 C) (Oral)   Resp 16   SpO2 100%   Physical Exam  Constitutional: She is oriented to person, place, and time. She appears well-developed and well-nourished. No distress.  HENT:  Head: Normocephalic and atraumatic.  Right Ear: Tympanic membrane, external ear and ear canal normal.  Left Ear: Tympanic membrane, external ear and ear canal normal.  Nose: Nose normal.  Mouth/Throat: Uvula is midline, oropharynx is clear and moist and mucous membranes are normal.  Eyes: Conjunctivae are normal.  Patient with right eye blindness at baseline, normal EOM and PERRL left eye.  Neck: Normal range of motion. Neck supple.  Cardiovascular: Normal rate, regular rhythm and intact distal pulses.   Pulmonary/Chest: Effort normal and breath sounds normal. No respiratory distress. She has no wheezes.    Abdominal: Soft. Bowel sounds are normal. She exhibits no distension. There is no tenderness.  Musculoskeletal: Normal range of motion.  Moves all extremities on command. Strength intact 4. Sensation intact 4. Radial and pedal pulses equal bilaterally.  Neurological: She is alert and oriented to person, place, and time. She has normal strength. No cranial nerve deficit or sensory deficit. She displays a negative Romberg sign. Coordination and gait normal. GCS eye subscore is 4. GCS verbal subscore is 5. GCS motor subscore is 6.  Fine movement and coordination intact. Patient ambulatory without difficulty or symptoms.  Skin: Skin is warm and dry.  Fluctuant tender lesion of right breast at 11:00. No surrounding erythema or warmth. No active drainage. Lesion does not involve the areola or nipple.  Psychiatric: She has a normal mood and affect.  Nursing note and vitals reviewed.    ED Treatments / Results  Labs (all labs ordered are listed, but only abnormal results are displayed) Labs Reviewed  CBC - Abnormal; Notable for the following:       Result  Value   WBC 12.3 (*)    MCV 77.2 (*)    MCH 25.7 (*)    RDW 15.9 (*)    Platelets 411 (*)    All other components within normal limits  COMPREHENSIVE METABOLIC PANEL - Abnormal; Notable for the following:    Potassium 3.2 (*)    Chloride 98 (*)    Glucose, Bld 115 (*)    Creatinine, Ser 1.48 (*)    GFR calc non Af Amer 41 (*)    GFR calc Af Amer 48 (*)    All other components within normal limits  I-STAT TROPONIN, ED    EKG  EKG Interpretation  Date/Time:  Saturday August 03 2017 14:35:42 EDT Ventricular Rate:  69 PR Interval:    QRS Duration: 111 QT Interval:  470 QTC Calculation: 504 R Axis:   81 Text Interpretation:  Sinus rhythm Borderline prolonged QT interval No significant change since last tracing Confirmed by Richardean Canal [phone]) on 08/03/2017 3:28:01 PM       Radiology Ct Angio Head W Or Wo  Contrast  Result Date: 08/03/2017 CLINICAL DATA:  Difficulty walking beginning 3 days ago. Sensation of room spinning. 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 EXAM: CT ANGIOGRAPHY HEAD AND NECK TECHNIQUE: Multidetector CT imaging of the head and neck was performed using the standard protocol during bolus administration of intravenous contrast. Multiplanar CT image reconstructions and MIPs were obtained to evaluate the vascular anatomy. Carotid stenosis measurements (when applicable) are obtained utilizing NASCET criteria, using the distal internal carotid diameter as the denominator. CONTRAST:  100 mL Isovue 370 COMPARISON:  Head MRI 10/27/2016 and MRA 10/28/2016 FINDINGS: CT HEAD FINDINGS Brain: There is a moderate-sized wedge-shaped region of low density in the posteromedial and inferior left cerebellar hemisphere consistent with acute to subacute infarct. There is no evidence of acute intracranial hemorrhage. There is no significant posterior fossa mass effect. No midline shift or extra-axial fluid collection is seen. The ventricles are normal in size. Vascular: Calcified atherosclerosis at the skullbase. No hyperdense vessel. Skull: No fracture focal osseous lesion. Sinuses: Opacified anterior ethmoid air cell on the left. Clear mastoid air cells. Orbits: Unremarkable. Review of the MIP images confirms the above findings CTA NECK FINDINGS Aortic arch: Common origin of the brachiocephalic and left common carotid arteries, a normal variant. Widely patent arch vessel origins. Right carotid system: Patent without evidence of stenosis, dissection, or significant atherosclerosis. Left carotid system: Patent without evidence of stenosis, dissection, or significant atherosclerosis. Vertebral arteries: Patent with the right being dominant. No evidence of significant stenosis or dissection on the right. Suboptimal assessment of the left V1 segment due to suboptimal bolus timing, artifact through this region, and the  vessel's small size. Skeleton: No acute osseous abnormality or suspicious osseous lesion. Other neck: No mass or lymph node enlargement. Upper chest: Unremarkable. Review of the MIP images confirms the above findings CTA HEAD FINDINGS Anterior circulation: The internal carotid arteries are patent from skullbase to carotid termini. There is mild carotid siphon atherosclerosis bilaterally without significant stenosis on the left. There is mild stenosis of the right supraclinoid ICA. A 2 mm inferiorly directed outpouching from the left supraclinoid ICA is unchanged without a vessel seen arising from it. ACAs and MCAs are patent without evidence of proximal branch occlusion or significant proximal stenosis. The right A1 segment is hypoplastic. Posterior circulation: The intracranial vertebral arteries are patent to the basilar with the right being dominant. PICA origins are patent bilaterally, although the left  PICA occludes approximately 1.5 cm beyond its origin. Dominant left AICA. Patent SCA origins bilaterally. Widely patent basilar artery. Posterior communicating arteries are not identified. The PCAs are patent with progressive proximal P2 stenosis bilaterally, moderate on the right and severe on the left. There is moderate irregular narrowing of the distal left P2 segment. No aneurysm. Venous sinuses: Patent. Anatomic variants: Hypoplastic right A1 segment. Delayed phase: No abnormal enhancement. Review of the MIP images confirms the above findings IMPRESSION: 1. Acute to subacute left cerebellar infarct. 2. 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 3. Moderate right and severe left proximal P2 stenoses. 4. Mild right supraclinoid ICA stenosis. 5. 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 6. No cervical carotid artery stenosis. 7. Widely patent and dominant right vertebral artery. Patent left vertebral artery with suboptimal assessment of the V1 segment. Electronically Signed   By: Sebastian Ache  M.D.   On: 08/03/2017 15:34   Ct Angio Neck W And/or Wo Contrast  Result Date: 08/03/2017 CLINICAL DATA:  Difficulty walking beginning 3 days ago. Sensation of room spinning. 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 EXAM: CT ANGIOGRAPHY HEAD AND NECK TECHNIQUE: Multidetector CT imaging of the head and neck was performed using the standard protocol during bolus administration of intravenous contrast. Multiplanar CT image reconstructions and MIPs were obtained to evaluate the vascular anatomy. Carotid stenosis measurements (when applicable) are obtained utilizing NASCET criteria, using the distal internal carotid diameter as the denominator. CONTRAST:  100 mL Isovue 370 COMPARISON:  Head MRI 10/27/2016 and MRA 10/28/2016 FINDINGS: CT HEAD FINDINGS Brain: There is a moderate-sized wedge-shaped region of low density in the posteromedial and inferior left cerebellar hemisphere consistent with acute to subacute infarct. There is no evidence of acute intracranial hemorrhage. There is no significant posterior fossa mass effect. No midline shift or extra-axial fluid collection is seen. The ventricles are normal in size. Vascular: Calcified atherosclerosis at the skullbase. No hyperdense vessel. Skull: No fracture focal osseous lesion. Sinuses: Opacified anterior ethmoid air cell on the left. Clear mastoid air cells. Orbits: Unremarkable. Review of the MIP images confirms the above findings CTA NECK FINDINGS Aortic arch: Common origin of the brachiocephalic and left common carotid arteries, a normal variant. Widely patent arch vessel origins. Right carotid system: Patent without evidence of stenosis, dissection, or significant atherosclerosis. Left carotid system: Patent without evidence of stenosis, dissection, or significant atherosclerosis. Vertebral arteries: Patent with the right being dominant. No evidence of significant stenosis or dissection on the right. Suboptimal assessment of the left V1 segment due to  suboptimal bolus timing, artifact through this region, and the vessel's small size. Skeleton: No acute osseous abnormality or suspicious osseous lesion. Other neck: No mass or lymph node enlargement. Upper chest: Unremarkable. Review of the MIP images confirms the above findings CTA HEAD FINDINGS Anterior circulation: The internal carotid arteries are patent from skullbase to carotid termini. There is mild carotid siphon atherosclerosis bilaterally without significant stenosis on the left. There is mild stenosis of the right supraclinoid ICA. A 2 mm inferiorly directed outpouching from the left supraclinoid ICA is unchanged without a vessel seen arising from it. ACAs and MCAs are patent without evidence of proximal branch occlusion or significant proximal stenosis. The right A1 segment is hypoplastic. Posterior circulation: The intracranial vertebral arteries are patent to the basilar with the right being dominant. PICA origins are patent bilaterally, although the left PICA occludes approximately 1.5 cm beyond its origin. Dominant left AICA. Patent SCA origins bilaterally. Widely  patent basilar artery. Posterior communicating arteries are not identified. The PCAs are patent with progressive proximal P2 stenosis bilaterally, moderate on the right and severe on the left. There is moderate irregular narrowing of the distal left P2 segment. No aneurysm. Venous sinuses: Patent. Anatomic variants: Hypoplastic right A1 segment. Delayed phase: No abnormal enhancement. Review of the MIP images confirms the above findings IMPRESSION: 1. Acute to subacute left cerebellar infarct. 2. 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 3. Moderate right and severe left proximal P2 stenoses. 4. Mild right supraclinoid ICA stenosis. 5. 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 6. No cervical carotid artery stenosis. 7. Widely patent and dominant right vertebral artery. Patent left vertebral artery with suboptimal assessment  of the V1 segment. Electronically Signed   By: Sebastian Ache M.D.   On: 08/03/2017 15:34    Procedures Procedures (including critical care time)  Medications Ordered in ED Medications  lidocaine-EPINEPHrine (XYLOCAINE W/EPI) 2 %-1:200000 (PF) injection 10 mL (not administered)  iopamidol (ISOVUE-370) 76 % injection (not administered)  Tdap (BOOSTRIX) injection 0.5 mL (0.5 mLs Intramuscular Given 08/03/17 1416)  sodium chloride 0.9 % bolus 1,000 mL (1,000 mLs Intravenous New Bag/Given 08/03/17 1359)  iopamidol (ISOVUE-370) 76 % injection 100 mL (100 mLs Intravenous Contrast Given 08/03/17 1446)     Initial Impression / Assessment and Plan / ED Course  I have reviewed the triage vital signs and the nursing notes.  Pertinent labs & imaging results that were available during my care of the patient were reviewed by me and considered in my medical decision making (see chart for details).     Pt presenting with 3 days imbalance. Significant history including multiple strokes, ICA aneurysm, and blindness due to arterial occlusion. Patient is on Plavix. Physical exam reassuring, as patient without symptoms currently, no neurologic deficits. Patient with abscess of right breast. Will order troponin, EKG, basic labs, and CTA head and neck. Case discussed with attending, and Dr. Silverio Lay evaluated the pt.   EKG shows no changes since last tracing. CMP shows slight worsening of kidney function. Troponin negative. CBC with mild leukocytosis. CTA shows acute/subacute left cerebellar infarct and occlusion of left PICA, 1.5 cm beyond the origin. ICA is stable since last image. Will consult with neurology for management. Will wait to manage left breast abscess, as patient is afebrile, without obvious cellulitis, and CVA management takes precedence.   Discussed with Dr. Wilford Corner with neurology. Patient to be admitted to hospitalist service and transferred to [facility] for evaluation by neurology. Discussed case with  hospitalist, patient admitted.  Final Clinical Impressions(s) / ED Diagnoses   Final diagnoses:  Cerebrovascular accident (CVA), unspecified mechanism (HCC)  AKI (acute kidney injury) (HCC)  Abscess of right breast    New Prescriptions New Prescriptions   No medications on file     Alveria Apley, Cordelia Poche 08/03/17 2303    Charlynne Pander, MD 08/04/17 [phone]

## 2017-08-03 NOTE — Consult Note (Signed)
Neurology Consultation Reason for Consult: Dizziness Referring Physician: Lafe GarinGherge, C  CC: Disequilibrium  History is obtained from: Patient  HPI: Kendra Vega is a 46 y.o. female with a history of hypertension and stroke presents with unsteadiness. She states that she feels like she is walking on a boat, but has no significant vertigo. This has been going on for 2-3 days. She denies any weakness or numbness.   LKW: 9/5 tpa given?: no, out of window    ROS: A 14 point ROS was performed and is negative except as noted in the HPI.   Past Medical History:  Diagnosis Date  . 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 Hypertension   . Stroke [facility](HCC)      Family History  Problem Relation Age of Onset  . Diabetes Mother   . Hypertension Mother   . Hypertension Sister      Social History:  reports that she quit smoking about 9 months ago. She has a 3.50 pack-year smoking history. She has never used smokeless tobacco. She reports that she uses drugs, including Marijuana, about 1 time per week. She reports that she does not drink alcohol.   Exam: Current vital signs: BP 115/70   Pulse 74   Temp 97.8 F (36.6 C) (Oral)   Resp 20   SpO2 92%  Vital signs in last 24 hours: Temp:  [97.8 F (36.6 C)-98 F (36.7 C)] 97.8 F (36.6 C) (09/08 1930) Pulse Rate:  [70-82] 74 (09/08 2200) Resp:  [16-20] 20 (09/08 1930) BP: (113-138)/(67-87) 115/70 (09/08 2200) SpO2:  [92 %-100 %] 92 % (09/08 2200)   Physical Exam  Constitutional: Appears well-developed and well-nourished.  Psych: Affect appropriate to situation Eyes: No scleral injection HENT: No OP obstrucion Head: Normocephalic.  Cardiovascular: Normal rate and regular rhythm.  Respiratory: Effort normal and breath sounds normal to anterior ascultation GI: Soft.  No distension. There is no tenderness.  Skin: WDI  Neuro: Mental Status: Patient is awake, alert, oriented to person, place, month, year, and  situation. Patient is able to give a clear and coherent history. No signs of aphasia or neglect Cranial Nerves: II: Visual Fields are full. Pupils are equal, round, and reactive to light.   III,IV, VI: EOMI without ptosis or diploplia.  V: Facial sensation is symmetric to temperature VII: Facial movement is symmetric.  VIII: hearing is intact to voice X: Uvula elevates symmetrically XI: Shoulder shrug is symmetric. XII: tongue is midline without atrophy or fasciculations.  Motor: Tone is normal. Bulk is normal. 5/5 strength was present in all four extremities.  Sensory: Sensation is symmetric to light touch and temperature in the arms and legs. Deep Tendon Reflexes: 2+ and symmetric in the biceps and patellae.  Plantars: Toes are downgoing bilaterally.  Cerebellar: FNF and HKS with possible mild difficulty on the left, but subtle.   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 This could be embolic or atherosclerotic. She will need further workup. I encouraged smoking cessation.  Recommendations: 1. HgbA1c, fasting lipid panel 2. MRI of the brain without contrast 3. Frequent neuro checks 4. Echocardiogram 5. Prophylactic therapy-Antiplatelet med: PLavix 75mg  daily 6. Risk factor modification 7. Telemetry monitoring 8. PT consult, OT consult, Speech consult 9. please page stroke NP  Or  PA  Or MD  from 8am -4 pm as this patient will be followed by the stroke team at this point.   You can look them up on [email or web address]     Roland Rack, MD [facility] [phone]  If 7pm- 7am, please page neurology on call as listed in [place].

## 2017-08-03 NOTE — ED Triage Notes (Signed)
Pt reports she has an abscess on her R breast.  Pt also reports that she began having difficulty walking on Wednesday. Pt reports it feels like there is something in her head making her want to walk to the L. Has sensation of room spinning. Hx of a stroke several months ago. Denies unilateral weakness.

## 2017-08-03 NOTE — H&P (Addendum)
History and Physical    Kendra CaldronLiz D Manz ZOX:[id number] DOB: 06/22/1971 DOA: 08/03/2017  I have briefly reviewed the patient's prior medical records in [facility]  PCP: Jaclyn ShaggyAmao, Enobong, MD  Patient coming from: Home  Chief Complaint: Poor balance for few days  HPI: Kendra Vega is a 46 y.o. 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  Patient has been noticing that she has been leaning towards right when she walks, and feels like "walks on a moving boat".  She denies any lightheadedness or dizziness.  She has had no chest pain or shortness of breath.  Patient denies any fever and chills.  She tells me that she has had a diarrheal episode and fevers about 2 weeks ago, and this has resolved.  She is also complaining of a right breast abscess has been appearing and increasing over the last week.  It is now red and it hurts her.  ED Course: In the emergency room patient is afebrile, normotensive, blood work shows a creatinine of 1.48, slight leukocytosis of 12.3.  CT angiogram of the head and neck showed an acute versus subacute left cerebellar infarct, occlusion of the left pica 1.5 cm beyond its origin, and moderate right and severe left proximal P2 stenoses.  Neurology was consulted by EDP, and recommended admission to [facility] for CVA workup.  Review of Systems: As per HPI otherwise 10 point review of systems negative.   Past Medical History:  Diagnosis Date  . 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 Hypertension   . Stroke [facility](HCC)     History reviewed. No pertinent surgical history.   reports that she quit smoking about 9 months ago. She has a 3.50 pack-year smoking history. She has never used smokeless tobacco. She reports that she uses drugs, including  Marijuana, about 1 time per week. She reports that she does not drink alcohol.  No Known Allergies  Family History  Problem Relation Age of Onset  . Diabetes Mother   . Hypertension Mother   . Hypertension Sister     Prior to Admission medications   Medication Sig Start Date End Date Taking? Authorizing Provider  acetaminophen (TYLENOL) 500 MG tablet Take 1,000 mg by mouth every 6 (six) hours as needed.   Yes [provider]  amLODipine (NORVASC) 5 MG tablet Take 1 tablet (5 mg total) by mouth daily. 07/16/17  Yes Jaclyn ShaggyAmao, Enobong, MD  atorvastatin (LIPITOR) 20 MG tablet Take 1 tablet (20 mg total) by mouth daily at 6 PM. 07/16/17  Yes Amao, Enobong, MD  citalopram (CELEXA) 20 MG tablet Take 1 tablet (20 mg total) by mouth daily. 11/14/16  Yes Storm FriskWright, Patrick E, MD  clopidogrel (PLAVIX) 75 MG tablet Take 1 tablet (75 mg total) by mouth daily. 07/16/17  Yes Jaclyn ShaggyAmao, Enobong, MD  cyclobenzaprine (FLEXERIL) 10 MG tablet Take 1 tablet (10 mg total) by mouth at bedtime as needed for muscle spasms. 07/16/17  Yes Jaclyn ShaggyAmao, Enobong, MD  hydrOXYzine (ATARAX/VISTARIL) 25 MG tablet Take 1 tablet (25 mg total) by mouth 3 (three) times daily. 11/14/16  Yes Storm FriskWright, Patrick E, MD  lisinopril-hydrochlorothiazide (PRINZIDE,ZESTORETIC) 20-12.5 MG tablet Take 1 tablet by mouth daily. 07/16/17  Yes Jaclyn ShaggyAmao, Enobong, MD  lurasidone (LATUDA) 20 MG TABS tablet Take 1 tablet (20  mg total) by mouth at bedtime. 11/14/16  Yes Storm Frisk, MD  metoprolol tartrate (LOPRESSOR) 50 MG tablet Take 1 tablet (50 mg total) by mouth 2 (two) times daily. 07/16/17  Yes Jaclyn Shaggy, MD    Physical Exam: Vitals:   08/03/17 1205 08/03/17 1441  BP: 123/85 125/87  Pulse: 77 72  Resp: 16 16  Temp: 98 F (36.7 C)   TempSrc: Oral   SpO2: 95% 100%      Constitutional: NAD, calm, comfortable Eyes: PERRL, lids and conjunctivae normal ENMT: Mucous membranes are moist. Neck: normal, supple Respiratory: clear to auscultation  bilaterally, no wheezing, no crackles. Normal respiratory effort. No accessory muscle use.  Cardiovascular: Regular rate and rhythm, no murmurs / rubs / gallops. No extremity edema. 2+ pedal pulses.  Abdomen: no tenderness, no masses palpated. Bowel sounds positive.  Musculoskeletal: no clubbing / cyanosis. Normal muscle tone.  Skin: no rashes, lesions, ulcers. No induration Neurologic: CN 2-12 grossly intact. Strength 5/5 in all 4.  Psychiatric: Normal judgment and insight. Alert and oriented x 3. Normal mood.   Labs on Admission: I have personally reviewed following labs and imaging studies  CBC:  Recent Labs Lab 08/03/17 1348  WBC 12.3*  HGB 12.5  HCT 37.5  MCV 77.2*  PLT 411*   Basic Metabolic Panel:  Recent Labs Lab 08/03/17 1348  NA 135  K 3.2*  CL 98*  CO2 30  GLUCOSE 115*  BUN 13  CREATININE 1.48*  CALCIUM 9.2   GFR: CrCl cannot be calculated (Unknown ideal weight.). Liver Function Tests:  Recent Labs Lab 08/03/17 1348  AST 18  ALT 20  ALKPHOS 94  BILITOT 0.3  PROT 8.0  ALBUMIN 3.7   No results for input(s): LIPASE, AMYLASE in the last 168 hours. No results for input(s): AMMONIA in the last 168 hours. Coagulation Profile: No results for input(s): INR, PROTIME in the last 168 hours. Cardiac Enzymes: No results for input(s): CKTOTAL, CKMB, CKMBINDEX, TROPONINI in the last 168 hours. BNP (last 3 results) No results for input(s): PROBNP in the last 8760 hours. HbA1C: No results for input(s): HGBA1C in the last 72 hours. CBG: No results for input(s): GLUCAP in the last 168 hours. Lipid Profile: No results for input(s): CHOL, HDL, LDLCALC, TRIG, CHOLHDL, LDLDIRECT in the last 72 hours. Thyroid Function Tests: No results for input(s): TSH, T4TOTAL, FREET4, T3FREE, THYROIDAB in the last 72 hours. Anemia Panel: No results for input(s): VITAMINB12, FOLATE, FERRITIN, TIBC, IRON, RETICCTPCT in the last 72 hours. Urine analysis:    Component Value  Date/Time   COLORURINE YELLOW 10/27/2016 2044   APPEARANCEUR CLEAR 10/27/2016 2044   LABSPEC 1.022 10/27/2016 2044   PHURINE 6.0 10/27/2016 2044   GLUCOSEU NEGATIVE 10/27/2016 2044   HGBUR NEGATIVE 10/27/2016 2044   HGBUR negative 01/19/2011 1016   BILIRUBINUR NEGATIVE 10/27/2016 2044   KETONESUR NEGATIVE 10/27/2016 2044   PROTEINUR NEGATIVE 10/27/2016 2044   UROBILINOGEN 0.2 01/19/2011 1016   NITRITE NEGATIVE 10/27/2016 2044   LEUKOCYTESUR NEGATIVE 10/27/2016 2044     Radiological Exams on Admission: Ct Angio Head W Or Wo Contrast  Result Date: 08/03/2017 CLINICAL DATA:  Difficulty walking beginning 3 days ago. Sensation of room spinning. 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 EXAM: CT ANGIOGRAPHY HEAD AND NECK TECHNIQUE: Multidetector CT imaging of the head and neck was performed using the standard protocol during bolus administration of intravenous contrast. Multiplanar CT image reconstructions and MIPs were obtained to evaluate the vascular anatomy. Carotid stenosis measurements (  when applicable) are obtained utilizing NASCET criteria, using the distal internal carotid diameter as the denominator. CONTRAST:  100 mL Isovue 370 COMPARISON:  Head MRI 10/27/2016 and MRA 10/28/2016 FINDINGS: CT HEAD FINDINGS Brain: There is a moderate-sized wedge-shaped region of low density in the posteromedial and inferior left cerebellar hemisphere consistent with acute to subacute infarct. There is no evidence of acute intracranial hemorrhage. There is no significant posterior fossa mass effect. No midline shift or extra-axial fluid collection is seen. The ventricles are normal in size. Vascular: Calcified atherosclerosis at the skullbase. No hyperdense vessel. Skull: No fracture focal osseous lesion. Sinuses: Opacified anterior ethmoid air cell on the left. Clear mastoid air cells. Orbits: Unremarkable. Review of the MIP images confirms the above findings CTA NECK FINDINGS Aortic arch: Common origin of the  brachiocephalic and left common carotid arteries, a normal variant. Widely patent arch vessel origins. Right carotid system: Patent without evidence of stenosis, dissection, or significant atherosclerosis. Left carotid system: Patent without evidence of stenosis, dissection, or significant atherosclerosis. Vertebral arteries: Patent with the right being dominant. No evidence of significant stenosis or dissection on the right. Suboptimal assessment of the left V1 segment due to suboptimal bolus timing, artifact through this region, and the vessel's small size. Skeleton: No acute osseous abnormality or suspicious osseous lesion. Other neck: No mass or lymph node enlargement. Upper chest: Unremarkable. Review of the MIP images confirms the above findings CTA HEAD FINDINGS Anterior circulation: The internal carotid arteries are patent from skullbase to carotid termini. There is mild carotid siphon atherosclerosis bilaterally without significant stenosis on the left. There is mild stenosis of the right supraclinoid ICA. A 2 mm inferiorly directed outpouching from the left supraclinoid ICA is unchanged without a vessel seen arising from it. ACAs and MCAs are patent without evidence of proximal branch occlusion or significant proximal stenosis. The right A1 segment is hypoplastic. Posterior circulation: The intracranial vertebral arteries are patent to the basilar with the right being dominant. PICA origins are patent bilaterally, although the left PICA occludes approximately 1.5 cm beyond its origin. Dominant left AICA. Patent SCA origins bilaterally. Widely patent basilar artery. Posterior communicating arteries are not identified. The PCAs are patent with progressive proximal P2 stenosis bilaterally, moderate on the right and severe on the left. There is moderate irregular narrowing of the distal left P2 segment. No aneurysm. Venous sinuses: Patent. Anatomic variants: Hypoplastic right A1 segment. Delayed phase: No  abnormal enhancement. Review of the MIP images confirms the above findings IMPRESSION: 1. Acute to subacute left cerebellar infarct. 2. 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 3. Moderate right and severe left proximal P2 stenoses. 4. Mild right supraclinoid ICA stenosis. 5. 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 6. No cervical carotid artery stenosis. 7. Widely patent and dominant right vertebral artery. Patent left vertebral artery with suboptimal assessment of the V1 segment. Electronically Signed   By: Sebastian Ache M.D.   On: 08/03/2017 15:34   Ct Angio Neck W And/or Wo Contrast  Result Date: 08/03/2017 CLINICAL DATA:  Difficulty walking beginning 3 days ago. Sensation of room spinning. 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 EXAM: CT ANGIOGRAPHY HEAD AND NECK TECHNIQUE: Multidetector CT imaging of the head and neck was performed using the standard protocol during bolus administration of intravenous contrast. Multiplanar CT image reconstructions and MIPs were obtained to evaluate the vascular anatomy. Carotid stenosis measurements (when applicable) are obtained utilizing NASCET criteria, using the distal internal carotid diameter as the denominator.  CONTRAST:  100 mL Isovue 370 COMPARISON:  Head MRI 10/27/2016 and MRA 10/28/2016 FINDINGS: CT HEAD FINDINGS Brain: There is a moderate-sized wedge-shaped region of low density in the posteromedial and inferior left cerebellar hemisphere consistent with acute to subacute infarct. There is no evidence of acute intracranial hemorrhage. There is no significant posterior fossa mass effect. No midline shift or extra-axial fluid collection is seen. The ventricles are normal in size. Vascular: Calcified atherosclerosis at the skullbase. No hyperdense vessel. Skull: No fracture focal osseous lesion. Sinuses: Opacified anterior ethmoid air cell on the left. Clear mastoid air cells. Orbits: Unremarkable. Review of the MIP images confirms the above  findings CTA NECK FINDINGS Aortic arch: Common origin of the brachiocephalic and left common carotid arteries, a normal variant. Widely patent arch vessel origins. Right carotid system: Patent without evidence of stenosis, dissection, or significant atherosclerosis. Left carotid system: Patent without evidence of stenosis, dissection, or significant atherosclerosis. Vertebral arteries: Patent with the right being dominant. No evidence of significant stenosis or dissection on the right. Suboptimal assessment of the left V1 segment due to suboptimal bolus timing, artifact through this region, and the vessel's small size. Skeleton: No acute osseous abnormality or suspicious osseous lesion. Other neck: No mass or lymph node enlargement. Upper chest: Unremarkable. Review of the MIP images confirms the above findings CTA HEAD FINDINGS Anterior circulation: The internal carotid arteries are patent from skullbase to carotid termini. There is mild carotid siphon atherosclerosis bilaterally without significant stenosis on the left. There is mild stenosis of the right supraclinoid ICA. A 2 mm inferiorly directed outpouching from the left supraclinoid ICA is unchanged without a vessel seen arising from it. ACAs and MCAs are patent without evidence of proximal branch occlusion or significant proximal stenosis. The right A1 segment is hypoplastic. Posterior circulation: The intracranial vertebral arteries are patent to the basilar with the right being dominant. PICA origins are patent bilaterally, although the left PICA occludes approximately 1.5 cm beyond its origin. Dominant left AICA. Patent SCA origins bilaterally. Widely patent basilar artery. Posterior communicating arteries are not identified. The PCAs are patent with progressive proximal P2 stenosis bilaterally, moderate on the right and severe on the left. There is moderate irregular narrowing of the distal left P2 segment. No aneurysm. Venous sinuses: Patent. Anatomic  variants: Hypoplastic right A1 segment. Delayed phase: No abnormal enhancement. Review of the MIP images confirms the above findings IMPRESSION: 1. Acute to subacute left cerebellar infarct. 2. 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 3. Moderate right and severe left proximal P2 stenoses. 4. Mild right supraclinoid ICA stenosis. 5. 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 6. No cervical carotid artery stenosis. 7. Widely patent and dominant right vertebral artery. Patent left vertebral artery with suboptimal assessment of the V1 segment. Electronically Signed   By: Sebastian Ache M.D.   On: 08/03/2017 15:34    EKG: Independently reviewed.  Sinus rhythm  Assessment/Plan Active Problems:   VISUAL ACUITY, DECREASED, RIGHT EYE   History of CVA (cerebrovascular accident)   CKD (chronic kidney disease), stage III   CVA (cerebral vascular accident) (HCC)   HTN (hypertension)   HLD (hyperlipidemia)   Breast abscess   CVA -Acute versus subacute, admit on CVA pathway, continue Plavix, neurology consulted and will see patient once she arrives to [facility] -Obtain MRI, already had CT angiogram and does not need carotids, obtain 2D echo, obtain lipid panel, hemoglobin A1c, physical therapy consult  Breast abscess -Nonemergent, she is  afebrile, this has been going on for a week, call surgery consultation in the morning, CVA workup and neurology evaluation takes priority. Hold antibiotics to increase yield for cultures  Hypertension -Hold home antihypertensives to allow permissive hypertension  Hyperlipidemia -Continue home statin, obtain lipid panel  Chronic kidney disease stage III -Creatinine appears close to baseline, repeat in the morning  Tobacco abuse -Counseled for cessation, order nicotine patch  Recurrent CVAs -Patient fairly young and with a long-standing history of CVAs, she is obese and a smoker, however may need to look into other causes/vasculitides  that can cause recurrent CVAs in a young patient.  Defer workup to neurology   DVT prophylaxis: Lovenox  Code Status: Full code  Family Communication: family in the room Disposition Plan: admit to telemetry, home vs rehab once workup complete Consults called: neurology     Admission status: inpatient    At the time of admission, it appears that the appropriate admission status for this patient is INPATIENT. This is judged to be reasonable and necessary in order to provide the required high service intensity to ensure the patient's safety given the presenting symptoms, physical exam findings, and initial radiographic and laboratory data in the context of their chronic comorbidities. Current circumstances are stroke, and it is felt to place patient at high risk for further clinical deterioration threatening life, limb, or organ. Moreover, it is my clinical judgment that the patient will require inpatient hospital care spanning beyond 2 midnights from the point of admission and that early discharge would result in unnecessary risk of decompensation and readmission or threat to life, limb or bodily function.   Pamella Pert, MD [facility] Pager [phone]  If 7PM-7AM, please contact night-coverage [email or web address] Password [facility]  08/03/2017, 4:32 PM

## 2017-08-03 NOTE — ED Notes (Signed)
[facility] here to transfer pt to [facility]. 

## 2017-08-04 ENCOUNTER — Inpatient Hospital Stay (HOSPITAL_COMMUNITY): Payer: Self-pay

## 2017-08-04 DIAGNOSIS — I63542 Cerebral infarction due to unspecified occlusion or stenosis of left cerebellar artery: Secondary | ICD-10-CM

## 2017-08-04 DIAGNOSIS — I340 Nonrheumatic mitral (valve) insufficiency: Secondary | ICD-10-CM

## 2017-08-04 DIAGNOSIS — H3411 Central retinal artery occlusion, right eye: Secondary | ICD-10-CM

## 2017-08-04 DIAGNOSIS — I159 Secondary hypertension, unspecified: Secondary | ICD-10-CM

## 2017-08-04 DIAGNOSIS — F1210 Cannabis abuse, uncomplicated: Secondary | ICD-10-CM

## 2017-08-04 DIAGNOSIS — H547 Unspecified visual loss: Secondary | ICD-10-CM

## 2017-08-04 LAB — CBC
HCT: 34.8 % — ABNORMAL LOW (ref 36.0–46.0)
HEMOGLOBIN: 11.2 g/dL — AB (ref 12.0–15.0)
MCH: 24.8 pg — AB (ref 26.0–34.0)
MCHC: 32.2 g/dL (ref 30.0–36.0)
MCV: 77.2 fL — ABNORMAL LOW (ref 78.0–100.0)
PLATELETS: 351 10*3/uL (ref 150–400)
RBC: 4.51 MIL/uL (ref 3.87–5.11)
RDW: 16 % — ABNORMAL HIGH (ref 11.5–15.5)
WBC: 10.5 10*3/uL (ref 4.0–10.5)

## 2017-08-04 LAB — RAPID URINE DRUG SCREEN, HOSP PERFORMED
Amphetamines: NOT DETECTED
BARBITURATES: NOT DETECTED
Benzodiazepines: NOT DETECTED
Cocaine: NOT DETECTED
Opiates: NOT DETECTED
TETRAHYDROCANNABINOL: POSITIVE — AB

## 2017-08-04 LAB — BASIC METABOLIC PANEL
Anion gap: 7 (ref 5–15)
BUN: 9 mg/dL (ref 6–20)
CHLORIDE: 102 mmol/L (ref 101–111)
CO2: 26 mmol/L (ref 22–32)
Calcium: 8.9 mg/dL (ref 8.9–10.3)
Creatinine, Ser: 1.3 mg/dL — ABNORMAL HIGH (ref 0.44–1.00)
GFR calc Af Amer: 56 mL/min — ABNORMAL LOW (ref >60)
GFR calc non Af Amer: 48 mL/min — ABNORMAL LOW (ref >60)
GLUCOSE: 119 mg/dL — AB (ref 65–99)
POTASSIUM: 3.3 mmol/L — AB (ref 3.5–5.1)
Sodium: 135 mmol/L (ref 135–145)

## 2017-08-04 LAB — HEMOGLOBIN A1C
Hgb A1c MFr Bld: 5.7 % — ABNORMAL HIGH (ref 4.8–5.6)
Mean Plasma Glucose: 116.89 mg/dL

## 2017-08-04 LAB — LIPID PANEL
CHOL/HDL RATIO: 4 ratio
Cholesterol: 113 mg/dL (ref 0–200)
HDL: 28 mg/dL — ABNORMAL LOW (ref >40)
LDL CALC: 50 mg/dL (ref 0–99)
Triglycerides: 176 mg/dL — ABNORMAL HIGH (ref <150)
VLDL: 35 mg/dL (ref 0–40)

## 2017-08-04 LAB — ECHOCARDIOGRAM COMPLETE
HEIGHTINCHES: 66 in
Weight: 3541.47 oz

## 2017-08-04 LAB — HIV ANTIBODY (ROUTINE TESTING W REFLEX): HIV Screen 4th Generation wRfx: NONREACTIVE

## 2017-08-04 MED ORDER — POTASSIUM CHLORIDE CRYS ER 20 MEQ PO TBCR
40.0000 meq | EXTENDED_RELEASE_TABLET | Freq: Once | ORAL | Status: AC
  Administered 2017-08-04: 40 meq via ORAL
  Filled 2017-08-04: qty 2

## 2017-08-04 MED ORDER — AMOXICILLIN-POT CLAVULANATE 875-125 MG PO TABS
1.0000 | ORAL_TABLET | Freq: Two times a day (BID) | ORAL | Status: DC
  Administered 2017-08-04 – 2017-08-05 (×3): 1 via ORAL
  Filled 2017-08-04 (×3): qty 1

## 2017-08-04 MED ORDER — POTASSIUM CHLORIDE CRYS ER 20 MEQ PO TBCR
40.0000 meq | EXTENDED_RELEASE_TABLET | Freq: Two times a day (BID) | ORAL | Status: AC
  Administered 2017-08-04 – 2017-08-05 (×2): 40 meq via ORAL
  Filled 2017-08-04 (×3): qty 2

## 2017-08-04 MED ORDER — ASPIRIN EC 325 MG PO TBEC
325.0000 mg | DELAYED_RELEASE_TABLET | Freq: Every day | ORAL | Status: DC
  Administered 2017-08-04 – 2017-08-05 (×2): 325 mg via ORAL
  Filled 2017-08-04 (×2): qty 1

## 2017-08-04 NOTE — Progress Notes (Signed)
Pt sent down for MRI CCMT made aware..Marland Kitchen

## 2017-08-04 NOTE — Evaluation (Signed)
Speech Language Pathology Evaluation Patient Details Name: Kendra CaldronLiz D Vega MRN: [id number] DOB: 1971/07/08 Today's Date: 08/04/2017 Time: 1300-1320 SLP Time Calculation (min) (ACUTE ONLY): 20 min  Problem List:  Patient Active Problem List   Diagnosis Date Noted  . CVA (cerebral vascular accident) (HCC) 08/03/2017  . HTN (hypertension) 08/03/2017  . HLD (hyperlipidemia) 08/03/2017  . Breast abscess 08/03/2017  . Retinal artery branch occlusion of right eye   . Stroke (cerebrum) (HCC) 10/28/2016  . History of CVA (cerebrovascular accident) 10/28/2016  . CKD (chronic kidney disease), stage III 10/28/2016  . Simple cyst of right breast 06/22/2016  . WARTS, HAND 01/19/2011  . OBESITY 01/19/2011  . GASTROESOPHAGEAL REFLUX DISEASE 08/10/2010  . Bipolar depression (HCC) 08/10/2010  . ANXIETY 06/27/2010  . Tobacco abuse 06/27/2010  . VISUAL ACUITY, DECREASED, RIGHT EYE 06/07/2010  . HYPERTENSION, BENIGN ESSENTIAL 06/07/2010   Past Medical History:  Past Medical History:  Diagnosis Date  . 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 Hypertension   . Stroke [facility](HCC)    Past Surgical History: History reviewed. No pertinent surgical history. 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 Patient has been noticing that she has been leaning towards right when she walks, and feels like "walks on a moving boat". She denies any lightheadedness or dizziness. She has had no chest pain or shortness of breath. Patient denies any fever and chills. She tells me that she has had a diarrheal episode and fevers about 2 weeks ago, and this has resolved. She is also complaining of a right breast abscess has been appearing and increasing over the last week. It  is now red and it hurts her.  MRI is showing subacute infarct LEFT PICA territory without hemorrhage.     Assessment / Plan / Recommendation Clinical Impression  Cognitive/linguistic and motor speech screen were completed.  Oral mechanism exam was completed and unremarkable.  The patient's speech was clear and easy to understand.  No discernible dysarthria was noted.  The patient achieved a score of 26/30 on the MOCA-B suggesting functional skills.  She was oriented x4, was able to provide logical solutions to simple problems and was able to complete a clock drawing task.  She was also able to write a simple sentence.  Given this ST follow up is not indicated.  Thank you for the consult.      SLP Assessment  SLP Recommendation/Assessment: Patient does not need any further Speech Lanaguage Pathology Services    Follow Up Recommendations  None          SLP Evaluation Cognition  Overall Cognitive Status: Within Functional Limits for tasks assessed Arousal/Alertness: Awake/alert Orientation Level: Oriented X4 Attention: Sustained Sustained Attention: Appears intact Memory: Appears intact Awareness: Appears intact Problem Solving: Appears intact Safety/Judgment: Appears intact       Comprehension  Auditory Comprehension Overall Auditory Comprehension: Appears within functional limits for tasks assessed Commands: Within Functional Limits Conversation: Complex    Expression Expression Primary Mode of Expression: Verbal Verbal Expression Overall Verbal Expression: Appears within functional limits for tasks assessed Initiation: No impairment Automatic Speech: Name;Social Response Level of Generative/Spontaneous Verbalization: Conversation Naming: No impairment Pragmatics: No impairment Non-Verbal Means of Communication: Not applicable Written Expression Dominant Hand: Right Written Expression: Within Functional  Limits   Oral / Motor  Oral Motor/Sensory Function Overall Oral  Motor/Sensory Function: Within functional limits Motor Speech Overall Motor Speech: Appears within functional limits for tasks assessed Respiration: Within functional limits Phonation: Normal Resonance: Within functional limits Articulation: Within functional limitis Intelligibility: Intelligible Motor Planning: Witnin functional limits Motor Speech Errors: Not applicable   GO                   Dimas Aguas, MA, CCC-SLP Acute Rehab SLP [phone] Fleet Contras 08/04/2017, 1:47 PM

## 2017-08-04 NOTE — Progress Notes (Signed)
  Echocardiogram 2D Echocardiogram has been performed.  Kendra Vega 08/04/2017, 11:34 AM

## 2017-08-04 NOTE — Progress Notes (Signed)
OT Cancellation Note  Patient Details Name: Kendra Vega MRN: [id number] DOB: 1971-02-16   Cancelled Treatment:    Reason Eval/Treat Not Completed: Patient at procedure or test/ unavailable. OT will continue to follow for eval as schedule allows.  Evern BioLaura J Tanai Bouler 08/04/2017, 1:25 PM

## 2017-08-04 NOTE — Consult Note (Addendum)
Reason for Consult right breast abscess Referring Physician: Dr Galen Daft is an 46 y.o. female.  HPI: 33 yof with right breast pain and tenderness for past week. She has this before twice and has been drained.  She is smoking.  No drainage. No fevers, pain at this site.  No other breast history.  No nipple discharge.  She is actually admitted on plavix for a stroke.  Past Medical History:  Diagnosis Date  . 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 Hypertension   . Stroke [facility])     History reviewed. No pertinent surgical history.  Family History  Problem Relation Age of Onset  . Diabetes Mother   . Hypertension Mother   . Hypertension Sister     Social History:  reports that she quit smoking about 9 months ago. She has a 3.50 pack-year smoking history. She has never used smokeless tobacco. She reports that she uses drugs, including Marijuana, about 1 time per week. She reports that she does not drink alcohol.  Allergies: No Known Allergies  Medications: I have reviewed the patient's current medications.  Results for orders placed or performed during the hospital encounter of 08/03/17 (from the past 48 hour(s))  CBC     Status: Abnormal   Collection Time: 08/03/17  1:48 PM  Result Value Ref Range   WBC 12.3 (H) 4.0 - 10.5 K/uL   RBC 4.86 3.87 - 5.11 MIL/uL   Hemoglobin 12.5 12.0 - 15.0 g/dL   HCT 37.5 36.0 - 46.0 %   MCV 77.2 (L) 78.0 - 100.0 fL   MCH 25.7 (L) 26.0 - 34.0 pg   MCHC 33.3 30.0 - 36.0 g/dL   RDW 15.9 (H) 11.5 - 15.5 %   Platelets 411 (H) 150 - 400 K/uL  Comprehensive metabolic panel     Status: Abnormal   Collection Time: 08/03/17  1:48 PM  Result Value Ref Range   Sodium 135 135 - 145 mmol/L   Potassium 3.2 (L) 3.5 - 5.1 mmol/L   Chloride 98 (L) 101 - 111 mmol/L   CO2 30 22 - 32 mmol/L   Glucose, Bld 115 (H) 65 - 99 mg/dL   BUN 13 6 - 20 mg/dL   Creatinine, Ser 1.48 (H) 0.44 - 1.00 mg/dL   Calcium 9.2 8.9 - 10.3 mg/dL   Total Protein  8.0 6.5 - 8.1 g/dL   Albumin 3.7 3.5 - 5.0 g/dL   AST 18 15 - 41 U/L   ALT 20 14 - 54 U/L   Alkaline Phosphatase 94 38 - 126 U/L   Total Bilirubin 0.3 0.3 - 1.2 mg/dL   GFR calc non Af Amer 41 (L) >60 mL/min   GFR calc Af Amer 48 (L) >60 mL/min    Comment: (NOTE) The eGFR has been calculated using the CKD EPI equation. This calculation has not been validated in all clinical situations. eGFR's persistently <60 mL/min signify possible Chronic Kidney Disease.    Anion gap 7 5 - 15  I-stat troponin, ED     Status: None   Collection Time: 08/03/17  1:59 PM  Result Value Ref Range   Troponin i, poc 0.00 0.00 - 0.08 ng/mL   Comment 3            Comment: Due to the release kinetics of cTnI, a negative result within the first hours of the onset of symptoms does not rule out myocardial infarction with certainty. If myocardial  infarction is still suspected, repeat the test at appropriate intervals.   Hemoglobin A1c     Status: Abnormal   Collection Time: 08/04/17  4:55 AM  Result Value Ref Range   Hgb A1c MFr Bld 5.7 (H) 4.8 - 5.6 %    Comment: (NOTE) Pre diabetes:          5.7%-6.4% Diabetes:              >6.4% Glycemic control for   <7.0% adults with diabetes    Mean Plasma Glucose 116.89 mg/dL  Lipid panel     Status: Abnormal   Collection Time: 08/04/17  4:55 AM  Result Value Ref Range   Cholesterol 113 0 - 200 mg/dL   Triglycerides 176 (H) <150 mg/dL   HDL 28 (L) >40 mg/dL   Total CHOL/HDL Ratio 4.0 RATIO   VLDL 35 0 - 40 mg/dL   LDL Cholesterol 50 0 - 99 mg/dL    Comment:        Total Cholesterol/HDL:CHD Risk Coronary Heart Disease Risk Table                     Men   Women  1/2 Average Risk   3.4   3.3  Average Risk       5.0   4.4  2 X Average Risk   9.6   7.1  3 X Average Risk  23.4   11.0        Use the calculated Patient Ratio above and the CHD Risk Table to determine the patient's CHD Risk.        ATP III CLASSIFICATION (LDL):  <100     mg/dL   Optimal   100-129  mg/dL   Near or Above                    Optimal  130-159  mg/dL   Borderline  160-189  mg/dL   High  >190     mg/dL   Very High     Ct Angio Head W Or Wo Contrast  Result Date: 08/03/2017 CLINICAL DATA:  Difficulty walking beginning 3 days ago. Sensation of room spinning. 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 EXAM: CT ANGIOGRAPHY HEAD AND NECK TECHNIQUE: Multidetector CT imaging of the head and neck was performed using the standard protocol during bolus administration of intravenous contrast. Multiplanar CT image reconstructions and MIPs were obtained to evaluate the vascular anatomy. Carotid stenosis measurements (when applicable) are obtained utilizing NASCET criteria, using the distal internal carotid diameter as the denominator. CONTRAST:  100 mL Isovue 370 COMPARISON:  Head MRI 10/27/2016 and MRA 10/28/2016 FINDINGS: CT HEAD FINDINGS Brain: There is a moderate-sized wedge-shaped region of low density in the posteromedial and inferior left cerebellar hemisphere consistent with acute to subacute infarct. There is no evidence of acute intracranial hemorrhage. There is no significant posterior fossa mass effect. No midline shift or extra-axial fluid collection is seen. The ventricles are normal in size. Vascular: Calcified atherosclerosis at the skullbase. No hyperdense vessel. Skull: No fracture focal osseous lesion. Sinuses: Opacified anterior ethmoid air cell on the left. Clear mastoid air cells. Orbits: Unremarkable. Review of the MIP images confirms the above findings CTA NECK FINDINGS Aortic arch: Common origin of the brachiocephalic and left common carotid arteries, a normal variant. Widely patent arch vessel origins. Right carotid system: Patent without evidence of stenosis, dissection, or significant atherosclerosis. Left carotid system: Patent without evidence of stenosis, dissection, or significant  atherosclerosis. Vertebral arteries: Patent with the right being dominant. No evidence  of significant stenosis or dissection on the right. Suboptimal assessment of the left V1 segment due to suboptimal bolus timing, artifact through this region, and the vessel's small size. Skeleton: No acute osseous abnormality or suspicious osseous lesion. Other neck: No mass or lymph node enlargement. Upper chest: Unremarkable. Review of the MIP images confirms the above findings CTA HEAD FINDINGS Anterior circulation: The internal carotid arteries are patent from skullbase to carotid termini. There is mild carotid siphon atherosclerosis bilaterally without significant stenosis on the left. There is mild stenosis of the right supraclinoid ICA. A 2 mm inferiorly directed outpouching from the left supraclinoid ICA is unchanged without a vessel seen arising from it. ACAs and MCAs are patent without evidence of proximal branch occlusion or significant proximal stenosis. The right A1 segment is hypoplastic. Posterior circulation: The intracranial vertebral arteries are patent to the basilar with the right being dominant. PICA origins are patent bilaterally, although the left PICA occludes approximately 1.5 cm beyond its origin. Dominant left AICA. Patent SCA origins bilaterally. Widely patent basilar artery. Posterior communicating arteries are not identified. The PCAs are patent with progressive proximal P2 stenosis bilaterally, moderate on the right and severe on the left. There is moderate irregular narrowing of the distal left P2 segment. No aneurysm. Venous sinuses: Patent. Anatomic variants: Hypoplastic right A1 segment. Delayed phase: No abnormal enhancement. Review of the MIP images confirms the above findings IMPRESSION: 1. Acute to subacute left cerebellar infarct. 2. 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 3. Moderate right and severe left proximal P2 stenoses. 4. Mild right supraclinoid ICA stenosis. 5. 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 6. No cervical carotid artery stenosis. 7.  Widely patent and dominant right vertebral artery. Patent left vertebral artery with suboptimal assessment of the V1 segment. Electronically Signed   By: Logan Bores M.D.   On: 08/03/2017 15:34   Ct Angio Neck W And/or Wo Contrast  Result Date: 08/03/2017 CLINICAL DATA:  Difficulty walking beginning 3 days ago. Sensation of room spinning. 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 EXAM: CT ANGIOGRAPHY HEAD AND NECK TECHNIQUE: Multidetector CT imaging of the head and neck was performed using the standard protocol during bolus administration of intravenous contrast. Multiplanar CT image reconstructions and MIPs were obtained to evaluate the vascular anatomy. Carotid stenosis measurements (when applicable) are obtained utilizing NASCET criteria, using the distal internal carotid diameter as the denominator. CONTRAST:  100 mL Isovue 370 COMPARISON:  Head MRI 10/27/2016 and MRA 10/28/2016 FINDINGS: CT HEAD FINDINGS Brain: There is a moderate-sized wedge-shaped region of low density in the posteromedial and inferior left cerebellar hemisphere consistent with acute to subacute infarct. There is no evidence of acute intracranial hemorrhage. There is no significant posterior fossa mass effect. No midline shift or extra-axial fluid collection is seen. The ventricles are normal in size. Vascular: Calcified atherosclerosis at the skullbase. No hyperdense vessel. Skull: No fracture focal osseous lesion. Sinuses: Opacified anterior ethmoid air cell on the left. Clear mastoid air cells. Orbits: Unremarkable. Review of the MIP images confirms the above findings CTA NECK FINDINGS Aortic arch: Common origin of the brachiocephalic and left common carotid arteries, a normal variant. Widely patent arch vessel origins. Right carotid system: Patent without evidence of stenosis, dissection, or significant atherosclerosis. Left carotid system: Patent without evidence of stenosis, dissection, or significant atherosclerosis. Vertebral  arteries: Patent with the right being dominant. No evidence of significant stenosis or  dissection on the right. Suboptimal assessment of the left V1 segment due to suboptimal bolus timing, artifact through this region, and the vessel's small size. Skeleton: No acute osseous abnormality or suspicious osseous lesion. Other neck: No mass or lymph node enlargement. Upper chest: Unremarkable. Review of the MIP images confirms the above findings CTA HEAD FINDINGS Anterior circulation: The internal carotid arteries are patent from skullbase to carotid termini. There is mild carotid siphon atherosclerosis bilaterally without significant stenosis on the left. There is mild stenosis of the right supraclinoid ICA. A 2 mm inferiorly directed outpouching from the left supraclinoid ICA is unchanged without a vessel seen arising from it. ACAs and MCAs are patent without evidence of proximal branch occlusion or significant proximal stenosis. The right A1 segment is hypoplastic. Posterior circulation: The intracranial vertebral arteries are patent to the basilar with the right being dominant. PICA origins are patent bilaterally, although the left PICA occludes approximately 1.5 cm beyond its origin. Dominant left AICA. Patent SCA origins bilaterally. Widely patent basilar artery. Posterior communicating arteries are not identified. The PCAs are patent with progressive proximal P2 stenosis bilaterally, moderate on the right and severe on the left. There is moderate irregular narrowing of the distal left P2 segment. No aneurysm. Venous sinuses: Patent. Anatomic variants: Hypoplastic right A1 segment. Delayed phase: No abnormal enhancement. Review of the MIP images confirms the above findings IMPRESSION: 1. Acute to subacute left cerebellar infarct. 2. 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 3. Moderate right and severe left proximal P2 stenoses. 4. Mild right supraclinoid ICA stenosis. 5. Unchanged 2 mm left supraclinoid  ICA aneurysm. 6. No cervical carotid artery stenosis. 7. Widely patent and dominant right vertebral artery. Patent left vertebral artery with suboptimal assessment of the V1 segment. Electronically Signed   By: Logan Bores M.D.   On: 08/03/2017 15:34   Mr Brain Wo Contrast  Result Date: 08/04/2017 CLINICAL DATA:  Stroke EXAM: MRI HEAD WITHOUT CONTRAST TECHNIQUE: Multiplanar, multiecho pulse sequences of the brain and surrounding structures were obtained without intravenous contrast. COMPARISON:  CT 08/03/2017 FINDINGS: Brain: Restricted diffusion in the left inferior cerebellum. This is well-defined and readily visualized on T1 and T2 and is most compatible with subacute infarction. No hemorrhage. No other acute infarct. No significant chronic ischemia. Ventricle size normal.  Negative for hemorrhage or mass. Vascular: Normal arterial flow voids Skull and upper cervical spine: Negative Sinuses/Orbits: Negative Other: None IMPRESSION: Subacute infarct left PICA territory without hemorrhage. Electronically Signed   By: Franchot Gallo M.D.   On: 08/04/2017 07:45    Review of Systems  Neurological: Positive for dizziness.  All other systems reviewed and are negative.  Blood pressure (!) 157/89, pulse 73, temperature 98.1 F (36.7 C), temperature source Oral, resp. rate 18, height 5' 6" (1.676 m), weight 100.4 kg (221 lb 5.5 oz), SpO2 100 %. Physical Exam Right breast with 3 cm abscess at 10 oclock near areolar border, no drainage, no nipple dc. No real erythema overyling with skin intact.  Assessment/Plan: Right breast abscess  Will put on abx for recurrent abscess, can be oral  I wrote for [place] and attempt at aspiration. This may require multiple aspirations and this can be setup with [facility] as outpatient. They will refer for surgery for failure. Discussed smoking cessation  , 08/04/2017, 2:21 PM

## 2017-08-04 NOTE — Progress Notes (Signed)
STROKE TEAM PROGRESS NOTE   HISTORY OF PRESENT ILLNESS (per record) Kendra Vega is a 46 y.o. female with a history of hypertension and stroke presents with unsteadiness. She states that she feels like she is walking on a boat, but has no significant vertigo. This has been going on for 2-3 days. She denies any weakness or numbness.   LKW: 9/5 tpa given?: no, out of window   SUBJECTIVE (INTERVAL HISTORY) Her sister is at the bedside.  Pt stated that her symptoms has been resolved. She admitted that she discontinue smoking heavily. She also admitted to primary team that she is taking marijuana whenever she got a chance. She was educated on quit smoking and marijuana.  OBJECTIVE Temp:  [97.8 F (36.6 C)-98.7 F (37.1 C)] 98.1 F (36.7 C) (09/09 0837) Pulse Rate:  [70-100] 100 (09/09 0837) Cardiac Rhythm: Normal sinus rhythm (09/09 0700) Resp:  [10-20] 18 (09/09 0837) BP: (111-154)/(67-94) 154/94 (09/09 0837) SpO2:  [92 %-100 %] 100 % (09/09 0837) Weight:  [221 lb 5.5 oz (100.4 kg)] 221 lb 5.5 oz (100.4 kg) (09/08 2257)  CBC:   Recent Labs Lab 08/03/17 1348  WBC 12.3*  HGB 12.5  HCT 37.5  MCV 77.2*  PLT 411*    Basic Metabolic Panel:   Recent Labs Lab 08/03/17 1348  NA 135  K 3.2*  CL 98*  CO2 30  GLUCOSE 115*  BUN 13  CREATININE 1.48*  CALCIUM 9.2    Lipid Panel:     Component Value Date/Time   CHOL 113 08/04/2017 0455   TRIG 176 (H) 08/04/2017 0455   HDL 28 (L) 08/04/2017 0455   CHOLHDL 4.0 08/04/2017 0455   VLDL 35 08/04/2017 0455   LDLCALC 50 08/04/2017 0455   HgbA1c:  Lab Results  Component Value Date   HGBA1C 5.7 (H) 08/04/2017   Urine Drug Screen:     Component Value Date/Time   LABOPIA NONE DETECTED 10/27/2016 2044   COCAINSCRNUR NONE DETECTED 10/27/2016 2044   LABBENZ NONE DETECTED 10/27/2016 2044   AMPHETMU NONE DETECTED 10/27/2016 2044   THCU POSITIVE (A) 10/27/2016 2044   LABBARB NONE DETECTED 10/27/2016 2044    Alcohol Level      Component Value Date/Time   ETH <5 10/27/2016 2240    IMAGING I have personally reviewed the radiological images below and agree with the radiology interpretations.  Ct Angio Head W Or Wo Contrast Ct Angio Neck W And/or Wo Contrast 08/03/2017 IMPRESSION:  1. Acute to subacute left cerebellar infarct.  2. 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  3. Moderate right and severe left proximal P2 stenoses.  4. Mild right supraclinoid ICA stenosis.  5. 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  6. No cervical carotid artery stenosis.  7. Widely patent and dominant right vertebral artery. Patent left vertebral artery with suboptimal assessment of the V1 segment.   Mr Brain Wo Contrast 08/04/2017 IMPRESSION:  Subacute infarct left PICA territory without hemorrhage.   TTE - pending   Although she wasPHYSICAL EXAM  Vitals:   08/04/17 0500 08/04/17 0700 08/04/17 0837 08/04/17 1100  BP: 121/80 133/67 (!) 154/94 (!) 157/89  Pulse: 75  100 73  Resp: Temp: 98.1 F (36.7 C) 98.4 F (36.9 C) 98.1 F (36.7 C) 98.1 F (36.7 C)  TempSrc: Oral Oral Oral Oral  SpO2: 99%  100% 100%  Weight:      Height:  Temp:  [97.8 F (36.6 C)-99 F (37.2 C)] 99 F (37.2 C) (09/09 1707) Pulse Rate:  [70-100] 85 (09/09 1707) Resp:  [10-20] 20 (09/09 1707) BP: (111-157)/(67-94) 144/89 (09/09 1707) SpO2:  [92 %-100 %] 100 % (09/09 1707) Weight:  [221 lb 5.5 oz (100.4 kg)] 221 lb 5.5 oz (100.4 kg) (09/08 2257)  General - Well nourished, well developed, in no apparent distress.  Ophthalmologic - Fundi not visualized due to noncooperation.  Cardiovascular - Regular rate and rhythm.  Mental Status -  Level of arousal and orientation to time, place, and person were intact. Language including expression, naming, repetition, comprehension was assessed and found intact. Fund of Knowledge was assessed and was intact.  Cranial Nerves II - XII - II - right eye  legally blind, with only HW. Left eye visual field full III, IV, VI - Extraocular movements intact. V - Facial sensation symmetrical VII - Facial movement intact bilaterally. VIII - Hearing & vestibular intact bilaterally. X - Palate elevates symmetrically. XI - Chin turning & shoulder shrug intact bilaterally. XII - Tongue protrusion intact.  Motor Strength - The patient's strength was normal in all extremities and pronator drift was absent.  Bulk was normal and fasciculations were absent.   Motor Tone - Muscle tone was assessed at the neck and appendages and was normal.  Reflexes - The patient's reflexes were 1+ in all extremities and she had no pathological reflexes.  Sensory - Light touch, temperature/pinprick were assessed and were symmetrical.    Coordination - The patient had normal movements in the hands and feet with no ataxia or dysmetria.  Tremor was absent.  Gait and Station - deferred..   ASSESSMENT/PLAN [name] is a 46 y.o. female with history of hypertension, a previous stroke, and blind in the right eye presenting with unsteadiness. She did not receive IV t-PA due to late presentation.  Stroke:  Left cerebellar infarct PICA territory, likely small vessel disease. Stroke risk factor include heavy smoking, hx of stroke, THC use, HLD.   Resultant  No residue deficit  CT head - Acute to subacute left cerebellar infarct.   MRI head - Subacute infarct left PICA territory without hemorrhage.   CTA H&N - Occlusion of the left PICA.  Moderate right and severe left proximal P2 stenoses.   2D Echo - pending  LDL - 50  HgbA1c - 5.7  VTE prophylaxis  - Lovenox Diet Heart Room service appropriate? Yes; Fluid consistency: Thin  clopidogrel 75 mg daily prior to admission, now on aspirin 325 mg daily and clopidogrel 75 mg daily. Continue DAPT for 3 months and then plavix alone  Patient counseled to be compliant with her antithrombotic medications  Ongoing  aggressive stroke risk factor management  Therapy recommendations: pending  Disposition:  Pending  Hx of stroke   right retinal artery occlusion in 2012 - stroke work up negative including MRI, CUS, hypercoagulable work up - Right eye can see HW, legally blind, no change over time  10/2016 right facial numbness - MRI right lateral medullary infarct - MRA h/n, CUS, TTE neg - LDL 76 A1C 5.6 - ASA changed to plavix, on lipitor  Hypertension  Stable  Permissive hypertension (OK if < 220/120) but gradually normalize in 5-7 days  Long-term BP goal normotensive  Hyperlipidemia  Home meds:  Lipitor 20 mg daily resumed in hospital  LDL 50, goal < 70  Continue statin at discharge  Tobacco abuse  Current heavy smoker  Smoking cessation  counseling provided  Nicotine patch provided  Pt is willing to quit  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   Hospital day # 1  Neurology will sign off. Please call with questions. Pt will follow up with Darrol Angel, NP, at [facility] in about 6 weeks. Thanks for the consult.  Marvel Plan, MD PhD Stroke Neurology 08/04/2017 6:48 PM    To contact Stroke Continuity provider, please refer to [email or web address]. After hours, contact General Neurology

## 2017-08-04 NOTE — Evaluation (Signed)
Occupational Therapy Evaluation and Discharge Patient Details Name: Kendra Vega MRN: [id number] DOB: 07-21-1971 Today's Date: 08/04/2017    History of Present Illness 46 year old female with left pica occlusion. This could be embolic or atherosclerotic.  has a past medical history of Blind right eye; Hypertension; and Stroke (HCC).   Clinical Impression   PTA pt independent in ADL and mobility. Pt is modified independent in both at this time. Feel patient will benefit from tub/shower chair for safety in the bathroom to prevent falls. No "boat sensation" at this time impacting balance. No further OT needs at this time, and no questions or concerns from Pt at the end of session. Thank you for the opportunity to serve this patient. OT will sign off at this time.     Follow Up Recommendations  No OT follow up    Equipment Recommendations  Tub/shower seat    Recommendations for Other Services       Precautions / Restrictions Precautions Precautions: None Restrictions Weight Bearing Restrictions: No      Mobility Bed Mobility Overal bed mobility: Independent                Transfers Overall transfer level: Modified independent Equipment used: None             General transfer comment: No difficulty with rise    Balance Overall balance assessment: Modified Independent                                         ADL either performed or assessed with clinical judgement   ADL Overall ADL's : Modified independent                                       General ADL Comments: Able to demonstrate LB dressing, toilet transfers, sink level grooming no LOB during session     Vision Baseline Vision/History:  (R eye with no vision at baseline) Patient Visual Report: No change from baseline Vision Assessment?: Yes Eye Alignment: Within Functional Limits Ocular Range of Motion: Within Functional Limits Alignment/Gaze Preference: Within  Defined Limits Tracking/Visual Pursuits: Able to track stimulus in all quads without difficulty Saccades: Within functional limits Convergence: Within functional limits Visual Fields: No apparent deficits     Perception     Praxis      Pertinent Vitals/Pain Pain Assessment: No/denies pain (premedicated prior to session)     Hand Dominance Right   Extremity/Trunk Assessment Upper Extremity Assessment Upper Extremity Assessment: Overall WFL for tasks assessed   Lower Extremity Assessment Lower Extremity Assessment: Overall WFL for tasks assessed   Cervical / Trunk Assessment Cervical / Trunk Assessment: Normal   Communication Communication Communication: No difficulties   Cognition Arousal/Alertness: Awake/alert Behavior During Therapy: WFL for tasks assessed/performed Overall Cognitive Status: Within Functional Limits for tasks assessed                                     General Comments  Discussed safety in the bathroom/shower due to high fall risk area with her having feelings like she is "on a boat" feelings not present during the session    Exercises     Shoulder Instructions      Home Living Family/patient  expects to be discharged to:: Private residence Living Arrangements: Children (son) Available Help at Discharge: Family;Available PRN/intermittently Type of Home: Apartment Home Access: Level entry     Home Layout: One level     Bathroom Shower/Tub: Tub/shower unit;Curtain   Firefighter: Standard     Home Equipment: None          Prior Functioning/Environment Level of Independence: Independent        Comments: states no balance problems prior, does not drive        OT Problem List:        OT Treatment/Interventions:      OT Goals(Current goals can be found in the care plan section) Acute Rehab OT Goals Patient Stated Goal: quit smoking OT Goal Formulation: With patient Time For Goal Achievement:  08/18/17 Potential to Achieve Goals: Good  OT Frequency:     Barriers to D/C:            Co-evaluation              AM-PAC PT "6 Clicks" Daily Activity     Outcome Measure Help from another person eating meals?: None Help from another person taking care of personal grooming?: None Help from another person toileting, which includes using toliet, bedpan, or urinal?: None Help from another person bathing (including washing, rinsing, drying)?: A Little Help from another person to put on and taking off regular upper body clothing?: None Help from another person to put on and taking off regular lower body clothing?: None 6 Click Score: 23   End of Session Equipment Utilized During Treatment: Gait belt Nurse Communication: Mobility status (started independent check off sheet)  Activity Tolerance: Patient tolerated treatment well Patient left: in bed;with call bell/phone within reach;with bed alarm set  OT Visit Diagnosis: Dizziness and giddiness (R42)                Time: 1610-9604 OT Time Calculation (min): 12 min Charges:  OT General Charges $OT Visit: 1 Visit OT Evaluation $OT Eval Low Complexity: 1 Low G-Codes:     Sherryl Manges OTR/L [phone]  Evern Bio Avon Mergenthaler 08/04/2017, 5:02 PM

## 2017-08-04 NOTE — Evaluation (Signed)
Physical Therapy Evaluation Patient Details Name: Kendra Vega MRN: [id number] DOB: September 17, 1971 Today's Date: 08/04/2017   History of Present Illness  46 year old female with left pica occlusion. This could be embolic or atherosclerotic.  has a past medical history of Blind right eye; Hypertension; and Stroke (HCC).  Clinical Impression   Patient evaluated by Physical Therapy with no further acute PT needs identified. All education has been completed and the patient has no further questions. Overall managing well; tells me she wants to quit smoking -- provided encouragement;  See below for any follow-up Physical Therapy or equipment needs. PT is signing off. Thank you for this referral.     Follow Up Recommendations No PT follow up;Other (comment) (follow up with PCP-smoking cessation)    Equipment Recommendations  None recommended by PT    Recommendations for Other Services       Precautions / Restrictions Precautions Precautions: None      Mobility  Bed Mobility Overal bed mobility: Independent                Transfers Overall transfer level: Modified independent Equipment used: None             General transfer comment: No difficulty with rise  Ambulation/Gait Ambulation/Gait assistance: Supervision;Modified independent (Device/Increase time) Ambulation Distance (Feet): 200 Feet Assistive device: None (occasional use of hallway rail) Gait Pattern/deviations: Step-through pattern Gait velocity: slightly slow   General Gait Details: Supervision initially, but quickly showing she is modified independent; occasional use of hallway rails; no loss of balance noted  Stairs Stairs: Yes Stairs assistance: Modified independent (Device/Increase time) Stair Management: Two rails;Alternating pattern;Forwards Number of Stairs: 5 General stair comments: no difficulty  Wheelchair Mobility    Modified Rankin (Stroke Patients Only)       Balance Overall balance  assessment: Modified Independent                                           Pertinent Vitals/Pain Pain Assessment: No/denies pain    Home Living Family/patient expects to be discharged to:: Private residence Living Arrangements: Children (son) Available Help at Discharge: Family;Available PRN/intermittently Type of Home: Apartment Home Access: Level entry     Home Layout: One level Home Equipment: None      Prior Function Level of Independence: Independent         Comments: states no balance problems prior, does not drive     Hand Dominance   Dominant Hand: Right    Extremity/Trunk Assessment   Upper Extremity Assessment Upper Extremity Assessment: Overall WFL for tasks assessed    Lower Extremity Assessment Lower Extremity Assessment: Overall WFL for tasks assessed       Communication   Communication: No difficulties  Cognition Arousal/Alertness: Awake/alert Behavior During Therapy: WFL for tasks assessed/performed Overall Cognitive Status: Within Functional Limits for tasks assessed                                        General Comments General comments (skin integrity, edema, etc.): We discussed risk factors and risk factor modification; quitting smoking    Exercises     Assessment/Plan    PT Assessment Patent does not need any further PT services  PT Problem List         PT Treatment  Interventions      PT Goals (Current goals can be found in the Care Plan section)  Acute Rehab PT Goals Patient Stated Goal: quit smoking PT Goal Formulation: All assessment and education complete, DC therapy    Frequency     Barriers to discharge        Co-evaluation               AM-PAC PT "6 Clicks" Daily Activity  Outcome Measure Difficulty turning over in bed (including adjusting bedclothes, sheets and blankets)?: None Difficulty moving from lying on back to sitting on the side of the bed? : None Difficulty  sitting down on and standing up from a chair with arms (e.g., wheelchair, bedside commode, etc,.)?: None Help needed moving to and from a bed to chair (including a wheelchair)?: None Help needed walking in hospital room?: None Help needed climbing 3-5 steps with a railing? : None 6 Click Score: 24    End of Session   Activity Tolerance: Patient tolerated treatment well Patient left: in bed;with call bell/phone within reach Nurse Communication: Mobility status PT Visit Diagnosis: Unsteadiness on feet (R26.81)    Time: 1610-9604 PT Time Calculation (min) (ACUTE ONLY): 12 min   Charges:   PT Evaluation $PT Eval Low Complexity: 1 Low     PT G Codes:        Van Clines, PT  Acute Rehabilitation Services Pager [phone] Office [phone]   Kendra Vega 08/04/2017, 3:00 PM

## 2017-08-04 NOTE — Progress Notes (Signed)
PROGRESS NOTE    Kendra Vega  ZOX:[id number] DOB: February 06, 1971 DOA: 08/03/2017 PCP: Jaclyn Shaggy, MD   Chief Complaint  Patient presents with  . Abscess  . Dizziness    Brief Narrative:  HPI on 08/03/2017 by Dr. Pamella Pert Kendra Vega is a 46 y.o. 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  Patient has been noticing that she has been leaning towards right when she walks, and feels like "walks on a moving boat".  She denies any lightheadedness or dizziness.  She has had no chest pain or shortness of breath.  Patient denies any fever and chills.  She tells me that she has had a diarrheal episode and fevers about 2 weeks ago, and this has resolved.  She is also complaining of a right breast abscess has been appearing and increasing over the last week.  It is now red and it hurts her. Assessment & Plan   CVA, acute vs subacute -MRI showed a subacute infarct left PICA territory without hemorrhage -CTA head and neck shows acute to subacute left cerebellar infarct, occlusion of left PICA. Moderate right and severe left proximal P2 stenosis. Mild right subglenoid ICA stenosis. 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 Widely patent and dominant right vertebral artery. Patent left vertebral artery with suboptimal assessment of the one segment. No cervical carotid artery stenosis. -Do not CTA was done, will not obtain carotid Doppler -Echocardiogram shows EF of 55 6%, grade 1 diastolic dysfunction. No cardiac source for bleeding identified -LDL 50, hemoglobin A1c 5.7 -Neurology consulted and appreciated, pending further conditions -Patient states she was on aspirin, Plavix and statin however the aspirin was dropped and she was continued on Plavix with statin. She states she has  been taking this medications regularly. -Continue aspirin, Plavix, statin  Breast abscess, right -Patient currently afebrile -No labs from today, will order CBC as patient had mild leukocytosis -Currently tender to touch, no drainage noted -Gen. surgery consulted and appreciated  Essential hypertension -Antihypertensives currently held to allow for permissive hypertension given possible stroke  Chronic kidney disease, stage III -Creatinine appears to be at baseline, will repeat BMP  Hypokalemia -Will replace and continue to monitor BMP  Hyperlipidemia -Lipid panel: Total cholesterol 113, HDL 28, LDL 50, triglycerides 176 -continue statin  Depression -Continue celexa  Tobacco abuse/polysubstance abuse -Patient admits to smoking approximately 1-1/2 packs of cigarettes per week along with marijuana as much as she can get it. -Will order UDS -Discussed cessation with patient -Continue nicotine patch  DVT Prophylaxis  Lovenox  Code Status: Full  Family Communication: None at bedside  Disposition Plan: Admitted. Pending neurological recommendations.  Consultants Neurology General surgery  Procedures  Echocardiogram  Antibiotics   Anti-infectives    None      Subjective:   Kendra Vega seen and examined today.  Patient denies chest pain, shortness breath, bone pain, nausea vomiting, diarrhea or constipation. Admits that she smoking and needs to stop. This complain of right breast pain.  Objective:   Vitals:   08/04/17 0500 08/04/17 0700 08/04/17 0837 08/04/17 1100  BP: 121/80 133/67 (!) 154/94 (!) 157/89  Pulse: 75  100 73  Resp: Temp: 98.1 F (36.7 C) 98.4 F (36.9 C) 98.1 F (36.7 C) 98.1 F (36.7 C)  TempSrc: Oral  Oral Oral Oral  SpO2: 99%  100% 100%  Weight:      Height:        Intake/Output Summary (Last 24 hours) at 08/04/17 1340 Last data filed at 08/04/17 1055  Gross per 24 hour  Intake              720 ml  Output              1050 ml  Net             -330 ml   Filed Weights   08/03/17 2257  Weight: 100.4 kg (221 lb 5.5 oz)    Exam  General: Well developed, well nourished, NAD, appears stated age  HEENT: NCAT, mucous membranes moist. (Has right eye blindness at baseline)  Cardiovascular: S1 S2 auscultated, no rubs, murmurs or gallops. Regular rate and rhythm.  Breast: Fluctuant lesion of the right breast at the 11:00 position, no surrounding erythema or warmth, no active drainage  Respiratory: Clear to auscultation bilaterally with equal chest rise  Abdomen: Soft, obese, nontender, nondistended, + bowel sounds  Extremities: warm dry without cyanosis clubbing or edema  Neuro: AAOx3, Has right eye blindness at baseline, nonfocal  Skin: Without rashes exudates or nodules  Psych: Normal affect and demeanor with intact judgement and insight   Data Reviewed: I have personally reviewed following labs and imaging studies  CBC:  Recent Labs Lab 08/03/17 1348  WBC 12.3*  HGB 12.5  HCT 37.5  MCV 77.2*  PLT 411*   Basic Metabolic Panel:  Recent Labs Lab 08/03/17 1348  NA 135  K 3.2*  CL 98*  CO2 30  GLUCOSE 115*  BUN 13  CREATININE 1.48*  CALCIUM 9.2   GFR: Estimated Creatinine Clearance: 56.2 mL/min (A) (by C-G formula based on SCr of 1.48 mg/dL (H)). Liver Function Tests:  Recent Labs Lab 08/03/17 1348  AST 18  ALT 20  ALKPHOS 94  BILITOT 0.3  PROT 8.0  ALBUMIN 3.7   No results for input(s): LIPASE, AMYLASE in the last 168 hours. No results for input(s): AMMONIA in the last 168 hours. Coagulation Profile: No results for input(s): INR, PROTIME in the last 168 hours. Cardiac Enzymes: No results for input(s): CKTOTAL, CKMB, CKMBINDEX, TROPONINI in the last 168 hours. BNP (last 3 results) No results for input(s): PROBNP in the last 8760 hours. HbA1C:  Recent Labs  08/04/17 0455  HGBA1C 5.7*   CBG: No results for input(s): GLUCAP in the last 168 hours. Lipid  Profile:  Recent Labs  08/04/17 0455  CHOL 113  HDL 28*  LDLCALC 50  TRIG [id number]*  CHOLHDL 4.0   Thyroid Function Tests: No results for input(s): TSH, T4TOTAL, FREET4, T3FREE, THYROIDAB in the last 72 hours. Anemia Panel: No results for input(s): VITAMINB12, FOLATE, FERRITIN, TIBC, IRON, RETICCTPCT in the last 72 hours. Urine analysis:    Component Value Date/Time   COLORURINE YELLOW 10/27/2016 2044   APPEARANCEUR CLEAR 10/27/2016 2044   LABSPEC 1.022 10/27/2016 2044   PHURINE 6.0 10/27/2016 2044   GLUCOSEU NEGATIVE 10/27/2016 2044   HGBUR NEGATIVE 10/27/2016 2044   HGBUR negative 01/19/2011 1016   BILIRUBINUR NEGATIVE 10/27/2016 2044   KETONESUR NEGATIVE 10/27/2016 2044   PROTEINUR NEGATIVE 10/27/2016 2044   UROBILINOGEN 0.2 01/19/2011 1016   NITRITE NEGATIVE 10/27/2016 2044   LEUKOCYTESUR NEGATIVE 10/27/2016 2044   Sepsis Labs: @LABRCNTIP (procalcitonin:4,lacticidven:4)  )No results found for this or any previous visit (from the past 240 hour(s)).    Radiology  Studies: Ct Angio Head W Or Wo Contrast  Result Date: 08/03/2017 CLINICAL DATA:  Difficulty walking beginning 3 days ago. Sensation of room spinning. 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 EXAM: CT ANGIOGRAPHY HEAD AND NECK TECHNIQUE: Multidetector CT imaging of the head and neck was performed using the standard protocol during bolus administration of intravenous contrast. Multiplanar CT image reconstructions and MIPs were obtained to evaluate the vascular anatomy. Carotid stenosis measurements (when applicable) are obtained utilizing NASCET criteria, using the distal internal carotid diameter as the denominator. CONTRAST:  100 mL Isovue 370 COMPARISON:  Head MRI 10/27/2016 and MRA 10/28/2016 FINDINGS: CT HEAD FINDINGS Brain: There is a moderate-sized wedge-shaped region of low density in the posteromedial and inferior left cerebellar hemisphere consistent with acute to subacute infarct. There is no evidence of acute  intracranial hemorrhage. There is no significant posterior fossa mass effect. No midline shift or extra-axial fluid collection is seen. The ventricles are normal in size. Vascular: Calcified atherosclerosis at the skullbase. No hyperdense vessel. Skull: No fracture focal osseous lesion. Sinuses: Opacified anterior ethmoid air cell on the left. Clear mastoid air cells. Orbits: Unremarkable. Review of the MIP images confirms the above findings CTA NECK FINDINGS Aortic arch: Common origin of the brachiocephalic and left common carotid arteries, a normal variant. Widely patent arch vessel origins. Right carotid system: Patent without evidence of stenosis, dissection, or significant atherosclerosis. Left carotid system: Patent without evidence of stenosis, dissection, or significant atherosclerosis. Vertebral arteries: Patent with the right being dominant. No evidence of significant stenosis or dissection on the right. Suboptimal assessment of the left V1 segment due to suboptimal bolus timing, artifact through this region, and the vessel's small size. Skeleton: No acute osseous abnormality or suspicious osseous lesion. Other neck: No mass or lymph node enlargement. Upper chest: Unremarkable. Review of the MIP images confirms the above findings CTA HEAD FINDINGS Anterior circulation: The internal carotid arteries are patent from skullbase to carotid termini. There is mild carotid siphon atherosclerosis bilaterally without significant stenosis on the left. There is mild stenosis of the right supraclinoid ICA. A 2 mm inferiorly directed outpouching from the left supraclinoid ICA is unchanged without a vessel seen arising from it. ACAs and MCAs are patent without evidence of proximal branch occlusion or significant proximal stenosis. The right A1 segment is hypoplastic. Posterior circulation: The intracranial vertebral arteries are patent to the basilar with the right being dominant. PICA origins are patent bilaterally,  although the left PICA occludes approximately 1.5 cm beyond its origin. Dominant left AICA. Patent SCA origins bilaterally. Widely patent basilar artery. Posterior communicating arteries are not identified. The PCAs are patent with progressive proximal P2 stenosis bilaterally, moderate on the right and severe on the left. There is moderate irregular narrowing of the distal left P2 segment. No aneurysm. Venous sinuses: Patent. Anatomic variants: Hypoplastic right A1 segment. Delayed phase: No abnormal enhancement. Review of the MIP images confirms the above findings IMPRESSION: 1. Acute to subacute left cerebellar infarct. 2. 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 3. Moderate right and severe left proximal P2 stenoses. 4. Mild right supraclinoid ICA stenosis. 5. 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 6. No cervical carotid artery stenosis. 7. Widely patent and dominant right vertebral artery. Patent left vertebral artery with suboptimal assessment of the V1 segment. Electronically Signed   By: Sebastian Ache M.D.   On: 08/03/2017 15:34   Ct Angio Neck W And/or Wo Contrast  Result Date: 08/03/2017 CLINICAL DATA:  Difficulty  walking beginning 3 days ago. Sensation of room spinning. 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 EXAM: CT ANGIOGRAPHY HEAD AND NECK TECHNIQUE: Multidetector CT imaging of the head and neck was performed using the standard protocol during bolus administration of intravenous contrast. Multiplanar CT image reconstructions and MIPs were obtained to evaluate the vascular anatomy. Carotid stenosis measurements (when applicable) are obtained utilizing NASCET criteria, using the distal internal carotid diameter as the denominator. CONTRAST:  100 mL Isovue 370 COMPARISON:  Head MRI 10/27/2016 and MRA 10/28/2016 FINDINGS: CT HEAD FINDINGS Brain: There is a moderate-sized wedge-shaped region of low density in the posteromedial and inferior left cerebellar hemisphere consistent with  acute to subacute infarct. There is no evidence of acute intracranial hemorrhage. There is no significant posterior fossa mass effect. No midline shift or extra-axial fluid collection is seen. The ventricles are normal in size. Vascular: Calcified atherosclerosis at the skullbase. No hyperdense vessel. Skull: No fracture focal osseous lesion. Sinuses: Opacified anterior ethmoid air cell on the left. Clear mastoid air cells. Orbits: Unremarkable. Review of the MIP images confirms the above findings CTA NECK FINDINGS Aortic arch: Common origin of the brachiocephalic and left common carotid arteries, a normal variant. Widely patent arch vessel origins. Right carotid system: Patent without evidence of stenosis, dissection, or significant atherosclerosis. Left carotid system: Patent without evidence of stenosis, dissection, or significant atherosclerosis. Vertebral arteries: Patent with the right being dominant. No evidence of significant stenosis or dissection on the right. Suboptimal assessment of the left V1 segment due to suboptimal bolus timing, artifact through this region, and the vessel's small size. Skeleton: No acute osseous abnormality or suspicious osseous lesion. Other neck: No mass or lymph node enlargement. Upper chest: Unremarkable. Review of the MIP images confirms the above findings CTA HEAD FINDINGS Anterior circulation: The internal carotid arteries are patent from skullbase to carotid termini. There is mild carotid siphon atherosclerosis bilaterally without significant stenosis on the left. There is mild stenosis of the right supraclinoid ICA. A 2 mm inferiorly directed outpouching from the left supraclinoid ICA is unchanged without a vessel seen arising from it. ACAs and MCAs are patent without evidence of proximal branch occlusion or significant proximal stenosis. The right A1 segment is hypoplastic. Posterior circulation: The intracranial vertebral arteries are patent to the basilar with the right  being dominant. PICA origins are patent bilaterally, although the left PICA occludes approximately 1.5 cm beyond its origin. Dominant left AICA. Patent SCA origins bilaterally. Widely patent basilar artery. Posterior communicating arteries are not identified. The PCAs are patent with progressive proximal P2 stenosis bilaterally, moderate on the right and severe on the left. There is moderate irregular narrowing of the distal left P2 segment. No aneurysm. Venous sinuses: Patent. Anatomic variants: Hypoplastic right A1 segment. Delayed phase: No abnormal enhancement. Review of the MIP images confirms the above findings IMPRESSION: 1. Acute to subacute left cerebellar infarct. 2. 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 3. Moderate right and severe left proximal P2 stenoses. 4. Mild right supraclinoid ICA stenosis. 5. 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 6. No cervical carotid artery stenosis. 7. Widely patent and dominant right vertebral artery. Patent left vertebral artery with suboptimal assessment of the V1 segment. Electronically Signed   By: Sebastian Ache M.D.   On: 08/03/2017 15:34   Mr Brain Wo Contrast  Result Date: 08/04/2017 CLINICAL DATA:  Stroke EXAM: MRI HEAD WITHOUT CONTRAST TECHNIQUE: Multiplanar, multiecho pulse sequences of the brain and surrounding structures were obtained without  intravenous contrast. COMPARISON:  CT 08/03/2017 FINDINGS: Brain: Restricted diffusion in the left inferior cerebellum. This is well-defined and readily visualized on T1 and T2 and is most compatible with subacute infarction. No hemorrhage. No other acute infarct. No significant chronic ischemia. Ventricle size normal.  Negative for hemorrhage or mass. Vascular: Normal arterial flow voids Skull and upper cervical spine: Negative Sinuses/Orbits: Negative Other: None IMPRESSION: Subacute infarct left PICA territory without hemorrhage. Electronically Signed   By: Marlan Palau M.D.   On: 08/04/2017  07:45     Scheduled Meds: . aspirin EC  325 mg Oral Daily  . atorvastatin  20 mg Oral q1800  . citalopram  20 mg Oral Daily  . clopidogrel  75 mg Oral Daily  . enoxaparin (LOVENOX) injection  40 mg Subcutaneous Q24H  . lidocaine-EPINEPHrine  10 mL Infiltration Once  . nicotine  21 mg Transdermal Daily   Continuous Infusions:   LOS: 1 day   Time Spent in minutes   30 minutes  Madlynn Lundeen D.O. on 08/04/2017 at 1:40 PM  Between 7am to 7pm - Pager - [phone]  After 7pm go to [email or web address] - password [email or web address]  And look for the night coverage person covering for me after hours  [facility] Office  [phone]

## 2017-08-05 ENCOUNTER — Other Ambulatory Visit: Payer: Self-pay | Admitting: Obstetrics and Gynecology

## 2017-08-05 DIAGNOSIS — F1210 Cannabis abuse, uncomplicated: Secondary | ICD-10-CM

## 2017-08-05 DIAGNOSIS — N611 Abscess of the breast and nipple: Secondary | ICD-10-CM

## 2017-08-05 LAB — CBC
HCT: 36.3 % (ref 36.0–46.0)
Hemoglobin: 11.6 g/dL — ABNORMAL LOW (ref 12.0–15.0)
MCH: 24.8 pg — AB (ref 26.0–34.0)
MCHC: 32 g/dL (ref 30.0–36.0)
MCV: 77.7 fL — AB (ref 78.0–100.0)
PLATELETS: 365 10*3/uL (ref 150–400)
RBC: 4.67 MIL/uL (ref 3.87–5.11)
RDW: 16.1 % — AB (ref 11.5–15.5)
WBC: 9.2 10*3/uL (ref 4.0–10.5)

## 2017-08-05 LAB — BASIC METABOLIC PANEL
Anion gap: 7 (ref 5–15)
BUN: 14 mg/dL (ref 6–20)
CO2: 26 mmol/L (ref 22–32)
Calcium: 9 mg/dL (ref 8.9–10.3)
Chloride: 101 mmol/L (ref 101–111)
Creatinine, Ser: 1.24 mg/dL — ABNORMAL HIGH (ref 0.44–1.00)
GFR calc Af Amer: 59 mL/min — ABNORMAL LOW (ref >60)
GFR, EST NON AFRICAN AMERICAN: 51 mL/min — AB (ref >60)
GLUCOSE: 114 mg/dL — AB (ref 65–99)
Potassium: 3.7 mmol/L (ref 3.5–5.1)
Sodium: 134 mmol/L — ABNORMAL LOW (ref 135–145)

## 2017-08-05 LAB — PROTIME-INR
INR: 1
Prothrombin Time: 13.1 seconds (ref 11.4–15.2)

## 2017-08-05 MED ORDER — ASPIRIN 325 MG PO TBEC: 325.0000 mg | DELAYED_RELEASE_TABLET | Freq: Every day | ORAL | 0 refills | Status: DC

## 2017-08-05 MED ORDER — NICOTINE 21 MG/24HR TD PT24: 21.0000 mg | MEDICATED_PATCH | Freq: Every day | TRANSDERMAL | 0 refills | Status: DC

## 2017-08-05 MED ORDER — AMOXICILLIN-POT CLAVULANATE 875-125 MG PO TABS: 1.0000 | ORAL_TABLET | Freq: Two times a day (BID) | ORAL | 0 refills | Status: DC

## 2017-08-05 MED FILL — NICOTINE 21 MG/24HR PATCH: 21 | 28 days supply | Qty: 28 | Fill #0

## 2017-08-05 MED FILL — AMOX-CLAV 875-125 MG TABLET: 875-125 | 10 days supply | Qty: 20 | Fill #0

## 2017-08-05 NOTE — Discharge Summary (Signed)
Physician Discharge Summary  Kendra Vega:[id number] DOB: 02-Jul-1971 DOA: 08/03/2017  PCP: Jaclyn Shaggy, MD  Admit date: 08/03/2017 Discharge date: 08/05/2017  Time spent: 45 minutes  Recommendations for Outpatient Follow-up:  Patient will be discharged to home.  Patient will need to follow up with primary care provider within one week of discharge, repeat BMP. Follow up with neurology, Kendra Vega in 6 weeks.  Follow up with the breast center. Patient should continue medications as prescribed.  Patient should follow a heart healthy diet.   Discharge Diagnoses:  CVA, acute vs subacute Breast abscess, right Essential hypertension Chronic kidney disease, stage III Hypokalemia Hyperlipidemia Depression Tobacco abuse/polysubstance abuse  Discharge Condition: Stable  Diet recommendation: heart healthy  Filed Weights   08/03/17 2257  Weight: 100.4 kg (221 lb 5.5 oz)    History of present illness:  on 08/03/2017 by Dr. 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 Patient has been noticing that she has been leaning towards right when she walks, and feels like "walks on a moving boat". She denies any lightheadedness or dizziness. She has had no chest pain or shortness of breath. Patient denies any fever and chills. She tells me that she has had a diarrheal episode and fevers about 2 weeks ago, and this has resolved. She is also complaining of a right breast abscess has been appearing and increasing over the last week. It is now red and it hurts her.  Hospital Course:  CVA, acute -MRI showed a subacute infarct left PICA territory without hemorrhage -CTA head and neck shows acute to subacute left cerebellar infarct, occlusion of  left PICA. Moderate right and severe left proximal P2 stenosis. Mild right subglenoid ICA stenosis. 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 Widely patent and dominant right vertebral artery. Patent left vertebral artery with suboptimal assessment of the one segment. No cervical carotid artery stenosis. -Do not CTA was done, will not obtain carotid Doppler -Echocardiogram shows EF of 55-60%, grade 1 diastolic dysfunction. No cardiac source for bleeding identified -LDL 50, hemoglobin A1c 5.7 -Neurology consulted and appreciated -Patient states she was on aspirin, Plavix and statin however the aspirin was dropped and she was continued on Plavix with statin. She states she has been taking this medications regularly. -Discussed with Kendra Vega- recommended aspirin and plavix for 3 month, then plavix alone -Continue aspirin, Plavix, statin  Breast abscess, right -Patient currently afebrile, no leukocytosis -Currently tender to touch, no drainage noted -Gen. surgery consulted and appreciated, recommended US aspiration- to be done at the breast center -Continue antibiotics- will discharge with augmentin   Essential hypertension -Antihypertensives currently held to allow for permissive hypertension given possible stroke  Chronic kidney disease, stage III -Creatinine appears to be at baseline  Hypokalemia -resolved, repeat BMP in one week  Hyperlipidemia -Lipid panel: Total cholesterol 113, HDL 28, LDL 50, triglycerides 176 -continue statin  Depression -Continue celexa  Tobacco abuse/polysubstance abuse -Patient admits to smoking approximately 1-1/2 packs of cigarettes per week along with marijuana as much as she can get it. -UDS +THC -Discussed cessation with patient -Continue nicotine patch  Procedures: Echocardiogram  Consultations: Neurology General surgery  Discharge Exam: Vitals:   08/05/17 0558 08/05/17 0936  BP: (!) 143/97 (!) 163/88  Pulse: 91 85  Resp: 20  20  Temp: 98.6 F (37 C) 98.5 F (36.9 C)  SpO2: 100% 100%   Feeling better today. Like to go home. Denies any chest pain, shortness breath, abdominal pain, nausea or vomiting, diarrhea or constipation.   General: Well developed, well nourished, NAD, appears stated age  HEENT: NCAT, mucous membranes moist.  Cardiovascular: S1 S2 auscultated, no rubs, murmurs or gallops. Regular rate and rhythm.  Respiratory: Clear to auscultation bilaterally with equal chest rise  Breast: Right: Abscess noted at the 10 to 11:00 position in the area lobe [place], no drainage or nipple discharge.  Abdomen: Soft, obese, nontender, nondistended, + bowel sounds  Extremities: warm dry without cyanosis clubbing or edema  Neuro: AAOx3, nonfocal  Psych: appropriate and pleasant  Discharge Instructions Discharge Instructions    Ambulatory referral to Neurology    Complete by:  As directed    Follow up with stroke clinic Darrol Angel preferred, if not available, then consider Sylvie Farrier, [facility] or Lucia Gaskins whoever is available) at [facility] in about 6-8 weeks. Thanks.   Discharge instructions    Complete by:  As directed    Patient will be discharged to home.  Patient will need to follow up with primary care provider within one week of discharge, repeat BMP. Follow up with neurology, Kendra Vega in 6 weeks.  Follow up with the breast center. Patient should continue medications as prescribed.  Patient should follow a heart healthy diet.     Current Discharge Medication List    START taking these medications   Details  amoxicillin-clavulanate (AUGMENTIN) 875-125 MG tablet Take 1 tablet by mouth every 12 (twelve) hours. Qty: 20 tablet, Refills: 0    aspirin EC 325 MG EC tablet Take 1 tablet (325 mg total) by mouth daily. Qty: 30 tablet, Refills: 0    nicotine (NICODERM CQ - DOSED IN MG/24 HOURS) 21 mg/24hr patch Place 1 patch (21 mg total) onto the skin daily. Qty: 28 patch, Refills: 0      CONTINUE these  medications which have NOT CHANGED   Details  acetaminophen (TYLENOL) 500 MG tablet Take 1,000 mg by mouth every 6 (six) hours as needed.    amLODipine (NORVASC) 5 MG tablet Take 1 tablet (5 mg total) by mouth daily. Qty: 30 tablet, Refills: 6   Associated Diagnoses: Essential hypertension    atorvastatin (LIPITOR) 20 MG tablet Take 1 tablet (20 mg total) by mouth daily at 6 PM. Qty: 30 tablet, Refills: 6   Associated Diagnoses: History of CVA (cerebrovascular accident)    citalopram (CELEXA) 20 MG tablet Take 1 tablet (20 mg total) by mouth daily. Qty: 30 tablet, Refills: 6    clopidogrel (PLAVIX) 75 MG tablet Take 1 tablet (75 mg total) by mouth daily. Qty: 30 tablet, Refills: 6   Associated Diagnoses: History of CVA (cerebrovascular accident)    cyclobenzaprine (FLEXERIL) 10 MG tablet Take 1 tablet (10 mg total) by mouth at bedtime as needed for muscle spasms. Qty: 30 tablet, Refills: 2   Associated Diagnoses: Muscle spasm    hydrOXYzine (ATARAX/VISTARIL) 25 MG tablet Take 1 tablet (25 mg total) by mouth 3 (three) times daily. Qty: 90 tablet, Refills: 6    lisinopril-hydrochlorothiazide (PRINZIDE,ZESTORETIC) 20-12.5 MG tablet Take 1 tablet by mouth daily. Qty: 30 tablet, Refills: 6   Associated Diagnoses: Essential hypertension    lurasidone (LATUDA) 20 MG TABS tablet Take 1 tablet (20 mg total) by mouth at bedtime. Qty: 60 tablet, Refills: 6    metoprolol tartrate (LOPRESSOR) 50  MG tablet Take 1 tablet (50 mg total) by mouth 2 (two) times daily. Qty: 60 tablet, Refills: 6   Associated Diagnoses: Essential hypertension       No Known Allergies Follow-up Information    Nilda RiggsMartin, Nancy Carolyn, NP. Schedule an appointment as soon as possible for a visit in 6 week(s).   Specialty:  Family Medicine Contact information: [address] [place] [address] [phone]        Imaging, [facility]. Call.   Specialty:  Diagnostic  Radiology Why:  arrive at breast center at 8:15am on 9/11.  expect a call from [place] [facility] [place] regarding financials for the procedure Contact information: [address] [place] [address] [phone]        Jaclyn ShaggyAmao, Enobong, MD. Schedule an appointment as soon as possible for a visit in 1 week(s).   Specialty:  Family Medicine Why:  Hospital follow up Contact information: [address] [place] [address] [phone]            The results of significant diagnostics from this hospitalization (including imaging, microbiology, ancillary and laboratory) are listed below for reference.    Significant Diagnostic Studies: Ct Angio Head W Or Vega Contrast  Result Date: 08/03/2017 CLINICAL DATA:  Difficulty walking beginning 3 days ago. Sensation of room spinning. 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 EXAM: CT ANGIOGRAPHY HEAD AND NECK TECHNIQUE: Multidetector CT imaging of the head and neck was performed using the standard protocol during bolus administration of intravenous contrast. Multiplanar CT image reconstructions and MIPs were obtained to evaluate the vascular anatomy. Carotid stenosis measurements (when applicable) are obtained utilizing NASCET criteria, using the distal internal carotid diameter as the denominator. CONTRAST:  100 mL Isovue 370 COMPARISON:  Head MRI 10/27/2016 and MRA 10/28/2016 FINDINGS: CT HEAD FINDINGS Brain: There is a moderate-sized wedge-shaped region of low density in the posteromedial and inferior left cerebellar hemisphere consistent with acute to subacute infarct. There is no evidence of acute intracranial hemorrhage. There is no significant posterior fossa mass effect. No midline shift or extra-axial fluid collection is seen. The ventricles are normal in size. Vascular: Calcified atherosclerosis at the skullbase. No hyperdense vessel. Skull: No fracture focal osseous lesion. Sinuses: Opacified anterior ethmoid air cell  on the left. Clear mastoid air cells. Orbits: Unremarkable. Review of the MIP images confirms the above findings CTA NECK FINDINGS Aortic arch: Common origin of the brachiocephalic and left common carotid arteries, a normal variant. Widely patent arch vessel origins. Right carotid system: Patent without evidence of stenosis, dissection, or significant atherosclerosis. Left carotid system: Patent without evidence of stenosis, dissection, or significant atherosclerosis. Vertebral arteries: Patent with the right being dominant. No evidence of significant stenosis or dissection on the right. Suboptimal assessment of the left V1 segment due to suboptimal bolus timing, artifact through this region, and the vessel's small size. Skeleton: No acute osseous abnormality or suspicious osseous lesion. Other neck: No mass or lymph node enlargement. Upper chest: Unremarkable. Review of the MIP images confirms the above findings CTA HEAD FINDINGS Anterior circulation: The internal carotid arteries are patent from skullbase to carotid termini. There is mild carotid siphon atherosclerosis bilaterally without significant stenosis on the left. There is mild stenosis of the right supraclinoid ICA. A 2 mm inferiorly directed outpouching from the left supraclinoid ICA is unchanged without a vessel seen arising from it. ACAs and MCAs are patent without evidence of proximal branch occlusion or significant proximal  stenosis. The right A1 segment is hypoplastic. Posterior circulation: The intracranial vertebral arteries are patent to the basilar with the right being dominant. PICA origins are patent bilaterally, although the left PICA occludes approximately 1.5 cm beyond its origin. Dominant left AICA. Patent SCA origins bilaterally. Widely patent basilar artery. Posterior communicating arteries are not identified. The PCAs are patent with progressive proximal P2 stenosis bilaterally, moderate on the right and severe on the left. There is  moderate irregular narrowing of the distal left P2 segment. No aneurysm. Venous sinuses: Patent. Anatomic variants: Hypoplastic right A1 segment. Delayed phase: No abnormal enhancement. Review of the MIP images confirms the above findings IMPRESSION: 1. Acute to subacute left cerebellar infarct. 2. 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 3. Moderate right and severe left proximal P2 stenoses. 4. Mild right supraclinoid ICA stenosis. 5. 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 6. No cervical carotid artery stenosis. 7. Widely patent and dominant right vertebral artery. Patent left vertebral artery with suboptimal assessment of the V1 segment. Electronically Signed   By: Kendra Vega M.D.   On: 08/03/2017 15:34   Ct Angio Neck W And/or Vega Contrast  Result Date: 08/03/2017 CLINICAL DATA:  Difficulty walking beginning 3 days ago. Sensation of room spinning. 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 EXAM: CT ANGIOGRAPHY HEAD AND NECK TECHNIQUE: Multidetector CT imaging of the head and neck was performed using the standard protocol during bolus administration of intravenous contrast. Multiplanar CT image reconstructions and MIPs were obtained to evaluate the vascular anatomy. Carotid stenosis measurements (when applicable) are obtained utilizing NASCET criteria, using the distal internal carotid diameter as the denominator. CONTRAST:  100 mL Isovue 370 COMPARISON:  Head MRI 10/27/2016 and MRA 10/28/2016 FINDINGS: CT HEAD FINDINGS Brain: There is a moderate-sized wedge-shaped region of low density in the posteromedial and inferior left cerebellar hemisphere consistent with acute to subacute infarct. There is no evidence of acute intracranial hemorrhage. There is no significant posterior fossa mass effect. No midline shift or extra-axial fluid collection is seen. The ventricles are normal in size. Vascular: Calcified atherosclerosis at the skullbase. No hyperdense vessel. Skull: No fracture focal osseous  lesion. Sinuses: Opacified anterior ethmoid air cell on the left. Clear mastoid air cells. Orbits: Unremarkable. Review of the MIP images confirms the above findings CTA NECK FINDINGS Aortic arch: Common origin of the brachiocephalic and left common carotid arteries, a normal variant. Widely patent arch vessel origins. Right carotid system: Patent without evidence of stenosis, dissection, or significant atherosclerosis. Left carotid system: Patent without evidence of stenosis, dissection, or significant atherosclerosis. Vertebral arteries: Patent with the right being dominant. No evidence of significant stenosis or dissection on the right. Suboptimal assessment of the left V1 segment due to suboptimal bolus timing, artifact through this region, and the vessel's small size. Skeleton: No acute osseous abnormality or suspicious osseous lesion. Other neck: No mass or lymph node enlargement. Upper chest: Unremarkable. Review of the MIP images confirms the above findings CTA HEAD FINDINGS Anterior circulation: The internal carotid arteries are patent from skullbase to carotid termini. There is mild carotid siphon atherosclerosis bilaterally without significant stenosis on the left. There is mild stenosis of the right supraclinoid ICA. A 2 mm inferiorly directed outpouching from the left supraclinoid ICA is unchanged without a vessel seen arising from it. ACAs and MCAs are patent without evidence of proximal branch occlusion or significant proximal stenosis. The right A1 segment is hypoplastic. Posterior circulation: The intracranial vertebral arteries are patent to  the basilar with the right being dominant. PICA origins are patent bilaterally, although the left PICA occludes approximately 1.5 cm beyond its origin. Dominant left AICA. Patent SCA origins bilaterally. Widely patent basilar artery. Posterior communicating arteries are not identified. The PCAs are patent with progressive proximal P2 stenosis bilaterally,  moderate on the right and severe on the left. There is moderate irregular narrowing of the distal left P2 segment. No aneurysm. Venous sinuses: Patent. Anatomic variants: Hypoplastic right A1 segment. Delayed phase: No abnormal enhancement. Review of the MIP images confirms the above findings IMPRESSION: 1. Acute to subacute left cerebellar infarct. 2. 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 3. Moderate right and severe left proximal P2 stenoses. 4. Mild right supraclinoid ICA stenosis. 5. 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 6. No cervical carotid artery stenosis. 7. Widely patent and dominant right vertebral artery. Patent left vertebral artery with suboptimal assessment of the V1 segment. Electronically Signed   By: Kendra Vega M.D.   On: 08/03/2017 15:34   Kendra Vega Contrast  Result Date: 08/04/2017 CLINICAL DATA:  Stroke EXAM: MRI HEAD WITHOUT CONTRAST TECHNIQUE: Multiplanar, multiecho pulse sequences of the brain and surrounding structures were obtained without intravenous contrast. COMPARISON:  CT 08/03/2017 FINDINGS: Brain: Restricted diffusion in the left inferior cerebellum. This is well-defined and readily visualized on T1 and T2 and is most compatible with subacute infarction. No hemorrhage. No other acute infarct. No significant chronic ischemia. Ventricle size normal.  Negative for hemorrhage or mass. Vascular: Normal arterial flow voids Skull and upper cervical spine: Negative Sinuses/Orbits: Negative Other: None IMPRESSION: Subacute infarct left PICA territory without hemorrhage. Electronically Signed   By: Kendra Vega M.D.   On: 08/04/2017 07:45    Microbiology: No results found for this or any previous visit (from the past 240 hour(s)).   Labs: Basic Metabolic Panel:  Recent Labs Lab 08/03/17 1348 08/04/17 1447 08/05/17 0354  NA 135 135 134*  K 3.2* 3.3* 3.7  CL 98* 102 101  CO2 GLUCOSE 115* 119* 114*  BUN CREATININE 1.48* 1.30*  1.24*  CALCIUM 9.2 8.9 9.0   Liver Function Tests:  Recent Labs Lab 08/03/17 1348  AST 18  ALT 20  ALKPHOS 94  BILITOT 0.3  PROT 8.0  ALBUMIN 3.7   No results for input(s): LIPASE, AMYLASE in the last 168 hours. No results for input(s): AMMONIA in the last 168 hours. CBC:  Recent Labs Lab 08/03/17 1348 08/04/17 1447 08/05/17 0354  WBC 12.3* 10.5 9.2  HGB 12.5 11.2* 11.6*  HCT 37.5 34.8* 36.3  MCV 77.2* 77.2* 77.7*  PLT 411* 351 365   Cardiac Enzymes: No results for input(s): CKTOTAL, CKMB, CKMBINDEX, TROPONINI in the last 168 hours. BNP: BNP (last 3 results) No results for input(s): BNP in the last 8760 hours.  ProBNP (last 3 results) No results for input(s): PROBNP in the last 8760 hours.  CBG: No results for input(s): GLUCAP in the last 168 hours.     SignedEdsel Petrin  [facility] 08/05/2017, 11:17 AM

## 2017-08-05 NOTE — Care Management Note (Signed)
Case Management Note  Patient Details  Name: Kendra Vega MRN: [id number] Date of Birth: Jul 07, 1971  Subjective/Objective:  Pt admitted with CVA. She is from home with son. Pt is without insurance.                   Action/Plan: Pt is active with [facility] and plans of continuing with this clinic and [facility]. Pt has transportation home today.   Expected Discharge Date:  08/05/17               Expected Discharge Plan:  Home/Self Care  In-House Referral:     Discharge planning Services     Post Acute Care Choice:    Choice offered to:     DME Arranged:    DME Agency:     HH Arranged:    HH Agency:     Status of Service:  Completed, signed off  If discussed at [employer] Length of Stay Meetings, dates discussed:    Additional Comments:  Kermit BaloKelli F Tajon Moring, RN 08/05/2017, 12:09 PM

## 2017-08-05 NOTE — Progress Notes (Signed)
D/c reviewed with patient and mother. No further questions at this time. Patient assisted to family vehicle

## 2017-08-05 NOTE — Progress Notes (Signed)
IR aware of request for breast aspiration.  Patient is going to be discharged today.  I have contacted scheduling at the breast center, but they need more information from the ordering service.  I have contacted [facility] to call the scheduling coordinator so this can be arranged for the patient as an outpatient.  Niomi Valent E

## 2017-08-05 NOTE — Progress Notes (Signed)
Patient ID: Kendra Vega, femaleEULAMAE [birth year]ENSTEINl 31, 1972, 46 y.o.   MRN: [id number]  [facility] Surgery Progress Note     Subjective: CC- right breast abscess Patient states that right breast pain slightly improved today. No active drainage. No fevers and WBC WNL. Tolerating augmentin. Hoping to go home today.  Objective: Vital signs in last 24 hours: Temp:  [97.8 F (36.6 C)-99 F (37.2 C)] 98.6 F (37 C) (09/10 0558) Pulse Rate:  [73-100] 91 (09/10 0558) Resp:  [18-20] 20 (09/10 0558) BP: (134-157)/(77-97) 143/97 (09/10 0558) SpO2:  [100 %] 100 % (09/10 0558) Last BM Date: 08/01/17  Intake/Output from previous day: 09/09 0701 - 09/10 0700 In: 1200 [P.O.:1200] Out: 600 [Urine:600] Intake/Output this shift: No intake/output data recorded.  PE: Gen:  Alert, NAD, pleasant HEENT: EOM's intact, pupils equal and round Card:  RRR, no M/G/R heard Pulm:  CTAB, no W/R/R, effort normal Abd: Soft, NT/ND, +BS, no HSM, no hernia Ext:  No erythema, edema, or tenderness BUE/BLE  Psych: A&Ox3  Skin: no rashes noted, warm and dry Right breast: tender 3cm abscess at 10 o'clock position, no erythema or drainage  Lab Results:   Recent Labs  08/04/17 1447 08/05/17 0354  WBC 10.5 9.2  HGB 11.2* 11.6*  HCT 34.8* 36.3  PLT 351 365   BMET  Recent Labs  08/04/17 1447 08/05/17 0354  NA 135 134*  K 3.3* 3.7  CL 102 101  CO2 26 26  GLUCOSE 119* 114*  BUN 9 14  CREATININE 1.30* 1.24*  CALCIUM 8.9 9.0   PT/INR No results for input(s): LABPROT, INR in the last 72 hours. CMP     Component Value Date/Time   NA 134 (L) 08/05/2017 0354   NA 140 07/16/2017 1500   K 3.7 08/05/2017 0354   CL 101 08/05/2017 0354   CO2 26 08/05/2017 0354   GLUCOSE 114 (H) 08/05/2017 0354   BUN 14 08/05/2017 0354   BUN 10 07/16/2017 1500   CREATININE 1.24 (H) 08/05/2017 0354   CREATININE 1.32 (H) 02/15/2017 1149   CALCIUM 9.0 08/05/2017 0354   PROT 8.0 08/03/2017 1348   ALBUMIN 3.7 08/03/2017  1348   AST 18 08/03/2017 1348   ALT 20 08/03/2017 1348   ALKPHOS 94 08/03/2017 1348   BILITOT 0.3 08/03/2017 1348   GFRNONAA 51 (L) 08/05/2017 0354   GFRNONAA 48 (L) 02/15/2017 1149   GFRAA 59 (L) 08/05/2017 0354   GFRAA 56 (L) 02/15/2017 1149   Lipase  No results found for: LIPASE     Studies/Results: Ct Angio Head W Or Wo Contrast  Result Date: 08/03/2017 CLINICAL DATA:  Difficulty walking beginning 3 days ago. Sensation of room spinning. 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 EXAM: CT ANGIOGRAPHY HEAD AND NECK TECHNIQUE: Multidetector CT imaging of the head and neck was performed using the standard protocol during bolus administration of intravenous contrast. Multiplanar CT image reconstructions and MIPs were obtained to evaluate the vascular anatomy. Carotid stenosis measurements (when applicable) are obtained utilizing NASCET criteria, using the distal internal carotid diameter as the denominator. CONTRAST:  100 mL Isovue 370 COMPARISON:  Head MRI 10/27/2016 and MRA 10/28/2016 FINDINGS: CT HEAD FINDINGS Brain: There is a moderate-sized wedge-shaped region of low density in the posteromedial and inferior left cerebellar hemisphere consistent with acute to subacute infarct. There is no evidence of acute intracranial hemorrhage. There is no significant posterior fossa mass effect. No midline shift or extra-axial fluid collection is seen. The ventricles are normal  in size. Vascular: Calcified atherosclerosis at the skullbase. No hyperdense vessel. Skull: No fracture focal osseous lesion. Sinuses: Opacified anterior ethmoid air cell on the left. Clear mastoid air cells. Orbits: Unremarkable. Review of the MIP images confirms the above findings CTA NECK FINDINGS Aortic arch: Common origin of the brachiocephalic and left common carotid arteries, a normal variant. Widely patent arch vessel origins. Right carotid system: Patent without evidence of stenosis, dissection, or significant atherosclerosis.  Left carotid system: Patent without evidence of stenosis, dissection, or significant atherosclerosis. Vertebral arteries: Patent with the right being dominant. No evidence of significant stenosis or dissection on the right. Suboptimal assessment of the left V1 segment due to suboptimal bolus timing, artifact through this region, and the vessel's small size. Skeleton: No acute osseous abnormality or suspicious osseous lesion. Other neck: No mass or lymph node enlargement. Upper chest: Unremarkable. Review of the MIP images confirms the above findings CTA HEAD FINDINGS Anterior circulation: The internal carotid arteries are patent from skullbase to carotid termini. There is mild carotid siphon atherosclerosis bilaterally without significant stenosis on the left. There is mild stenosis of the right supraclinoid ICA. A 2 mm inferiorly directed outpouching from the left supraclinoid ICA is unchanged without a vessel seen arising from it. ACAs and MCAs are patent without evidence of proximal branch occlusion or significant proximal stenosis. The right A1 segment is hypoplastic. Posterior circulation: The intracranial vertebral arteries are patent to the basilar with the right being dominant. PICA origins are patent bilaterally, although the left PICA occludes approximately 1.5 cm beyond its origin. Dominant left AICA. Patent SCA origins bilaterally. Widely patent basilar artery. Posterior communicating arteries are not identified. The PCAs are patent with progressive proximal P2 stenosis bilaterally, moderate on the right and severe on the left. There is moderate irregular narrowing of the distal left P2 segment. No aneurysm. Venous sinuses: Patent. Anatomic variants: Hypoplastic right A1 segment. Delayed phase: No abnormal enhancement. Review of the MIP images confirms the above findings IMPRESSION: 1. Acute to subacute left cerebellar infarct. 2. 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 3. Moderate right and  severe left proximal P2 stenoses. 4. Mild right supraclinoid ICA stenosis. 5. 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 6. No cervical carotid artery stenosis. 7. Widely patent and dominant right vertebral artery. Patent left vertebral artery with suboptimal assessment of the V1 segment. Electronically Signed   By: Sebastian AcheAllen  Grady M.D.   On: 08/03/2017 15:34   Ct Angio Neck W And/or Wo Contrast  Result Date: 08/03/2017 CLINICAL DATA:  Difficulty walking beginning 3 days ago. Sensation of room spinning. 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 EXAM: CT ANGIOGRAPHY HEAD AND NECK TECHNIQUE: Multidetector CT imaging of the head and neck was performed using the standard protocol during bolus administration of intravenous contrast. Multiplanar CT image reconstructions and MIPs were obtained to evaluate the vascular anatomy. Carotid stenosis measurements (when applicable) are obtained utilizing NASCET criteria, using the distal internal carotid diameter as the denominator. CONTRAST:  100 mL Isovue 370 COMPARISON:  Head MRI 10/27/2016 and MRA 10/28/2016 FINDINGS: CT HEAD FINDINGS Brain: There is a moderate-sized wedge-shaped region of low density in the posteromedial and inferior left cerebellar hemisphere consistent with acute to subacute infarct. There is no evidence of acute intracranial hemorrhage. There is no significant posterior fossa mass effect. No midline shift or extra-axial fluid collection is seen. The ventricles are normal in size. Vascular: Calcified atherosclerosis at the skullbase. No hyperdense vessel. Skull: No fracture focal osseous  lesion. Sinuses: Opacified anterior ethmoid air cell on the left. Clear mastoid air cells. Orbits: Unremarkable. Review of the MIP images confirms the above findings CTA NECK FINDINGS Aortic arch: Common origin of the brachiocephalic and left common carotid arteries, a normal variant. Widely patent arch vessel origins. Right carotid system: Patent without evidence of  stenosis, dissection, or significant atherosclerosis. Left carotid system: Patent without evidence of stenosis, dissection, or significant atherosclerosis. Vertebral arteries: Patent with the right being dominant. No evidence of significant stenosis or dissection on the right. Suboptimal assessment of the left V1 segment due to suboptimal bolus timing, artifact through this region, and the vessel's small size. Skeleton: No acute osseous abnormality or suspicious osseous lesion. Other neck: No mass or lymph node enlargement. Upper chest: Unremarkable. Review of the MIP images confirms the above findings CTA HEAD FINDINGS Anterior circulation: The internal carotid arteries are patent from skullbase to carotid termini. There is mild carotid siphon atherosclerosis bilaterally without significant stenosis on the left. There is mild stenosis of the right supraclinoid ICA. A 2 mm inferiorly directed outpouching from the left supraclinoid ICA is unchanged without a vessel seen arising from it. ACAs and MCAs are patent without evidence of proximal branch occlusion or significant proximal stenosis. The right A1 segment is hypoplastic. Posterior circulation: The intracranial vertebral arteries are patent to the basilar with the right being dominant. PICA origins are patent bilaterally, although the left PICA occludes approximately 1.5 cm beyond its origin. Dominant left AICA. Patent SCA origins bilaterally. Widely patent basilar artery. Posterior communicating arteries are not identified. The PCAs are patent with progressive proximal P2 stenosis bilaterally, moderate on the right and severe on the left. There is moderate irregular narrowing of the distal left P2 segment. No aneurysm. Venous sinuses: Patent. Anatomic variants: Hypoplastic right A1 segment. Delayed phase: No abnormal enhancement. Review of the MIP images confirms the above findings IMPRESSION: 1. Acute to subacute left cerebellar infarct. 2. Occlusion of the  left PICA 1.5 cm beyond its origin. 3. Moderate right and severe left proximal P2 stenoses. 4. Mild right supraclinoid ICA stenosis. 5. 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 6. No cervical carotid artery stenosis. 7. Widely patent and dominant right vertebral artery. Patent left vertebral artery with suboptimal assessment of the V1 segment. Electronically Signed   By: Sebastian Ache M.D.   On: 08/03/2017 15:34   Mr Brain Wo Contrast  Result Date: 08/04/2017 CLINICAL DATA:  Stroke EXAM: MRI HEAD WITHOUT CONTRAST TECHNIQUE: Multiplanar, multiecho pulse sequences of the brain and surrounding structures were obtained without intravenous contrast. COMPARISON:  CT 08/03/2017 FINDINGS: Brain: Restricted diffusion in the left inferior cerebellum. This is well-defined and readily visualized on T1 and T2 and is most compatible with subacute infarction. No hemorrhage. No other acute infarct. No significant chronic ischemia. Ventricle size normal.  Negative for hemorrhage or mass. Vascular: Normal arterial flow voids Skull and upper cervical spine: Negative Sinuses/Orbits: Negative Other: None IMPRESSION: Subacute infarct left PICA territory without hemorrhage. Electronically Signed   By: Marlan Palau M.D.   On: 08/04/2017 07:45    Anti-infectives: Anti-infectives    Start     Dose/Rate Route Frequency Ordered Stop   08/04/17 1430  amoxicillin-clavulanate (AUGMENTIN) 875-125 MG per tablet 1 tablet     1 tablet Oral Every 12 hours 08/04/17 1420         Assessment/Plan CVA - on plavix, aspirin, statin HTN CKD-III HLD Depression Tobacco abuse  Right breast abscess - no active drainage -  WBC 9.2, afebrile - u/s and aspiration ordered  ID - augmentin 9/9>>day#2 FEN - heart healthy VTE - lovenox Foley - none Follow up - Breast center  Plan - awaiting right breast u/s and aspiration. If patient goes home ok to do this as outpatient at Breast center. Continue augmentin.   LOS: 2 days     Franne Forts , [facility] Surgery 08/05/2017, 7:53 AM Pager: [phone] Consults: [phone] Mon-Fri 7:00 am-4:30 pm Sat-Sun 7:00 am-11:30 am

## 2017-08-06 ENCOUNTER — Encounter (HOSPITAL_COMMUNITY): Payer: Self-pay

## 2017-08-06 ENCOUNTER — Ambulatory Visit
Admission: RE | Admit: 2017-08-06 | Discharge: 2017-08-06 | Disposition: A | Discharge status: Home / Self Care | Payer: No Typology Code available for payment source | Source: Ambulatory Visit | Attending: Obstetrics and Gynecology | Admitting: Obstetrics and Gynecology

## 2017-08-06 ENCOUNTER — Ambulatory Visit (HOSPITAL_COMMUNITY)
Admission: RE | Admit: 2017-08-06 | Discharge: 2017-08-06 | Disposition: A | Discharge status: Home / Self Care | Payer: Self-pay | Source: Ambulatory Visit | Attending: Obstetrics and Gynecology | Admitting: Obstetrics and Gynecology

## 2017-08-06 ENCOUNTER — Other Ambulatory Visit: Payer: Self-pay | Admitting: Obstetrics and Gynecology

## 2017-08-06 ENCOUNTER — Inpatient Hospital Stay
Admission: RE | Admit: 2017-08-06 | Discharge: 2017-08-06 | Disposition: A | Discharge status: Home / Self Care | Payer: Self-pay | Source: Ambulatory Visit | Attending: Obstetrics and Gynecology | Admitting: Obstetrics and Gynecology

## 2017-08-06 VITALS — BP 178/107 | HR 86 | Temp 98.1°F | Ht 66.0 in | Wt 220.4 lb

## 2017-08-06 DIAGNOSIS — N611 Abscess of the breast and nipple: Secondary | ICD-10-CM

## 2017-08-06 DIAGNOSIS — N644 Mastodynia: Secondary | ICD-10-CM

## 2017-08-06 DIAGNOSIS — N6310 Unspecified lump in the right breast, unspecified quadrant: Secondary | ICD-10-CM

## 2017-08-06 DIAGNOSIS — Z01419 Encounter for gynecological examination (general) (routine) without abnormal findings: Secondary | ICD-10-CM

## 2017-08-06 HISTORY — DX: Unspecified lump in unspecified breast: N63.0

## 2017-08-06 HISTORY — PX: BREAST CYST ASPIRATION: SHX578

## 2017-08-06 NOTE — Progress Notes (Signed)
Complaints of right breast lump x 1 year that patient states has increased in size over the past week, become red and painful. Patient complained of a throbbing pain that comes and goes. Patient rates the pain at a 6 out of 10.   Pap Smear: Pap smear completed today. Last Pap smear was 01/23/2011 and normal. Per patient has a history of an abnormal Pap smear 20 years ago that cryotherapy was completed for follow up. Patient stated all Pap smears have been normal since cryotherapy. Last Pap smear result is in EPIC.  Physical exam: Breasts Breasts symmetrical. No skin abnormalities left breast. Reddened lump observed on right breast between 9-11 o'clock next to nipple. No nipple retraction bilateral breasts. No nipple discharge bilateral breasts. No lymphadenopathy. No lumps palpated left breast. Palpated a lump within the right breast between 9-11 o'clock next to nipple. Complaints of tenderness when palpated lump. [facility] for [facility] diagnostic mamm[facility] and right breast ultrasound. Appointment scheduled for Tuesday, August 06, 2017 at 0900.  Pelvic/Bimanual   Ext Genitalia No lesions, no swelling and no discharge observed on external genitalia.         Vagina Vagina pink and normal texture. No lesions and thick white discharge observed in vagina. Wet prep completed.        Cervix Cervix is present. Cervix pink and of normal texture. Thick white discharge covering cervix.    Uterus Uterus is present and palpable. Uterus in normal position and normal size.        Adnexae Bilateral ovaries present and palpable. No tenderness on palpation.          Rectovaginal No rectal exam completed today since patient had no rectal complaints. No skin abnormalities observed on exam.    Smoking History: Patient is a former smoker that quit one week ago per patient. Discussed smoking cessation with patient. Referred [facility] to the [facility] and gave resources to  free smoking cessation classes at [facility] for additional support.  Patient Navigation: Patient education provided. [facility].

## 2017-08-06 NOTE — Patient Instructions (Signed)
Explained breast self awareness with Kendra Vega. Let patient know [facility] will cover Pap smears and HPV typing every 5 years unless has a history of abnormal Pap smears. [facility] for a diagnostic mammogram and right breast ultrasound. Appointment scheduled for Tuesday, August 06, 2017 at 0900. Discussed smoking cessation with patient. Referred patient to the [facility] Q[facility] and gave resources to free smoking cessation classes at [facility] for additional support. Discussed with patient about her high blood pressure. Patient stated she has not taken her blood pressure medications this morning. Explained the importance to patient about taking her medication as prescribed and checking her blood pressure daily. Recommended patient to keep a log to track her blood pressure and to notify her PCP if continues to be elevated. Let patient know will follow up with her within the next week with results of Pap smear and wet prep by phone. Kendra Vega verbalized understanding.  Ellis Mehaffey, Kathaleen Maserhristine Poll, RN 9:13 AM

## 2017-08-07 ENCOUNTER — Other Ambulatory Visit: Payer: Self-pay | Admitting: Family Medicine

## 2017-08-07 DIAGNOSIS — Z8673 Personal history of transient ischemic attack (TIA), and cerebral infarction without residual deficits: Secondary | ICD-10-CM

## 2017-08-07 MED FILL — ATORVASTATIN 20 MG TABLET: 20 | 30 days supply | Qty: 30 | Fill #1

## 2017-08-07 MED FILL — AMLODIPINE BESYLATE 5 MG TA: 5 | 30 days supply | Qty: 30 | Fill #4

## 2017-08-07 MED FILL — hydrOXYzine HCL 25 MG TABS: 25 | 30 days supply | Qty: 90 | Fill #3

## 2017-08-07 MED FILL — LISINOPRIL-HCTZ 20-12.5 MG: 20-12.5 | 30 days supply | Qty: 30 | Fill #4

## 2017-08-08 ENCOUNTER — Other Ambulatory Visit: Payer: Self-pay | Admitting: Obstetrics and Gynecology

## 2017-08-08 LAB — CYTOLOGY - PAP
BACTERIAL VAGINITIS: POSITIVE — AB
CANDIDA VAGINITIS: NEGATIVE
DIAGNOSIS: NEGATIVE
HPV (WINDOPATH): NOT DETECTED
Trichomonas: NEGATIVE

## 2017-08-08 MED ORDER — METRONIDAZOLE 500 MG PO TABS: 500.0000 mg | ORAL_TABLET | Freq: Two times a day (BID) | ORAL | 0 refills | Status: DC

## 2017-08-08 MED FILL — metroNIDAZOLE 500 MG TABS: 500 | 7 days supply | Qty: 14 | Fill #0

## 2017-08-09 ENCOUNTER — Telehealth (HOSPITAL_COMMUNITY): Payer: Self-pay | Admitting: *Deleted

## 2017-08-09 NOTE — Telephone Encounter (Signed)
Attempted to call patient to discuss Pap smear and wet prep results. No one answered phone. Left voicemail for patient to call me back.  

## 2017-08-12 ENCOUNTER — Other Ambulatory Visit: Payer: Self-pay | Admitting: Family Medicine

## 2017-08-12 ENCOUNTER — Telehealth (HOSPITAL_COMMUNITY): Payer: Self-pay | Admitting: *Deleted

## 2017-08-12 DIAGNOSIS — Z8673 Personal history of transient ischemic attack (TIA), and cerebral infarction without residual deficits: Secondary | ICD-10-CM

## 2017-08-12 NOTE — Telephone Encounter (Signed)
Attempted to call patient to discuss Pap smear and wet prep results. No one answered phone. Left voicemail for patient to call me back.  

## 2017-08-13 ENCOUNTER — Inpatient Hospital Stay: Admission: RE | Admit: 2017-08-13 | Payer: No Typology Code available for payment source | Source: Ambulatory Visit

## 2017-08-13 LAB — AEROBIC/ANAEROBIC CULTURE W GRAM STAIN (SURGICAL/DEEP WOUND): Special Requests: NORMAL

## 2017-08-13 LAB — AEROBIC/ANAEROBIC CULTURE (SURGICAL/DEEP WOUND)

## 2017-08-14 ENCOUNTER — Ambulatory Visit
Admission: RE | Admit: 2017-08-14 | Discharge: 2017-08-14 | Disposition: A | Discharge status: Home / Self Care | Payer: No Typology Code available for payment source | Source: Ambulatory Visit | Attending: Obstetrics and Gynecology | Admitting: Obstetrics and Gynecology

## 2017-08-14 DIAGNOSIS — N611 Abscess of the breast and nipple: Secondary | ICD-10-CM

## 2017-08-14 MED FILL — CLOPIDOGREL 75 MG TABLET: 75 | 30 days supply | Qty: 30 | Fill #0

## 2017-08-23 ENCOUNTER — Encounter: Payer: Self-pay | Admitting: Family Medicine

## 2017-08-23 ENCOUNTER — Ambulatory Visit: Payer: Self-pay | Attending: Family Medicine | Admitting: Family Medicine

## 2017-08-23 VITALS — BP 139/87 | HR 77 | Temp 98.0°F | Ht 65.0 in | Wt 226.0 lb

## 2017-08-23 DIAGNOSIS — H3411 Central retinal artery occlusion, right eye: Secondary | ICD-10-CM

## 2017-08-23 DIAGNOSIS — H5461 Unqualified visual loss, right eye, normal vision left eye: Secondary | ICD-10-CM | POA: Insufficient documentation

## 2017-08-23 DIAGNOSIS — Z7982 Long term (current) use of aspirin: Secondary | ICD-10-CM | POA: Insufficient documentation

## 2017-08-23 DIAGNOSIS — I159 Secondary hypertension, unspecified: Secondary | ICD-10-CM

## 2017-08-23 DIAGNOSIS — F319 Bipolar disorder, unspecified: Secondary | ICD-10-CM

## 2017-08-23 DIAGNOSIS — Z79899 Other long term (current) drug therapy: Secondary | ICD-10-CM | POA: Insufficient documentation

## 2017-08-23 DIAGNOSIS — B373 Candidiasis of vulva and vagina: Secondary | ICD-10-CM | POA: Insufficient documentation

## 2017-08-23 DIAGNOSIS — I10 Essential (primary) hypertension: Secondary | ICD-10-CM | POA: Insufficient documentation

## 2017-08-23 DIAGNOSIS — Z23 Encounter for immunization: Secondary | ICD-10-CM

## 2017-08-23 DIAGNOSIS — I63542 Cerebral infarction due to unspecified occlusion or stenosis of left cerebellar artery: Secondary | ICD-10-CM

## 2017-08-23 DIAGNOSIS — F3130 Bipolar disorder, current episode depressed, mild or moderate severity, unspecified: Secondary | ICD-10-CM

## 2017-08-23 DIAGNOSIS — Z09 Encounter for follow-up examination after completed treatment for conditions other than malignant neoplasm: Secondary | ICD-10-CM | POA: Insufficient documentation

## 2017-08-23 DIAGNOSIS — Z7902 Long term (current) use of antithrombotics/antiplatelets: Secondary | ICD-10-CM | POA: Insufficient documentation

## 2017-08-23 DIAGNOSIS — Z8673 Personal history of transient ischemic attack (TIA), and cerebral infarction without residual deficits: Secondary | ICD-10-CM | POA: Insufficient documentation

## 2017-08-23 DIAGNOSIS — B3731 Acute candidiasis of vulva and vagina: Secondary | ICD-10-CM

## 2017-08-23 DIAGNOSIS — H3410 Central retinal artery occlusion, unspecified eye: Secondary | ICD-10-CM | POA: Insufficient documentation

## 2017-08-23 MED ORDER — FLUCONAZOLE 150 MG PO TABS: 150.0000 mg | ORAL_TABLET | Freq: Once | ORAL | 0 refills | Status: AC

## 2017-08-23 NOTE — Progress Notes (Signed)
Subjective:  Patient ID: Kendra Vega, female    DOB: [birth year]  Age: 46 y.o. 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  She had presented to the ED with complaints of being off balance, MRI revealed subacute infarct in the left PICA territory without hemorrhage. CT angiogram of the head and neck revealed occlusion of the left PICA 1.5 cm beyond its origin, moderate right and severe left proximal P2 stenosis, mild right supraclinoid ICA stenosis, unchanged 2 mm left supraclinoid ICA aneurysm, widely patent vertebral artery with suboptimal assessment of the V1 segment. 2-D echo revealed EF of 55-60%, normal systolic function, no regional wall motion abnormalities. She was seen by neurology and recommendation was for dual antiplatelet therapy with aspirin and Plavix for 3 months and then Plavix subsequently.  She reports doing well today and denies weakness in any extremity and her gait is normal. She recently had a right breast ultrasound due to presence of right breast abscess. Ultrasound revealed improved right breast abscess with recommendation for follow-up in one year. Pap smear also done was negative for malignancy.  She does have a vaginal discharge which is whitish and thinks is a yeast infection as she recently completed a course of antibiotic.  Mental health is managed by [facility].  Past Medical History:  Diagnosis Date  . 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 Breast mass    rt breast ?abcess x's 1 week  . Hypertension   . Stroke [facility])     No past surgical history on file.  No Known Allergies   Outpatient Medications  Prior to Visit  Medication Sig Dispense Refill  . acetaminophen (TYLENOL) 500 MG tablet Take 1,000 mg by mouth every 6 (six) hours as needed.    Marland Kitchen amLODipine (NORVASC) 5 MG tablet Take 1 tablet (5 mg total) by mouth daily. 30 tablet 6  . aspirin EC 325 MG EC tablet Take 1 tablet (325 mg total) by mouth daily. 30 tablet 0  . atorvastatin (LIPITOR) 20 MG tablet Take 1 tablet (20 mg total) by mouth daily at 6 PM. 30 tablet 6  . citalopram (CELEXA) 20 MG tablet Take 1 tablet (20 mg total) by mouth daily. 30 tablet 6  . clopidogrel (PLAVIX) 75 MG tablet Take 1 tablet (75 mg total) by mouth daily. 30 tablet 6  . cyclobenzaprine (FLEXERIL) 10 MG tablet Take 1 tablet (10 mg total) by mouth at bedtime as needed for muscle spasms. 30 tablet 2  . hydrOXYzine (ATARAX/VISTARIL) 25 MG tablet Take 1 tablet (25 mg total) by mouth 3 (three) times daily. 90 tablet 6  . lisinopril-hydrochlorothiazide (PRINZIDE,ZESTORETIC) 20-12.5 MG tablet Take 1 tablet by mouth daily. 30 tablet 6  . lurasidone (LATUDA) 20 MG TABS tablet Take 1 tablet (20 mg total) by mouth at bedtime. 60 tablet 6  . metoprolol tartrate (LOPRESSOR) 50 MG tablet Take 1 tablet (50 mg total) by mouth 2 (two) times daily. 60 tablet 6  . nicotine (NICODERM CQ - DOSED IN MG/24 HOURS) 21 mg/24hr patch Place 1 patch (21 mg total) onto the skin daily. 28 patch 0  . amoxicillin-clavulanate (AUGMENTIN) 875-125  MG tablet Take 1 tablet by mouth every 12 (twelve) hours. (Patient not taking: Reported on 08/23/2017) 20 tablet 0  . metroNIDAZOLE (FLAGYL) 500 MG tablet Take 1 tablet (500 mg total) by mouth 2 (two) times daily. (Patient not taking: Reported on 08/23/2017) 14 tablet 0   No facility-administered medications prior to visit.     ROS Review of Systems  Constitutional: Negative for activity change, appetite change and fatigue.  HENT: Negative for congestion, sinus pressure and sore throat.   Eyes: Positive for visual disturbance (Blind in right eye).    Respiratory: Negative for cough, chest tightness, shortness of breath and wheezing.   Cardiovascular: Negative for chest pain and palpitations.  Gastrointestinal: Negative for abdominal distention, abdominal pain and constipation.  Endocrine: Negative for polydipsia.  Genitourinary: Negative for dysuria and frequency.  Musculoskeletal: Negative for arthralgias and back pain.  Skin: Negative for rash.  Neurological: Negative for tremors, light-headedness and numbness.  Hematological: Does not bruise/bleed easily.  Psychiatric/Behavioral: Negative for agitation and behavioral problems.    Objective:  BP 139/87   Pulse 77   Temp 98 F (36.7 C) (Oral)   Ht  (1.651 m)   Wt 226 lb (102.5 kg)   SpO2 98%   BMI 37.61 kg/m   BP/Weight 08/23/2017 08/06/2017 08/05/2017  Systolic BP 139 178 163  Diastolic BP 87 107 88  Wt. (Lbs) 226 220.38 -  BMI 37.61 35.57 -      Physical Exam  Constitutional: She is oriented to person, place, and time. She appears well-developed and well-nourished.  Cardiovascular: Normal rate, normal heart sounds and intact distal pulses.   No murmur heard. Pulmonary/Chest: Effort normal and breath sounds normal. She has no wheezes. She has no rales. She exhibits no tenderness.  Abdominal: Soft. Bowel sounds are normal. She exhibits no distension and no mass. There is no tenderness.  Musculoskeletal: Normal range of motion.  Neurological: She is alert and oriented to person, place, and time. She has normal reflexes. She displays normal reflexes. Coordination normal.  Skin: Skin is warm and dry.  Psychiatric: She has a normal mood and affect.     Assessment & Plan:   1. Cerebrovascular accident (CVA) due to occlusion of left cerebellar artery (HCC) Left cerebral infarct in the PICA territory Previous history of CVA Dual antiplatelet therapy with aspirin and Plavix for 3 months then Plavix subsequently  2. Bipolar depression (HCC) Stable Continue  Latuda, Celexa Follow-up with [facility]  3. Essential hypertension Controlled  4. CRAO (central retinal artery occlusion), right Blind in right eye  5. Vaginal candidiasis Placed on Diflucan  HCM- up-to-date on mammogram and Pap smear.  Meds ordered this encounter  Medications  . fluconazole (DIFLUCAN) 150 MG tablet    Sig: Take 1 tablet (150 mg total) by mouth once.    Dispense:  1 tablet    Refill:  0    Follow-up: Return in about 2 months (around 10/23/2017) for Follow-up of chronic medical conditions.   Jaclyn Shaggy MD

## 2017-08-23 NOTE — Patient Instructions (Signed)

## 2017-08-26 ENCOUNTER — Encounter (HOSPITAL_COMMUNITY): Payer: Self-pay | Admitting: *Deleted

## 2017-08-26 MED FILL — FLUCONAZOLE 150 MG TABLET: 150 | 1 days supply | Qty: 1 | Fill #0

## 2017-09-10 MED FILL — ?CLOPIDOGREL 75MG TAB: 75 | 30 days supply | Qty: 30 | Fill #1

## 2017-09-10 MED FILL — ATORVASTATIN 20 MG TABLET: 20 | 30 days supply | Qty: 30 | Fill #2

## 2017-09-10 MED FILL — ?METOPROLOL 50 MG TABLET: 50 | 30 days supply | Qty: 60 | Fill #1

## 2017-09-10 MED FILL — ?CYCLOBENZAPRINE 10 MG TABL: 10 | 30 days supply | Qty: 30 | Fill #1

## 2017-09-10 MED FILL — LISINOPRIL-HCTZ 20-12.5 MG: 20-12.5 | 30 days supply | Qty: 30 | Fill #5

## 2017-09-10 MED FILL — hydrOXYzine HCL 25 MG TABS: 25 | 30 days supply | Qty: 90 | Fill #4

## 2017-09-10 MED FILL — AMLODIPINE BESYLATE 5 MG TA: 5 | 30 days supply | Qty: 30 | Fill #5

## 2017-10-14 MED FILL — ?CYCLOBENZAPRINE 10 MG TABL: 10 | 30 days supply | Qty: 30 | Fill #2

## 2017-10-14 MED FILL — AMLODIPINE BESYLATE 5 MG TA: 5 | 30 days supply | Qty: 30 | Fill #6

## 2017-10-14 MED FILL — ?CLOPIDOGREL 75MG TAB: 75 | 30 days supply | Qty: 30 | Fill #2

## 2017-10-14 MED FILL — ?METOPROLOL 50 MG TABLET: 50 | 30 days supply | Qty: 60 | Fill #2

## 2017-10-14 MED FILL — hydrOXYzine HCL 25 MG TABS: 25 | 30 days supply | Qty: 90 | Fill #5

## 2017-10-14 MED FILL — ?ATORVASTATIN 20 MG TABLET: 20 | 30 days supply | Qty: 30 | Fill #3

## 2017-10-14 MED FILL — LISINOPRIL-HCTZ 20-12.5 MG: 20-12.5 | 30 days supply | Qty: 30 | Fill #6

## 2017-10-22 ENCOUNTER — Encounter: Payer: Self-pay | Admitting: Family Medicine

## 2017-10-22 ENCOUNTER — Ambulatory Visit: Payer: Self-pay | Attending: Family Medicine | Admitting: Family Medicine

## 2017-10-22 VITALS — BP 148/89 | HR 72 | Temp 97.7°F | Ht 65.0 in | Wt 245.4 lb

## 2017-10-22 DIAGNOSIS — M62838 Other muscle spasm: Secondary | ICD-10-CM

## 2017-10-22 DIAGNOSIS — Z7902 Long term (current) use of antithrombotics/antiplatelets: Secondary | ICD-10-CM | POA: Insufficient documentation

## 2017-10-22 DIAGNOSIS — R519 Headache, unspecified: Secondary | ICD-10-CM | POA: Insufficient documentation

## 2017-10-22 DIAGNOSIS — Z23 Encounter for immunization: Secondary | ICD-10-CM | POA: Insufficient documentation

## 2017-10-22 DIAGNOSIS — Z8673 Personal history of transient ischemic attack (TIA), and cerebral infarction without residual deficits: Secondary | ICD-10-CM

## 2017-10-22 DIAGNOSIS — G4489 Other headache syndrome: Secondary | ICD-10-CM

## 2017-10-22 DIAGNOSIS — Z6841 Body Mass Index (BMI) 40.0 and over, adult: Secondary | ICD-10-CM

## 2017-10-22 DIAGNOSIS — F319 Bipolar disorder, unspecified: Secondary | ICD-10-CM | POA: Insufficient documentation

## 2017-10-22 DIAGNOSIS — Z79899 Other long term (current) drug therapy: Secondary | ICD-10-CM | POA: Insufficient documentation

## 2017-10-22 DIAGNOSIS — H5461 Unqualified visual loss, right eye, normal vision left eye: Secondary | ICD-10-CM | POA: Insufficient documentation

## 2017-10-22 DIAGNOSIS — R51 Headache: Secondary | ICD-10-CM

## 2017-10-22 DIAGNOSIS — Z87891 Personal history of nicotine dependence: Secondary | ICD-10-CM | POA: Insufficient documentation

## 2017-10-22 DIAGNOSIS — I10 Essential (primary) hypertension: Secondary | ICD-10-CM

## 2017-10-22 DIAGNOSIS — Z7982 Long term (current) use of aspirin: Secondary | ICD-10-CM | POA: Insufficient documentation

## 2017-10-22 MED ORDER — ATORVASTATIN CALCIUM 20 MG PO TABS: 20.0000 mg | ORAL_TABLET | Freq: Every day | ORAL | 6 refills | Status: DC

## 2017-10-22 MED ORDER — AMLODIPINE BESYLATE 10 MG PO TABS: 10.0000 mg | ORAL_TABLET | Freq: Every day | ORAL | 3 refills | Status: DC

## 2017-10-22 MED ORDER — CYCLOBENZAPRINE HCL 10 MG PO TABS: 10.0000 mg | ORAL_TABLET | Freq: Two times a day (BID) | ORAL | 2 refills | Status: DC | PRN

## 2017-10-22 MED ORDER — METOPROLOL TARTRATE 50 MG PO TABS: 50.0000 mg | ORAL_TABLET | Freq: Two times a day (BID) | ORAL | 6 refills | Status: DC

## 2017-10-22 MED ORDER — TOPIRAMATE 50 MG PO TABS: 50.0000 mg | ORAL_TABLET | Freq: Two times a day (BID) | ORAL | 3 refills | Status: DC

## 2017-10-22 MED ORDER — LISINOPRIL-HYDROCHLOROTHIAZIDE 20-12.5 MG PO TABS: 1.0000 | ORAL_TABLET | Freq: Every day | ORAL | 6 refills | Status: DC

## 2017-10-22 MED FILL — AMLODIPINE BESYLATE 10 MG T: 10 | 30 days supply | Qty: 30 | Fill #0

## 2017-10-22 MED FILL — ?CYCLOBENZAPRINE 10 MG TABL: 10 | 15 days supply | Qty: 30 | Fill #0

## 2017-10-22 MED FILL — TOPIRAMATE 50 MG TABLET: 50 | 30 days supply | Qty: 60 | Fill #0

## 2017-10-22 NOTE — Patient Instructions (Signed)
Tension Headache A tension headache is pain, pressure, or aching that is felt over the front and sides of your head. These headaches can last from 30 minutes to several days. Follow these instructions at home: Managing pain  Take over-the-counter and prescription medicines only as told by your doctor.  Lie down in a dark, quiet room when you have a headache.  If directed, apply ice to your head and neck area: ? Put ice in a plastic bag. ? Place a towel between your skin and the bag. ? Leave the ice on for 20 minutes, 2-3 times per day.  Use a heating pad or a hot shower to apply heat to your head and neck area as told by your doctor. Eating and drinking  Eat meals on a regular schedule.  Do not drink a lot of alcohol.  Do not use a lot of caffeine, or stop using caffeine. General instructions  Keep all follow-up visits as told by your doctor. This is important.  Keep a journal to find out if certain things bring on headaches. For example, write down: ? What you eat and drink. ? How much sleep you get. ? Any change to your diet or medicines.  Try getting a massage, or doing other things that help you to relax.  Lessen stress.  Sit up straight. Do not tighten (tense) your muscles.  Do not use tobacco products. This includes cigarettes, chewing tobacco, or e-cigarettes. If you need help quitting, ask your doctor.  Exercise regularly as told by your doctor.  Get enough sleep. This may mean 7-9 hours of sleep. Contact a doctor if:  Your symptoms are not helped by medicine.  You have a headache that feels different from your usual headache.  You feel sick to your stomach (nauseous) or you throw up (vomit).  You have a fever. Get help right away if:  Your headache becomes very bad.  You keep throwing up.  You have a stiff neck.  You have trouble seeing.  You have trouble speaking.  You have pain in your eye or ear.  Your muscles are weak or you lose muscle  control.  You lose your balance or you have trouble walking.  You feel like you will pass out (faint) or you pass out.  You have confusion. This information is not intended to replace advice given to you by your health care provider. Make sure you discuss any questions you have with your health care provider. Document Released: 02/06/2010 Document Revised: 07/12/2016 Document Reviewed: 03/07/2015 Elsevier Interactive Patient Education  2018 Elsevier Inc.  

## 2017-10-22 NOTE — Progress Notes (Signed)
Subjective:  Patient ID: Kendra CaldronLiz D Mahon, female    DOB: 12-09-1970  Age: 46 y.o. 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  She has been on dual antiplatelet therapy with Plavix and aspirin which she has tolerated and denies bleeding or bruising.  She has no residual deficits from her stroke. She complains of headaches ever since she had her stroke which are described as moderate but not associated with neck pain, blurry vision, nausea or vomiting.  She has been compliant with her antihypertensive and tolerates her medications with no side effects.  She is concerned about weight gain ever since she quit smoking as she eats whenever she has the craving to smoke -she has gained 25 pounds in the last 10 weeks.  Past Medical History:  Diagnosis Date  . 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 Breast mass    rt breast ?abcess x's 1 week  . Hypertension   . Stroke [facility](HCC)     No past surgical history on file.  No Known Allergies   Outpatient Medications Prior to Visit  Medication Sig Dispense Refill  . acetaminophen (TYLENOL) 500 MG tablet Take 1,000 mg by mouth every 6 (six) hours as needed.    Marland Kitchen. aspirin EC 325 MG EC tablet Take 1 tablet (325 mg total) by mouth daily. 30 tablet 0  . citalopram (CELEXA) 20 MG tablet Take 1 tablet (20 mg total) by mouth daily. 30 tablet 6  . clopidogrel (PLAVIX) 75 MG tablet Take 1 tablet (75 mg total) by mouth daily. 30 tablet 6  . hydrOXYzine (ATARAX/VISTARIL) 25 MG tablet Take 1 tablet (25 mg total) by mouth 3 (three) times daily. 90 tablet 6  . lurasidone (LATUDA) 20 MG TABS tablet Take 1 tablet (20 mg total) by mouth at bedtime. 60 tablet 6  . amLODipine (NORVASC) 5 MG tablet Take 1 tablet (5  mg total) by mouth daily. 30 tablet 6  . atorvastatin (LIPITOR) 20 MG tablet Take 1 tablet (20 mg total) by mouth daily at 6 PM. 30 tablet 6  . cyclobenzaprine (FLEXERIL) 10 MG tablet Take 1 tablet (10 mg total) by mouth at bedtime as needed for muscle spasms. 30 tablet 2  . lisinopril-hydrochlorothiazide (PRINZIDE,ZESTORETIC) 20-12.5 MG tablet Take 1 tablet by mouth daily. 30 tablet 6  . metoprolol tartrate (LOPRESSOR) 50 MG tablet Take 1 tablet (50 mg total) by mouth 2 (two) times daily. 60 tablet 6  . amoxicillin-clavulanate (AUGMENTIN) 875-125 MG tablet Take 1 tablet by mouth every 12 (twelve) hours. (Patient not taking: Reported on 08/23/2017) 20 tablet 0  . metroNIDAZOLE (FLAGYL) 500 MG tablet Take 1 tablet (500 mg total) by mouth 2 (two) times daily. (Patient not taking: Reported on 08/23/2017) 14 tablet 0  . nicotine (NICODERM CQ - DOSED IN MG/24 HOURS) 21 mg/24hr patch Place 1 patch (21 mg total) onto the skin daily. (Patient not taking: Reported on 10/22/2017) 28 patch 0   No facility-administered medications prior to visit.     ROS Review of Systems  Constitutional: Positive for unexpected weight change. Negative for activity change, appetite change and fatigue.  HENT: Negative for congestion, sinus pressure and sore throat.   Eyes: Positive for visual disturbance.  Respiratory:  Negative for cough, chest tightness, shortness of breath and wheezing.   Cardiovascular: Negative for chest pain and palpitations.  Gastrointestinal: Negative for abdominal distention, abdominal pain and constipation.  Endocrine: Negative for polydipsia.  Genitourinary: Negative for dysuria and frequency.  Musculoskeletal: Negative for arthralgias and back pain.  Skin: Negative for rash.  Neurological: Positive for headaches. Negative for tremors, light-headedness and numbness.  Hematological: Does not bruise/bleed easily.  Psychiatric/Behavioral: Negative for agitation and behavioral problems.     Objective:  BP (!) 148/89   Pulse 72   Temp 97.7 F (36.5 C) (Oral)   Ht 5\' 5"  (1.651 m)   Wt 245 lb 6.4 oz (111.3 kg)   SpO2 97%   BMI 40.84 kg/m   BP/Weight 10/22/2017 08/23/2017 08/06/2017  Systolic BP 148 139 178  Diastolic BP 89 87 107  Wt. (Lbs) 245.4 226 220.38  BMI 40.84 37.61 35.57      Physical Exam  Constitutional: She is oriented to person, place, and time. She appears well-developed and well-nourished.  Cardiovascular: Normal rate, normal heart sounds and intact distal pulses.  No murmur heard. Pulmonary/Chest: Effort normal and breath sounds normal. She has no wheezes. She has no rales. She exhibits no tenderness.  Abdominal: Soft. Bowel sounds are normal. She exhibits no distension and no mass. There is no tenderness.  Musculoskeletal: Normal range of motion.  Neurological: She is alert and oriented to person, place, and time.  Skin: Skin is warm and dry.  Psychiatric: She has a normal mood and affect.     Assessment & Plan:   1. Essential hypertension Slightly elevated above goal of less than 130/80 Increase amlodipine dose - amLODipine (NORVASC) 10 MG tablet; Take 1 tablet (10 mg total) by mouth daily.  Dispense: 60 tablet; Refill: 3 - lisinopril-hydrochlorothiazide (PRINZIDE,ZESTORETIC) 20-12.5 MG tablet; Take 1 tablet by mouth daily.  Dispense: 30 tablet; Refill: 6 - metoprolol tartrate (LOPRESSOR) 50 MG tablet; Take 1 tablet (50 mg total) by mouth 2 (two) times daily.  Dispense: 60 tablet; Refill: 6  2. Need for influenza vaccination - Flu Vaccine QUAD 36+ mos IM  3. History of CVA (cerebrovascular accident) H/o multiple CVAs No residual deficits Continue dual antiplatelet therapy with Plavix and aspirin until 11/04/17 after which she will discontinue Plavix and continue ASA 325mg  Risk factor modification - atorvastatin (LIPITOR) 20 MG tablet; Take 1 tablet (20 mg total) by mouth daily at 6 PM.  Dispense: 30 tablet; Refill: 6  4. Muscle  spasm Advised to apply heat Could increase Flexeril to twice daily as needed - cyclobenzaprine (FLEXERIL) 10 MG tablet; Take 1 tablet (10 mg total) by mouth 2 (two) times daily as needed for muscle spasms.  Dispense: 30 tablet; Refill: 2  5. Other headache syndrome Post stroke headaches Comments Topamax-side effects of been discussed - topiramate (TOPAMAX) 50 MG tablet; Take 1 tablet (50 mg total) by mouth 2 (two) times daily.  Dispense: 60 tablet; Refill: 3  6. Class 3 severe obesity due to excess calories without serious comorbidity with body mass index (BMI) of 40.0 to 44.9 in adult [facility]) Secondary to intake of excess calories post smoking cessation Hopefully addition of Topamax will help Restrict caloric intake, increase physical activity to 150 minutes/week   Meds ordered this encounter  Medications  . amLODipine (NORVASC) 10 MG tablet    Sig: Take 1 tablet (10 mg total) by mouth daily.    Dispense:  60 tablet    Refill:  3  . atorvastatin (LIPITOR)  20 MG tablet    Sig: Take 1 tablet (20 mg total) by mouth daily at 6 PM.    Dispense:  30 tablet    Refill:  6  . cyclobenzaprine (FLEXERIL) 10 MG tablet    Sig: Take 1 tablet (10 mg total) by mouth 2 (two) times daily as needed for muscle spasms.    Dispense:  30 tablet    Refill:  2  . lisinopril-hydrochlorothiazide (PRINZIDE,ZESTORETIC) 20-12.5 MG tablet    Sig: Take 1 tablet by mouth daily.    Dispense:  30 tablet    Refill:  6  . metoprolol tartrate (LOPRESSOR) 50 MG tablet    Sig: Take 1 tablet (50 mg total) by mouth 2 (two) times daily.    Dispense:  60 tablet    Refill:  6  . topiramate (TOPAMAX) 50 MG tablet    Sig: Take 1 tablet (50 mg total) by mouth 2 (two) times daily.    Dispense:  60 tablet    Refill:  3    Follow-up: Return in about 3 months (around 01/22/2018) for Follow-up of hypertension and CVA.   Jaclyn ShaggyEnobong Amao MD

## 2017-11-11 MED FILL — LISINOPRIL-HCTZ 20-12.5 MG: 20-12.5 | 30 days supply | Qty: 30 | Fill #0

## 2017-11-11 MED FILL — ?ATORVASTATIN 20 MG TABLET: 20 | 30 days supply | Qty: 30 | Fill #4

## 2017-11-11 MED FILL — CYCLOBENZAPRINE 10 MG TAB: 10 | 15 days supply | Qty: 30 | Fill #1

## 2017-11-11 MED FILL — ?METOPROLOL 50 MG TABLET: 50 | 30 days supply | Qty: 60 | Fill #3

## 2017-11-27 MED FILL — AMLODIPINE BESYLATE 10 MG T: 10 | 30 days supply | Qty: 30 | Fill #1

## 2017-12-11 ENCOUNTER — Other Ambulatory Visit: Payer: Self-pay | Admitting: Critical Care Medicine

## 2017-12-11 MED FILL — ?METOPROLOL 50 MG TABLET: 50 | 30 days supply | Qty: 60 | Fill #4

## 2017-12-11 MED FILL — ?ATORVASTATIN 20MG TABLET: 20 | 30 days supply | Qty: 30 | Fill #5

## 2017-12-11 MED FILL — LISINOPRIL-HCTZ 20-12.5 MG: 20-12.5 | 30 days supply | Qty: 30 | Fill #1

## 2017-12-11 MED FILL — CYCLOBENZAPRINE 10 MG TAB: 10 | 15 days supply | Qty: 30 | Fill #2

## 2017-12-25 ENCOUNTER — Telehealth: Payer: Self-pay | Admitting: Family Medicine

## 2017-12-25 NOTE — Telephone Encounter (Signed)
This was prescribed by Dr. Delford FieldWright, will forward to Dr. Alvis LemmingsNewlin for approval

## 2017-12-25 NOTE — Telephone Encounter (Signed)
Pt called to get a refill for hydrOXYzine (ATARAX/VISTARIL) 25 MG tablet Please [facility], please follow up

## 2017-12-26 NOTE — Telephone Encounter (Signed)
I have reviewed her chart and this was prescribed by Dr. Delford FieldWright.  It does appear she has a history of bipolar disorder and [facility].  I would rather she obtained that prescription from them.

## 2017-12-26 NOTE — Telephone Encounter (Signed)
Patient was called and informed that medication was given to her for anxiety and not Blood pressure.

## 2018-01-01 ENCOUNTER — Other Ambulatory Visit: Payer: Self-pay | Admitting: Family Medicine

## 2018-01-01 DIAGNOSIS — M62838 Other muscle spasm: Secondary | ICD-10-CM

## 2018-01-01 MED FILL — AMLODIPINE BESYLATE 10 MG T: 10 | 30 days supply | Qty: 30 | Fill #2

## 2018-01-02 MED FILL — CYCLOBENZAPRINE 10 MG TAB: 10 | 15 days supply | Qty: 30 | Fill #0

## 2018-01-07 MED FILL — LISINOPRIL-HCTZ 20-12.5 MG: 20-12.5 | 30 days supply | Qty: 30 | Fill #2

## 2018-01-08 MED FILL — ?ATORVASTATIN 20MG TABL: 20 | 30 days supply | Qty: 30 | Fill #6

## 2018-01-08 MED FILL — ?METOPROLOL TARTRATE 50MG T: 50 | 30 days supply | Qty: 60 | Fill #5

## 2018-01-24 ENCOUNTER — Ambulatory Visit: Payer: Self-pay | Admitting: Family Medicine

## 2018-01-29 MED FILL — CYCLOBENZAPRINE 10 MG TAB: 10 | 15 days supply | Qty: 30 | Fill #1

## 2018-02-04 ENCOUNTER — Ambulatory Visit: Payer: Self-pay | Attending: Family Medicine | Admitting: Family Medicine

## 2018-02-04 ENCOUNTER — Encounter: Payer: Self-pay | Admitting: Family Medicine

## 2018-02-04 VITALS — BP 131/85 | HR 76 | Temp 97.6°F | Ht 65.0 in | Wt 260.0 lb

## 2018-02-04 DIAGNOSIS — Z8673 Personal history of transient ischemic attack (TIA), and cerebral infarction without residual deficits: Secondary | ICD-10-CM | POA: Insufficient documentation

## 2018-02-04 DIAGNOSIS — F17210 Nicotine dependence, cigarettes, uncomplicated: Secondary | ICD-10-CM | POA: Insufficient documentation

## 2018-02-04 DIAGNOSIS — Z79899 Other long term (current) drug therapy: Secondary | ICD-10-CM | POA: Insufficient documentation

## 2018-02-04 DIAGNOSIS — Z6841 Body Mass Index (BMI) 40.0 and over, adult: Secondary | ICD-10-CM | POA: Insufficient documentation

## 2018-02-04 DIAGNOSIS — F319 Bipolar disorder, unspecified: Secondary | ICD-10-CM

## 2018-02-04 DIAGNOSIS — G4489 Other headache syndrome: Secondary | ICD-10-CM | POA: Insufficient documentation

## 2018-02-04 DIAGNOSIS — F3130 Bipolar disorder, current episode depressed, mild or moderate severity, unspecified: Secondary | ICD-10-CM | POA: Insufficient documentation

## 2018-02-04 DIAGNOSIS — R600 Localized edema: Secondary | ICD-10-CM | POA: Insufficient documentation

## 2018-02-04 DIAGNOSIS — Z7982 Long term (current) use of aspirin: Secondary | ICD-10-CM | POA: Insufficient documentation

## 2018-02-04 DIAGNOSIS — I10 Essential (primary) hypertension: Secondary | ICD-10-CM | POA: Insufficient documentation

## 2018-02-04 MED ORDER — LISINOPRIL-HYDROCHLOROTHIAZIDE 20-12.5 MG PO TABS: 2.0000 | ORAL_TABLET | Freq: Every day | ORAL | 6 refills | Status: DC

## 2018-02-04 MED FILL — LISINOPRIL-HCTZ 20-12.5 MG: 20-12.5 | 30 days supply | Qty: 60 | Fill #0

## 2018-02-04 NOTE — Progress Notes (Signed)
Subjective:  Patient ID: Kendra Vega, female    DOB: 1970-11-28  Age: 47 y.o. 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 She endorses compliance with her antihypertensive and tolerates it without adverse effects.  Also doing well on her statin with no complaints of myalgia, denies bruising with Plavix. I had placed her on Topamax for chronic headaches which were thought to be post stroke and she reports improvement in symptoms. Bipolar disorder is managed by mental health at [facility] and she is doing well on Latuda and Celexa.  She had gained 40 pounds over the last 6 months ever since she quit smoking  and admits to snacking a lot whenever she has a craving to smoke.  She intermittently smokes 1-2 cigarettes every now and then.  Denies involvement in an exercise regimen. No additional concerns today.   Past Medical History:  Diagnosis Date  . 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 Breast mass    rt breast ?abcess x's 1 week  . Hypertension   . Stroke [facility])     No past surgical history on file.  No Known Allergies   Outpatient Medications Prior to Visit  Medication Sig Dispense Refill  . acetaminophen (TYLENOL) 500 MG tablet Take 1,000 mg by mouth every 6 (six) hours as needed.    Marland Kitchen aspirin EC 325 MG EC tablet Take 1 tablet (325 mg total) by mouth daily. 30 tablet 0  . atorvastatin (LIPITOR) 20 MG tablet Take 1 tablet (20 mg total) by mouth daily at 6 PM. 30 tablet 6  . citalopram (CELEXA) 20 MG tablet Take 1 tablet (20 mg total) by mouth daily. 30 tablet 6  . clopidogrel (PLAVIX) 75 MG tablet Take 1 tablet (75 mg total) by mouth daily. 30 tablet 6  .  cyclobenzaprine (FLEXERIL) 10 MG tablet TAKE 1 TABLET (10 MG TOTAL) BY MOUTH 2 (TWO) TIMES DAILY AS NEEDED FOR MUSCLE SPASMS. 30 tablet 2  . hydrOXYzine (ATARAX/VISTARIL) 25 MG tablet Take 1 tablet (25 mg total) by mouth 3 (three) times daily. 90 tablet 6  . lurasidone (LATUDA) 20 MG TABS tablet Take 1 tablet (20 mg total) by mouth at bedtime. 60 tablet 6  . metoprolol tartrate (LOPRESSOR) 50 MG tablet Take 1 tablet (50 mg total) by mouth 2 (two) times daily. 60 tablet 6  . topiramate (TOPAMAX) 50 MG tablet Take 1 tablet (50 mg total) by mouth 2 (two) times daily. 60 tablet 3  . amLODipine (NORVASC) 10 MG tablet Take 1 tablet (10 mg total) by mouth daily. 60 tablet 3  . lisinopril-hydrochlorothiazide (PRINZIDE,ZESTORETIC) 20-12.5 MG tablet Take 1 tablet by mouth daily. 30 tablet 6  . amoxicillin-clavulanate (AUGMENTIN) 875-125 MG tablet Take 1 tablet by mouth every 12 (twelve) hours. (Patient not taking: Reported on 08/23/2017) 20 tablet 0  . metroNIDAZOLE (FLAGYL) 500 MG tablet Take 1 tablet (500 mg total) by mouth 2 (two) times daily. (Patient not taking: Reported on 08/23/2017) 14 tablet 0  . nicotine (NICODERM CQ - DOSED IN MG/24 HOURS) 21 mg/24hr patch Place 1 patch (21 mg total) onto the skin daily. (Patient not taking: Reported on 10/22/2017) 28 patch  0   No facility-administered medications prior to visit.     ROS Review of Systems  Constitutional: Positive for unexpected weight change. Negative for activity change, appetite change and fatigue.  HENT: Negative for congestion, sinus pressure and sore throat.   Eyes: Negative for visual disturbance.  Respiratory: Negative for cough, chest tightness, shortness of breath and wheezing.   Cardiovascular: Negative for chest pain and palpitations.  Gastrointestinal: Negative for abdominal distention, abdominal pain and constipation.  Endocrine: Negative for polydipsia.  Genitourinary: Negative for dysuria and frequency.  Musculoskeletal:  Negative for arthralgias and back pain.  Skin: Negative for rash.  Neurological: Negative for tremors, light-headedness and numbness.  Hematological: Does not bruise/bleed easily.  Psychiatric/Behavioral: Negative for agitation and behavioral problems.    Objective:  BP 131/85   Pulse 76   Temp 97.6 F (36.4 C) (Oral)   Ht '5\' 5"'  (1.651 m)   Wt 260 lb (117.9 kg)   SpO2 96%   BMI 43.27 kg/m   BP/Weight 02/04/2018 10/22/2017 0/93/8182  Systolic BP 993 716 967  Diastolic BP 85 89 87  Wt. (Lbs) 260 245.4 226  BMI 43.27 40.84 37.61    Filed Weights   02/04/18 1447  Weight: 260 lb (117.9 kg)     Physical Exam  Constitutional: She is oriented to person, place, and time. She appears well-developed and well-nourished.  Cardiovascular: Normal rate, normal heart sounds and intact distal pulses.  No murmur heard. Pulmonary/Chest: Effort normal and breath sounds normal. She has no wheezes. She has no rales. She exhibits no tenderness.  Abdominal: Soft. Bowel sounds are normal. She exhibits no distension and no mass. There is no tenderness.  Musculoskeletal: Normal range of motion. She exhibits edema (1+b/l pitting pedal edema).  Neurological: She is alert and oriented to person, place, and time.  Skin: Skin is warm and dry.  Psychiatric: She has a normal mood and affect.    CMP Latest Ref Rng & Units 02/04/2018 08/05/2017 08/04/2017  Glucose 65 - 99 mg/dL 118(H) 114(H) 119(H)  BUN 6 - 24 mg/dL '13 14 9  ' Creatinine 0.57 - 1.00 mg/dL 1.28(H) 1.24(H) 1.30(H)  Sodium 134 - 144 mmol/L 137 134(L) 135  Potassium 3.5 - 5.2 mmol/L 4.0 3.7 3.3(L)  Chloride 96 - 106 mmol/L 100 101 102  CO2 20 - 29 mmol/L '24 26 26  ' Calcium 8.7 - 10.2 mg/dL 9.5 9.0 8.9  Total Protein 6.0 - 8.5 g/dL 6.9 - -  Total Bilirubin 0.0 - 1.2 mg/dL <0.2 - -  Alkaline Phos 39 - 117 IU/L 119(H) - -  AST 0 - 40 IU/L 36 - -  ALT 0 - 32 IU/L 50(H) - -    Lipid Panel     Component Value Date/Time   CHOL 113 08/04/2017  0455   TRIG 176 (H) 08/04/2017 0455   HDL 28 (L) 08/04/2017 0455   CHOLHDL 4.0 08/04/2017 0455   VLDL 35 08/04/2017 0455   LDLCALC 50 08/04/2017 0455     Assessment & Plan:   1. Essential hypertension Controlled Increased dose of lisinopril/HCTZ due to discontinuation of amlodipine Counseled on blood pressure goal of less than 130/80, low-sodium, DASH diet, medication compliance, 150 minutes of moderate intensity exercise per week. Discussed medication compliance, adverse effects. - lisinopril-hydrochlorothiazide (PRINZIDE,ZESTORETIC) 20-12.5 MG tablet; Take 2 tablets by mouth daily.  Dispense: 60 tablet; Refill: 6 - CMP14+EGFR  2. Pedal edema Likely from amlodipine which I have discontinued Reduce sodium intake Elevate feet, use compression stockings  3. History of CVA (cerebrovascular accident) Stable, no residual deficits  4. Other headache syndrome Post stroke headaches Controlled on Topamax  5. Bipolar depression ([place]) Currently on Latuda Followed by mental health  6. Class 3 severe obesity due to excess calories without serious comorbidity with body mass index (BMI) of 40.0 to 44.9 in adult (HCC) Weight gain of 40 pounds in the last 6 months likely from cessation of tobacco use even though she does still smoke 1-2 cigarettes intermittently Counseled on increasing physical reducing portion sizes, avoiding late meals.  Activity to 150 minutes/week,    Meds ordered this encounter  Medications  . lisinopril-hydrochlorothiazide (PRINZIDE,ZESTORETIC) 20-12.5 MG tablet    Sig: Take 2 tablets by mouth daily.    Dispense:  60 tablet    Refill:  6    Dose increase, discontinue amlodipine    Follow-up: Return in about 3 months (around 05/07/2018) for follow up of chronic medical conditions.   Charlott Rakes MD

## 2018-02-04 NOTE — Patient Instructions (Signed)

## 2018-02-04 NOTE — Progress Notes (Signed)
Patient states that her feet are swelling.

## 2018-02-05 ENCOUNTER — Encounter: Payer: Self-pay | Admitting: Family Medicine

## 2018-02-05 LAB — CMP14+EGFR
A/G RATIO: 1.3 (ref 1.2–2.2)
ALT: 50 IU/L — ABNORMAL HIGH (ref 0–32)
AST: 36 IU/L (ref 0–40)
Albumin: 3.9 g/dL (ref 3.5–5.5)
Alkaline Phosphatase: 119 IU/L — ABNORMAL HIGH (ref 39–117)
BUN/Creatinine Ratio: 10 (ref 9–23)
BUN: 13 mg/dL (ref 6–24)
CALCIUM: 9.5 mg/dL (ref 8.7–10.2)
CO2: 24 mmol/L (ref 20–29)
Chloride: 100 mmol/L (ref 96–106)
Creatinine, Ser: 1.28 mg/dL — ABNORMAL HIGH (ref 0.57–1.00)
GFR calc Af Amer: 58 mL/min/{1.73_m2} — ABNORMAL LOW (ref >59)
GFR, EST NON AFRICAN AMERICAN: 50 mL/min/{1.73_m2} — AB (ref >59)
GLOBULIN, TOTAL: 3 g/dL (ref 1.5–4.5)
Glucose: 118 mg/dL — ABNORMAL HIGH (ref 65–99)
POTASSIUM: 4 mmol/L (ref 3.5–5.2)
SODIUM: 137 mmol/L (ref 134–144)
Total Protein: 6.9 g/dL (ref 6.0–8.5)

## 2018-02-06 ENCOUNTER — Telehealth: Payer: Self-pay

## 2018-02-06 NOTE — Telephone Encounter (Signed)
Patient was called and informed of lab results. 

## 2018-02-19 MED FILL — ATORVASTATIN 20 MG TABLET: 20 | 30 days supply | Qty: 30 | Fill #0

## 2018-02-19 MED FILL — CYCLOBENZAPRINE 10 MG TAB: 10 | 15 days supply | Qty: 30 | Fill #2

## 2018-02-19 MED FILL — METOPROLOL TARTRATE 50 MG T: 50 | 30 days supply | Qty: 60 | Fill #6

## 2018-02-26 ENCOUNTER — Ambulatory Visit: Payer: Self-pay | Attending: Family Medicine

## 2018-03-13 MED FILL — LISINOPRIL-HCTZ 20-12.5 MG: 20-12.5 | 30 days supply | Qty: 60 | Fill #1

## 2018-03-25 MED FILL — METOPROLOL TARTRATE 50 MG T: 50 | 30 days supply | Qty: 60 | Fill #0

## 2018-03-25 MED FILL — ATORVASTATIN 20 MG TABLET: 20 | 30 days supply | Qty: 30 | Fill #1

## 2018-04-14 ENCOUNTER — Other Ambulatory Visit: Payer: Self-pay | Admitting: Family Medicine

## 2018-04-14 DIAGNOSIS — M62838 Other muscle spasm: Secondary | ICD-10-CM

## 2018-04-14 MED FILL — ATORVASTATIN 20 MG TABLET: 20 | 30 days supply | Qty: 30 | Fill #2

## 2018-04-14 MED FILL — CYCLOBENZAPRINE 10 MG TAB: 10 | 15 days supply | Qty: 30 | Fill #0

## 2018-04-14 MED FILL — LISINOPRIL-HCTZ 20-12.5 MG: 20-12.5 | 30 days supply | Qty: 60 | Fill #2

## 2018-04-29 MED FILL — METOPROLOL TARTRATE 50 MG T: 50 | 30 days supply | Qty: 60 | Fill #1

## 2018-05-07 ENCOUNTER — Encounter: Payer: Self-pay | Admitting: Family Medicine

## 2018-05-07 ENCOUNTER — Ambulatory Visit: Payer: Self-pay | Attending: Family Medicine | Admitting: Family Medicine

## 2018-05-07 VITALS — BP 142/88 | HR 95 | Temp 97.5°F | Ht 65.0 in | Wt 260.8 lb

## 2018-05-07 DIAGNOSIS — Z6841 Body Mass Index (BMI) 40.0 and over, adult: Secondary | ICD-10-CM

## 2018-05-07 DIAGNOSIS — Z79899 Other long term (current) drug therapy: Secondary | ICD-10-CM | POA: Insufficient documentation

## 2018-05-07 DIAGNOSIS — K5909 Other constipation: Secondary | ICD-10-CM

## 2018-05-07 DIAGNOSIS — E66813 Obesity, class 3: Secondary | ICD-10-CM

## 2018-05-07 DIAGNOSIS — F319 Bipolar disorder, unspecified: Secondary | ICD-10-CM

## 2018-05-07 DIAGNOSIS — E119 Type 2 diabetes mellitus without complications: Secondary | ICD-10-CM

## 2018-05-07 DIAGNOSIS — K5900 Constipation, unspecified: Secondary | ICD-10-CM | POA: Insufficient documentation

## 2018-05-07 DIAGNOSIS — Z8673 Personal history of transient ischemic attack (TIA), and cerebral infarction without residual deficits: Secondary | ICD-10-CM

## 2018-05-07 DIAGNOSIS — I10 Essential (primary) hypertension: Secondary | ICD-10-CM

## 2018-05-07 DIAGNOSIS — H5461 Unqualified visual loss, right eye, normal vision left eye: Secondary | ICD-10-CM | POA: Insufficient documentation

## 2018-05-07 DIAGNOSIS — F3130 Bipolar disorder, current episode depressed, mild or moderate severity, unspecified: Secondary | ICD-10-CM | POA: Insufficient documentation

## 2018-05-07 DIAGNOSIS — Z7982 Long term (current) use of aspirin: Secondary | ICD-10-CM | POA: Insufficient documentation

## 2018-05-07 LAB — POCT GLYCOSYLATED HEMOGLOBIN (HGB A1C): HbA1c, POC (prediabetic range): 6.7 % — AB (ref 5.7–6.4)

## 2018-05-07 MED ORDER — METFORMIN HCL 500 MG PO TABS: 1000.0000 mg | ORAL_TABLET | Freq: Two times a day (BID) | ORAL | 6 refills | Status: DC

## 2018-05-07 MED ORDER — POLYETHYLENE GLYCOL 3350 17 GM/SCOOP PO POWD: 17.0000 g | Freq: Every day | ORAL | 1 refills | Status: DC

## 2018-05-07 MED ORDER — LISINOPRIL-HYDROCHLOROTHIAZIDE 20-12.5 MG PO TABS: 2.0000 | ORAL_TABLET | Freq: Every day | ORAL | 6 refills | Status: DC

## 2018-05-07 MED ORDER — TRUEPLUS LANCETS 28G MISC: 1.0000 | Freq: Every day | 12 refills | Status: DC

## 2018-05-07 MED ORDER — METOPROLOL TARTRATE 50 MG PO TABS: 50.0000 mg | ORAL_TABLET | Freq: Two times a day (BID) | ORAL | 6 refills | Status: DC

## 2018-05-07 MED ORDER — TRUE METRIX METER DEVI: 1.0000 | Freq: Every day | 0 refills | Status: DC

## 2018-05-07 MED ORDER — ATORVASTATIN CALCIUM 20 MG PO TABS: 20.0000 mg | ORAL_TABLET | Freq: Every day | ORAL | 6 refills | Status: DC

## 2018-05-07 MED ORDER — CLOPIDOGREL BISULFATE 75 MG PO TABS: 75.0000 mg | ORAL_TABLET | Freq: Every day | ORAL | 6 refills | Status: DC

## 2018-05-07 MED ORDER — GLUCOSE BLOOD VI STRP: ORAL_STRIP | 12 refills | Status: DC

## 2018-05-07 MED FILL — !TRUE METRIX BLOOD GLUCOSE: 1 days supply | Qty: 1 | Fill #0

## 2018-05-07 MED FILL — TRUEplus LANCETS 28G MISC: 30 days supply | Qty: 100 | Fill #0

## 2018-05-07 MED FILL — TRUE METRIX TEST STRIP: 100 days supply | Qty: 100 | Fill #0

## 2018-05-07 MED FILL — POLYETHYLENE GLYCOL 3350 PO: 30 days supply | Qty: 510 | Fill #0

## 2018-05-07 NOTE — Patient Instructions (Addendum)
Calorie Counting for Weight Loss Calories are units of energy. Your body needs a certain amount of calories from food to keep you going throughout the day. When you eat more calories than your body needs, your body stores the extra calories as fat. When you eat fewer calories than your body needs, your body burns fat to get the energy it needs. Calorie counting means keeping track of how many calories you eat and drink each day. Calorie counting can be helpful if you need to lose weight. If you make sure to eat fewer calories than your body needs, you should lose weight. Ask your health care provider what a healthy weight is for you. For calorie counting to work, you will need to eat the right number of calories in a day in order to lose a healthy amount of weight per week. A dietitian can help you determine how many calories you need in a day and will give you suggestions on how to reach your calorie goal.  A healthy amount of weight to lose per week is usually 1-2 lb (0.5-0.9 kg). This usually means that your daily calorie intake should be reduced by 500-750 calories.  Eating 1,200 - 1,500 calories per day can help most women lose weight.  Eating 1,500 - 1,800 calories per day can help most men lose weight.  What is my plan? My goal is to have __________ calories per day. If I have this many calories per day, I should lose around __________ pounds per week. What do I need to know about calorie counting? In order to meet your daily calorie goal, you will need to:  Find out how many calories are in each food you would like to eat. Try to do this before you eat.  Decide how much of the food you plan to eat.  Write down what you ate and how many calories it had. Doing this is called keeping a food log.  To successfully lose weight, it is important to balance calorie counting with a healthy lifestyle that includes regular activity. Aim for 150 minutes of moderate exercise (such as walking) or 75  minutes of vigorous exercise (such as running) each week. Where do I find calorie information?  The number of calories in a food can be found on a Nutrition Facts label. If a food does not have a Nutrition Facts label, try to look up the calories online or ask your dietitian for help. Remember that calories are listed per serving. If you choose to have more than one serving of a food, you will have to multiply the calories per serving by the amount of servings you plan to eat. For example, the label on a package of bread might say that a serving size is 1 slice and that there are 90 calories in a serving. If you eat 1 slice, you will have eaten 90 calories. If you eat 2 slices, you will have eaten 180 calories. How do I keep a food log? Immediately after each meal, record the following information in your food log:  What you ate. Don't forget to include toppings, sauces, and other extras on the food.  How much you ate. This can be measured in cups, ounces, or number of items.  How many calories each food and drink had.  The total number of calories in the meal.  Keep your food log near you, such as in a small notebook in your pocket, or use a mobile app or website. Some   programs will calculate calories for you and show you how many calories you have left for the day to meet your goal. What are some calorie counting tips?  Use your calories on foods and drinks that will fill you up and not leave you hungry: ? Some examples of foods that fill you up are nuts and nut butters, vegetables, lean proteins, and high-fiber foods like whole grains. High-fiber foods are foods with more than 5 g fiber per serving. ? Drinks such as sodas, specialty coffee drinks, alcohol, and juices have a lot of calories, yet do not fill you up.  Eat nutritious foods and avoid empty calories. Empty calories are calories you get from foods or beverages that do not have many vitamins or protein, such as candy, sweets, and  soda. It is better to have a nutritious high-calorie food (such as an avocado) than a food with few nutrients (such as a bag of chips).  Know how many calories are in the foods you eat most often. This will help you calculate calorie counts faster.  Pay attention to calories in drinks. Low-calorie drinks include water and unsweetened drinks.  Pay attention to nutrition labels for "low fat" or "fat free" foods. These foods sometimes have the same amount of calories or more calories than the full fat versions. They also often have added sugar, starch, or salt, to make up for flavor that was removed with the fat.  Find a way of tracking calories that works for you. Get creative. Try different apps or programs if writing down calories does not work for you. What are some portion control tips?  Know how many calories are in a serving. This will help you know how many servings of a certain food you can have.  Use a measuring cup to measure serving sizes. You could also try weighing out portions on a kitchen scale. With time, you will be able to estimate serving sizes for some foods.  Take some time to put servings of different foods on your favorite plates, bowls, and cups so you know what a serving looks like.  Try not to eat straight from a bag or box. Doing this can lead to overeating. Put the amount you would like to eat in a cup or on a plate to make sure you are eating the right portion.  Use smaller plates, glasses, and bowls to prevent overeating.  Try not to multitask (for example, watch TV or use your computer) while eating. If it is time to eat, sit down at a table and enjoy your food. This will help you to know when you are full. It will also help you to be aware of what you are eating and how much you are eating. What are tips for following this plan? Reading food labels  Check the calorie count compared to the serving size. The serving size may be smaller than what you are used to  eating.  Check the source of the calories. Make sure the food you are eating is high in vitamins and protein and low in saturated and trans fats. Shopping  Read nutrition labels while you shop. This will help you make healthy decisions before you decide to purchase your food.  Make a grocery list and stick to it. Cooking  Try to cook your favorite foods in a healthier way. For example, try baking instead of frying.  Use low-fat dairy products. Meal planning  Use more fruits and vegetables. Half of your plate should   be fruits and vegetables.  Include lean proteins like poultry and fish. How do I count calories when eating out?  Ask for smaller portion sizes.  Consider sharing an entree and sides instead of getting your own entree.  If you get your own entree, eat only half. Ask for a box at the beginning of your meal and put the rest of your entree in it so you are not tempted to eat it.  If calories are listed on the menu, choose the lower calorie options.  Choose dishes that include vegetables, fruits, whole grains, low-fat dairy products, and lean protein.  Choose items that are boiled, broiled, grilled, or steamed. Stay away from items that are buttered, battered, fried, or served with cream sauce. Items labeled "crispy" are usually fried, unless stated otherwise.  Choose water, low-fat milk, unsweetened iced tea, or other drinks without added sugar. If you want an alcoholic beverage, choose a lower calorie option such as a glass of wine or light beer.  Ask for dressings, sauces, and syrups on the side. These are usually high in calories, so you should limit the amount you eat.  If you want a salad, choose a garden salad and ask for grilled meats. Avoid extra toppings like bacon, cheese, or fried items. Ask for the dressing on the side, or ask for olive oil and vinegar or lemon to use as dressing.  Estimate how many servings of a food you are given. For example, a serving of  cooked rice is  cup or about the size of half a baseball. Knowing serving sizes will help you be aware of how much food you are eating at restaurants. The list below tells you how big or small some common portion sizes are based on everyday objects: ? 1 oz-4 stacked dice. ? 3 oz-1 deck of cards. ? 1 tsp-1 die. ? 1 Tbsp- a ping-pong ball. ? 2 Tbsp-1 ping-pong ball. ?  cup- baseball. ? 1 cup-1 baseball. Summary  Calorie counting means keeping track of how many calories you eat and drink each day. If you eat fewer calories than your body needs, you should lose weight.  A healthy amount of weight to lose per week is usually 1-2 lb (0.5-0.9 kg). This usually means reducing your daily calorie intake by 500-750 calories.  The number of calories in a food can be found on a Nutrition Facts label. If a food does not have a Nutrition Facts label, try to look up the calories online or ask your dietitian for help.  Use your calories on foods and drinks that will fill you up, and not on foods and drinks that will leave you hungry.  Use smaller plates, glasses, and bowls to prevent overeating. This information is not intended to replace advice given to you by your health care provider. Make sure you discuss any questions you have with your health care provider. Document Released: 11/12/2005 Document Revised: 10/12/2016 Document Reviewed: 10/12/2016 Elsevier Interactive Patient Education  2018 [employer]  Constipation, Adult Constipation is when a person:  Poops (has a bowel movement) fewer times in a week than normal.  Has a hard time pooping.  Has poop that is dry, hard, or bigger than normal.  Follow these instructions at home: Eating and drinking   Eat foods that have a lot of fiber, such as: ? Fresh fruits and vegetables. ? Whole grains. ? Beans.  Eat less of foods that are high in fat, low in fiber, or overly processed, such  as: ? [place]  fries. ? Hamburgers. ? Cookies. ? Candy. ? Soda.  Drink enough fluid to keep your pee (urine) clear or pale yellow. General instructions  Exercise regularly or as told by your doctor.  Go to the restroom when you feel like you need to poop. Do not hold it in.  Take over-the-counter and prescription medicines only as told by your doctor. These include any fiber supplements.  Do pelvic floor retraining exercises, such as: ? Doing deep breathing while relaxing your lower belly (abdomen). ? Relaxing your pelvic floor while pooping.  Watch your condition for any changes.  Keep all follow-up visits as told by your doctor. This is important. Contact a doctor if:  You have pain that gets worse.  You have a fever.  You have not pooped for 4 days.  You throw up (vomit).  You are not hungry.  You lose weight.  You are bleeding from the anus.  You have thin, pencil-like poop (stool). Get help right away if:  You have a fever, and your symptoms suddenly get worse.  You leak poop or have blood in your poop.  Your belly feels hard or bigger than normal (is bloated).  You have very bad belly pain.  You feel dizzy or you faint. This information is not intended to replace advice given to you by your health care provider. Make sure you discuss any questions you have with your health care provider. Document Released: 04/30/2008 Document Revised: 06/01/2016 Document Reviewed: 05/02/2016 Elsevier Interactive Patient Education  2018 [employer].

## 2018-05-07 NOTE — Progress Notes (Signed)
Subjective:  Patient ID: Kendra Vega, female    DOB: 05-07-1971  Age: 47 y.o. 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 Her A1c is 6.7 today in keeping with a new diagnosis of Type 2 Diabetes Mellitus; it was 5.7 previously. She has been compliant with her antihypertensive and statin and denies bruising from plavix. I had placed her on Topamax previously for post stroke headaches which she states has resolved at this time. Her B[facility] disorder [facility] and she denies suicidal ideations or intents. She complains of constipation but no nausea, vomiting or abdominal pain but does have bloating.  Past Medical History:  Diagnosis Date  . 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 Breast mass    rt breast ?abcess x's 1 week  . Hypertension   . Stroke [facility])     No past surgical history on file.  No Known Allergies   Outpatient Medications Prior to Visit  Medication Sig Dispense Refill  . acetaminophen (TYLENOL) 500 MG tablet Take 1,000 mg by mouth every 6 (six) hours as needed.    Marland Kitchen aspirin EC 325 MG EC tablet Take 1 tablet (325 mg total) by mouth daily. 30 tablet 0  . citalopram (CELEXA) 20 MG tablet Take 1 tablet (20 mg total) by mouth daily. 30 tablet 6  . cyclobenzaprine (FLEXERIL) 10 MG tablet TAKE 1 TABLET BY MOUTH 2 (TWO) TIMES DAILY AS NEEDED FOR MUSCLE SPASMS. 30 tablet 2  . hydrOXYzine (ATARAX/VISTARIL) 25 MG tablet Take 1 tablet (25 mg total) by mouth 3 (three) times daily. 90 tablet 6  . lurasidone (LATUDA) 20 MG TABS tablet Take 1 tablet (20 mg total) by mouth at bedtime. 60 tablet 6  . atorvastatin (LIPITOR) 20 MG  tablet Take 1 tablet (20 mg total) by mouth daily at 6 PM. 30 tablet 6  . clopidogrel (PLAVIX) 75 MG tablet Take 1 tablet (75 mg total) by mouth daily. 30 tablet 6  . lisinopril-hydrochlorothiazide (PRINZIDE,ZESTORETIC) 20-12.5 MG tablet Take 2 tablets by mouth daily. 60 tablet 6  . metoprolol tartrate (LOPRESSOR) 50 MG tablet Take 1 tablet (50 mg total) by mouth 2 (two) times daily. 60 tablet 6  . topiramate (TOPAMAX) 50 MG tablet Take 1 tablet (50 mg total) by mouth 2 (two) times daily. 60 tablet 3  . metroNIDAZOLE (FLAGYL) 500 MG tablet Take 1 tablet (500 mg total) by mouth 2 (two) times daily. (Patient not taking: Reported on 08/23/2017) 14 tablet 0  . nicotine (NICODERM CQ - DOSED IN MG/24 HOURS) 21 mg/24hr patch Place 1 patch (21 mg total) onto the skin daily. (Patient not taking: Reported on 10/22/2017) 28 patch 0  . amoxicillin-clavulanate (AUGMENTIN) 875-125 MG tablet Take 1 tablet by mouth every 12 (twelve) hours. (Patient not taking: Reported on 08/23/2017) 20 tablet 0   No facility-administered medications prior to visit.     ROS Review of Systems  Constitutional: Negative for activity change, appetite change and fatigue.  HENT: Negative for congestion, sinus pressure and sore throat.   Eyes: Positive for visual disturbance.  Respiratory: Negative for cough, chest tightness, shortness of breath and wheezing.  Cardiovascular: Negative for chest pain and palpitations.  Gastrointestinal: Positive for constipation. Negative for abdominal distention and abdominal pain.  Endocrine: Negative for polydipsia.  Genitourinary: Negative for dysuria and frequency.  Musculoskeletal: Negative for arthralgias and back pain.  Skin: Negative for rash.  Neurological: Negative for tremors, light-headedness and numbness.  Hematological: Does not bruise/bleed easily.  Psychiatric/Behavioral: Negative for agitation and behavioral problems.    Objective:  BP (!) 142/88   Pulse 95   Temp (!) 97.5  F (36.4 C) (Oral)   Ht 5\' 5"  (1.651 m)   Wt 260 lb 12.8 oz (118.3 kg)   SpO2 95%   BMI 43.40 kg/m   BP/Weight 05/07/2018 02/04/2018 10/22/2017  Systolic BP 142 131 148  Diastolic BP 88 85 89  Wt. (Lbs) 260.8 260 245.4  BMI 43.4 43.27 40.84      Physical Exam  Constitutional: She is oriented to person, place, and time. She appears well-developed and well-nourished.  Cardiovascular: Normal rate, normal heart sounds and intact distal pulses.  No murmur heard. Pulmonary/Chest: Effort normal and breath sounds normal. She has no wheezes. She has no rales. She exhibits no tenderness.  Abdominal: Soft. Bowel sounds are normal. She exhibits no distension and no mass. There is no tenderness.  Musculoskeletal: Normal range of motion.  Neurological: She is alert and oriented to person, place, and time.  Skin: Skin is warm and dry.  Psychiatric: She has a normal mood and affect.     Assessment & Plan:   1. Essential hypertension Slightly elevated No regimen change today Counseled on blood pressure goal of less than 130/80, low-sodium, DASH diet, medication compliance, 150 minutes of moderate intensity exercise per week. Discussed medication compliance, adverse effects. - metoprolol tartrate (LOPRESSOR) 50 MG tablet; Take 1 tablet (50 mg total) by mouth 2 (two) times daily.  Dispense: 60 tablet; Refill: 6 - lisinopril-hydrochlorothiazide (PRINZIDE,ZESTORETIC) 20-12.5 MG tablet; Take 2 tablets by mouth daily.  Dispense: 60 tablet; Refill: 6  2. Type 2 diabetes mellitus without complication, without long-term current use of insulin (HCC) Newly diagnosed with A1c of 6.7 [place] Metformin Clinical pharmacist called in for education Counseled on Diabetic diet, my plate method, [id number] minutes of moderate intensity exercise/week Keep blood sugar logs with fasting goals of 80-120 mg/dl, random of less than [id number] and in the event of sugars less than 60 mg/dl or greater than 045400 mg/dl please notify  the clinic ASAP. It is recommended that you undergo annual eye exams and annual foot exams. Pneumonia vaccine is recommended. Diabetic health maintenance at next visit - POCT glycosylated hemoglobin (Hb A1C) - metFORMIN (GLUCOPHAGE) 500 MG tablet; Take 2 tablets (1,000 mg total) by mouth 2 (two) times daily with a meal.  Dispense: 120 tablet; Refill: 6 - glucose blood (TRUE METRIX BLOOD GLUCOSE TEST) test strip; Use once daily  Dispense: 100 each; Refill: 12 - Blood Glucose Monitoring Suppl (TRUE METRIX METER) DEVI; 1 each by Does not apply route daily.  Dispense: 1 Device; Refill: 0 - TRUEPLUS LANCETS 28G MISC; 1 each by Does not apply route daily.  Dispense: 100 each; Refill: 12  3. History of CVA (cerebrovascular accident) No residual deficits Risk factor modifications - clopidogrel (PLAVIX) 75 MG tablet; Take 1 tablet (75 mg total) by mouth daily.  Dispense: 30 tablet; Refill: 6 - atorvastatin (LIPITOR) 20 MG tablet; Take 1 tablet (20 mg total) by mouth daily at 6 PM.  Dispense: 30 tablet; Refill: 6  4. Bipolar depression (HCC) Stable Management as per  [place]  5. Class 3 severe obesity due to excess calories without serious comorbidity with body mass index (BMI) of 40.0 to 44.9 in adult [facility]) Has been unable to loose weight ever since she gained 40lbs after smoking cessation Discussed cutting back portions, increasing physical activity  6. Other constipation Increase fiber intake - polyethylene glycol powder (GLYCOLAX/MIRALAX) powder; Take 17 g by mouth daily.  Dispense: 3350 g; Refill: 1   Meds ordered this encounter  Medications  . metoprolol tartrate (LOPRESSOR) 50 MG tablet    Sig: Take 1 tablet (50 mg total) by mouth 2 (two) times daily.    Dispense:  60 tablet    Refill:  6  . lisinopril-hydrochlorothiazide (PRINZIDE,ZESTORETIC) 20-12.5 MG tablet    Sig: Take 2 tablets by mouth daily.    Dispense:  60 tablet    Refill:  6  . clopidogrel (PLAVIX) 75 MG tablet    Sig:  Take 1 tablet (75 mg total) by mouth daily.    Dispense:  30 tablet    Refill:  6  . atorvastatin (LIPITOR) 20 MG tablet    Sig: Take 1 tablet (20 mg total) by mouth daily at 6 PM.    Dispense:  30 tablet    Refill:  6  . polyethylene glycol powder (GLYCOLAX/MIRALAX) powder    Sig: Take 17 g by mouth daily.    Dispense:  3350 g    Refill:  1  . metFORMIN (GLUCOPHAGE) 500 MG tablet    Sig: Take 2 tablets (1,000 mg total) by mouth 2 (two) times daily with a meal.    Dispense:  120 tablet    Refill:  6  . glucose blood (TRUE METRIX BLOOD GLUCOSE TEST) test strip    Sig: Use once daily    Dispense:  100 each    Refill:  12  . Blood Glucose Monitoring Suppl (TRUE METRIX METER) DEVI    Sig: 1 each by Does not apply route daily.    Dispense:  1 Device    Refill:  0  . TRUEPLUS LANCETS 28G MISC    Sig: 1 each by Does not apply route daily.    Dispense:  100 each    Refill:  12    Follow-up: Return in about 3 months (around 08/07/2018) for Follow-up of chronic medical conditions.   Hoy Register MD

## 2018-05-08 MED FILL — metFORMIN HCL 500 MG TABS: 500 | 30 days supply | Qty: 120 | Fill #0

## 2018-05-09 ENCOUNTER — Encounter: Payer: Self-pay | Admitting: Family Medicine

## 2018-05-14 MED FILL — CYCLOBENZAPRINE 10 MG TAB: 10 | 15 days supply | Qty: 30 | Fill #1

## 2018-05-14 MED FILL — LISINOPRIL-HCTZ 20-12.5 MG: 20-12.5 | 30 days supply | Qty: 60 | Fill #3

## 2018-05-27 MED FILL — ATORVASTATIN 20 MG TABLET: 20 | 30 days supply | Qty: 30 | Fill #3

## 2018-05-27 MED FILL — METOPROLOL TARTRATE 50 MG T: 50 | 30 days supply | Qty: 60 | Fill #2

## 2018-06-11 MED FILL — LISINOPRIL-HCTZ 20-12.5 MG: 20-12.5 | 30 days supply | Qty: 60 | Fill #4

## 2018-06-11 MED FILL — CYCLOBENZAPRINE 10 MG TAB: 10 | 15 days supply | Qty: 30 | Fill #2

## 2018-06-30 MED FILL — ATORVASTATIN 20 MG TABLET: 20 | 30 days supply | Qty: 30 | Fill #4

## 2018-06-30 MED FILL — METOPROLOL TARTRATE 50 MG T: 50 | 30 days supply | Qty: 60 | Fill #3

## 2018-06-30 MED FILL — metFORMIN HCL 500 MG TABS: 500 | 30 days supply | Qty: 120 | Fill #1

## 2018-07-17 ENCOUNTER — Other Ambulatory Visit: Payer: Self-pay | Admitting: Family Medicine

## 2018-07-17 DIAGNOSIS — M62838 Other muscle spasm: Secondary | ICD-10-CM

## 2018-07-17 MED FILL — CYCLOBENZAPRINE 10 MG TAB: 10 | 15 days supply | Qty: 30 | Fill #0

## 2018-07-17 MED FILL — LISINOPRIL-HCTZ 20-12.5 MG: 20-12.5 | 30 days supply | Qty: 60 | Fill #5

## 2018-07-25 ENCOUNTER — Ambulatory Visit (INDEPENDENT_AMBULATORY_CARE_PROVIDER_SITE_OTHER): Payer: Self-pay | Admitting: Internal Medicine

## 2018-07-25 ENCOUNTER — Encounter: Payer: Self-pay | Admitting: Internal Medicine

## 2018-07-25 VITALS — BP 146/86 | HR 75 | Temp 98.3°F | Wt 258.3 lb

## 2018-07-25 DIAGNOSIS — R252 Cramp and spasm: Secondary | ICD-10-CM | POA: Insufficient documentation

## 2018-07-25 DIAGNOSIS — G4730 Sleep apnea, unspecified: Secondary | ICD-10-CM

## 2018-07-25 DIAGNOSIS — R51 Headache: Secondary | ICD-10-CM

## 2018-07-25 DIAGNOSIS — Z79899 Other long term (current) drug therapy: Secondary | ICD-10-CM

## 2018-07-25 DIAGNOSIS — Z720 Tobacco use: Secondary | ICD-10-CM

## 2018-07-25 DIAGNOSIS — I10 Essential (primary) hypertension: Secondary | ICD-10-CM

## 2018-07-25 DIAGNOSIS — R3129 Other microscopic hematuria: Secondary | ICD-10-CM

## 2018-07-25 DIAGNOSIS — R0683 Snoring: Secondary | ICD-10-CM

## 2018-07-25 MED ORDER — SPIRONOLACTONE 25 MG PO TABS: 25.0000 mg | ORAL_TABLET | Freq: Every day | ORAL | 3 refills | Status: DC

## 2018-07-25 MED FILL — SPIRONOLACTONE 25 MG TABLET: 25 | 30 days supply | Qty: 30 | Fill #0

## 2018-07-25 NOTE — Patient Instructions (Addendum)
[name],   For your blood pressure start taking spironolactone 25 mg daily (1 tablet).  We also got blood work to see what was the cause of your high blood pressure.  Come back in 4 weeks to recheck your blood pressure.  For your muscle cramps were also check your electrolytes.  We will give you a call with the results of these labs.  [facility].  Please call us if you have any questions or concerns.   - Dr. Evelene CroonSantos

## 2018-07-25 NOTE — Progress Notes (Signed)
CC: HTN follow up and muscle cramps   HPI:  [name] is a 47 y.o. female with PMH listed below who presents to clinic for HTN follow up.   HTN, uncontrolled: [name] has a history of uncontrolled HTN and presents today for follow up. She is on lisinopril-HCTZ 40-25 mg QD and metoprolol 50 mg BID. She reports compliance with her medications. BP today 156/78 and 146/86. 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 Differential diagnosis includes progression of CKD in the setting of uncontrolled HTN, renal artery stenosis (atherosclerotic vs FMD, positive FH in mother) primary aldosteronism, Cushing's syndrome, hypo/hyperthyroid, and OSA (does report snoring, apneic episodes, and morning headaches). Low suspicion for pheochromocytoma. Does use marijuana, but denies use of stimulants and NSAIDs. No recent changes in medications. Unfortunately patient is uninsured and will do limited workup today as below. Has appt with Kendra Vega for orange card application.  - BMP, CK, UA, aldosterone/renin ratio  - Will need sleep study for OSA evaluation  - START spironolactone 25 mg QD (normal K on previous labs) - Follow up in 4 weeks for BP, will also need repeat BMP   ADDENDUM: Renal function worsening compared to 4 months ago, Cr 1.28-->1.71 with GFR 40. UA also showed hematuria. No dysmorphic RBCs or casts seen. No protein, leukocytes or nitrites. CK normal. No signs/symptoms of UTI. Denies occupation exposures. Attempted to call patient x2 to discuss results but unable to reach.  - Will need information about LMP as hematuria can be explained by menses. If patient on her menstrual period, will need repeat UA to ensure resolution of hematuria  - Will continue current anti-hypertensive regimen as GFR > 30. Will need close monitoring though and I recommend follow up in 2 weeks vs initial recommendation  in 4 weeks.   - Hematuria is microscopic in nature. She is > 58 yo and a smoker, and will therefore require further evaluation with CT abd/pelvis w/wo contrast for urography and urology referral for cystoscopy. Unfortunately, patient currently uninsured. She will also need a pregnancy test to rule out pregnancy as she is of childbearing age.   Muscle cramps: Patient presents complaining of diffuse muscle cramps that started about 4 months ago. She was prescribed flexeril by previous PCP, but it has not been helping. On review of chart, patient has been experiencing muscle cramping since 2014. This used to occur at night and were located at bilateral LE and hips, but she now states they occur throughout the day and they are painful. She denies weakness, association with activity, and recent falls/trauma. Current medication not associated with similar side effect. Will check electrolytes.  - Follow up BMP, Mag, and CK    ADDENDUM: Sodium, potassium, calcium and magnesium all normal. Could check TSH, CBC, iron studies, Vit D and B12.   Past Medical History:  Diagnosis Date  . 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 Breast mass    rt breast ?abcess x's 1 week  . Hypertension   . Stroke [facility])    PSH: none   FH:  Mother - HTN, DM, RAS  Father - HTN  Maternal grandmother - uterine cancer   SH: Smoke 4 cigarettes currenlty, but used to smoke 1 ppd x 20 years. Denies alcohol use. Reports marijuana about once a month. Denies any other drug use.   Review of Systems:   Review of Systems  Constitutional: Negative for chills, fever, malaise/fatigue and weight loss.  Gastrointestinal: Negative for abdominal pain, blood in stool, constipation, diarrhea, melena, nausea and vomiting.  Genitourinary: Negative for dysuria, frequency, hematuria and urgency.  Musculoskeletal:       Diffuse muscle cramps  Neurological: Positive for headaches. Negative for weakness.    Physical Exam: Vitals:    07/25/18 1316 07/25/18 1345  BP: (!) 156/78 (!) 146/86  Pulse: 80 75  Temp: 98.3 F (36.8 C)   TempSrc: Oral   SpO2: 97%   Weight: 258 lb 4.8 oz (117.2 kg)    General: obese, well-appearing female, pleasant, in NAD  CV:RRR, nl S1/S2, no mrg  Pulm: CTAB, no wheezes or crackles, no increased WOB  Abd: obese bu soft, NTND, normoactive BS Ext: warm and well perfused without cyanosis or edema  Neuro: A&Ox3, strength and sensation intact in all 4 extremities   Assessment & Plan:   See Encounters Tab for problem based charting.  Patient discussed with Dr. Sandre Kittyaines

## 2018-07-26 LAB — BMP8+ANION GAP
Anion Gap: 14 mmol/L (ref 10.0–18.0)
BUN/Creatinine Ratio: 7 — ABNORMAL LOW (ref 9–23)
BUN: 12 mg/dL (ref 6–24)
CO2: 29 mmol/L (ref 20–29)
Calcium: 9.9 mg/dL (ref 8.7–10.2)
Chloride: 98 mmol/L (ref 96–106)
Creatinine, Ser: 1.71 mg/dL — ABNORMAL HIGH (ref 0.57–1.00)
GFR calc Af Amer: 40 mL/min/{1.73_m2} — ABNORMAL LOW (ref >59)
GFR calc non Af Amer: 35 mL/min/{1.73_m2} — ABNORMAL LOW (ref >59)
Glucose: 92 mg/dL (ref 65–99)
Potassium: 3.8 mmol/L (ref 3.5–5.2)
Sodium: 141 mmol/L (ref 134–144)

## 2018-07-26 LAB — URINALYSIS, ROUTINE W REFLEX MICROSCOPIC
Bilirubin, UA: NEGATIVE
Glucose, UA: NEGATIVE
Ketones, UA: NEGATIVE
Leukocytes, UA: NEGATIVE
Nitrite, UA: NEGATIVE
Protein, UA: NEGATIVE
Specific Gravity, UA: 1.017 (ref 1.005–1.030)
Urobilinogen, Ur: 0.2 mg/dL (ref 0.2–1.0)
pH, UA: 6 (ref 5.0–7.5)

## 2018-07-26 LAB — MICROSCOPIC EXAMINATION
Casts: NONE SEEN /lpf
Epithelial Cells (non renal): >10 /hpf — AB (ref 0–10)

## 2018-07-26 LAB — MAGNESIUM: Magnesium: 1.8 mg/dL (ref 1.6–2.3)

## 2018-07-26 LAB — CK: Total CK: 153 U/L (ref 24–173)

## 2018-07-28 ENCOUNTER — Encounter: Payer: Self-pay | Admitting: Internal Medicine

## 2018-07-28 NOTE — Assessment & Plan Note (Signed)
HTN, uncontrolled: [name] has a history of uncontrolled HTN and presents today for follow up. She is on lisinopril-HCTZ 40-25 mg QD and metoprolol 50 mg BID. She reports compliance with her medications. BP today 156/78 and 146/86. 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 Differential diagnosis includes progression of CKD in the setting of uncontrolled HTN, renal artery stenosis (atherosclerotic vs FMD, positive FH in mother) primary aldosteronism, Cushing's syndrome, hypo/hyperthyroid, and OSA (does report snoring, apneic episodes, and morning headaches). Low suspicion for pheochromocytoma. Does use marijuana, but denies use of stimulants and NSAIDs. No recent changes in medications. Unfortunately patient is uninsured and will do limited workup today as below. Has appt with Kendra Vega for orange card application.  - BMP, CK, UA, aldosterone/renin ratio  - Will need sleep study for OSA evaluation  - START spironolactone 25 mg QD (normal K on previous labs) - Follow up in 4 weeks for BP, will also need repeat BMP   ADDENDUM: Renal function worsening compared to 4 months ago, Cr 1.28-->1.71 with GFR 40. UA also showed hematuria. No dysmorphic RBCs or casts seen. No protein, leukocytes or nitrites. CK normal. No signs/symptoms of UTI. Denies occupation exposures. Attempted to call patient x2 to discuss results but unable to reach.  - Will need information about LMP as hematuria can be explained by menses. If patient on her menstrual period, will need repeat UA to ensure resolution of hematuria  - Will continue current anti-hypertensive regimen as GFR > 30. Will need close monitoring though and I recommend follow up in 2 weeks vs initial recommendation in 4 weeks.   - Hematuria is microscopic in nature. She is > 19 yo and a smoker, and will therefore require further evaluation with CT abd/pelvis w/wo  contrast for urography and urology referral for cystoscopy. Unfortunately, patient currently uninsured. She will also need a pregnancy test to rule out pregnancy as she is of childbearing age.

## 2018-07-28 NOTE — Assessment & Plan Note (Signed)
Muscle cramps: Patient presents complaining of diffuse muscle cramps that started about 4 months ago. She was prescribed flexeril by previous PCP, but it has not been helping. On review of chart, patient has been experiencing muscle cramping since 2014. This used to occur at night and were located at bilateral LE and hips, but she now states they occur throughout the day and they are painful. She denies weakness, association with activity, and recent falls/trauma. Current medication not associated with similar side effect. Will check electrolytes.  - Follow up BMP, Mag, and CK    ADDENDUM: Sodium, potassium, calcium and magnesium all normal. Could check TSH, CBC, iron studies, Vit D and B12.

## 2018-07-30 LAB — ALDOSTERONE + RENIN ACTIVITY W/ RATIO
ALDOS/RENIN RATIO: 10.9 (ref 0.0–30.0)
ALDOSTERONE: 9.6 ng/dL (ref 0.0–30.0)
Renin: 0.88 ng/mL/hr (ref 0.167–5.380)

## 2018-07-31 MED FILL — ATORVASTATIN 20 MG TABLET: 20 | 30 days supply | Qty: 30 | Fill #5

## 2018-07-31 MED FILL — METOPROLOL TARTRATE 50 MG T: 50 | 30 days supply | Qty: 60 | Fill #4

## 2018-07-31 NOTE — Progress Notes (Signed)
Internal Medicine Clinic Attending  Case discussed with Dr. Santos-Sanchez at the time of the visit.  We reviewed the resident's history and exam and pertinent patient test results.  I agree with the assessment, diagnosis, and plan of care documented in the resident's note.  Alexander Raines, M.D., Ph.D.  

## 2018-08-12 ENCOUNTER — Ambulatory Visit: Payer: Self-pay | Admitting: Family Medicine

## 2018-08-21 ENCOUNTER — Encounter: Payer: Self-pay | Admitting: Internal Medicine

## 2018-08-21 ENCOUNTER — Ambulatory Visit: Payer: Self-pay

## 2018-08-21 ENCOUNTER — Other Ambulatory Visit: Payer: Self-pay

## 2018-08-21 ENCOUNTER — Ambulatory Visit (INDEPENDENT_AMBULATORY_CARE_PROVIDER_SITE_OTHER): Payer: Self-pay | Admitting: Internal Medicine

## 2018-08-21 VITALS — BP 165/100 | HR 80 | Temp 98.1°F | Ht 65.0 in | Wt 253.2 lb

## 2018-08-21 DIAGNOSIS — N183 Chronic kidney disease, stage 3 unspecified: Secondary | ICD-10-CM

## 2018-08-21 DIAGNOSIS — Z79899 Other long term (current) drug therapy: Secondary | ICD-10-CM

## 2018-08-21 DIAGNOSIS — I10 Essential (primary) hypertension: Secondary | ICD-10-CM

## 2018-08-21 DIAGNOSIS — R3129 Other microscopic hematuria: Secondary | ICD-10-CM

## 2018-08-21 DIAGNOSIS — E785 Hyperlipidemia, unspecified: Secondary | ICD-10-CM

## 2018-08-21 DIAGNOSIS — I129 Hypertensive chronic kidney disease with stage 1 through stage 4 chronic kidney disease, or unspecified chronic kidney disease: Secondary | ICD-10-CM

## 2018-08-21 DIAGNOSIS — R252 Cramp and spasm: Secondary | ICD-10-CM

## 2018-08-21 DIAGNOSIS — M62838 Other muscle spasm: Secondary | ICD-10-CM

## 2018-08-21 DIAGNOSIS — Z8673 Personal history of transient ischemic attack (TIA), and cerebral infarction without residual deficits: Secondary | ICD-10-CM

## 2018-08-21 MED ORDER — AMLODIPINE BESYLATE 5 MG PO TABS: 5.0000 mg | ORAL_TABLET | Freq: Every day | ORAL | 2 refills | Status: DC

## 2018-08-21 MED ORDER — METHOCARBAMOL 500 MG PO TABS: 1000.0000 mg | ORAL_TABLET | Freq: Three times a day (TID) | ORAL | 2 refills | Status: DC | PRN

## 2018-08-21 MED FILL — LISINOPRIL-HCTZ 20-12.5 MG: 20-12.5 | 30 days supply | Qty: 60 | Fill #6

## 2018-08-21 MED FILL — AMLODIPINE BESYLATE 5 MG TA: 5 | 30 days supply | Qty: 30 | Fill #0

## 2018-08-21 NOTE — Progress Notes (Signed)
   CC: 1 month follow-up  HPI:  Kendra Vega is a 47 y.o. female with a history of HTN, CKD, HLD, hx of CVA and CRAO who presents for 1 month follow-up.  Hypertension Kendra Vega has a long history of uncontrolled hypertension. She was on lisinopril-HCTZ 40-25 mg QD and metoprolol 50 mg BID. Her blood pressure at her last clinic visit on 07/28/18 was elevated at 146-156/78-86. There was concern for resistant HTN from a secondary cause. Labs (BMP, CK, UA, aldosterone/renin ratio) was significant for worsening renal function (Cr 1.28-->1.71 with GFR 40), UA positive for hematuria without dysmorphic RBCs or casts, prtein, leukocytes, or nitrites.  Sleep study for OSA was ordered. She was started on spironolactone 25 mg QD. She reports good compliance with lisinopril-HCTZ, metoprolol, and spironolactone. Her blood pressure today is 165/100.  CKD Kendra Vega has a history of chronic kidney disease with creatinine around 1.2-1.3 on average. She was found to have an elevated creatinine of 1.71 at last clinic visit 4 weeks ago.  Microscopic Hematuria Kendra Vega had a UA positive for hematuria without dysmorphic RBCs or casts, prtein, leukocytes, or nitrites 4 weeks ago. She states that she believes that she was on her period during this time. Today she is on the last day of her menstrual period.  She denies dysuria, urgency, frequency, hematuria, and flank pain. She denies having fevers at home.   Muscle Spasms Kendra Vega reports a 1 year history of intermittent abdominal cramping and bilateral feet cramping. She states that the cramping happens periodically throughout the day and does not believe that it is worse during any particular time of the day. She was prescribed cyclobenzaprine by her previous PCP and she states that this somewhat helped. She is currently out of this medication and is now taking her sons cyclobenzaprine prescription.   Past Medical History:  Diagnosis Date  . 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 Breast mass    rt breast ?abcess x's 1 week  . Hypertension   . Stroke [facility])    Review of Systems:   Review of Systems  Constitutional: Negative for chills and fever.  HENT: Negative for sinus pain and sore throat.   Respiratory: Negative for cough and shortness of breath.   Cardiovascular: Negative for chest pain, palpitations and leg swelling.  Genitourinary: Negative for dysuria, flank pain, frequency, hematuria and urgency.  Musculoskeletal:       Bilateral toe cramping  Neurological: Negative for dizziness, weakness and headaches.    Physical Exam:  Vitals:   08/21/18 1507  BP: (!) 165/100  Pulse: 80  Temp: 98.1 F (36.7 C)  TempSrc: Oral  SpO2: 100%  Weight: 253 lb 3.2 oz (114.9 kg)  Height: 5\' 5"  (1.651 m)   Physical Exam  Constitutional: She appears well-developed and well-nourished.  HENT:  Head: Normocephalic and atraumatic.  Eyes: EOM are normal.  Neck: Normal range of motion.  Cardiovascular: Normal rate and regular rhythm.  Pulmonary/Chest: Effort normal and breath sounds normal.  Abdominal: Soft. Bowel sounds are normal. She exhibits no distension. There is no tenderness.  Musculoskeletal: She exhibits no edema or tenderness.  Neurological: She is alert.  Skin: Skin is warm and dry.  Psychiatric: She has a normal mood and affect. Her behavior is normal.  Nursing note and vitals reviewed.   Assessment & Plan:   See Encounters Tab for problem based charting.  Patient seen with Dr. Josem Kaufmann

## 2018-08-22 DIAGNOSIS — R3129 Other microscopic hematuria: Secondary | ICD-10-CM | POA: Insufficient documentation

## 2018-08-22 LAB — BMP8+ANION GAP
ANION GAP: 15 mmol/L (ref 10.0–18.0)
BUN/Creatinine Ratio: 8 — ABNORMAL LOW (ref 9–23)
BUN: 17 mg/dL (ref 6–24)
CALCIUM: 10.1 mg/dL (ref 8.7–10.2)
CO2: 22 mmol/L (ref 20–29)
Chloride: 101 mmol/L (ref 96–106)
Creatinine, Ser: 2.05 mg/dL — ABNORMAL HIGH (ref 0.57–1.00)
GFR calc non Af Amer: 28 mL/min/{1.73_m2} — ABNORMAL LOW (ref >59)
GFR, EST AFRICAN AMERICAN: 32 mL/min/{1.73_m2} — AB (ref >59)
GLUCOSE: 90 mg/dL (ref 65–99)
Potassium: 4.9 mmol/L (ref 3.5–5.2)
Sodium: 138 mmol/L (ref 134–144)

## 2018-08-22 LAB — PHOSPHORUS: PHOSPHORUS: 3.5 mg/dL (ref 2.5–4.5)

## 2018-08-22 LAB — TSH: TSH: 2.53 u[IU]/mL (ref 0.450–4.500)

## 2018-08-22 MED FILL — METHOCARBAMOL 500 MG TABS: 500 | 5 days supply | Qty: 30 | Fill #0

## 2018-08-22 NOTE — Assessment & Plan Note (Addendum)
Assessment: Ms. Rigdon blood pressure remains uncontrolled on lisinopril-HCTZ 40-25 mg QD, metoprolol 50 mg BID, and spiranolactone 25 mg QD. She was previously prescribed amlodipine but this was discontinued in March, 2019 because of pedal edema. She does not feel that stopping the amlodipine helped with the pedal edema.  Plan: 1. Restart Amlodipine 5 mg QD 2. Ordered a renal ultrasound to evaluate for fibromuscular dysplasia.   Addendum 08/22/18: Ms. Janney renal function continues to decline. Now 2.05. I called to inform Ms. Verley of her results and to ask her to discontinue use of her lisinopril-HCTZ and increase her dosage of Amlodipine to 10 mg QD. She expressed understanding and agreement.

## 2018-08-22 NOTE — Assessment & Plan Note (Signed)
Assessment: Kendra Vega has a several year history of chronic kidney disease likely secondary to her long-standing hypertension. Her BMP today showed worsening renal function abs (BMP, CK, UA, aldosterone/renin ratio) was significant for worsening renal function (Cr 1.28-->1.71-> 2.05 today).   Plan: 1. Discontinue lisinopril-HCTZ  2. Repeat BMP at 1 week follow-up

## 2018-08-22 NOTE — Assessment & Plan Note (Addendum)
Assessment: Kendra Vega has a 1 year history of intermittent abdominal and bilateral feet cramping of unclear etiology. Magnesium, TSH, and phosphorus levels are within normal limits. Her symptoms are not significantly relieved with cyclobenzaprine.   Plan: 1. Prescribed Methocarbamol 1000 mg q8h

## 2018-08-22 NOTE — Assessment & Plan Note (Addendum)
Assessment: Kendra Vega had a UA positive for hematuria without dysmorphic RBCs or casts, prtein, leukocytes, or nitrites 4 weeks ago. Differential includes hematuria from menstrual period and less likely nephrolithiasis, renal cancer, and bladder cancer. Kendra Vega is currently on her period today and will return to repeat the urinalysis. If she continues to have hematuria, I will cancel the renal ultrasound and order a CT stone study.  Plan: 1. Repeat UA  Addendum 08/30/2018: Repeat UA did not show signs of hematuria. I do believe her microscopic hematuria was likely 2/2 her menstrual period. I will keep the renal ultrasound so that we can further evaluate her resistant hypertension.

## 2018-08-26 NOTE — Progress Notes (Signed)
I saw and evaluated the patient.  I personally confirmed the key portions of Dr. Prince's history and exam and reviewed pertinent patient test results.  The assessment, diagnosis, and plan were formulated together and I agree with the documentation in the resident's note. 

## 2018-08-27 MED FILL — SPIRONOLACTONE 25 MG TABLET: 25 | 30 days supply | Qty: 30 | Fill #1

## 2018-08-28 ENCOUNTER — Other Ambulatory Visit: Payer: Self-pay

## 2018-08-28 DIAGNOSIS — R3129 Other microscopic hematuria: Secondary | ICD-10-CM

## 2018-08-29 LAB — URINALYSIS, ROUTINE W REFLEX MICROSCOPIC
BILIRUBIN UA: NEGATIVE
Glucose, UA: NEGATIVE
KETONES UA: NEGATIVE
Leukocytes, UA: NEGATIVE
NITRITE UA: NEGATIVE
RBC UA: NEGATIVE
SPEC GRAV UA: 1.019 (ref 1.005–1.030)
Urobilinogen, Ur: 0.2 mg/dL (ref 0.2–1.0)
pH, UA: 5 (ref 5.0–7.5)

## 2018-08-30 ENCOUNTER — Telehealth: Payer: Self-pay | Admitting: Internal Medicine

## 2018-08-30 NOTE — Telephone Encounter (Signed)
Kendra Vega urinalysis did not show signs of hematuria. I believe that her previous abnormal UA was likely caused by menstrual bleeding. I am however concerned about her rising creatinine and blood pressure while on amlodipine 10 mg QD. I asked her last week to d/c her lisinopril/HCTZ and increase her dose of amlodipine. I called Ms. Delahoz and left a message stating that she should make an appointment to be seen next week to recheck her kidney function and blood pressure. I have also sent a message to our front desk staff to have this appointment made.

## 2018-09-04 MED FILL — ATORVASTATIN 20 MG TABLET: 20 | 30 days supply | Qty: 30 | Fill #6

## 2018-09-04 MED FILL — METOPROLOL TARTRATE 50 MG T: 50 | 30 days supply | Qty: 60 | Fill #5

## 2018-09-05 ENCOUNTER — Encounter: Payer: Self-pay | Admitting: Internal Medicine

## 2018-09-05 ENCOUNTER — Other Ambulatory Visit: Payer: Self-pay

## 2018-09-05 ENCOUNTER — Ambulatory Visit (INDEPENDENT_AMBULATORY_CARE_PROVIDER_SITE_OTHER): Payer: Self-pay | Admitting: Internal Medicine

## 2018-09-05 VITALS — BP 170/92 | HR 103 | Temp 98.5°F | Ht 65.0 in | Wt 256.1 lb

## 2018-09-05 DIAGNOSIS — N183 Chronic kidney disease, stage 3 unspecified: Secondary | ICD-10-CM

## 2018-09-05 DIAGNOSIS — I10 Essential (primary) hypertension: Secondary | ICD-10-CM

## 2018-09-05 MED ORDER — AMLODIPINE BESYLATE 5 MG PO TABS: 10.0000 mg | ORAL_TABLET | Freq: Every day | ORAL | 2 refills | Status: DC

## 2018-09-05 MED FILL — AMLODIPINE BESYLATE 5 MG TA: 5 | 30 days supply | Qty: 60 | Fill #0

## 2018-09-05 NOTE — Progress Notes (Signed)
Internal Medicine Clinic Attending  I saw and evaluated the patient.  I personally confirmed the key portions of the history and exam documented by Dr. Bloomfield and I reviewed pertinent patient test results.  The assessment, diagnosis, and plan were formulated together and I agree with the documentation in the resident's note.  

## 2018-09-05 NOTE — Assessment & Plan Note (Addendum)
Blood pressure 170/92 today. She remains asymptomatic. Will increase Spironolactone to 50 mg today. Continue Metoprolol 50 mg BID and Amlodipine 50 mg.  Will follow-up in 4 weeks to re-check blood pressure and closely monitor potassium.   Patient is in the process of getting orange card and applying for disability. If she is able to get insurance, would order sleep study to evaluate for OSA as possible etiology for secondary HTN given history of snoring and reported apneic spells.

## 2018-09-05 NOTE — Patient Instructions (Signed)
Kendra Vega, It was a pleasure meeting you! I look forward to serving as your primary care doctor for the next few years! We are checking a few tests today to monitor your kidney function and electrolytes. I will call you when I have those results.   I will refill your Amlodipine as 10 mg to continue taking every day.  Start taking 2 of your Spironolactone pills every day to total 50 mg. When you are out of these, I will refill a new prescription as 50 mg.   You should be hearing about scheduling the ultrasound of your kidneys soon.   We will plan to bring you back in a month to follow-up on your blood pressure and kidney function. If you have any questions or concerns beforehand, please don't hesitate to call.   Look forward to seeing you again!

## 2018-09-05 NOTE — Progress Notes (Signed)
   CC: HTN  HPI:  Kendra Vega is a 47 y.o. female with PMHx of CKD, HTN who presents for follow-up of resistant HTN. At last visit on 9/26, patient's anti-hypertensive regimen was Lisinopril 20-HCTZ-12.5, Spironolactone 25, and Metoprolol 50 BID. Due to acutely worsening renal function, Lisinopril-HCTZ was discontinued and she was started on Amlodipine 10 mg. She does not take her blood pressure at home, but denies any headaches, vision changes, chest pain, palpitations, shortness of breath, or lower extremity edema.   She was complaining of intermittent muscle cramps and was started on Robaxin last visit which she states has worked really well to control her symptoms.   Past Medical History:  Diagnosis Date  . 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 Breast mass    rt breast ?abcess x's 1 week  . Hypertension   . 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  Patient seen with Dr. Oswaldo Done

## 2018-09-05 NOTE — Assessment & Plan Note (Signed)
Re-checking renal function today to see if there has been any improvement with holding Lisinopril.  Also checking urine microalbumin. Renal ultrasound has been ordered to evaluate for renovascular stenosis. She recalls mother having to have renal stent placed for HTN.  Will need to continue to closely monitor GFR and discontinue Metformin with GFR < 30. Will consider nephrology consult at next visit if renal function continues to decline.

## 2018-09-06 LAB — BMP8+ANION GAP
Anion Gap: 15 mmol/L (ref 10.0–18.0)
BUN / CREAT RATIO: 9 (ref 9–23)
BUN: 14 mg/dL (ref 6–24)
CO2: 22 mmol/L (ref 20–29)
Calcium: 9.5 mg/dL (ref 8.7–10.2)
Chloride: 101 mmol/L (ref 96–106)
Creatinine, Ser: 1.64 mg/dL — ABNORMAL HIGH (ref 0.57–1.00)
GFR calc Af Amer: 42 mL/min/{1.73_m2} — ABNORMAL LOW (ref >59)
GFR calc non Af Amer: 37 mL/min/{1.73_m2} — ABNORMAL LOW (ref >59)
GLUCOSE: 97 mg/dL (ref 65–99)
POTASSIUM: 4.6 mmol/L (ref 3.5–5.2)
SODIUM: 138 mmol/L (ref 134–144)

## 2018-09-06 LAB — CBC WITH DIFFERENTIAL/PLATELET
BASOS ABS: 0.1 10*3/uL (ref 0.0–0.2)
Basos: 1 %
EOS (ABSOLUTE): 0.2 10*3/uL (ref 0.0–0.4)
Eos: 2 %
HEMOGLOBIN: 11.4 g/dL (ref 11.1–15.9)
Hematocrit: 35.7 % (ref 34.0–46.6)
Immature Grans (Abs): 0 10*3/uL (ref 0.0–0.1)
Immature Granulocytes: 0 %
LYMPHS ABS: 4.3 10*3/uL — AB (ref 0.7–3.1)
Lymphs: 38 %
MCH: 25.2 pg — AB (ref 26.6–33.0)
MCHC: 31.9 g/dL (ref 31.5–35.7)
MCV: 79 fL (ref 79–97)
MONOS ABS: 0.6 10*3/uL (ref 0.1–0.9)
Monocytes: 5 %
Neutrophils Absolute: 6.2 10*3/uL (ref 1.4–7.0)
Neutrophils: 54 %
Platelets: 397 10*3/uL (ref 150–450)
RBC: 4.52 x10E6/uL (ref 3.77–5.28)
RDW: 17.5 % — ABNORMAL HIGH (ref 12.3–15.4)
WBC: 11.4 10*3/uL — AB (ref 3.4–10.8)

## 2018-09-06 LAB — MICROALBUMIN / CREATININE URINE RATIO
Creatinine, Urine: 307.4 mg/dL
Microalb/Creat Ratio: 12.1 mg/g creat (ref 0.0–30.0)
Microalbumin, Urine: 37.3 ug/mL

## 2018-09-12 MED FILL — METHOCARBAMOL 500 MG TABS: 500 | 5 days supply | Qty: 30 | Fill #1

## 2018-09-16 ENCOUNTER — Other Ambulatory Visit: Payer: Self-pay

## 2018-09-16 DIAGNOSIS — I10 Essential (primary) hypertension: Secondary | ICD-10-CM

## 2018-09-16 NOTE — Telephone Encounter (Signed)
spironolactone (ALDACTONE) 25 MG tablet, refill request @  [facility] - [place], [place] - [place] [address] [employer] [phone] (Phone) [phone] (Fax)

## 2018-09-17 MED ORDER — SPIRONOLACTONE 25 MG PO TABS: 50.0000 mg | ORAL_TABLET | Freq: Every day | ORAL | 1 refills | Status: DC

## 2018-09-17 NOTE — Telephone Encounter (Signed)
Pt stated Spironolactone was change to 50 mg daily; now she's out of medication - needs new rx sent for 50 mg. Thanks

## 2018-09-17 NOTE — Telephone Encounter (Signed)
Pt calling checking on medicine

## 2018-09-17 NOTE — Telephone Encounter (Signed)
Called pt - no answer; left message of refill. 

## 2018-09-18 ENCOUNTER — Encounter: Payer: Self-pay | Admitting: Internal Medicine

## 2018-09-18 MED FILL — SPIRONOLACTONE 25 MG TABLET: 25 | 30 days supply | Qty: 60 | Fill #0

## 2018-10-01 ENCOUNTER — Other Ambulatory Visit: Payer: Self-pay | Admitting: Internal Medicine

## 2018-10-06 MED FILL — AMLODIPINE BESYLATE 5 MG TA: 5 | 30 days supply | Qty: 60 | Fill #1

## 2018-10-06 MED FILL — METHOCARBAMOL 500 MG TABS: 500 | 5 days supply | Qty: 30 | Fill #2

## 2018-10-06 MED FILL — METOPROLOL TARTRATE 50 MG T: 50 | 30 days supply | Qty: 60 | Fill #6

## 2018-10-20 ENCOUNTER — Encounter (INDEPENDENT_AMBULATORY_CARE_PROVIDER_SITE_OTHER): Payer: Self-pay

## 2018-10-20 ENCOUNTER — Encounter: Payer: Self-pay | Admitting: Internal Medicine

## 2018-10-20 ENCOUNTER — Ambulatory Visit (INDEPENDENT_AMBULATORY_CARE_PROVIDER_SITE_OTHER): Payer: Self-pay | Admitting: Internal Medicine

## 2018-10-20 VITALS — BP 144/92 | HR 73 | Temp 98.0°F | Wt 257.0 lb

## 2018-10-20 DIAGNOSIS — E119 Type 2 diabetes mellitus without complications: Secondary | ICD-10-CM

## 2018-10-20 DIAGNOSIS — N183 Chronic kidney disease, stage 3 unspecified: Secondary | ICD-10-CM

## 2018-10-20 DIAGNOSIS — I10 Essential (primary) hypertension: Secondary | ICD-10-CM

## 2018-10-20 DIAGNOSIS — Z7984 Long term (current) use of oral hypoglycemic drugs: Secondary | ICD-10-CM

## 2018-10-20 DIAGNOSIS — I129 Hypertensive chronic kidney disease with stage 1 through stage 4 chronic kidney disease, or unspecified chronic kidney disease: Secondary | ICD-10-CM

## 2018-10-20 DIAGNOSIS — E1122 Type 2 diabetes mellitus with diabetic chronic kidney disease: Secondary | ICD-10-CM

## 2018-10-20 DIAGNOSIS — Z79899 Other long term (current) drug therapy: Secondary | ICD-10-CM

## 2018-10-20 DIAGNOSIS — Z8673 Personal history of transient ischemic attack (TIA), and cerebral infarction without residual deficits: Secondary | ICD-10-CM

## 2018-10-20 LAB — POCT GLYCOSYLATED HEMOGLOBIN (HGB A1C): Hemoglobin A1C: 6.2 % — AB (ref 4.0–5.6)

## 2018-10-20 LAB — GLUCOSE, CAPILLARY: GLUCOSE-CAPILLARY: 83 mg/dL (ref 70–99)

## 2018-10-20 MED ORDER — METFORMIN HCL 500 MG PO TABS: 500.0000 mg | ORAL_TABLET | Freq: Two times a day (BID) | ORAL | 6 refills | Status: DC

## 2018-10-20 MED ORDER — SPIRONOLACTONE 50 MG PO TABS: 50.0000 mg | ORAL_TABLET | Freq: Every day | ORAL | 11 refills | Status: DC

## 2018-10-20 MED ORDER — AMLODIPINE BESYLATE 10 MG PO TABS: 10.0000 mg | ORAL_TABLET | Freq: Every day | ORAL | 2 refills | Status: DC

## 2018-10-20 MED ORDER — ATORVASTATIN CALCIUM 20 MG PO TABS: 20.0000 mg | ORAL_TABLET | Freq: Every day | ORAL | 6 refills | Status: DC

## 2018-10-20 NOTE — Patient Instructions (Signed)
[name], It was a pleasure seeing you again! Today we discussed:  1. Blood pressure: it is looking much better! Continue taking the medications you are on. I will call and let you know if I am adding back the Lisinopril after I see how your kidney function is doing.   2. Diabetes: Please take 1 pill of the Metformin twice daily for your blood sugars.   We are checking your A1C and kidney function today. I will let you know these results when I have them and if any changes need to be made.  Otherwise, I'll see you back in about 3 months!  Hope you have a great holiday season! Take care, Dr. Chesley MiresBloomfield

## 2018-10-20 NOTE — Progress Notes (Signed)
   CC: HTN, CKD  HPI:  [name] is a 47 y.o. female with PMHx of HTN, type II DM, CKD III who presents for follow-up on HTN and CKD.   Past Medical History:  Diagnosis Date  . 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 Breast mass    rt breast ?abcess x's 1 week  . Hypertension   . Stroke [facility])    Review of Systems:   Constitutional: no fevers, chills, weight loss Cardiac: no chest pain, palpitations Pulm: no cough, shortness of breath GI: no abdominal pain, n/v, change in bowel movements Neuro: no headaches, vision changes, numbness/tingling, or weakness  Physical Exam:  Vitals:   10/20/18 1431 10/20/18 1443  BP: (!) 161/83 (!) 144/92  Pulse: 80 73  Temp: 98 F (36.7 C)   TempSrc: Oral   SpO2: 100%   Weight: 257 lb (116.6 kg)    General: well-developed, well-nourished pleasant female, appears stated age, NAD CV: RRR; no murmurs, rubs or gallops Pulm: normal respiratory effort; lungs CTA bilaterally Abd: BS+; abdomen is obese, non-tender, non-distended  Ext: no lower extremity edema  Assessment & Plan:   See Encounters Tab for problem based charting.  Patient seen with Dr. Cleda DaubE. Vega

## 2018-10-21 ENCOUNTER — Encounter: Payer: Self-pay | Admitting: Internal Medicine

## 2018-10-21 DIAGNOSIS — E119 Type 2 diabetes mellitus without complications: Secondary | ICD-10-CM

## 2018-10-21 DIAGNOSIS — E1121 Type 2 diabetes mellitus with diabetic nephropathy: Secondary | ICD-10-CM | POA: Insufficient documentation

## 2018-10-21 HISTORY — DX: Type 2 diabetes mellitus without complications: E11.9

## 2018-10-21 LAB — BMP8+ANION GAP
ANION GAP: 16 mmol/L (ref 10.0–18.0)
BUN / CREAT RATIO: 9 (ref 9–23)
BUN: 14 mg/dL (ref 6–24)
CO2: 21 mmol/L (ref 20–29)
CREATININE: 1.59 mg/dL — AB (ref 0.57–1.00)
Calcium: 9.9 mg/dL (ref 8.7–10.2)
Chloride: 97 mmol/L (ref 96–106)
GFR, EST AFRICAN AMERICAN: 44 mL/min/{1.73_m2} — AB (ref >59)
GFR, EST NON AFRICAN AMERICAN: 38 mL/min/{1.73_m2} — AB (ref >59)
Glucose: 87 mg/dL (ref 65–99)
Potassium: 4.7 mmol/L (ref 3.5–5.2)
Sodium: 134 mmol/L (ref 134–144)

## 2018-10-21 MED ORDER — LISINOPRIL 5 MG PO TABS: 5.0000 mg | ORAL_TABLET | Freq: Every day | ORAL | 2 refills | Status: DC

## 2018-10-21 MED FILL — SPIRONOLACTONE 50 MG TABLET: 50 | 30 days supply | Qty: 30 | Fill #0

## 2018-10-21 MED FILL — ATORVASTATIN 20 MG TABLET: 20 | 30 days supply | Qty: 30 | Fill #0

## 2018-10-21 NOTE — Assessment & Plan Note (Addendum)
BMP demonstrates stable renal function. Given history of microalbunuria, will initiate Lisinopril 5 mg and continue to monitor.

## 2018-10-21 NOTE — Assessment & Plan Note (Signed)
Well controlled with A1C of 6.2. Currently prescribed Metformin 1000 mg BID, but patient states she has only been taking it intermittently. Given labile GFR, will cut back to 500 mg BID.

## 2018-10-21 NOTE — Assessment & Plan Note (Addendum)
Blood pressure improved with current regimen, but still not at goal. She remains asymptomatic. BMP shows stabile renal function and potassium. Would benefit from ACE given history of DM and microalbuminuria.  - start Lisinopril 5 mg  - continue Spironolactone 50 mg - continue Amlodipine 10 mg - Continue Metoprolol 50 mg BID

## 2018-10-22 MED FILL — LISINOPRIL 5 MG TAB: 5 | 30 days supply | Qty: 30 | Fill #0

## 2018-10-22 NOTE — Progress Notes (Signed)
Internal Medicine Clinic Attending  I saw and evaluated the patient.  I personally confirmed the key portions of the history and exam documented by Dr. Bloomfield and I reviewed pertinent patient test results.  The assessment, diagnosis, and plan were formulated together and I agree with the documentation in the resident's note.  

## 2018-11-06 ENCOUNTER — Other Ambulatory Visit: Payer: Self-pay | Admitting: Family Medicine

## 2018-11-06 ENCOUNTER — Other Ambulatory Visit: Payer: Self-pay | Admitting: Internal Medicine

## 2018-11-06 DIAGNOSIS — I10 Essential (primary) hypertension: Secondary | ICD-10-CM

## 2018-11-06 DIAGNOSIS — R252 Cramp and spasm: Secondary | ICD-10-CM

## 2018-11-06 MED FILL — metFORMIN HCL 500 MG TABS: 500 | 30 days supply | Qty: 120 | Fill #2

## 2018-11-06 MED FILL — AMLODIPINE BESYLATE 5 MG TA: 5 | 30 days supply | Qty: 60 | Fill #2

## 2018-11-06 NOTE — Telephone Encounter (Signed)
[facility]?

## 2018-11-07 MED FILL — METHOCARBAMOL 500 MG TABS: 500 | 5 days supply | Qty: 30 | Fill #0

## 2018-11-10 ENCOUNTER — Other Ambulatory Visit: Payer: Self-pay | Admitting: Internal Medicine

## 2018-11-10 DIAGNOSIS — I10 Essential (primary) hypertension: Secondary | ICD-10-CM

## 2018-11-10 NOTE — Telephone Encounter (Signed)
Refill Request   metoprolol tartrate (LOPRESSOR) 50 MG tablet  [facility] - [place], [place] - [address]

## 2018-11-11 MED FILL — METOPROLOL TARTRATE 50 MG T: 50 | 30 days supply | Qty: 60 | Fill #0

## 2018-11-13 MED ORDER — METOPROLOL TARTRATE 50 MG PO TABS: 50.0000 mg | ORAL_TABLET | Freq: Two times a day (BID) | ORAL | 6 refills | Status: DC

## 2018-11-13 NOTE — Telephone Encounter (Signed)
I'm pretty sure I have a few patients using this pharmacy. Let me know if this needs to change? I approved refill request for Lopressor.  Thanks!

## 2018-11-20 MED FILL — SPIRONOLACTONE 50 MG TABLET: 50 | 30 days supply | Qty: 30 | Fill #1

## 2018-11-24 MED FILL — LISINOPRIL 5 MG TAB: 5 | 30 days supply | Qty: 30 | Fill #1

## 2018-11-28 MED FILL — SPIRONOLACTONE 50 MG TABLET: 50 | 30 days supply | Qty: 30 | Fill #1

## 2018-12-09 MED FILL — METHOCARBAMOL 500 MG TABS: 500 | 5 days supply | Qty: 30 | Fill #1

## 2018-12-09 MED FILL — AMLODIPINE BESYLATE 5 MG TA: 5 | 30 days supply | Qty: 30 | Fill #1

## 2018-12-09 MED FILL — ATORVASTATIN 20 MG TABLET: 20 | 30 days supply | Qty: 30 | Fill #1

## 2018-12-16 ENCOUNTER — Other Ambulatory Visit: Payer: Self-pay

## 2018-12-16 DIAGNOSIS — I10 Essential (primary) hypertension: Secondary | ICD-10-CM

## 2018-12-16 MED FILL — METOPROLOL TARTRATE 50 MG T: 50 | 30 days supply | Qty: 60 | Fill #1

## 2018-12-16 MED FILL — SPIRONOLACTONE 50 MG TABLET: 50 | 30 days supply | Qty: 30 | Fill #2

## 2018-12-16 NOTE — Telephone Encounter (Signed)
spironolactone (ALDACTONE) 50 MG tablet, REFILL REQUEST @  [facility] - [place], [place] - [place] [address] [employer] [phone] (Phone) [phone] (Fax)

## 2018-12-17 NOTE — Telephone Encounter (Signed)
This should have 11 refills according to prescription from November.

## 2018-12-22 ENCOUNTER — Ambulatory Visit: Payer: Self-pay

## 2018-12-26 MED FILL — AMLODIPINE BESYLATE 5 MG TA: 5 | 30 days supply | Qty: 30 | Fill #2

## 2018-12-26 MED FILL — LISINOPRIL 5 MG TAB: 5 | 30 days supply | Qty: 30 | Fill #2

## 2019-01-12 MED FILL — METHOCARBAMOL 500 MG TABS: 500 | 5 days supply | Qty: 30 | Fill #2

## 2019-01-12 MED FILL — SPIRONOLACTONE 50 MG TABLET: 50 | 30 days supply | Qty: 30 | Fill #3

## 2019-01-19 ENCOUNTER — Other Ambulatory Visit: Payer: Self-pay | Admitting: Internal Medicine

## 2019-01-19 DIAGNOSIS — I10 Essential (primary) hypertension: Secondary | ICD-10-CM

## 2019-01-19 MED FILL — ATORVASTATIN 20 MG TABLET: 20 | 30 days supply | Qty: 30 | Fill #2

## 2019-01-20 MED FILL — METOPROLOL TARTRATE 50 MG T: 50 | 30 days supply | Qty: 60 | Fill #2

## 2019-01-23 ENCOUNTER — Other Ambulatory Visit: Payer: Self-pay | Admitting: *Deleted

## 2019-01-23 DIAGNOSIS — I10 Essential (primary) hypertension: Secondary | ICD-10-CM

## 2019-01-24 MED ORDER — AMLODIPINE BESYLATE 10 MG PO TABS: 10.0000 mg | ORAL_TABLET | Freq: Every day | ORAL | 0 refills | Status: DC

## 2019-01-26 ENCOUNTER — Other Ambulatory Visit: Payer: Self-pay | Admitting: Internal Medicine

## 2019-01-26 ENCOUNTER — Encounter: Payer: Self-pay | Admitting: Internal Medicine

## 2019-01-26 DIAGNOSIS — I10 Essential (primary) hypertension: Secondary | ICD-10-CM

## 2019-01-26 NOTE — Telephone Encounter (Signed)
amLODipine (NORVASC) 10 MG tablet,   spironolactone (ALDACTONE) 50 MG tablet   lisinopril (PRINIVIL,ZESTRIL) 5 MG tablet, REFILL REQUEST @  [facility] - [place], [place] - [place] [address] [employer] [phone] (Phone) [phone] (Fax)

## 2019-01-27 MED FILL — LISINOPRIL 5 MG TAB: 5 | 30 days supply | Qty: 30 | Fill #0

## 2019-01-27 NOTE — Telephone Encounter (Signed)
Next appt scheduled  3/19 with PCP. 

## 2019-01-28 ENCOUNTER — Ambulatory Visit: Payer: Self-pay

## 2019-01-29 MED FILL — AMLODIPINE BESYLATE 10 MG T: 10 | 30 days supply | Qty: 30 | Fill #0

## 2019-02-02 ENCOUNTER — Other Ambulatory Visit: Payer: Self-pay

## 2019-02-02 ENCOUNTER — Ambulatory Visit (INDEPENDENT_AMBULATORY_CARE_PROVIDER_SITE_OTHER): Payer: Self-pay | Admitting: Internal Medicine

## 2019-02-02 VITALS — BP 159/86 | HR 67 | Temp 98.2°F | Wt 255.9 lb

## 2019-02-02 DIAGNOSIS — I129 Hypertensive chronic kidney disease with stage 1 through stage 4 chronic kidney disease, or unspecified chronic kidney disease: Secondary | ICD-10-CM

## 2019-02-02 DIAGNOSIS — Z79899 Other long term (current) drug therapy: Secondary | ICD-10-CM

## 2019-02-02 DIAGNOSIS — N183 Chronic kidney disease, stage 3 unspecified: Secondary | ICD-10-CM

## 2019-02-02 DIAGNOSIS — E119 Type 2 diabetes mellitus without complications: Secondary | ICD-10-CM

## 2019-02-02 DIAGNOSIS — I10 Essential (primary) hypertension: Secondary | ICD-10-CM

## 2019-02-02 DIAGNOSIS — E1122 Type 2 diabetes mellitus with diabetic chronic kidney disease: Secondary | ICD-10-CM

## 2019-02-02 LAB — POCT GLYCOSYLATED HEMOGLOBIN (HGB A1C): Hemoglobin A1C: 6.1 % — AB (ref 4.0–5.6)

## 2019-02-02 LAB — GLUCOSE, CAPILLARY: Glucose-Capillary: 100 mg/dL — ABNORMAL HIGH (ref 70–99)

## 2019-02-02 MED ORDER — SPIRONOLACTONE 50 MG PO TABS: 50.0000 mg | ORAL_TABLET | Freq: Every day | ORAL | 11 refills | Status: DC

## 2019-02-02 NOTE — Patient Instructions (Addendum)
Kendra Vega,  It was a pleasure seeing you, as always!   I will refill your blood pressure medications today. We will not make any adjustments at this time. I'd like you to call our clinic when your insurance cards come in so we can set up a kidney ultrasound and order some more lab tests.   For your Diabetes, we discussed taking tablet of metformin daily for a week. If you do not have significant side effects, you can increase to one tablet twice daily.   Take care! Dr. Chesley Mires

## 2019-02-02 NOTE — Progress Notes (Signed)
   CC: HTN, DM, CKD III  HPI:  Kendra Vega is a 48 y.o. female with PMHx below who presents for follow-up on HTN, DM, CKD III.   Past Medical History:  Diagnosis Date  . 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 Breast mass    rt breast ?abcess x's 1 week  . Hypertension   . Stroke (HCC)   . Type 2 diabetes mellitus without complication, without long-term current use of insulin (HCC) 10/21/2018   Review of Systems:  Review of Systems  Constitutional: Negative for chills, fever and weight loss.  Respiratory: Negative for cough and shortness of breath.   Cardiovascular: Negative for chest pain, palpitations and leg swelling.  Gastrointestinal: Negative for abdominal pain, nausea and vomiting.  Neurological: Negative for focal weakness and headaches.    Physical Exam:  Vitals:   02/02/19 1545  BP: (!) 159/86  Pulse: 67  Temp: 98.2 F (36.8 C)  TempSrc: Oral  SpO2: 100%  Weight: 255 lb 14.4 oz (116.1 kg)   Physical Exam Constitutional:      General: She is not in acute distress.    Appearance: Normal appearance.  HENT:     Mouth/Throat:     Mouth: Mucous membranes are moist.     Pharynx: Oropharynx is clear.  Neck:     Musculoskeletal: Neck supple.  Cardiovascular:     Rate and Rhythm: Normal rate and regular rhythm.  Pulmonary:     Effort: Pulmonary effort is normal.     Breath sounds: Normal breath sounds.  Abdominal:     General: There is no distension.     Palpations: Abdomen is soft.  Musculoskeletal:        General: No swelling.  Lymphadenopathy:     Cervical: No cervical adenopathy.  Neurological:     General: No focal deficit present.     Mental Status: She is alert and oriented to person, place, and time.  Psychiatric:        Mood and Affect: Mood normal.        Behavior: Behavior normal.      Assessment & Plan:   See Encounters Tab for problem based charting.  Patient discussed with Dr. Sandre Kitty

## 2019-02-03 LAB — BMP8+ANION GAP
Anion Gap: 17 mmol/L (ref 10.0–18.0)
BUN/Creatinine Ratio: 10 (ref 9–23)
BUN: 19 mg/dL (ref 6–24)
CHLORIDE: 99 mmol/L (ref 96–106)
CO2: 18 mmol/L — ABNORMAL LOW (ref 20–29)
Calcium: 9.6 mg/dL (ref 8.7–10.2)
Creatinine, Ser: 1.99 mg/dL — ABNORMAL HIGH (ref 0.57–1.00)
GFR calc Af Amer: 34 mL/min/{1.73_m2} — ABNORMAL LOW (ref >59)
GFR calc non Af Amer: 29 mL/min/{1.73_m2} — ABNORMAL LOW (ref >59)
GLUCOSE: 94 mg/dL (ref 65–99)
Potassium: 5.2 mmol/L (ref 3.5–5.2)
Sodium: 134 mmol/L (ref 134–144)

## 2019-02-04 ENCOUNTER — Encounter: Payer: Self-pay | Admitting: Internal Medicine

## 2019-02-04 MED ORDER — LISINOPRIL 10 MG PO TABS: 10.0000 mg | ORAL_TABLET | Freq: Every day | ORAL | 1 refills | Status: DC

## 2019-02-04 MED ORDER — LABETALOL HCL 100 MG PO TABS: 100.0000 mg | ORAL_TABLET | Freq: Two times a day (BID) | ORAL | 0 refills | Status: DC

## 2019-02-04 NOTE — Assessment & Plan Note (Signed)
A1C is 6.1. Given her fluctuating renal function, will discontinue Metformin and monitor off of therapy.

## 2019-02-04 NOTE — Assessment & Plan Note (Signed)
Creatinine has trended up to 1.99 from 1.59. Will discontinue Metformin and increase Lisinopril for blood pressure control and renal benefit.

## 2019-02-04 NOTE — Assessment & Plan Note (Addendum)
Patient's blood pressure remains elevated but continues to improve. Given her renal function, will increase Lisinopril to 10 mg. Will also change Metoprolol to Labetalol 100 mg BID.  We have been trying to work-up etiologies for possible secondary HTN, but have not been able to due to lack of insurance. Patient should have insurance card within the next few weeks. She was instructed to call the clinic to help her schedule the renal ultrasound. Will also work-up for hyperaldosteronism and OSA.  Return in 4 weeks for recheck and BMP.

## 2019-02-05 MED FILL — LABETALOL HCL 100 MG TABS: 100 | 30 days supply | Qty: 60 | Fill #0

## 2019-02-05 MED FILL — LISINOPRIL 10 MG TABS: 10 | 30 days supply | Qty: 30 | Fill #0

## 2019-02-05 NOTE — Progress Notes (Signed)
Internal Medicine Clinic Attending  Case discussed with Dr. Bloomfield at the time of the visit.  We reviewed the resident's history and exam and pertinent patient test results.  I agree with the assessment, diagnosis, and plan of care documented in the resident's note.  Alexander Raines, M.D., Ph.D.  

## 2019-02-16 ENCOUNTER — Other Ambulatory Visit: Payer: Self-pay | Admitting: Internal Medicine

## 2019-02-16 DIAGNOSIS — R252 Cramp and spasm: Secondary | ICD-10-CM

## 2019-02-16 MED FILL — SPIRONOLACTONE 50 MG TABLET: 50 | 30 days supply | Qty: 30 | Fill #4

## 2019-02-17 ENCOUNTER — Telehealth: Payer: Self-pay | Admitting: *Deleted

## 2019-02-17 MED FILL — METHOCARBAMOL 500 MG TABS: 500 | 5 days supply | Qty: 30 | Fill #0

## 2019-02-17 NOTE — Telephone Encounter (Signed)
Patient called in. States she picked up new med at pharmacy today and wanted to ensure it was correct. She spelled out labetalol as new med. Per 02/02/2019 OV note, PCP wanted patient to stop metoprolol and begin labetalol. There are no refills on med as PCP wants patient to return in 4 weeks for BP check and BMP. [facility] is not scheduling appts in April at this time 2/2 covid restrictions. Patient will call back on 03/10/2019 to schedule appt if possible. Kinnie Feil, RN, BSN

## 2019-02-17 NOTE — Telephone Encounter (Signed)
Thanks so much, Lauren! That is all correct. I'm happy to call patient if she calls back with any other questions.

## 2019-02-28 MED FILL — AMLODIPINE BESYLATE 10 MG T: 10 | 30 days supply | Qty: 30 | Fill #1

## 2019-03-05 MED FILL — ATORVASTATIN 20 MG TABLET: 20 | 30 days supply | Qty: 30 | Fill #3

## 2019-04-06 ENCOUNTER — Other Ambulatory Visit: Payer: Self-pay | Admitting: Internal Medicine

## 2019-04-06 DIAGNOSIS — I10 Essential (primary) hypertension: Secondary | ICD-10-CM

## 2019-04-06 MED FILL — SPIRONOLACTONE 50 MG TABLET: 50 | 30 days supply | Qty: 30 | Fill #5

## 2019-04-06 MED FILL — AMLODIPINE BESYLATE 10 MG T: 10 | 30 days supply | Qty: 30 | Fill #2

## 2019-04-06 MED FILL — LABETALOL HCL 100 MG TABS: 100 | 30 days supply | Qty: 60 | Fill #0

## 2019-04-06 MED FILL — LISINOPRIL 10 MG TABS: 10 | 30 days supply | Qty: 30 | Fill #1

## 2019-04-06 MED FILL — METHOCARBAMOL 500 MG TABS: 500 | 5 days supply | Qty: 30 | Fill #1

## 2019-04-28 MED FILL — ATORVASTATIN 20 MG TABLET: 20 | 30 days supply | Qty: 30 | Fill #4

## 2019-05-11 ENCOUNTER — Ambulatory Visit (INDEPENDENT_AMBULATORY_CARE_PROVIDER_SITE_OTHER): Payer: Self-pay | Admitting: Internal Medicine

## 2019-05-11 ENCOUNTER — Encounter: Payer: Self-pay | Admitting: Internal Medicine

## 2019-05-11 ENCOUNTER — Other Ambulatory Visit: Payer: Self-pay

## 2019-05-11 VITALS — BP 128/85 | HR 79 | Temp 98.2°F | Ht 65.0 in | Wt 250.1 lb

## 2019-05-11 DIAGNOSIS — K047 Periapical abscess without sinus: Secondary | ICD-10-CM

## 2019-05-11 DIAGNOSIS — Z79899 Other long term (current) drug therapy: Secondary | ICD-10-CM

## 2019-05-11 DIAGNOSIS — E119 Type 2 diabetes mellitus without complications: Secondary | ICD-10-CM

## 2019-05-11 DIAGNOSIS — Z7984 Long term (current) use of oral hypoglycemic drugs: Secondary | ICD-10-CM

## 2019-05-11 DIAGNOSIS — I129 Hypertensive chronic kidney disease with stage 1 through stage 4 chronic kidney disease, or unspecified chronic kidney disease: Secondary | ICD-10-CM

## 2019-05-11 DIAGNOSIS — N183 Chronic kidney disease, stage 3 unspecified: Secondary | ICD-10-CM

## 2019-05-11 DIAGNOSIS — I10 Essential (primary) hypertension: Secondary | ICD-10-CM

## 2019-05-11 DIAGNOSIS — E1122 Type 2 diabetes mellitus with diabetic chronic kidney disease: Secondary | ICD-10-CM

## 2019-05-11 DIAGNOSIS — I1A0 Resistant hypertension: Secondary | ICD-10-CM

## 2019-05-11 LAB — POCT GLYCOSYLATED HEMOGLOBIN (HGB A1C): Hemoglobin A1C: 6.2 % — AB (ref 4.0–5.6)

## 2019-05-11 LAB — GLUCOSE, CAPILLARY: Glucose-Capillary: 76 mg/dL (ref 70–99)

## 2019-05-11 MED ORDER — LISINOPRIL 10 MG PO TABS: 10.0000 mg | ORAL_TABLET | Freq: Every day | ORAL | 1 refills | Status: DC

## 2019-05-11 MED ORDER — FLUCONAZOLE 150 MG PO TABS: 150.0000 mg | ORAL_TABLET | Freq: Every day | ORAL | 0 refills | Status: DC

## 2019-05-11 MED ORDER — ACETAMINOPHEN-CODEINE #3 300-30 MG PO TABS: 1.0000 | ORAL_TABLET | ORAL | 0 refills | Status: DC | PRN

## 2019-05-11 MED ORDER — LABETALOL HCL 100 MG PO TABS: 100.0000 mg | ORAL_TABLET | Freq: Two times a day (BID) | ORAL | 0 refills | Status: DC

## 2019-05-11 MED ORDER — AMOXICILLIN-POT CLAVULANATE 875-125 MG PO TABS: 1.0000 | ORAL_TABLET | Freq: Two times a day (BID) | ORAL | 0 refills | Status: DC

## 2019-05-11 MED ORDER — SPIRONOLACTONE 50 MG PO TABS: 50.0000 mg | ORAL_TABLET | Freq: Every day | ORAL | 11 refills | Status: DC

## 2019-05-11 MED ORDER — AMLODIPINE BESYLATE 10 MG PO TABS: 10.0000 mg | ORAL_TABLET | Freq: Every day | ORAL | 0 refills | Status: DC

## 2019-05-11 MED FILL — ACETAMINOPHEN/COD #3 TABLET: 300-30 | 5 days supply | Qty: 30 | Fill #0

## 2019-05-11 MED FILL — FLUCONAZOLE 150 MG TABS: 150 | 1 days supply | Qty: 1 | Fill #0

## 2019-05-11 MED FILL — AMOX-CLAV 875-125 MG TABLET: 875-125 | 7 days supply | Qty: 14 | Fill #0

## 2019-05-11 MED FILL — LISINOPRIL 10 MG TABS: 10 | 30 days supply | Qty: 30 | Fill #0

## 2019-05-11 NOTE — Progress Notes (Signed)
   CC: acute dental pain; HTN, CKD III  HPI:  [name] is a 48 y.o. female who presents for follow-up on chronic HTN, CKD III and an acute complaint of dental pain.  Her pain began 4 days ago when she chipped her right upper molar biting down on some candy. Since then, she has been experiencing worsening pain with associated facial swelling. No fevers, chills, drainage from gums or bleeding.   She has been going well on current blood pressure medications. No symptoms of orthostatic hypotension. She denies any headaches, vision changes, chest pain, shortness of breath or swelling in her legs.   Past Medical History:  Diagnosis Date  . 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 Breast mass    rt breast ?abcess x's 1 week  . Hypertension   . Stroke ([place])   . Type 2 diabetes mellitus without complication, without long-term current use of insulin ([place]) 10/21/2018   Review of Systems:  Review of Systems  All other systems reviewed and are negative.   Physical Exam:  Vitals:   05/11/19 1334  BP: 128/85  Pulse: 79  Temp: 98.2 F (36.8 C)  TempSrc: Oral  SpO2: 100%  Weight: 250 lb 1.6 oz (113.4 kg)  Height: 5\' 5"  (1.651 m)   Physical Exam Constitutional:      General: She is not in acute distress.    Appearance: Normal appearance.  HENT:     Mouth/Throat:     Comments: Right upper molar appears chipped and black. Surrounding gingival tenderness and swelling.  Cardiovascular:     Rate and Rhythm: Normal rate and regular rhythm.  Pulmonary:     Effort: Pulmonary effort is normal.     Breath sounds: Normal breath sounds.  Abdominal:     General: There is no distension.     Palpations: Abdomen is soft.     Tenderness: There is no abdominal tenderness.  Musculoskeletal:        General: No swelling.  Lymphadenopathy:     Cervical: Cervical adenopathy present.  Skin:    General: Skin is warm and dry.  Neurological:     Mental Status: She is alert.  Psychiatric:         Mood and Affect: Mood normal.        Behavior: Behavior normal.      Assessment & Plan:   See Encounters Tab for problem based charting.  Patient discussed with Dr. Angelia Mould

## 2019-05-11 NOTE — Patient Instructions (Signed)
Kendra Vega, It was a pleasure seeing you!   Regarding your tooth pain, I will send in an antibiotic and some stronger pain medication. Please call around to get into see a dentist as quickly as possible. Many of them will take out of pocket payment.   I am sorry to hear about the death of your friends. I am glad you are getting into see your psychiatrist to determine if you need any medication adjustments.  Your blood pressure looks great today! I will refill your medications.   I'm checking some labs before you leave and will let you know if any changes need to be made. Otherwise, I'll see you in about 3 months! Please call before then with any questions or concerns.   Take care, Dr. Koleen Distance

## 2019-05-12 ENCOUNTER — Other Ambulatory Visit: Payer: Self-pay | Admitting: Internal Medicine

## 2019-05-12 DIAGNOSIS — K047 Periapical abscess without sinus: Secondary | ICD-10-CM | POA: Insufficient documentation

## 2019-05-12 DIAGNOSIS — I10 Essential (primary) hypertension: Secondary | ICD-10-CM

## 2019-05-12 LAB — BMP8+ANION GAP
Anion Gap: 13 mmol/L (ref 10.0–18.0)
BUN/Creatinine Ratio: 10 (ref 9–23)
BUN: 25 mg/dL — ABNORMAL HIGH (ref 6–24)
CO2: 17 mmol/L — ABNORMAL LOW (ref 20–29)
Calcium: 9.8 mg/dL (ref 8.7–10.2)
Chloride: 104 mmol/L (ref 96–106)
Creatinine, Ser: 2.57 mg/dL — ABNORMAL HIGH (ref 0.57–1.00)
GFR calc Af Amer: 25 mL/min/{1.73_m2} — ABNORMAL LOW (ref >59)
GFR calc non Af Amer: 21 mL/min/{1.73_m2} — ABNORMAL LOW (ref >59)
Glucose: 70 mg/dL (ref 65–99)
Potassium: 6.5 mmol/L — ABNORMAL HIGH (ref 3.5–5.2)
Sodium: 134 mmol/L (ref 134–144)

## 2019-05-12 MED ORDER — FLUCONAZOLE 150 MG PO TABS: 150.0000 mg | ORAL_TABLET | Freq: Every day | ORAL | 0 refills | Status: AC

## 2019-05-12 MED ORDER — ACETAMINOPHEN-CODEINE #3 300-30 MG PO TABS: 1.0000 | ORAL_TABLET | ORAL | 0 refills | Status: AC | PRN

## 2019-05-12 MED ORDER — AMLODIPINE BESYLATE 10 MG PO TABS: 10.0000 mg | ORAL_TABLET | Freq: Every day | ORAL | 0 refills | Status: DC

## 2019-05-12 MED ORDER — AMOXICILLIN-POT CLAVULANATE 875-125 MG PO TABS: 1.0000 | ORAL_TABLET | Freq: Two times a day (BID) | ORAL | 0 refills | Status: AC

## 2019-05-12 MED ORDER — SPIRONOLACTONE 50 MG PO TABS: 50.0000 mg | ORAL_TABLET | Freq: Every day | ORAL | 11 refills | Status: DC

## 2019-05-12 MED ORDER — LISINOPRIL 10 MG PO TABS: 10.0000 mg | ORAL_TABLET | Freq: Every day | ORAL | 1 refills | Status: DC

## 2019-05-12 MED ORDER — LABETALOL HCL 100 MG PO TABS: 100.0000 mg | ORAL_TABLET | Freq: Two times a day (BID) | ORAL | 0 refills | Status: DC

## 2019-05-12 NOTE — Assessment & Plan Note (Signed)
BMP today with potassium of 6.5, creatinine 2.5.   Holding Spironolactone until repeat BMP later this week.

## 2019-05-12 NOTE — Assessment & Plan Note (Signed)
Blood pressure is at goal. Currently on quadruple therapy with Amlodipine 10 mg, Spironolactone 50 mg, Lisinopril 10 mg, and Labetalol 100 mg BID. She denies any symptoms of orthostatic hypotension. No headaches, vision changes  Given her elevated potassium, will have patient hold Spironolactone until repeat BMP later this week.

## 2019-05-12 NOTE — Assessment & Plan Note (Signed)
A1C remains stable off of Metformin. Will continue to monitor.

## 2019-05-12 NOTE — Assessment & Plan Note (Signed)
Patient chipped her right upper molar 4 days ago biting down on candy. On exam, tooth is black with surrounding gingival swelling and tenderness that extends into her maxillary and auricular regions.  She is currently waiting on medicaid card to come in, but states she will be able to pay out of pocket for an urgent dentist visit.  Will treat with Tylenol 3 for pain and treat likely underlying infection with Augmentin until she is able to get tooth removed.

## 2019-05-18 ENCOUNTER — Other Ambulatory Visit: Payer: Self-pay

## 2019-05-18 ENCOUNTER — Other Ambulatory Visit (INDEPENDENT_AMBULATORY_CARE_PROVIDER_SITE_OTHER): Payer: Self-pay

## 2019-05-18 DIAGNOSIS — N183 Chronic kidney disease, stage 3 unspecified: Secondary | ICD-10-CM

## 2019-05-18 LAB — BASIC METABOLIC PANEL
Anion gap: 6 (ref 5–15)
BUN: 36 mg/dL — ABNORMAL HIGH (ref 6–20)
CO2: 19 mmol/L — ABNORMAL LOW (ref 22–32)
Calcium: 9.3 mg/dL (ref 8.9–10.3)
Chloride: 110 mmol/L (ref 98–111)
Creatinine, Ser: 2.72 mg/dL — ABNORMAL HIGH (ref 0.44–1.00)
GFR calc Af Amer: 23 mL/min — ABNORMAL LOW (ref >60)
GFR calc non Af Amer: 20 mL/min — ABNORMAL LOW (ref >60)
Glucose, Bld: 98 mg/dL (ref 70–99)
Potassium: 5.3 mmol/L — ABNORMAL HIGH (ref 3.5–5.1)
Sodium: 135 mmol/L (ref 135–145)

## 2019-05-18 NOTE — Progress Notes (Signed)
Internal Medicine Clinic Attending  Case discussed with Dr. Bloomfield at the time of the visit.  We reviewed the resident's history and exam and pertinent patient test results.  I agree with the assessment, diagnosis, and plan of care documented in the resident's note.  

## 2019-06-04 MED FILL — ATORVASTATIN 20 MG TABLET: 20 | 30 days supply | Qty: 30 | Fill #5

## 2019-06-04 MED FILL — LABETALOL HCL 100 MG TABS: 100 | 30 days supply | Qty: 60 | Fill #0

## 2019-06-04 MED FILL — METHOCARBAMOL 500 MG TABS: 500 | 5 days supply | Qty: 30 | Fill #2

## 2019-06-05 MED FILL — LISINOPRIL 10 MG TABS: 10 | 30 days supply | Qty: 30 | Fill #0

## 2019-06-09 ENCOUNTER — Encounter: Payer: Self-pay | Admitting: *Deleted

## 2019-06-19 MED FILL — AMLODIPINE BESYLATE 10 MG T: 10 | 30 days supply | Qty: 30 | Fill #0

## 2019-06-29 ENCOUNTER — Ambulatory Visit (INDEPENDENT_AMBULATORY_CARE_PROVIDER_SITE_OTHER): Payer: Self-pay | Admitting: Internal Medicine

## 2019-06-29 ENCOUNTER — Other Ambulatory Visit: Payer: Self-pay

## 2019-06-29 ENCOUNTER — Encounter: Payer: Self-pay | Admitting: Internal Medicine

## 2019-06-29 VITALS — BP 146/92 | HR 79 | Temp 98.6°F | Wt 254.6 lb

## 2019-06-29 DIAGNOSIS — N183 Chronic kidney disease, stage 3 unspecified: Secondary | ICD-10-CM

## 2019-06-29 DIAGNOSIS — I129 Hypertensive chronic kidney disease with stage 1 through stage 4 chronic kidney disease, or unspecified chronic kidney disease: Secondary | ICD-10-CM

## 2019-06-29 DIAGNOSIS — F4321 Adjustment disorder with depressed mood: Secondary | ICD-10-CM

## 2019-06-29 DIAGNOSIS — Z79899 Other long term (current) drug therapy: Secondary | ICD-10-CM

## 2019-06-29 DIAGNOSIS — I10 Essential (primary) hypertension: Secondary | ICD-10-CM

## 2019-06-29 DIAGNOSIS — Z634 Disappearance and death of family member: Secondary | ICD-10-CM

## 2019-06-29 MED ORDER — FUROSEMIDE 80 MG PO TABS: 80.0000 mg | ORAL_TABLET | Freq: Every day | ORAL | 1 refills | Status: DC

## 2019-06-29 NOTE — Patient Instructions (Signed)
Ms. Prell, I am so sorry to hear about your son. No one should ever have to feel the pain that you are feeling. I know it doesn't seem like it now, but you will make it through this. Continue to take it one day at a time. I am placing a referral to our in-house counselor for you to get some extra support through this. I'd like to see you back in about 2 months to see how the grieving process is going. Remember everyone is different and goes at their own pace.   For your blood pressure, I'd like you to try a new medication that should also help with the fluid in your legs. We'll have you come back in 1 week for a lab visit only.   Please call if you have any concerns or if there is anything I can do to help.   All my best, Dr. Koleen Distance

## 2019-06-30 ENCOUNTER — Encounter: Payer: Self-pay | Admitting: Internal Medicine

## 2019-06-30 DIAGNOSIS — F4321 Adjustment disorder with depressed mood: Secondary | ICD-10-CM | POA: Insufficient documentation

## 2019-06-30 DIAGNOSIS — Z634 Disappearance and death of family member: Secondary | ICD-10-CM | POA: Insufficient documentation

## 2019-06-30 MED FILL — FUROSEMIDE 80 MG TAB: 80 | 30 days supply | Qty: 30 | Fill #0

## 2019-06-30 NOTE — Progress Notes (Signed)
   CC: HTN, acute grief   HPI:  [name] is a 48 y.o. female with PMHx listed below who presents for follow-up on HTN and acute grief. Please see problem based charting for further details.   Past Medical History:  Diagnosis Date  . 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 Breast mass    rt breast ?abcess x's 1 week  . Hypertension   . Stroke ([place])   . Type 2 diabetes mellitus without complication, without long-term current use of insulin ([place]) 10/21/2018   Review of Systems:  Review of Systems  Constitutional: Negative for chills and fever.  Eyes: Negative for blurred vision and double vision.  Respiratory: Negative for cough and shortness of breath.   Cardiovascular: Positive for leg swelling. Negative for chest pain and palpitations.  Genitourinary: Negative for dysuria and hematuria.  Musculoskeletal: Negative for joint pain.  Neurological: Negative for dizziness and headaches.  Psychiatric/Behavioral: Positive for depression. Negative for suicidal ideas. The patient has insomnia.     Physical Exam:  Vitals:   06/29/19 1424 06/29/19 1513  BP: (!) 170/95 (!) 146/92  Pulse: 87 79  Temp: 98.6 F (37 C)   TempSrc: Oral   SpO2: 100%   Weight: 254 lb 9.6 oz (115.5 kg)    General: alert, cooperative female, appears stated age, NAD HEENT: conjunctiva normal  Neck: supple; no thyromegaly  CV: RRR; no m/r/g Pulm: Normal work of breathing; lungs CTAB Abd: Abdomen is soft, non-tender, non-distended  Ext: 1+ pitting edema BLE up to mid-shin Neuro: A&Ox; no focal deficits; normal gait Psych: depressed mood, tearful. Denies SI, HI    Assessment & Plan:   See Encounters Tab for problem based charting.  Patient discussed with Dr. Rebeca Alert

## 2019-06-30 NOTE — Assessment & Plan Note (Signed)
>>  ASSESSMENT AND PLAN FOR GRIEF AT LOSS OF CHILD WRITTEN ON 06/30/2019 10:50 PM BY BLOOMFIELD, CARLEY D, DO  Ms. Danielson suffered the loss of her only son 1 month ago and is still dealing with acute grief. She is followed by [name] who is managing with counseling and Klonopin to help with sleep. She requests additional counseling through our in-house behavioral counselor. Referral placed to [facility].

## 2019-06-30 NOTE — Assessment & Plan Note (Addendum)
Patient's renal function has continued to slowly decline. She is currently in the process of getting insurance at which time we will plan to make nephrology referral.

## 2019-06-30 NOTE — Assessment & Plan Note (Signed)
Kendra Vega suffered the loss of her only son 1 month ago and is still dealing with acute grief. She is followed by [name] who is managing with counseling and Klonopin to help with sleep. She requests additional counseling through our in-house behavioral counselor. Referral placed to [facility].

## 2019-06-30 NOTE — Assessment & Plan Note (Signed)
Blood pressure today 170/95, 146/92 on repeat.  Spironolactone was discontinued at last visit due to hyperkalemia. She has some BLE edema on exam but is otherwise asymptomatic. Will initiate Lasix 80 mg and have her return for blood pressure re-check and BMP in 1 week.

## 2019-07-01 ENCOUNTER — Other Ambulatory Visit: Payer: Self-pay | Admitting: Internal Medicine

## 2019-07-01 DIAGNOSIS — R252 Cramp and spasm: Secondary | ICD-10-CM

## 2019-07-01 MED FILL — METHOCARBAMOL 500 MG TABS: 500 | 5 days supply | Qty: 30 | Fill #0

## 2019-07-02 NOTE — Progress Notes (Signed)
Internal Medicine Clinic Attending  Case discussed with Dr. Bloomfield at the time of the visit.  We reviewed the resident's history and exam and pertinent patient test results.  I agree with the assessment, diagnosis, and plan of care documented in the resident's note.  Alexander Raines, M.D., Ph.D.  

## 2019-07-09 ENCOUNTER — Other Ambulatory Visit: Payer: Self-pay

## 2019-07-09 ENCOUNTER — Other Ambulatory Visit (INDEPENDENT_AMBULATORY_CARE_PROVIDER_SITE_OTHER): Payer: Self-pay

## 2019-07-09 DIAGNOSIS — I10 Essential (primary) hypertension: Secondary | ICD-10-CM

## 2019-07-10 LAB — BMP8+ANION GAP
Anion Gap: 18 mmol/L (ref 10.0–18.0)
BUN/Creatinine Ratio: 10 (ref 9–23)
BUN: 20 mg/dL (ref 6–24)
CO2: 22 mmol/L (ref 20–29)
Calcium: 9.7 mg/dL (ref 8.7–10.2)
Chloride: 99 mmol/L (ref 96–106)
Creatinine, Ser: 2.06 mg/dL — ABNORMAL HIGH (ref 0.57–1.00)
GFR calc Af Amer: 32 mL/min/{1.73_m2} — ABNORMAL LOW (ref >59)
GFR calc non Af Amer: 28 mL/min/{1.73_m2} — ABNORMAL LOW (ref >59)
Glucose: 95 mg/dL (ref 65–99)
Potassium: 4.4 mmol/L (ref 3.5–5.2)
Sodium: 139 mmol/L (ref 134–144)

## 2019-07-10 MED FILL — ATORVASTATIN 20 MG TABLET: 20 | 30 days supply | Qty: 30 | Fill #6

## 2019-07-10 MED FILL — LISINOPRIL 10 MG TABS: 10 | 30 days supply | Qty: 30 | Fill #0

## 2019-07-14 ENCOUNTER — Telehealth: Payer: Self-pay | Admitting: Internal Medicine

## 2019-07-14 ENCOUNTER — Other Ambulatory Visit: Payer: Self-pay | Admitting: *Deleted

## 2019-07-14 DIAGNOSIS — I10 Essential (primary) hypertension: Secondary | ICD-10-CM

## 2019-07-14 MED ORDER — LABETALOL HCL 100 MG PO TABS: 100.0000 mg | ORAL_TABLET | Freq: Two times a day (BID) | ORAL | 5 refills | Status: DC

## 2019-07-14 MED FILL — LABETALOL HCL 100 MG TABS: 100 | 30 days supply | Qty: 60 | Fill #0

## 2019-07-14 NOTE — Telephone Encounter (Signed)
Pt is requesting a callback from the nurse regarding medicine [phone]

## 2019-07-14 NOTE — Telephone Encounter (Signed)
Called pt, opened new encounter

## 2019-07-17 ENCOUNTER — Telehealth: Payer: Self-pay | Admitting: Licensed Clinical Social Worker

## 2019-07-17 NOTE — Telephone Encounter (Signed)
Patient was contacted due to a referral from her doctor. Patient agreed to services, and will be added to my schedule for 9/1 @ 1:00.

## 2019-07-20 MED FILL — FUROSEMIDE 80 MG TAB: 80 | 30 days supply | Qty: 30 | Fill #1

## 2019-07-20 MED FILL — METHOCARBAMOL 500 MG TABS: 500 | 5 days supply | Qty: 30 | Fill #1

## 2019-07-20 MED FILL — AMLODIPINE BESYLATE 10 MG T: 10 | 30 days supply | Qty: 30 | Fill #1

## 2019-07-22 ENCOUNTER — Other Ambulatory Visit: Payer: Self-pay

## 2019-07-22 ENCOUNTER — Ambulatory Visit: Payer: Self-pay

## 2019-07-28 ENCOUNTER — Ambulatory Visit: Payer: Self-pay | Admitting: Licensed Clinical Social Worker

## 2019-08-06 ENCOUNTER — Other Ambulatory Visit: Payer: Self-pay

## 2019-08-06 ENCOUNTER — Encounter: Payer: Self-pay | Admitting: Licensed Clinical Social Worker

## 2019-08-06 ENCOUNTER — Ambulatory Visit (INDEPENDENT_AMBULATORY_CARE_PROVIDER_SITE_OTHER): Payer: Medicaid Other | Admitting: Licensed Clinical Social Worker

## 2019-08-06 DIAGNOSIS — F4321 Adjustment disorder with depressed mood: Secondary | ICD-10-CM

## 2019-08-06 DIAGNOSIS — Z634 Disappearance and death of family member: Secondary | ICD-10-CM

## 2019-08-06 NOTE — BH Specialist Note (Signed)
Integrated Behavioral Health Visit via Telemedicine (Telephone)  08/06/2019 Kendra Vega [id number]   Session Start time: 9:35  Session End time: 9:55 Total time: 20 minutes  Referring Provider: Dr. Koleen Distance Type of Visit: Telephonic Patient location: Mothers' home [facility] Provider location: Office All persons participating in visit: [facility] and patient  Confirmed patient's address: Yes  Confirmed patient's phone number: Yes  Any changes to demographics: Yes  [place] [id number]  Discussed confidentiality: Yes    The following statements were read to the patient and/or legal guardian that are established with the [facility] Provider.  "The purpose of this phone visit is to provide behavioral health care while limiting exposure to the coronavirus (COVID19).  There is a possibility of technology failure and discussed alternative modes of communication if that failure occurs."  "By engaging in this telephone visit, you consent to the provision of healthcare.  Additionally, you authorize for your insurance to be billed for the services provided during this telephone visit."   Patient and/or legal guardian consented to telephone visit: Yes   PRESENTING CONCERNS: Patient and/or family reports the following symptoms/concerns: grief, anxiety, flashbacks of son passing, and panic symptoms.  Duration of problem: increased since July; Severity of problem: moderate  GOALS ADDRESSED: Patient will: 1.  Reduce symptoms of: anxiety and grief.  2.  Increase knowledge and/or ability of: coping skills and healthy habits  3.  Demonstrate ability to: Increase healthy adjustment to current life circumstances, Increase motivation to adhere to plan of care, Decrease self-medicating behaviors and Begin healthy grieving over loss  INTERVENTIONS: Interventions utilized:  Brief CBT, Supportive Counseling and Link to [employer] Standardized Assessments completed: assessed for  SI, HI, and self-harm.  ASSESSMENT: Patient currently experiencing grief. Patient lost her son in July due to him passing from Congestive Heart Failure. Patient lost her home due to her son passing, and had to move abruptly because of complete loss of income. Patient was just approved for disability, and now has enough income to get her own place. Patient had a history of anxiety before her son passed, and now her anxiety levels have increase due to grief as well. Patient reported having panic attacks almost daily since losing her son. Patient reported [facility] gave her Buspar, Seroquel, and Prozac. Patient is not taking this medication, and reported this is not helpful. Patient recently took a Xanax, and feels this is the only medication that is helpful. Patient was encouraged not to take medication that she is not prescribed. Patient was provided with the option to go to a different psychiatrist due to being unhappy with [facility].   Patient denies any thoughts of hurting herself or others. Patient does have fears of dying, and has never lived alone. Patient is planning to live alone as soon as she can find a place to live.   Patient may benefit from counseling and medication management services.  PLAN: 1. Follow up with behavioral health clinician on : three weeks.  Dessie Coma, [facility], [place]

## 2019-08-12 ENCOUNTER — Other Ambulatory Visit: Payer: Self-pay | Admitting: Internal Medicine

## 2019-08-12 DIAGNOSIS — Z8673 Personal history of transient ischemic attack (TIA), and cerebral infarction without residual deficits: Secondary | ICD-10-CM

## 2019-08-12 MED FILL — METHOCARBAMOL 500 MG TABS: 500 | 5 days supply | Qty: 30 | Fill #2

## 2019-08-12 MED FILL — LISINOPRIL 10 MG TABS: 10 | 30 days supply | Qty: 30 | Fill #1

## 2019-08-12 MED FILL — LABETALOL HCL 100 MG TABS: 100 | 30 days supply | Qty: 60 | Fill #1

## 2019-08-13 MED FILL — ATORVASTATIN 20 MG TABLET: 20 | 30 days supply | Qty: 30 | Fill #0

## 2019-08-17 ENCOUNTER — Encounter: Payer: Self-pay | Admitting: Internal Medicine

## 2019-08-24 ENCOUNTER — Other Ambulatory Visit: Payer: Self-pay | Admitting: Internal Medicine

## 2019-08-24 MED FILL — AMLODIPINE BESYLATE 10 MG T: 10 | 30 days supply | Qty: 30 | Fill #2

## 2019-08-25 MED FILL — FUROSEMIDE 80 MG TAB: 80 | 90 days supply | Qty: 90 | Fill #0

## 2019-08-26 ENCOUNTER — Ambulatory Visit: Payer: Self-pay

## 2019-08-27 ENCOUNTER — Ambulatory Visit: Payer: Medicaid Other | Admitting: Licensed Clinical Social Worker

## 2019-08-27 ENCOUNTER — Telehealth: Payer: Self-pay | Admitting: Licensed Clinical Social Worker

## 2019-08-27 NOTE — Telephone Encounter (Signed)
Patient was called due to her scheduled appointment. Patient did not answer, and a vm was left for the patient.

## 2019-08-31 ENCOUNTER — Encounter: Payer: Self-pay | Admitting: Internal Medicine

## 2019-08-31 ENCOUNTER — Other Ambulatory Visit: Payer: Self-pay

## 2019-08-31 ENCOUNTER — Ambulatory Visit: Payer: Medicaid Other | Admitting: Internal Medicine

## 2019-08-31 VITALS — BP 144/81 | HR 84 | Temp 98.4°F | Wt 259.7 lb

## 2019-08-31 DIAGNOSIS — I10 Essential (primary) hypertension: Secondary | ICD-10-CM

## 2019-08-31 DIAGNOSIS — L84 Corns and callosities: Secondary | ICD-10-CM

## 2019-08-31 DIAGNOSIS — E119 Type 2 diabetes mellitus without complications: Secondary | ICD-10-CM | POA: Diagnosis not present

## 2019-08-31 DIAGNOSIS — Z79899 Other long term (current) drug therapy: Secondary | ICD-10-CM

## 2019-08-31 DIAGNOSIS — E1122 Type 2 diabetes mellitus with diabetic chronic kidney disease: Secondary | ICD-10-CM | POA: Diagnosis not present

## 2019-08-31 DIAGNOSIS — Z634 Disappearance and death of family member: Secondary | ICD-10-CM

## 2019-08-31 DIAGNOSIS — F419 Anxiety disorder, unspecified: Secondary | ICD-10-CM | POA: Diagnosis not present

## 2019-08-31 DIAGNOSIS — I129 Hypertensive chronic kidney disease with stage 1 through stage 4 chronic kidney disease, or unspecified chronic kidney disease: Secondary | ICD-10-CM | POA: Diagnosis not present

## 2019-08-31 DIAGNOSIS — Z1231 Encounter for screening mammogram for malignant neoplasm of breast: Secondary | ICD-10-CM

## 2019-08-31 DIAGNOSIS — F4321 Adjustment disorder with depressed mood: Secondary | ICD-10-CM

## 2019-08-31 DIAGNOSIS — F329 Major depressive disorder, single episode, unspecified: Secondary | ICD-10-CM

## 2019-08-31 DIAGNOSIS — N1832 Chronic kidney disease, stage 3b: Secondary | ICD-10-CM

## 2019-08-31 LAB — POCT GLYCOSYLATED HEMOGLOBIN (HGB A1C): Hemoglobin A1C: 5.7 % — AB (ref 4.0–5.6)

## 2019-08-31 LAB — GLUCOSE, CAPILLARY: Glucose-Capillary: 89 mg/dL (ref 70–99)

## 2019-08-31 NOTE — Patient Instructions (Signed)
Kendra Vega, It was great seeing you as always!  Today we discussed your blood pressure, which is looking good. We will check some lab work today, and I will let you know if we need to make any changes.  I have placed a referral for you to be seen by a kidney specialist given your decreased kidney function.   I have also placed referrals for mammogram and foot doctor.   We'll see you back after the first of the year!  Take care, Dr. Koleen Distance

## 2019-09-01 ENCOUNTER — Encounter: Payer: Self-pay | Admitting: Internal Medicine

## 2019-09-01 DIAGNOSIS — Z1231 Encounter for screening mammogram for malignant neoplasm of breast: Secondary | ICD-10-CM | POA: Insufficient documentation

## 2019-09-01 LAB — BMP8+ANION GAP
Anion Gap: 20 mmol/L — ABNORMAL HIGH (ref 10.0–18.0)
BUN/Creatinine Ratio: 9 (ref 9–23)
BUN: 16 mg/dL (ref 6–24)
CO2: 19 mmol/L — ABNORMAL LOW (ref 20–29)
Calcium: 9.5 mg/dL (ref 8.7–10.2)
Chloride: 99 mmol/L (ref 96–106)
Creatinine, Ser: 1.79 mg/dL — ABNORMAL HIGH (ref 0.57–1.00)
GFR calc Af Amer: 38 mL/min/{1.73_m2} — ABNORMAL LOW (ref >59)
GFR calc non Af Amer: 33 mL/min/{1.73_m2} — ABNORMAL LOW (ref >59)
Glucose: 83 mg/dL (ref 65–99)
Potassium: 4.1 mmol/L (ref 3.5–5.2)
Sodium: 138 mmol/L (ref 134–144)

## 2019-09-01 NOTE — Assessment & Plan Note (Signed)
>>  ASSESSMENT AND PLAN FOR GRIEF AT LOSS OF CHILD WRITTEN ON 09/01/2019  2:46 PM BY BLOOMFIELD, CARLEY D, DO  Still understandably grieving, but states she is doing better taking it day-by-day. She has found CBT with Miquel Dunn to be helpful and looks forward to transitioning psychiatric care from [facility] to a different practice.

## 2019-09-01 NOTE — Assessment & Plan Note (Signed)
Blood pressure initially elevated but improved to 144/80 on repeat. Initiation of  Lasix last visit greatly improved lower extremity edema. BMP shows improved creatinine and stable electrolytes. Will continue current regimen.

## 2019-09-01 NOTE — Assessment & Plan Note (Signed)
Still understandably grieving, but states she is doing better taking it day-by-day. She has found CBT with Miquel Dunn to be helpful and looks forward to transitioning psychiatric care from [facility] to a different practice.

## 2019-09-01 NOTE — Assessment & Plan Note (Signed)
A1C is 5.7 today off of medications. Will continue to monitor every 6 months.

## 2019-09-01 NOTE — Assessment & Plan Note (Signed)
Patient now has medicaid so will place referral to nephrologist given her stage 3b kidney disease. Creatinine improved on Lasix 80 mg daily. Can continue ACE inhibitor for now as well.

## 2019-09-01 NOTE — Progress Notes (Signed)
   CC: grief, HTN, stage 3b CKD   HPI:  Kendra Vega is a 48 y.o. female with PMHx listed below who presents for follow-up on acute grief with recent loss of her son, HTN, stage 3b CKD. Please see problem based charting for further details.   Past Medical History:  Diagnosis Date  . 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 Breast mass    rt breast ?abcess x's 1 week  . Hypertension   . Stroke ([place])   . Type 2 diabetes mellitus without complication, without long-term current use of insulin ([place]) 10/21/2018   Review of Systems:  Review of Systems  Constitutional: Negative for chills, fever and weight loss.  Eyes: Negative for blurred vision.  Respiratory: Negative for shortness of breath.   Cardiovascular: Negative for chest pain, palpitations and leg swelling.  Gastrointestinal: Negative for abdominal pain, constipation and nausea.  Genitourinary: Negative for hematuria.  Musculoskeletal: Negative for joint pain and myalgias.  Neurological: Negative for dizziness, sensory change, focal weakness and headaches.  Psychiatric/Behavioral: Positive for depression. Negative for suicidal ideas. The patient is nervous/anxious.      Physical Exam:  Vitals:   08/31/19 1455 08/31/19 1517  BP: (!) 176/82 (!) 144/81  Pulse: 100 84  Temp: 98.4 F (36.9 C)   TempSrc: Oral   SpO2: 100%   Weight: 259 lb 11.2 oz (117.8 kg)    General: pleasant, well-appearing female, appears stated age in NAD HEENT: conjunctiva normal; TMs normal CV: RRR; no m/r/g Pulm: normal work of breathing; lungs CTAB Abd: BS+; abdomen soft, non-tender, non-distended Neuro: A&Ox3; no focal deficits Ext: trace edema BLE Psych: intermittently tearful when talking about her son   Assessment & Plan:   See Encounters Tab for problem based charting.  Patient discussed with Dr. Dareen Piano

## 2019-09-01 NOTE — Progress Notes (Signed)
Internal Medicine Clinic Attending  Case discussed with Dr. Bloomfield at the time of the visit.  We reviewed the resident's history and exam and pertinent patient test results.  I agree with the assessment, diagnosis, and plan of care documented in the resident's note.  

## 2019-09-01 NOTE — Assessment & Plan Note (Signed)
Referral for mammogram screening placed today.

## 2019-09-02 ENCOUNTER — Other Ambulatory Visit: Payer: Self-pay | Admitting: Internal Medicine

## 2019-09-02 MED ORDER — ESOMEPRAZOLE MAGNESIUM 20 MG PO CPDR: 20.0000 mg | DELAYED_RELEASE_CAPSULE | Freq: Every day | ORAL | 1 refills | Status: DC

## 2019-09-02 MED FILL — ESOMEPRAZOLE MAG DR 20 MG C: 20 | 30 days supply | Qty: 30 | Fill #0

## 2019-09-02 NOTE — Telephone Encounter (Signed)
RX sent. Thanks

## 2019-09-02 NOTE — Telephone Encounter (Signed)
Pt was here on Monday, medicine was suppose to call in but she check with pharmacy and medicine wasn't there.  It was for acid reflux medicine [phone]

## 2019-09-02 NOTE — Telephone Encounter (Signed)
Pt called / informed of new rx for Esomeprazole.

## 2019-09-07 ENCOUNTER — Other Ambulatory Visit: Payer: Self-pay | Admitting: Internal Medicine

## 2019-09-07 DIAGNOSIS — R252 Cramp and spasm: Secondary | ICD-10-CM

## 2019-09-07 DIAGNOSIS — I10 Essential (primary) hypertension: Secondary | ICD-10-CM

## 2019-09-07 MED FILL — LISINOPRIL 10 MG TABS: 10 | 90 days supply | Qty: 90 | Fill #0

## 2019-09-07 NOTE — Telephone Encounter (Signed)
Unclear why she is still on methocarbamol, will not renew at this time, but refilled lisinopril

## 2019-09-08 MED FILL — ATORVASTATIN CALCIUM 20 MG: 20 | 30 days supply | Qty: 30 | Fill #1

## 2019-09-10 ENCOUNTER — Other Ambulatory Visit: Payer: Self-pay | Admitting: Internal Medicine

## 2019-09-10 DIAGNOSIS — R252 Cramp and spasm: Secondary | ICD-10-CM

## 2019-09-10 MED FILL — METHOCARBAMOL 500 MG TABS: 500 | 5 days supply | Qty: 30 | Fill #0

## 2019-09-10 NOTE — Telephone Encounter (Signed)
Ok, thank you for clarifying. I will refill for now, should follow up with Dr. Koleen Distance to evaluate why she is still having muscle spasms.

## 2019-09-10 NOTE — Telephone Encounter (Signed)
Refill refused on 10/12. Called pt - stated she continues having muscle spasma; she only have 2 pills left.

## 2019-09-10 NOTE — Telephone Encounter (Signed)
I believe I refilled this earlier this week.

## 2019-09-10 NOTE — Telephone Encounter (Signed)
methocarbamol (ROBAXIN) 500 MG tablet, REFILL REQUEST @  [place], [place] [employer] [phone] (Phone) [phone] (Fax)

## 2019-09-15 ENCOUNTER — Encounter: Payer: Self-pay | Admitting: Podiatry

## 2019-09-15 ENCOUNTER — Other Ambulatory Visit: Payer: Self-pay

## 2019-09-15 ENCOUNTER — Ambulatory Visit: Payer: Medicaid Other | Admitting: Podiatry

## 2019-09-15 DIAGNOSIS — M79672 Pain in left foot: Secondary | ICD-10-CM | POA: Diagnosis not present

## 2019-09-15 DIAGNOSIS — Q828 Other specified congenital malformations of skin: Secondary | ICD-10-CM

## 2019-09-15 DIAGNOSIS — M79671 Pain in right foot: Secondary | ICD-10-CM | POA: Diagnosis not present

## 2019-09-15 NOTE — Progress Notes (Signed)
Subjective:  Patient ID: Kendra Vega, female    DOB: [birth year],  MRN: [id number]  Chief Complaint  Patient presents with  . Callouses    Bilateral plantar callous trim. Pt states have had callouses for 6 years and they are painful to walk on.    48 y.o. female presents with the above complaint.  States that these callus has been painful in nature.  She has done soaks which has not helped.  Tried all conservative therapy which none of them have helped.  He states that she does not know what else to do.  And they have been painful while ambulating.  She denies any nausea fever chills vomiting.   Review of Systems: Negative except as noted in the HPI. Denies N/V/F/Ch.  Past Medical History:  Diagnosis Date  . 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 Breast mass    rt breast ?abcess x's 1 week  . Hypertension   . Stroke ([employer])   . Type 2 diabetes mellitus without complication, without long-term current use of insulin ([place]) 10/21/2018    Current Outpatient Medications:  .  acetaminophen (TYLENOL) 500 MG tablet, Take 1,000 mg by mouth every 6 (six) hours as needed., Disp: , Rfl:  .  amLODipine (NORVASC) 10 MG tablet, Take 1 tablet (10 mg total) by mouth daily., Disp: 90 tablet, Rfl: 0 .  aspirin EC 325 MG EC tablet, Take 1 tablet (325 mg total) by mouth daily., Disp: 30 tablet, Rfl: 0 .  atorvastatin (LIPITOR) 20 MG tablet, TAKE 1 TABLET (20 MG TOTAL) BY MOUTH DAILY AT 6 PM., Disp: 30 tablet, Rfl: 6 .  Blood Glucose Monitoring Suppl (TRUE METRIX METER) DEVI, 1 each by Does not apply route daily., Disp: 1 Device, Rfl: 0 .  clopidogrel (PLAVIX) 75 MG tablet, Take 1 tablet (75 mg total) by mouth daily., Disp: 30 tablet, Rfl: 6 .  cyclobenzaprine (FLEXERIL) 10 MG tablet, TAKE 1 TABLET BY MOUTH 2 TIMES DAILY AS NEEDED FOR MUSCLE SPASMS., Disp: 30 tablet, Rfl: 2 .  esomeprazole (NEXIUM) 20 MG capsule, Take 1 capsule (20 mg total) by mouth daily., Disp: 30 capsule, Rfl: 1 .  furosemide  (LASIX) 80 MG tablet, TAKE 1 TABLET (80 MG TOTAL) BY MOUTH DAILY., Disp: 90 tablet, Rfl: 1 .  glucose blood (TRUE METRIX BLOOD GLUCOSE TEST) test strip, Use once daily, Disp: 100 each, Rfl: 12 .  hydrOXYzine (ATARAX/VISTARIL) 25 MG tablet, Take 1 tablet (25 mg total) by mouth 3 (three) times daily., Disp: 90 tablet, Rfl: 6 .  lisinopril (ZESTRIL) 10 MG tablet, TAKE 1 TABLET (10 MG TOTAL) BY MOUTH DAILY., Disp: 90 tablet, Rfl: 1 .  lurasidone (LATUDA) 20 MG TABS tablet, Take 1 tablet (20 mg total) by mouth at bedtime., Disp: 60 tablet, Rfl: 6 .  methocarbamol (ROBAXIN) 500 MG tablet, TAKE 2 TABLETS (1,000 MG TOTAL) BY MOUTH EVERY 8 (EIGHT) HOURS AS NEEDED FOR MUSCLE SPASMS., Disp: 30 tablet, Rfl: 2 .  nicotine (NICODERM CQ - DOSED IN MG/24 HOURS) 21 mg/24hr patch, Place 1 patch (21 mg total) onto the skin daily., Disp: 28 patch, Rfl: 0 .  polyethylene glycol powder (GLYCOLAX/MIRALAX) powder, Take 17 g by mouth daily., Disp: 3350 g, Rfl: 1 .  TRUEPLUS LANCETS 28G MISC, 1 each by Does not apply route daily., Disp: 100 each, Rfl: 12 .  labetalol (NORMODYNE) 100 MG tablet, Take 1 tablet (100 mg total) by mouth 2 (two) times daily., Disp: 60 tablet, Rfl:  5  Social History   Tobacco Use  Smoking Status Current Some Day Smoker  . Packs/day: 0.20  . Years: 7.00  . Pack years: 1.40  . Types: Cigarettes  . Last attempt to quit: 07/29/2017  . Years since quitting: 2.1  Smokeless Tobacco Never Used    No Known Allergies Objective:  There were no vitals filed for this visit. There is no height or weight on file to calculate BMI. Constitutional Well developed. Well nourished.  Vascular Dorsalis pedis pulses palpable bilaterally. Posterior tibial pulses palpable bilaterally. Capillary refill normal to all digits.  No cyanosis or clubbing noted. Pedal hair growth normal.  Neurologic Normal speech. Oriented to person, place, and time. Epicritic sensation to light touch grossly present bilaterally.   Dermatologic  keratotic lesion noted at the plantar hindfoot of the left foot and right hallux IPJ.  Pain on palpation to the lesions.  Orthopedic: Normal joint ROM without pain or crepitus bilaterally. No visible deformities. No bony tenderness.   Radiographs: None Assessment:   1. Porokeratosis   2. Bilateral foot pain    Plan:  Patient was evaluated and treated and all questions answered.  Porokeratosis bilaterally left plantar midfoot and right hallux IPJ -Using a chisel blade, the hyperkeratotic lesions were debrided down to granular striated tissue.  No complications noted.  No pinpoint bleeding noted. -Upon adequate debridement patient felt immediate relief. I explained to the patient the etiology and all the treatment options available for treating porokeratosis.  Return in about 3 months (around 12/16/2019).

## 2019-09-16 MED FILL — LABETALOL HCL 100 MG TABS: 100 | 30 days supply | Qty: 60 | Fill #2

## 2019-09-21 ENCOUNTER — Other Ambulatory Visit: Payer: Self-pay | Admitting: Internal Medicine

## 2019-09-21 DIAGNOSIS — I10 Essential (primary) hypertension: Secondary | ICD-10-CM

## 2019-09-21 MED FILL — AMLODIPINE BESYLATE 10 MG T: 10 | 90 days supply | Qty: 90 | Fill #0

## 2019-09-28 MED FILL — ESOMEPRAZOLE MAG DR 20 MG C: 20 | 30 days supply | Qty: 30 | Fill #1

## 2019-09-28 MED FILL — METHOCARBAMOL 500 MG TABS: 500 | 5 days supply | Qty: 30 | Fill #1

## 2019-10-07 ENCOUNTER — Ambulatory Visit
Admission: RE | Admit: 2019-10-07 | Discharge: 2019-10-07 | Disposition: A | Discharge status: Home / Self Care | Payer: Medicaid Other | Source: Ambulatory Visit | Attending: Internal Medicine | Admitting: Internal Medicine

## 2019-10-07 ENCOUNTER — Other Ambulatory Visit: Payer: Self-pay

## 2019-10-07 DIAGNOSIS — Z1231 Encounter for screening mammogram for malignant neoplasm of breast: Secondary | ICD-10-CM

## 2019-10-15 MED FILL — METHOCARBAMOL 500 MG TABS: 500 | 5 days supply | Qty: 30 | Fill #2

## 2019-10-19 MED FILL — ATORVASTATIN CALCIUM 20 MG: 20 | 30 days supply | Qty: 30 | Fill #2

## 2019-10-26 ENCOUNTER — Other Ambulatory Visit: Payer: Self-pay | Admitting: Internal Medicine

## 2019-10-26 MED FILL — LABETALOL HCL 100 MG TABS: 100 | 30 days supply | Qty: 60 | Fill #3

## 2019-10-27 ENCOUNTER — Other Ambulatory Visit: Payer: Self-pay | Admitting: Nephrology

## 2019-10-27 DIAGNOSIS — N1832 Chronic kidney disease, stage 3b: Secondary | ICD-10-CM

## 2019-10-28 MED FILL — ESOMEPRAZOLE MAG DR 20 MG C: 20 | 90 days supply | Qty: 90 | Fill #0

## 2019-10-30 ENCOUNTER — Other Ambulatory Visit: Payer: Self-pay | Admitting: Internal Medicine

## 2019-10-30 DIAGNOSIS — R252 Cramp and spasm: Secondary | ICD-10-CM

## 2019-11-03 ENCOUNTER — Ambulatory Visit
Admission: RE | Admit: 2019-11-03 | Discharge: 2019-11-03 | Disposition: A | Discharge status: Home / Self Care | Payer: Medicaid Other | Source: Ambulatory Visit | Attending: Nephrology | Admitting: Nephrology

## 2019-11-03 DIAGNOSIS — N1832 Chronic kidney disease, stage 3b: Secondary | ICD-10-CM

## 2019-11-03 MED FILL — METHOCARBAMOL 500 MG TABS: 500 | 5 days supply | Qty: 30 | Fill #0

## 2019-11-16 MED FILL — METHOCARBAMOL 500 MG TABS: 500 | 5 days supply | Qty: 30 | Fill #1

## 2019-11-16 MED FILL — ATORVASTATIN CALCIUM 20 MG: 20 | 30 days supply | Qty: 30 | Fill #3

## 2019-11-30 MED FILL — FUROSEMIDE 80 MG TAB: 80 | 90 days supply | Qty: 90 | Fill #1

## 2019-11-30 MED FILL — LABETALOL HCL 100 MG TABS: 100 | 30 days supply | Qty: 60 | Fill #4

## 2019-11-30 MED FILL — METHOCARBAMOL 500 MG TABS: 500 | 5 days supply | Qty: 30 | Fill #2

## 2019-11-30 MED FILL — LISINOPRIL 10 MG TABS: 10 | 90 days supply | Qty: 90 | Fill #1

## 2019-12-08 ENCOUNTER — Inpatient Hospital Stay (HOSPITAL_COMMUNITY): Admission: RE | Admit: 2019-12-08 | Payer: Medicare Other | Source: Ambulatory Visit

## 2019-12-09 ENCOUNTER — Encounter (HOSPITAL_COMMUNITY): Payer: Self-pay | Admitting: Oral Surgery

## 2019-12-09 ENCOUNTER — Other Ambulatory Visit: Payer: Self-pay

## 2019-12-09 ENCOUNTER — Other Ambulatory Visit (HOSPITAL_COMMUNITY)
Admission: RE | Admit: 2019-12-09 | Discharge: 2019-12-09 | Disposition: A | Discharge status: Home / Self Care | Payer: Medicare Other | Source: Ambulatory Visit | Attending: Oral Surgery | Admitting: Oral Surgery

## 2019-12-09 DIAGNOSIS — Z01812 Encounter for preprocedural laboratory examination: Secondary | ICD-10-CM | POA: Diagnosis present

## 2019-12-09 DIAGNOSIS — Z20822 Contact with and (suspected) exposure to covid-19: Secondary | ICD-10-CM | POA: Diagnosis not present

## 2019-12-09 LAB — SARS CORONAVIRUS 2 (TAT 6-24 HRS): SARS Coronavirus 2: NEGATIVE

## 2019-12-09 NOTE — Progress Notes (Signed)
Pt denies SOB, chest pain, and being under the care of a cardiologist. Pt stated that PCP is Dr. Chesley Mires. Pt denies having a stress test and cardiac cath. Pt denies having an EKG and chest x ray in the last year. Pt denies recent labs. Pt stated that she was instructed to take Aspirin daily due to history of stroke. Pt made aware to stop taking vitamins, fish oil, Cranberry, AZO Blader and herbal medications. Do not take any NSAIDs ie: Ibuprofen, Advil, Naproxen (Aleve), Motrin, BC and Goody Powder. Pt stated that she is no longer on diabetes medication and that she does not check her blood glucose. Pt reminded to quarantine. Pt verbalized understanding of all pre-op instructions.

## 2019-12-10 MED ORDER — DEXTROSE 5 % IV SOLN
3.0000 g | INTRAVENOUS | Status: AC
  Administered 2019-12-11: 3 g via INTRAVENOUS
  Filled 2019-12-10: qty 3

## 2019-12-11 ENCOUNTER — Encounter (HOSPITAL_COMMUNITY)
Admission: RE | Disposition: A | Discharge status: Home / Self Care | Payer: Self-pay | Source: Home / Self Care | Attending: Oral Surgery

## 2019-12-11 ENCOUNTER — Ambulatory Visit (HOSPITAL_COMMUNITY)
Admission: RE | Admit: 2019-12-11 | Discharge: 2019-12-11 | Disposition: A | Discharge status: Home / Self Care | Payer: Medicare Other | Attending: Oral Surgery | Admitting: Oral Surgery

## 2019-12-11 ENCOUNTER — Encounter (HOSPITAL_COMMUNITY): Payer: Self-pay | Admitting: Oral Surgery

## 2019-12-11 ENCOUNTER — Other Ambulatory Visit: Payer: Self-pay

## 2019-12-11 ENCOUNTER — Ambulatory Visit (HOSPITAL_COMMUNITY): Payer: Medicare Other | Admitting: Anesthesiology

## 2019-12-11 DIAGNOSIS — E119 Type 2 diabetes mellitus without complications: Secondary | ICD-10-CM | POA: Insufficient documentation

## 2019-12-11 DIAGNOSIS — F319 Bipolar disorder, unspecified: Secondary | ICD-10-CM | POA: Insufficient documentation

## 2019-12-11 DIAGNOSIS — I129 Hypertensive chronic kidney disease with stage 1 through stage 4 chronic kidney disease, or unspecified chronic kidney disease: Secondary | ICD-10-CM | POA: Diagnosis not present

## 2019-12-11 DIAGNOSIS — Z79899 Other long term (current) drug therapy: Secondary | ICD-10-CM | POA: Insufficient documentation

## 2019-12-11 DIAGNOSIS — F17200 Nicotine dependence, unspecified, uncomplicated: Secondary | ICD-10-CM | POA: Diagnosis not present

## 2019-12-11 DIAGNOSIS — I69898 Other sequelae of other cerebrovascular disease: Secondary | ICD-10-CM | POA: Insufficient documentation

## 2019-12-11 DIAGNOSIS — Z6841 Body Mass Index (BMI) 40.0 and over, adult: Secondary | ICD-10-CM | POA: Diagnosis not present

## 2019-12-11 DIAGNOSIS — Z7982 Long term (current) use of aspirin: Secondary | ICD-10-CM | POA: Insufficient documentation

## 2019-12-11 DIAGNOSIS — K219 Gastro-esophageal reflux disease without esophagitis: Secondary | ICD-10-CM | POA: Diagnosis not present

## 2019-12-11 DIAGNOSIS — N183 Chronic kidney disease, stage 3 unspecified: Secondary | ICD-10-CM | POA: Insufficient documentation

## 2019-12-11 DIAGNOSIS — K0889 Other specified disorders of teeth and supporting structures: Secondary | ICD-10-CM | POA: Insufficient documentation

## 2019-12-11 DIAGNOSIS — H5461 Unqualified visual loss, right eye, normal vision left eye: Secondary | ICD-10-CM | POA: Diagnosis not present

## 2019-12-11 HISTORY — DX: Bipolar disorder, unspecified: F31.9

## 2019-12-11 HISTORY — DX: Dental caries, unspecified: K02.9

## 2019-12-11 HISTORY — DX: Depression, unspecified: F32.A

## 2019-12-11 HISTORY — DX: Gastro-esophageal reflux disease without esophagitis: K21.9

## 2019-12-11 HISTORY — DX: Chronic kidney disease, unspecified: N18.9

## 2019-12-11 HISTORY — PX: TOOTH EXTRACTION: SHX859

## 2019-12-11 LAB — POCT PREGNANCY, URINE: Preg Test, Ur: NEGATIVE

## 2019-12-11 LAB — GLUCOSE, CAPILLARY
Glucose-Capillary: 126 mg/dL — ABNORMAL HIGH (ref 70–99)
Glucose-Capillary: 133 mg/dL — ABNORMAL HIGH (ref 70–99)

## 2019-12-11 SURGERY — EXTRACTION, TOOTH, MOLAR
Anesthesia: General

## 2019-12-11 MED ORDER — PROPOFOL 10 MG/ML IV BOLUS
INTRAVENOUS | Status: DC | PRN
  Administered 2019-12-11: 200 mg via INTRAVENOUS

## 2019-12-11 MED ORDER — SODIUM CHLORIDE 0.9 % IR SOLN
Status: DC | PRN
  Administered 2019-12-11: 1000 mL

## 2019-12-11 MED ORDER — OXYCODONE-ACETAMINOPHEN 5-325 MG PO TABS: 1.0000 | ORAL_TABLET | ORAL | 0 refills | Status: DC | PRN

## 2019-12-11 MED ORDER — LIDOCAINE-EPINEPHRINE (PF) 2 %-1:200000 IJ SOLN
INTRAMUSCULAR | Status: DC | PRN
  Administered 2019-12-11: 10 mL

## 2019-12-11 MED ORDER — AMOXICILLIN 500 MG PO CAPS: 500.0000 mg | ORAL_CAPSULE | Freq: Three times a day (TID) | ORAL | 0 refills | Status: DC

## 2019-12-11 MED ORDER — ONDANSETRON HCL 4 MG/2ML IJ SOLN
INTRAMUSCULAR | Status: DC | PRN
  Administered 2019-12-11: 4 mg via INTRAVENOUS

## 2019-12-11 MED ORDER — OXYCODONE HCL 5 MG PO TABS: 5.0000 mg | ORAL_TABLET | Freq: Once | ORAL | Status: DC | PRN

## 2019-12-11 MED ORDER — MIDAZOLAM HCL 2 MG/2ML IJ SOLN
INTRAMUSCULAR | Status: DC | PRN
  Administered 2019-12-11: 2 mg via INTRAVENOUS

## 2019-12-11 MED ORDER — PROPOFOL 10 MG/ML IV BOLUS
INTRAVENOUS | Status: AC
  Filled 2019-12-11: qty 20

## 2019-12-11 MED ORDER — DEXAMETHASONE SODIUM PHOSPHATE 10 MG/ML IJ SOLN
INTRAMUSCULAR | Status: DC | PRN
  Administered 2019-12-11: 10 mg via INTRAVENOUS

## 2019-12-11 MED ORDER — LIDOCAINE 2% (20 MG/ML) 5 ML SYRINGE
INTRAMUSCULAR | Status: AC
  Filled 2019-12-11: qty 5

## 2019-12-11 MED ORDER — FENTANYL CITRATE (PF) 100 MCG/2ML IJ SOLN: 25.0000 ug | INTRAMUSCULAR | Status: DC | PRN

## 2019-12-11 MED ORDER — MEPERIDINE HCL 25 MG/ML IJ SOLN: 6.2500 mg | INTRAMUSCULAR | Status: DC | PRN

## 2019-12-11 MED ORDER — DEXAMETHASONE SODIUM PHOSPHATE 10 MG/ML IJ SOLN
INTRAMUSCULAR | Status: AC
  Filled 2019-12-11: qty 1

## 2019-12-11 MED ORDER — EPHEDRINE SULFATE-NACL 50-0.9 MG/10ML-% IV SOSY
PREFILLED_SYRINGE | INTRAVENOUS | Status: DC | PRN
  Administered 2019-12-11: 5 mg via INTRAVENOUS

## 2019-12-11 MED ORDER — PHENYLEPHRINE 40 MCG/ML (10ML) SYRINGE FOR IV PUSH (FOR BLOOD PRESSURE SUPPORT)
PREFILLED_SYRINGE | INTRAVENOUS | Status: DC | PRN
  Administered 2019-12-11: 120 ug via INTRAVENOUS
  Administered 2019-12-11: 200 ug via INTRAVENOUS
  Administered 2019-12-11: 120 ug via INTRAVENOUS

## 2019-12-11 MED ORDER — LIDOCAINE 2% (20 MG/ML) 5 ML SYRINGE
INTRAMUSCULAR | Status: DC | PRN
  Administered 2019-12-11: 80 mg via INTRAVENOUS

## 2019-12-11 MED ORDER — OXYMETAZOLINE HCL 0.05 % NA SOLN
NASAL | Status: DC | PRN
  Administered 2019-12-11: 2 via NASAL

## 2019-12-11 MED ORDER — SODIUM CHLORIDE 0.9 % IV SOLN: INTRAVENOUS | Status: DC

## 2019-12-11 MED ORDER — FENTANYL CITRATE (PF) 250 MCG/5ML IJ SOLN
INTRAMUSCULAR | Status: DC | PRN
  Administered 2019-12-11: 50 ug via INTRAVENOUS
  Administered 2019-12-11: 100 ug via INTRAVENOUS

## 2019-12-11 MED ORDER — ONDANSETRON HCL 4 MG/2ML IJ SOLN
INTRAMUSCULAR | Status: AC
  Filled 2019-12-11: qty 2

## 2019-12-11 MED ORDER — LACTATED RINGERS IV SOLN: INTRAVENOUS | Status: DC

## 2019-12-11 MED ORDER — OXYCODONE HCL 5 MG/5ML PO SOLN: 5.0000 mg | Freq: Once | ORAL | Status: DC | PRN

## 2019-12-11 MED ORDER — ONDANSETRON HCL 4 MG/2ML IJ SOLN: 4.0000 mg | Freq: Once | INTRAMUSCULAR | Status: DC | PRN

## 2019-12-11 MED ORDER — FENTANYL CITRATE (PF) 250 MCG/5ML IJ SOLN
INTRAMUSCULAR | Status: AC
  Filled 2019-12-11: qty 5

## 2019-12-11 MED ORDER — MIDAZOLAM HCL 2 MG/2ML IJ SOLN
INTRAMUSCULAR | Status: AC
  Filled 2019-12-11: qty 2

## 2019-12-11 MED ORDER — LIDOCAINE-EPINEPHRINE 2 %-1:100000 IJ SOLN
INTRAMUSCULAR | Status: AC
  Filled 2019-12-11: qty 1

## 2019-12-11 MED ORDER — OXYMETAZOLINE HCL 0.05 % NA SOLN
NASAL | Status: AC
  Filled 2019-12-11: qty 30

## 2019-12-11 MED FILL — AMOXICILLIN 500 MG CAPSULE: 500 | 7 days supply | Qty: 21 | Fill #0

## 2019-12-11 SURGICAL SUPPLY — 32 items
BLADE SURG 15 STRL LF DISP TIS (BLADE) ×1 IMPLANT
BLADE SURG 15 STRL SS (BLADE) ×2
BUR CROSS CUT FISSURE 1.6 (BURR) ×2 IMPLANT
BUR CROSS CUT FISSURE 1.6MM (BURR) ×1
BUR EGG ELITE 4.0 (BURR) ×1 IMPLANT
BUR EGG ELITE 4.0MM (BURR) ×1
CANISTER SUCT 3000ML PPV (MISCELLANEOUS) ×3 IMPLANT
COVER SURGICAL LIGHT HANDLE (MISCELLANEOUS) ×3 IMPLANT
COVER WAND RF STERILE (DRAPES) ×3 IMPLANT
DECANTER SPIKE VIAL GLASS SM (MISCELLANEOUS) IMPLANT
DRAPE U-SHAPE 76X120 STRL (DRAPES) ×3 IMPLANT
GAUZE 4X4 16PLY RFD (DISPOSABLE) IMPLANT
GAUZE PACKING FOLDED 2  STR (GAUZE/BANDAGES/DRESSINGS) ×2
GAUZE PACKING FOLDED 2 STR (GAUZE/BANDAGES/DRESSINGS) ×1 IMPLANT
GLOVE BIO SURGEON STRL SZ7.5 (GLOVE) ×3 IMPLANT
GOWN STRL REUS W/ TWL XL LVL3 (GOWN DISPOSABLE) ×1 IMPLANT
GOWN STRL REUS W/TWL XL LVL3 (GOWN DISPOSABLE) ×2
KIT BASIN OR (CUSTOM PROCEDURE TRAY) ×3 IMPLANT
KIT TURNOVER KIT B (KITS) ×3 IMPLANT
NEEDLE 22X1 1/2 (OR ONLY) (NEEDLE) ×3 IMPLANT
NS IRRIG 1000ML POUR BTL (IV SOLUTION) ×3 IMPLANT
PAD ARMBOARD 7.5X6 YLW CONV (MISCELLANEOUS) ×6 IMPLANT
SLEEVE IRRIGATION ELITE 7 (MISCELLANEOUS) ×2 IMPLANT
SUT CHROMIC 3 0 PS 2 (SUTURE) ×3 IMPLANT
SUT CHROMIC 4 0 P 3 18 (SUTURE) IMPLANT
SUT VIC AB 3-0 PS2 18 (SUTURE) ×4
SUT VIC AB 3-0 PS2 18XBRD (SUTURE) ×2 IMPLANT
SYR CONTROL 10ML LL (SYRINGE) ×3 IMPLANT
TOWEL GREEN STERILE (TOWEL DISPOSABLE) ×3 IMPLANT
TRAY ENT MC OR (CUSTOM PROCEDURE TRAY) ×3 IMPLANT
TUBING IRRIGATION (MISCELLANEOUS) ×3 IMPLANT
YANKAUER SUCT BULB TIP NO VENT (SUCTIONS) ×3 IMPLANT

## 2019-12-11 NOTE — Op Note (Signed)
12/11/2019  9:49 AM  PATIENT:  Nash Mantis  49 y.o. female  PRE-OPERATIVE DIAGNOSIS:  NON RESTORABLE TEETH #3, 4, 11, 12, 13, 14  POST-OPERATIVE DIAGNOSIS:  SAME  PROCEDURE:  Procedure(s): EXTRACTION TEETH #3, 4, 11, 12, 13, 14, ALVEOLOPLASTY RIGHT AND LEFT MAXILLA  SURGEON:  Surgeon(s): Ocie Doyne, DDS  ANESTHESIA:   local and general  EBL:  minimal  DRAINS: none   SPECIMEN:  No Specimen  COUNTS:  YES  PLAN OF CARE: Discharge to home after PACU  PATIENT DISPOSITION:  PACU - hemodynamically stable.   PROCEDURE DETAILS: Dictation # [id number]  Georgia Lopes, DMD 12/11/2019 9:49 AM
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
## 2019-12-11 NOTE — Op Note (Signed)
NAME: Kendra Vega, SALINE [facility] MEDICAL RECORD [id number] ACCOUNT [id number] DATE OF BIRTH:1971/03/22 FACILITY: [facility] LOCATION: [facility] PHYSICIAN:Trae Bovenzi M. Samariah Hokenson, DDS  OPERATIVE REPORT  DATE OF PROCEDURE:  12/11/2019  PREOPERATIVE DIAGNOSIS:  Nonrestorable teeth numbers 3, 4, 11, 12, 13, 14.  POSTOPERATIVE DIAGNOSIS:  Nonrestorable teeth numbers 3, 4, 11, 12, 13, 14.  PROCEDURE:  Extraction teeth 3, 4, 11, 12, 13, 14, alveoplasty right and left maxilla.  SURGEON:  Ocie Doyne, DDS  ANESTHESIA:  General, nasal intubation.  [facility] attending.  DESCRIPTION OF PROCEDURE:  The patient was taken to the operating room and placed on the table in supine position.  General anesthesia was administered intravenously and a nasal endotracheal tube was placed and secured.  The eyes were protected and the  patient was draped for surgery.  A timeout was performed.  The posterior pharynx was suctioned and a throat pack was placed.  2% lidocaine with 1:100,000 epinephrine was infiltrated buccally and palatally around the teeth to be removed in the maxilla.   Total of 10 mL was utilized.  A bite block was placed on the right side of the mouth and a sweetheart retractor was used to retract the tongue.  A #15 blade used to make an incision 1 cm proximal to tooth #14 along the alveolar crest and then it was  carried forward in the gingival sulcus both buccally and palatally until tooth #11 was encountered.  The incision was extended 1 cm distally along the alveolar crest.  The periosteum was reflected from around these teeth.  The teeth were elevated with a  301 elevator and removed from the mouth with the dental forceps.  Tooth #12 fractured upon attempted removal and a root tip was left in the palatal socket.  The Stryker handpiece with fissure bur under irrigation was used to remove bone around this tooth  and then the root tip was removed.  Then, the periosteum was reflected to further expose the alveolar crest,  which was irregular in contour.  Alveoplasty was performed using an egg bur and then a bone file.  Then, the area was irrigated and closed with  3-0 chromic.  Then, the bite block and sweetheart retractor were repositioned to the other side of the mouth.  A 15 blade was used to make an incision around teeth numbers 3 and 4 with Crest extensions of 1 cm both proximally and distally.  The  periosteum was reflected from around these teeth.  The teeth were elevated.  Tooth #4 was removed with the dental forceps.  Tooth #3 required sectioning and removal of each root independently using a Stryker handpiece with fissure bur and rongeurs.   Then, the sockets were curetted.  The periosteum was reflected to expose the alveolar crest.  Bone was thinned and fractured on the buccal aspect, so alveoplasty was performed using an egg-shaped bur and then a bone file.  Then, the area was irrigated  and closed with 3-0 chromic.  Then, the oral cavity was irrigated and suctioned and throat pack was removed.  The patient was left in care of anesthesia for extubation and transport to recovery room with plans for discharge home through day surgery.  ESTIMATED BLOOD LOSS:  Minimal.  COMPLICATIONS:  None.  SPECIMENS:  None.  CN/NUANCE  D:12/11/2019 T:12/11/2019 JOB:[id number]

## 2019-12-11 NOTE — Anesthesia Preprocedure Evaluation (Addendum)
Anesthesia Evaluation  Patient identified by MRN, date of birth, ID band Patient awake    Reviewed: Allergy & Precautions, NPO status , Patient's Chart, lab work & pertinent test results, reviewed documented beta blocker date and time   Airway Mallampati: III  TM Distance: >3 FB Neck ROM: Full    Dental  (+) Poor Dentition, Missing,    Pulmonary Current Smoker,    Pulmonary exam normal breath sounds clear to auscultation       Cardiovascular hypertension, Pt. on medications and Pt. on home beta blockers Normal cardiovascular exam Rhythm:Regular Rate:Normal     Neuro/Psych  Headaches, PSYCHIATRIC DISORDERS Anxiety Depression Bipolar Disorder Blind OD Central retinal artery occlusion CVA, No Residual Symptoms    GI/Hepatic GERD  Medicated and Controlled,(+)     substance abuse  marijuana use, Non restorable teeth   Endo/Other  diabetes, Well Controlled, Type 2, Oral Hypoglycemic AgentsMorbid obesityHyperlipidemia  Renal/GU Renal InsufficiencyRenal disease  negative genitourinary   Musculoskeletal negative musculoskeletal ROS (+)   Abdominal (+) + obese,   Peds  Hematology negative hematology ROS (+)   Anesthesia Other Findings   Reproductive/Obstetrics                           Anesthesia Physical Anesthesia Plan  ASA: III  Anesthesia Plan: General   Post-op Pain Management:    Induction: Intravenous  PONV Risk Score and Plan: 2 and Ondansetron, Treatment may vary due to age or medical condition, Dexamethasone and Midazolam  Airway Management Planned: Nasal ETT  Additional Equipment:   Intra-op Plan:   Post-operative Plan: Extubation in OR  Informed Consent: I have reviewed the patients History and Physical, chart, labs and discussed the procedure including the risks, benefits and alternatives for the proposed anesthesia with the patient or authorized representative who has  indicated his/her understanding and acceptance.     Dental advisory given  Plan Discussed with: CRNA and Surgeon  Anesthesia Plan Comments:         Anesthesia Quick Evaluation

## 2019-12-11 NOTE — H&P (Signed)
Anesthesia H&P Update: History and Physical Exam reviewed; patient is OK for planned anesthetic and procedure. ? ?

## 2019-12-11 NOTE — Anesthesia Postprocedure Evaluation (Signed)
Anesthesia Post Note  Patient: Roselia Snipe  Procedure(s) Performed: EXTRACTION MOLARS (N/A )     Patient location during evaluation: PACU Anesthesia Type: General Level of consciousness: awake and alert and oriented Pain management: pain level controlled Vital Signs Assessment: post-procedure vital signs reviewed and stable Respiratory status: spontaneous breathing, nonlabored ventilation and respiratory function stable Cardiovascular status: blood pressure returned to baseline and stable Postop Assessment: no apparent nausea or vomiting Anesthetic complications: no    Last Vitals:  Vitals:   12/11/19 1031 12/11/19 1035  BP:    Pulse:  81  Resp:  16  Temp:    SpO2: 100% 95%    Last Pain:  Vitals:   12/11/19 1031  PainSc: 0-No pain                 Kendy Haston A.

## 2019-12-11 NOTE — Transfer of Care (Signed)
Immediate Anesthesia Transfer of Care Note  Patient: Kendra Vega  Procedure(s) Performed: EXTRACTION MOLARS (N/A )  Patient Location: PACU  Anesthesia Type:General  Level of Consciousness: drowsy and patient cooperative  Airway & Oxygen Therapy: Patient Spontanous Breathing and Patient connected to face mask oxygen  Post-op Assessment: Report given to RN and Post -op Vital signs reviewed and stable  Post vital signs: Reviewed and stable  Last Vitals:  Vitals Value Taken Time  BP 141/81 12/11/19 0958  Temp    Pulse 88 12/11/19 0959  Resp 17 12/11/19 0959  SpO2 96 % 12/11/19 0959  Vitals shown include unvalidated device data.  Last Pain: There were no vitals filed for this visit.       Complications: No apparent anesthesia complications

## 2019-12-11 NOTE — Anesthesia Procedure Notes (Signed)
Procedure Name: Intubation Date/Time: 12/11/2019 9:15 AM Performed by: Bryson Corona, CRNA Pre-anesthesia Checklist: Patient identified, Emergency Drugs available, Suction available and Patient being monitored Patient Re-evaluated:Patient Re-evaluated prior to induction Oxygen Delivery Method: Circle System Utilized Preoxygenation: Pre-oxygenation with 100% oxygen Induction Type: IV induction Ventilation: Mask ventilation without difficulty Laryngoscope Size: Mac and 3 Grade View: Grade I Nasal Tubes: Nasal Rae, Nasal prep performed and Magill forceps- large, utilized Tube size: 6.5 mm Number of attempts: 1 Placement Confirmation: ETT inserted through vocal cords under direct vision,  positive ETCO2 and breath sounds checked- equal and bilateral Tube secured with: Tape Dental Injury: Teeth and Oropharynx as per pre-operative assessment

## 2019-12-15 ENCOUNTER — Other Ambulatory Visit: Payer: Self-pay | Admitting: Internal Medicine

## 2019-12-15 DIAGNOSIS — R252 Cramp and spasm: Secondary | ICD-10-CM

## 2019-12-15 MED FILL — METHOCARBAMOL 500 MG TABS: 500 | 15 days supply | Qty: 30 | Fill #0

## 2019-12-15 NOTE — H&P (Signed)
HISTORY AND PHYSICAL  Kendra Vega is a 49 y.o. female patient with CC: painful teeth  No diagnosis found.  Past Medical History:  Diagnosis Date  . Bipolar disorder ([facility])   . 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 Breast mass    rt breast ?abcess x's 1 week  . Chronic kidney disease   . Dental caries   . Depression   . GERD (gastroesophageal reflux disease)   . Hypertension   . Stroke ([facility])   . Type 2 diabetes mellitus without complication, without long-term current use of insulin ([facility]) 10/21/2018    No current facility-administered medications for this encounter.   Current Outpatient Medications  Medication Sig Dispense Refill  . acetaminophen (TYLENOL) 500 MG tablet Take 1,000 mg by mouth every 6 (six) hours as needed for moderate pain.     Marland Kitchen amLODipine (NORVASC) 10 MG tablet TAKE 1 TABLET BY MOUTH DAILY. (Patient taking differently: Take 10 mg by mouth daily. ) 90 tablet 0  . aspirin EC 325 MG EC tablet Take 1 tablet (325 mg total) by mouth daily. 30 tablet 0  . atorvastatin (LIPITOR) 20 MG tablet TAKE 1 TABLET (20 MG TOTAL) BY MOUTH DAILY AT 6 PM. 30 tablet 6  . CRANBERRY PO Take 1 tablet by mouth every other day.    . esomeprazole (NEXIUM) 20 MG capsule TAKE 1 CAPSULE (20 MG TOTAL) BY MOUTH DAILY. 90 capsule 1  . furosemide (LASIX) 80 MG tablet TAKE 1 TABLET (80 MG TOTAL) BY MOUTH DAILY. 90 tablet 1  . hydrocortisone cream 1 % Apply 1 application topically 2 (two) times daily as needed for itching.    . labetalol (NORMODYNE) 100 MG tablet Take 1 tablet (100 mg total) by mouth 2 (two) times daily. 60 tablet 5  . lisinopril (ZESTRIL) 10 MG tablet TAKE 1 TABLET (10 MG TOTAL) BY MOUTH DAILY. 90 tablet 1  . Omega-3 Fatty Acids (FISH OIL) 1000 MG CAPS Take 1,000 mg by mouth daily.    . Pumpkin Seed-Soy Germ (AZO BLADDER CONTROL/GO-LESS PO) Take 1 tablet by mouth 2 (two) times daily.    Marland Kitchen amoxicillin (AMOXIL) 500 MG capsule Take 1 capsule (500 mg total) by mouth 3  (three) times daily. 21 capsule 0  . Blood Glucose Monitoring Suppl (TRUE METRIX METER) DEVI 1 each by Does not apply route daily. 1 Device 0  . clopidogrel (PLAVIX) 75 MG tablet Take 1 tablet (75 mg total) by mouth daily. (Patient not taking: Reported on 12/08/2019) 30 tablet 6  . glucose blood (TRUE METRIX BLOOD GLUCOSE TEST) test strip Use once daily 100 each 12  . hydrOXYzine (ATARAX/VISTARIL) 25 MG tablet Take 1 tablet (25 mg total) by mouth 3 (three) times daily. (Patient not taking: Reported on 12/08/2019) 90 tablet 6  . lurasidone (LATUDA) 20 MG TABS tablet Take 1 tablet (20 mg total) by mouth at bedtime. (Patient not taking: Reported on 12/08/2019) 60 tablet 6  . methocarbamol (ROBAXIN) 500 MG tablet Take 2 tablets (1,000 mg total) by mouth daily. 30 tablet 2  . oxyCODONE-acetaminophen (PERCOCET) 5-325 MG tablet Take 1 tablet by mouth every 4 (four) hours as needed. 24 tablet 0  . TRUEPLUS LANCETS 28G MISC 1 each by Does not apply route daily. 100 each 12   No Known Allergies Active Problems:   * No active hospital problems. *  Vitals: Blood pressure 133/83, pulse 81, temperature (!) 97.3 F (36.3 C), resp. rate 16, height 5'  5" (1.651 m), weight 114.8 kg, last menstrual period 12/07/2019, SpO2 95 %. Lab results:No results found for this or any previous visit (from the past 7 hour(s)). Radiology Results: No results found. General appearance: alert, cooperative and morbidly obese Head: Normocephalic, without obvious abnormality, atraumatic Eyes: negative Nose: Nares normal. Septum midline. Mucosa normal. No drainage or sinus tenderness. Throat: Dental caries #3, 4, 11, 12, 13, 14. No edema, fluctuance, trismus. Pharynx clear. Neck: no adenopathy, supple, symmetrical, trachea midline and thyroid not enlarged, symmetric, no tenderness/mass/nodules Resp: clear to auscultation bilaterally Cardio: regular rate and rhythm, S1, S2 normal, no murmur, click, rub or  gallop  Assessment:Non-restorable teeth #3, 4, 11, 12, 13, 14  Plan: Dental extractions with alveoloplasty.[facility]   Diona Browner

## 2019-12-18 ENCOUNTER — Ambulatory Visit: Payer: Medicaid Other | Admitting: Podiatry

## 2019-12-22 MED FILL — ATORVASTATIN CALCIUM 20 MG: 20 | 30 days supply | Qty: 30 | Fill #4

## 2019-12-22 MED FILL — AMLODIPINE BESYLATE 10 MG T: 10 | 30 days supply | Qty: 30 | Fill #0

## 2019-12-29 MED FILL — METHOCARBAMOL 500 MG TABS: 500 | 15 days supply | Qty: 30 | Fill #1

## 2020-01-01 ENCOUNTER — Other Ambulatory Visit: Payer: Self-pay

## 2020-01-01 ENCOUNTER — Ambulatory Visit (INDEPENDENT_AMBULATORY_CARE_PROVIDER_SITE_OTHER): Payer: Medicare Other | Admitting: Podiatry

## 2020-01-01 DIAGNOSIS — M79675 Pain in left toe(s): Secondary | ICD-10-CM | POA: Diagnosis not present

## 2020-01-01 DIAGNOSIS — B351 Tinea unguium: Secondary | ICD-10-CM | POA: Diagnosis not present

## 2020-01-01 DIAGNOSIS — E119 Type 2 diabetes mellitus without complications: Secondary | ICD-10-CM

## 2020-01-01 DIAGNOSIS — M79676 Pain in unspecified toe(s): Secondary | ICD-10-CM

## 2020-01-01 DIAGNOSIS — M79671 Pain in right foot: Secondary | ICD-10-CM

## 2020-01-01 DIAGNOSIS — M79672 Pain in left foot: Secondary | ICD-10-CM

## 2020-01-01 DIAGNOSIS — Q828 Other specified congenital malformations of skin: Secondary | ICD-10-CM

## 2020-01-04 ENCOUNTER — Encounter: Payer: Self-pay | Admitting: Podiatry

## 2020-01-04 ENCOUNTER — Encounter: Payer: Self-pay | Admitting: Internal Medicine

## 2020-01-04 ENCOUNTER — Ambulatory Visit (INDEPENDENT_AMBULATORY_CARE_PROVIDER_SITE_OTHER): Payer: Medicare Other | Admitting: Internal Medicine

## 2020-01-04 ENCOUNTER — Other Ambulatory Visit: Payer: Self-pay

## 2020-01-04 VITALS — BP 160/85 | HR 84 | Temp 98.9°F | Wt 249.9 lb

## 2020-01-04 DIAGNOSIS — I10 Essential (primary) hypertension: Secondary | ICD-10-CM

## 2020-01-04 DIAGNOSIS — Z8673 Personal history of transient ischemic attack (TIA), and cerebral infarction without residual deficits: Secondary | ICD-10-CM

## 2020-01-04 DIAGNOSIS — F319 Bipolar disorder, unspecified: Secondary | ICD-10-CM | POA: Diagnosis present

## 2020-01-04 MED ORDER — CLOPIDOGREL BISULFATE 75 MG PO TABS: 75.0000 mg | ORAL_TABLET | Freq: Every day | ORAL | 6 refills | Status: DC

## 2020-01-04 MED ORDER — LISINOPRIL 10 MG PO TABS: 10.0000 mg | ORAL_TABLET | Freq: Every day | ORAL | 1 refills | Status: DC

## 2020-01-04 MED ORDER — AMLODIPINE BESYLATE 10 MG PO TABS: 10.0000 mg | ORAL_TABLET | Freq: Every day | ORAL | 0 refills | Status: DC

## 2020-01-04 MED ORDER — FUROSEMIDE 80 MG PO TABS: 80.0000 mg | ORAL_TABLET | Freq: Every day | ORAL | 1 refills | Status: DC

## 2020-01-04 MED ORDER — ATORVASTATIN CALCIUM 20 MG PO TABS: 20.0000 mg | ORAL_TABLET | Freq: Every day | ORAL | 6 refills | Status: DC

## 2020-01-04 MED ORDER — LABETALOL HCL 100 MG PO TABS: 100.0000 mg | ORAL_TABLET | Freq: Two times a day (BID) | ORAL | 5 refills | Status: DC

## 2020-01-04 MED ORDER — ASPIRIN 325 MG PO TBEC: 325.0000 mg | DELAYED_RELEASE_TABLET | Freq: Every day | ORAL | 0 refills | Status: DC

## 2020-01-04 MED FILL — AMLODIPINE BESYLATE 10 MG T: 10 | 90 days supply | Qty: 90 | Fill #0

## 2020-01-04 MED FILL — CLOPIDOGREL 75 MG TABLET: 75 | 30 days supply | Qty: 30 | Fill #0

## 2020-01-04 MED FILL — FUROSEMIDE 80 MG TAB: 80 | 90 days supply | Qty: 90 | Fill #0

## 2020-01-04 MED FILL — LISINOPRIL 10 MG TABS: 10 | 90 days supply | Qty: 90 | Fill #0

## 2020-01-04 MED FILL — ATORVASTATIN 20 MG TABLET: 20 | 30 days supply | Qty: 30 | Fill #0

## 2020-01-04 MED FILL — LABETALOL HCL 100MG TABLET: 100 | 30 days supply | Qty: 60 | Fill #0

## 2020-01-04 NOTE — Progress Notes (Signed)
Subjective:  Patient ID: Kendra Vega, female    DOB: 03/02/1971,  MRN: [id number]  Chief Complaint  Patient presents with  . Callouses    pt is here for bil callus pain, pt has no other comments or concerns    49 y.o. female presents with the above complaint.  States that these callus has been painful in nature.  She has done soaks which has not helped.  Tried all conservative therapy which none of them have helped.  He states that she does not know what else to do.  And they have been painful while ambulating.  She denies any nausea fever chills vomiting.   Review of Systems: Negative except as noted in the HPI. Denies N/V/F/Ch.  Past Medical History:  Diagnosis Date  . Bipolar disorder ([place])   . 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 Breast mass    rt breast ?abcess x's 1 week  . Chronic kidney disease   . Dental caries   . Depression   . GERD (gastroesophageal reflux disease)   . Hypertension   . Stroke ([place])   . Type 2 diabetes mellitus without complication, without long-term current use of insulin ([place]) 10/21/2018    Current Outpatient Medications:  .  acetaminophen (TYLENOL) 500 MG tablet, Take 1,000 mg by mouth every 6 (six) hours as needed for moderate pain. , Disp: , Rfl:  .  amLODipine (NORVASC) 10 MG tablet, TAKE 1 TABLET BY MOUTH DAILY. (Patient taking differently: Take 10 mg by mouth daily. ), Disp: 90 tablet, Rfl: 0 .  amoxicillin (AMOXIL) 500 MG capsule, Take 1 capsule (500 mg total) by mouth 3 (three) times daily., Disp: 21 capsule, Rfl: 0 .  aspirin EC 325 MG EC tablet, Take 1 tablet (325 mg total) by mouth daily., Disp: 30 tablet, Rfl: 0 .  atorvastatin (LIPITOR) 20 MG tablet, TAKE 1 TABLET (20 MG TOTAL) BY MOUTH DAILY AT 6 PM., Disp: 30 tablet, Rfl: 6 .  Blood Glucose Monitoring Suppl (TRUE METRIX METER) DEVI, 1 each by Does not apply route daily., Disp: 1 Device, Rfl: 0 .  clopidogrel (PLAVIX) 75 MG tablet, Take 1 tablet (75 mg total) by mouth  daily., Disp: 30 tablet, Rfl: 6 .  CRANBERRY PO, Take 1 tablet by mouth every other day., Disp: , Rfl:  .  esomeprazole (NEXIUM) 20 MG capsule, TAKE 1 CAPSULE (20 MG TOTAL) BY MOUTH DAILY., Disp: 90 capsule, Rfl: 1 .  furosemide (LASIX) 80 MG tablet, TAKE 1 TABLET (80 MG TOTAL) BY MOUTH DAILY., Disp: 90 tablet, Rfl: 1 .  glucose blood (TRUE METRIX BLOOD GLUCOSE TEST) test strip, Use once daily, Disp: 100 each, Rfl: 12 .  hydrocortisone cream 1 %, Apply 1 application topically 2 (two) times daily as needed for itching., Disp: , Rfl:  .  hydrOXYzine (ATARAX/VISTARIL) 25 MG tablet, Take 1 tablet (25 mg total) by mouth 3 (three) times daily., Disp: 90 tablet, Rfl: 6 .  lurasidone (LATUDA) 20 MG TABS tablet, Take 1 tablet (20 mg total) by mouth at bedtime., Disp: 60 tablet, Rfl: 6 .  methocarbamol (ROBAXIN) 500 MG tablet, Take 2 tablets (1,000 mg total) by mouth daily., Disp: 30 tablet, Rfl: 2 .  Omega-3 Fatty Acids (FISH OIL) 1000 MG CAPS, Take 1,000 mg by mouth daily., Disp: , Rfl:  .  oxyCODONE-acetaminophen (PERCOCET) 5-325 MG tablet, Take 1 tablet by mouth every 4 (four) hours as needed., Disp: 24 tablet, Rfl: 0 .  Pumpkin  Seed-Soy Germ (AZO BLADDER CONTROL/GO-LESS PO), Take 1 tablet by mouth 2 (two) times daily., Disp: , Rfl:  .  TRUEPLUS LANCETS 28G MISC, 1 each by Does not apply route daily., Disp: 100 each, Rfl: 12 .  labetalol (NORMODYNE) 100 MG tablet, Take 1 tablet (100 mg total) by mouth 2 (two) times daily., Disp: 60 tablet, Rfl: 5 .  lisinopril (ZESTRIL) 10 MG tablet, TAKE 1 TABLET (10 MG TOTAL) BY MOUTH DAILY., Disp: 90 tablet, Rfl: 1  Social History   Tobacco Use  Smoking Status Current Every Day Smoker  . Packs/day: 0.20  . Years: 7.00  . Pack years: 1.40  Smokeless Tobacco Never Used  Tobacco Comment   smokes " Black and Milds " daily     No Known Allergies Objective:  There were no vitals filed for this visit. There is no height or weight on file to calculate  BMI. Constitutional Well developed. Well nourished.  Vascular Dorsalis pedis pulses palpable bilaterally. Posterior tibial pulses palpable bilaterally. Capillary refill normal to all digits.  No cyanosis or clubbing noted. Pedal hair growth normal.  Neurologic Normal speech. Oriented to person, place, and time. Epicritic sensation to light touch grossly present bilaterally.  Dermatologic  keratotic lesion noted at the plantar hindfoot of the left foot and right hallux IPJ.  Pain on palpation to the lesions. Nail Exam: Pt has thick disfigured discolored nails with subungual debris noted bilateral entire nail hallux through fifth toenails  Orthopedic: Normal joint ROM without pain or crepitus bilaterally. No visible deformities. No bony tenderness.   Radiographs: None Assessment:   1. Porokeratosis   2. Bilateral foot pain   3. Pain due to onychomycosis of toenails of both feet   4. Type 2 diabetes mellitus without complication, without long-term current use of insulin (H[facility])    Plan:  Patient was evaluated and treated and all questions answered.  Porokeratosis bilaterally left plantar midfoot and right hallux IPJ -Using a chisel blade, the hyperkeratotic lesions were debrided down to granular striated tissue.  No complications noted.  No pinpoint bleeding noted. -Upon adequate debridement patient felt immediate relief. I explained to the patient the etiology and all the treatment options available for treating porokeratosis  Onychomycosis with pain  -Nails palliatively debrided as below. -Educated on self-care  Procedure: Nail Debridement Rationale: pain  Type of Debridement: manual, sharp debridement. Instrumentation: Nail nipper, rotary burr. Number of Nails: 10  Procedures and Treatment: Consent by patient was obtained for treatment procedures. The patient understood the discussion of treatment and procedures well. All questions were answered thoroughly reviewed.  Debridement of mycotic and hypertrophic toenails, 1 through 5 bilateral and clearing of subungual debris. No ulceration, no infection noted.  Return Visit-Office Procedure: Patient instructed to return to the office for a follow up visit 3 months for continued evaluation and treatment.  Nicholes Rough, DPM    No follow-ups on file.

## 2020-01-04 NOTE — Patient Instructions (Signed)
Ms. Tinner, I'm sorry to hear you are still struggling with depression and grief. I have referred you to [facility] our in-house counselor who should be able to help talk to you and get you in with a different psychiatrist.   I have refilled all of your medications today. We will not make any changes. I will look for your follow-up visit with Dr. Hyman Hopes to be sent over.   Remember to let me know if there's anything I can do to help with your disability paperwork.   Take care, and I'll see you in a few months.  Dr. Chesley Mires

## 2020-01-05 ENCOUNTER — Encounter: Payer: Self-pay | Admitting: Internal Medicine

## 2020-01-05 NOTE — Assessment & Plan Note (Signed)
Current regimen consists of Labetalol, Lisinopril, and Lasix. Recently saw nephrology who agreed with current regimen and has follow-up again with them this month. She is more elevated than I would like, but her anxiety, depression and grief are likely playing a role. Will not make any adjustments to regimen today. Refills for medications sent.

## 2020-01-05 NOTE — Assessment & Plan Note (Signed)
>>  ASSESSMENT AND PLAN FOR BIPOLAR DEPRESSION ([place]) WRITTEN ON 01/05/2020  1:34 PM BY BLOOMFIELD, CARLEY D, DO  Patient currently followed at [facility]. She does not have her meds with her today and is unable to tell me what she is on. She is unhappy with her care there and requests referral to a different psychiatric specialist. Referral placed today. Her symptoms have been poorly controlled complicated by the severe grief of losing her son recently. Her PHQ-9 score is 25 today. She denies any active SI/HI. Will also send referral to [facility] to offer counseling services, as well as assist with referral process of getting her in as quickly as possible with psychiatry.

## 2020-01-05 NOTE — Progress Notes (Signed)
   CC: depression, HTN  HPI:  Kendra Vega is a 49 y.o. female with PMHx listed below who presents for follow-up on depression complicated by acute grief and HTN. Please see problem based charting for further details.   Past Medical History:  Diagnosis Date  . Bipolar disorder (HCC)   . 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 Breast mass    rt breast ?abcess x's 1 week  . Chronic kidney disease   . Dental caries   . Depression   . GERD (gastroesophageal reflux disease)   . Hypertension   . Stroke ([facility])   . Type 2 diabetes mellitus without complication, without long-term current use of insulin (H[facility]) 10/21/2018   Review of Systems:  Review of Systems  Constitutional: Negative for chills and fever.  HENT: Negative for congestion and sore throat.   Eyes: Negative for blurred vision and double vision.  Respiratory: Negative for shortness of breath.   Cardiovascular: Negative for chest pain, palpitations and leg swelling.  Gastrointestinal: Negative for constipation and diarrhea.  Genitourinary: Negative for hematuria.  Musculoskeletal: Negative for falls.  Skin: Negative for rash.  Neurological: Negative for dizziness, sensory change and focal weakness.  Psychiatric/Behavioral: Positive for depression. Negative for substance abuse and suicidal ideas. The patient has insomnia.     Physical Exam:  Vitals:   01/04/20 1432  BP: (!) 160/85  Pulse: 84  Temp: 98.9 F (37.2 C)  TempSrc: Oral  SpO2: 100%  Weight: 249 lb 14.4 oz (113.4 kg)   Physical Exam Constitutional:      General: She is not in acute distress.    Appearance: Normal appearance.  Eyes:     Conjunctiva/sclera: Conjunctivae normal.  Cardiovascular:     Rate and Rhythm: Normal rate and regular rhythm.     Pulses: Normal pulses.  Pulmonary:     Effort: Pulmonary effort is normal.     Breath sounds: Normal breath sounds.  Abdominal:     Palpations: Abdomen is soft.     Tenderness: There is no  abdominal tenderness. There is no guarding.  Musculoskeletal:     Cervical back: Neck supple.     Right lower leg: No edema.     Left lower leg: No edema.  Lymphadenopathy:     Cervical: No cervical adenopathy.  Skin:    General: Skin is warm and dry.  Neurological:     General: No focal deficit present.     Mental Status: She is alert and oriented to person, place, and time.  Psychiatric:        Attention and Perception: Attention normal.        Mood and Affect: Mood is depressed. Affect is tearful.        Speech: Speech normal.        Behavior: Behavior normal.        Thought Content: Thought content normal.        Cognition and Memory: Cognition normal.        Judgment: Judgment normal.    Assessment & Plan:   See Encounters Tab for problem based charting.  Patient discussed with Dr. Sandre Kitty

## 2020-01-05 NOTE — Assessment & Plan Note (Signed)
Patient currently followed at [facility]. She does not have her meds with her today and is unable to tell me what she is on. She is unhappy with her care there and requests referral to a different psychiatric specialist. Referral placed today. Her symptoms have been poorly controlled complicated by the severe grief of losing her son recently. Her PHQ-9 score is 25 today. She denies any active SI/HI. Will also send referral to [facility] to offer counseling services, as well as assist with referral process of getting her in as quickly as possible with psychiatry.

## 2020-01-06 NOTE — Progress Notes (Signed)
Internal Medicine Clinic Attending  Case discussed with Dr. Bloomfield at the time of the visit.  We reviewed the resident's history and exam and pertinent patient test results.  I agree with the assessment, diagnosis, and plan of care documented in the resident's note.  Kyrstin Campillo, M.D., Ph.D.  

## 2020-01-12 ENCOUNTER — Telehealth: Payer: Self-pay | Admitting: Licensed Clinical Social Worker

## 2020-01-12 MED FILL — METHOCARBAMOL 500 MG TABS: 500 | 15 days supply | Qty: 30 | Fill #2

## 2020-01-12 NOTE — Telephone Encounter (Signed)
Patient was contacted to discuss a referral for counseling. Patient agreed to services, and will be added to my schedule for 2/24 @ 11:30.

## 2020-01-13 ENCOUNTER — Telehealth: Payer: Self-pay | Admitting: Internal Medicine

## 2020-01-13 DIAGNOSIS — Z8673 Personal history of transient ischemic attack (TIA), and cerebral infarction without residual deficits: Secondary | ICD-10-CM

## 2020-01-13 MED ORDER — CLOPIDOGREL BISULFATE 75 MG PO TABS: 75.0000 mg | ORAL_TABLET | Freq: Every day | ORAL | 6 refills | Status: DC

## 2020-01-13 NOTE — Telephone Encounter (Signed)
Pls contact pt [phone] regarding medicine

## 2020-01-13 NOTE — Telephone Encounter (Signed)
Pt calls and states she does not know whether she should be taking the clopidogrel or not, she states she has some but was unsure. Reviewed med list and hx, she was taking due to CVA x 3, the refills went to [facility] and were refused due to needing an appt by dr e. Newlin. Pt is scheduled w/ dr bloomfield mon 2/22 at 1415

## 2020-01-13 NOTE — Telephone Encounter (Signed)
I just saw Kendra Vega last week With her stroke history she should be on Plavix. I will send refill to [facility] outpatient pharmacy which is where she picked up her other medications at last visit. No need for return appointment.   Thanks!

## 2020-01-18 ENCOUNTER — Encounter: Payer: Medicare Other | Admitting: Internal Medicine

## 2020-01-19 MED FILL — ESOMEPRAZOLE MAG DR 20 MG C: 20 | 90 days supply | Qty: 90 | Fill #1

## 2020-01-20 ENCOUNTER — Other Ambulatory Visit: Payer: Self-pay

## 2020-01-20 ENCOUNTER — Ambulatory Visit (INDEPENDENT_AMBULATORY_CARE_PROVIDER_SITE_OTHER): Payer: Medicare Other | Admitting: Licensed Clinical Social Worker

## 2020-01-20 ENCOUNTER — Encounter: Payer: Self-pay | Admitting: Licensed Clinical Social Worker

## 2020-01-20 DIAGNOSIS — F319 Bipolar disorder, unspecified: Secondary | ICD-10-CM
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
## 2020-01-28 ENCOUNTER — Other Ambulatory Visit: Payer: Self-pay | Admitting: Internal Medicine

## 2020-01-28 DIAGNOSIS — R252 Cramp and spasm: Secondary | ICD-10-CM

## 2020-01-29 MED FILL — METHOCARBAMOL 500 MG TABS: 500 | 30 days supply | Qty: 60 | Fill #0

## 2020-02-01 MED FILL — CLOPIDOGREL 75 MG TABLET: 75 | 30 days supply | Qty: 30 | Fill #1

## 2020-02-03 ENCOUNTER — Encounter: Payer: Self-pay | Admitting: Licensed Clinical Social Worker

## 2020-02-03 ENCOUNTER — Other Ambulatory Visit: Payer: Self-pay

## 2020-02-03 ENCOUNTER — Ambulatory Visit (INDEPENDENT_AMBULATORY_CARE_PROVIDER_SITE_OTHER): Payer: Medicare Other | Admitting: Licensed Clinical Social Worker

## 2020-02-03 DIAGNOSIS — F319 Bipolar disorder, unspecified: Secondary | ICD-10-CM

## 2020-02-03 NOTE — BH Specialist Note (Signed)
Integrated Behavioral Health Visit via Telemedicine (Telephone)  02/03/2020 Kendra Vega [id number]   Session Start time: 12:45  Session End time: 1;07 Total time: 22 minutes  Referring Provider: Dr. Chesley Mires Type of Visit: Telephonic Patient location: Home [facility] Provider location: Office All persons participating in visit: Patient and [name], and [name]  Confirmed patient's address: Yes  Confirmed patient's phone number: Yes  Any changes to demographics: No   Discussed confidentiality: Yes    The following statements were read to the patient and/or legal guardian that are established with the [facility] Provider.  "The purpose of this phone visit is to provide behavioral health care while limiting exposure to the coronavirus (COVID19).  There is a possibility of technology failure and discussed alternative modes of communication if that failure occurs."  "By engaging in this telephone visit, you consent to the provision of healthcare.  Additionally, you authorize for your insurance to be billed for the services provided during this telephone visit."   Patient and/or legal guardian consented to telephone visit: Yes   PRESENTING CONCERNS: Patient and/or family reports the following symptoms/concerns: anxiety, grief, lack of natural supports, and sadness.  Duration of problem: since June 2020; Severity of problem: mild  GOALS ADDRESSED: Patient will: 1.  Reduce symptoms of: anxiety, depression, insomnia, mood instability, stress and grief.  2.  Increase knowledge and/or ability of: coping skills, healthy habits and stress reduction  3.  Demonstrate ability to: Increase healthy adjustment to current life circumstances, Increase adequate support systems for patient/family and Begin healthy grieving over loss  INTERVENTIONS: Interventions utilized:  Mindfulness or Relaxation Training, Brief CBT and Supportive Counseling Standardized Assessments completed: Not  Needed  ASSESSMENT: Patient currently experiencing ongoing grief. Patient reported that she is crying at times (10-15 minutes), and she reported that she feels better afterwards.  Patient has moved into her own space, anxiety/panic has decreased. Patient identified that she says "what if" statements to herself when she thinks about her son. Patient identified these statement cause her to feel bad about herself.    Goal: "To be live without my son."   Patient may benefit from counseling.  PLAN: 1. Follow up with behavioral health clinician on : two weeks.   Kendra Vega, [facility], LCAS

## 2020-02-15 MED FILL — LABETALOL HCL 100MG TABLET: 100 | 30 days supply | Qty: 60 | Fill #1

## 2020-02-17 ENCOUNTER — Other Ambulatory Visit: Payer: Self-pay

## 2020-02-17 ENCOUNTER — Ambulatory Visit (INDEPENDENT_AMBULATORY_CARE_PROVIDER_SITE_OTHER): Payer: Medicare Other | Admitting: Licensed Clinical Social Worker

## 2020-02-17 ENCOUNTER — Encounter: Payer: Self-pay | Admitting: Licensed Clinical Social Worker

## 2020-02-17 DIAGNOSIS — F319 Bipolar disorder, unspecified: Secondary | ICD-10-CM

## 2020-02-17 NOTE — BH Specialist Note (Signed)
Integrated Behavioral Health Visit via Telemedicine (Telephone)  02/17/2020 Kendra Vega [id number]   Session Start time: 12:35  Session End time: 1;00 Total time: 25 minutes  Referring Provider: Dr. Chesley Mires Type of Visit: Telephonic Patient location: Home [facility] Provider location: Patient All persons participating in visit: Patient and [facility]  Confirmed patient's address: Yes  Confirmed patient's phone number: Yes  Any changes to demographics: No   Discussed confidentiality: Yes    The following statements were read to the patient and/or legal guardian that are established with the [facility] Provider.  "The purpose of this phone visit is to provide behavioral health care while limiting exposure to the coronavirus (COVID19).  There is a possibility of technology failure and discussed alternative modes of communication if that failure occurs."  "By engaging in this telephone visit, you consent to the provision of healthcare.  Additionally, you authorize for your insurance to be billed for the services provided during this telephone visit."   Patient and/or legal guardian consented to telephone visit: Yes   PRESENTING CONCERNS: Patient and/or family reports the following symptoms/concerns: anxiety, grief, lack of natural supports, and sadness.  Duration of problem: since June 2020; Severity of problem: mild  GOALS ADDRESSED: Patient will: 1.  Reduce symptoms of: anxiety, depression, insomnia, stress and grief.  2.  Increase knowledge and/or ability of: coping skills, healthy habits and stress reduction  3.  Demonstrate ability to: Increase healthy adjustment to current life circumstances, Increase adequate support systems for patient/family and Begin healthy grieving over loss  INTERVENTIONS: Interventions utilized:  Mindfulness or Relaxation Training, Brief CBT and Supportive Counseling Standardized Assessments completed: Not Needed  ASSESSMENT: Patient currently  experiencing ongoing grief. Patient is not ready to go through her son's pictures. Patient processed her grief towards her son's passing. Patient identified what she does to give her peace when thinking of her son.  Patient is continuing to say "what if" when she thinks about her son. Patient is improving her ability to say positive things about herself as a mother.   Patient may benefit from counseling.  PLAN: 1. Follow up with behavioral health clinician on : three weeks.   Lysle Rubens

## 2020-03-02 MED FILL — METHOCARBAMOL 500 MG TABS: 500 | 15 days supply | Qty: 30 | Fill #1

## 2020-03-08 MED FILL — CLOPIDOGREL 75 MG TABLET: 75 | 30 days supply | Qty: 30 | Fill #2

## 2020-03-09 ENCOUNTER — Other Ambulatory Visit: Payer: Self-pay

## 2020-03-09 ENCOUNTER — Ambulatory Visit (INDEPENDENT_AMBULATORY_CARE_PROVIDER_SITE_OTHER): Payer: Medicare Other | Admitting: Internal Medicine

## 2020-03-09 ENCOUNTER — Encounter: Payer: Self-pay | Admitting: Internal Medicine

## 2020-03-09 ENCOUNTER — Other Ambulatory Visit (HOSPITAL_COMMUNITY): Payer: Self-pay | Admitting: Internal Medicine

## 2020-03-09 VITALS — BP 143/84 | HR 95 | Temp 99.0°F | Ht 65.0 in | Wt 248.6 lb

## 2020-03-09 DIAGNOSIS — I10 Essential (primary) hypertension: Secondary | ICD-10-CM

## 2020-03-09 DIAGNOSIS — N1832 Chronic kidney disease, stage 3b: Secondary | ICD-10-CM

## 2020-03-09 DIAGNOSIS — Z634 Disappearance and death of family member: Secondary | ICD-10-CM | POA: Diagnosis not present

## 2020-03-09 DIAGNOSIS — E119 Type 2 diabetes mellitus without complications: Secondary | ICD-10-CM

## 2020-03-09 DIAGNOSIS — Z8673 Personal history of transient ischemic attack (TIA), and cerebral infarction without residual deficits: Secondary | ICD-10-CM

## 2020-03-09 DIAGNOSIS — F4321 Adjustment disorder with depressed mood: Secondary | ICD-10-CM | POA: Diagnosis not present

## 2020-03-09 DIAGNOSIS — G2581 Restless legs syndrome: Secondary | ICD-10-CM

## 2020-03-09 DIAGNOSIS — I1A0 Resistant hypertension: Secondary | ICD-10-CM

## 2020-03-09 MED ORDER — CLOPIDOGREL BISULFATE 75 MG PO TABS: 75.0000 mg | ORAL_TABLET | Freq: Every day | ORAL | 6 refills | Status: DC

## 2020-03-09 MED ORDER — ATORVASTATIN CALCIUM 20 MG PO TABS: 20.0000 mg | ORAL_TABLET | Freq: Every day | ORAL | 6 refills | Status: DC

## 2020-03-09 MED ORDER — LABETALOL HCL 100 MG PO TABS: 100.0000 mg | ORAL_TABLET | Freq: Two times a day (BID) | ORAL | 5 refills | Status: DC

## 2020-03-09 MED ORDER — LISINOPRIL 10 MG PO TABS: 10.0000 mg | ORAL_TABLET | Freq: Every day | ORAL | 1 refills | Status: DC

## 2020-03-09 MED ORDER — ASPIRIN 325 MG PO TBEC: 325.0000 mg | DELAYED_RELEASE_TABLET | Freq: Every day | ORAL | 0 refills | Status: DC

## 2020-03-09 MED ORDER — AMLODIPINE BESYLATE 10 MG PO TABS: 10.0000 mg | ORAL_TABLET | Freq: Every day | ORAL | 0 refills | Status: DC

## 2020-03-09 MED ORDER — FUROSEMIDE 80 MG PO TABS: 80.0000 mg | ORAL_TABLET | Freq: Every day | ORAL | 1 refills | Status: DC

## 2020-03-09 MED FILL — LABETALOL HCL 100MG TABLET: 100 | 30 days supply | Qty: 60 | Fill #0

## 2020-03-09 MED FILL — ATORVASTATIN 20 MG TABLET: 20 | 30 days supply | Qty: 30 | Fill #0

## 2020-03-09 NOTE — Patient Instructions (Signed)
Kendra Vega, It was great seeing you!   Today we discussed your mental health since losing your son. I will send a note to [facility] to explain you need the list bigger in order to read which psychiatry offices you can go to.   Your restless legs at night, I will make sure you are not iron deficient and let you know next steps.   Continue taking your blood pressure medications as prescribed.   I would keep using the miralax to make sure you have regular bowel movements.   Take care! Dr. Chesley Mires

## 2020-03-10 LAB — CBC WITH DIFFERENTIAL/PLATELET
Basophils Absolute: 0.1 10*3/uL (ref 0.0–0.2)
Basos: 1 %
EOS (ABSOLUTE): 0.2 10*3/uL (ref 0.0–0.4)
Eos: 2 %
Hematocrit: 36 % (ref 34.0–46.6)
Hemoglobin: 11.6 g/dL (ref 11.1–15.9)
Immature Grans (Abs): 0 10*3/uL (ref 0.0–0.1)
Immature Granulocytes: 0 %
Lymphocytes Absolute: 3.2 10*3/uL — ABNORMAL HIGH (ref 0.7–3.1)
Lymphs: 25 %
MCH: 23.7 pg — ABNORMAL LOW (ref 26.6–33.0)
MCHC: 32.2 g/dL (ref 31.5–35.7)
MCV: 74 fL — ABNORMAL LOW (ref 79–97)
Monocytes Absolute: 0.5 10*3/uL (ref 0.1–0.9)
Monocytes: 4 %
Neutrophils Absolute: 8.5 10*3/uL — ABNORMAL HIGH (ref 1.4–7.0)
Neutrophils: 68 %
Platelets: 452 10*3/uL — ABNORMAL HIGH (ref 150–450)
RBC: 4.9 x10E6/uL (ref 3.77–5.28)
RDW: 17.5 % — ABNORMAL HIGH (ref 11.7–15.4)
WBC: 12.6 10*3/uL — ABNORMAL HIGH (ref 3.4–10.8)

## 2020-03-10 LAB — FERRITIN: Ferritin: 56 ng/mL (ref 15–150)

## 2020-03-11 MED FILL — FUROSEMIDE 80 MG TAB: 80 | 90 days supply | Qty: 90 | Fill #0

## 2020-03-11 MED FILL — LISINOPRIL 10 MG TABS: 10 | 90 days supply | Qty: 90 | Fill #0

## 2020-03-11 MED FILL — AMLODIPINE BESYLATE 10 MG T: 10 | 90 days supply | Qty: 90 | Fill #0

## 2020-03-15 ENCOUNTER — Encounter: Payer: Self-pay | Admitting: Internal Medicine

## 2020-03-15 ENCOUNTER — Ambulatory Visit: Payer: Medicare Other | Admitting: Licensed Clinical Social Worker

## 2020-03-15 ENCOUNTER — Telehealth: Payer: Self-pay | Admitting: Licensed Clinical Social Worker

## 2020-03-15 DIAGNOSIS — G2581 Restless legs syndrome: Secondary | ICD-10-CM | POA: Insufficient documentation

## 2020-03-15 NOTE — Assessment & Plan Note (Signed)
Patient is slightly above goal today. She is compliant with her medications, but understandably is quite anxious and tearful as she continues to grieve over the loss of her son. She is not having any symptoms concerning for uncontrolled HTN. Will not make any changes to regimen today.

## 2020-03-15 NOTE — Assessment & Plan Note (Signed)
Patient continues to struggle with daily grief. She has been receiving counseling from [place] which she finds helpful. A list of psychiatrists for her to see since she is not satisfied with [facility] was sent to her, but she was unfortunately not able to read it due to decreased vision from previous stroke. I have reached out to [facility] regarding this issue to hopefully get it addressed as soon as possible.  She confesses to me that the only thing that seems to help her is xanax that she purchases from a friend. She takes it "when her heart is about to explode," about once daily. Also endorses marijuana use to help her relax. I commended her for her honesty. Due to her complex psychiatric history, I would prefer to hold off on prescribing medications until she is evaluated by psychiatry which will hopefully happen within the next week or 2.  She is not at risk for harming herself or others and does not require emergent psychiatric hospitalization.

## 2020-03-15 NOTE — Telephone Encounter (Signed)
Patient was called twice for her scheduled visit. Patient did not answer either time, and a vm was left for the patient to call back to reschedule.

## 2020-03-15 NOTE — Assessment & Plan Note (Signed)
She is now being followed by [facility]. No recent changes to her medications and creatinine has been stable the last several months.

## 2020-03-15 NOTE — Assessment & Plan Note (Signed)
Patient complains of nightly leg pain described as "constantly having to move them" for the last few weeks.   - CBC and ferritin are normal - will discuss treatment options such as Requip at next visit if continues to be an issue

## 2020-03-15 NOTE — Progress Notes (Signed)
Established Patient Office Visit  Subjective:  Patient ID: Kendra Vega, female    DOB: 1971/07/03  Age: 49 y.o. MRN: [id number]  CC:  Chief Complaint  Patient presents with  . Follow-up  . Medication Refill    HPI Kendra Vega presents for chronic HTN, grief after loss of child.   Past Medical History:  Diagnosis Date  . Bipolar disorder (HCC)   . 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 Breast mass    rt breast ?abcess x's 1 week  . Chronic kidney disease   . Dental caries   . Depression   . GERD (gastroesophageal reflux disease)   . Hypertension   . Stroke (HCC)   . Type 2 diabetes mellitus without complication, without long-term current use of insulin (HCC) 10/21/2018    Past Surgical History:  Procedure Laterality Date  . BREAST CYST ASPIRATION Right 08/06/2017   breast abcess aspirated  . MULTIPLE TOOTH EXTRACTIONS    . TOOTH EXTRACTION N/A 12/11/2019   Procedure: EXTRACTION MOLARS;  Surgeon: Ocie Doyne, DDS;  Location: [facility] OR;  Service: Oral Surgery;  Laterality: N/A;    Family History  Problem Relation Age of Onset  . Diabetes Mother   . Hypertension Mother   . Hypertension Sister     Social History   Socioeconomic History  . Marital status: Single    Spouse name: Not on file  . Number of children: Not on file  . Years of education: Not on file  . Highest education level: Not on file  Occupational History  . Not on file  Tobacco Use  . Smoking status: Current Every Day Smoker    Packs/day: 0.20    Years: 7.00    Pack years: 1.40  . Smokeless tobacco: Never Used  . Tobacco comment: smokes 2-3 daily " Black and Milds " daily   Substance and Sexual Activity  . Alcohol use: Yes    Comment: rare glass of wine  . Drug use: Yes    Frequency: 1.0 times per week    Types: Marijuana    Comment: occasional marijuana   . Sexual activity: Not Currently    Partners: Male    Birth control/protection: None  Other Topics Concern  .  Not on file  Social History Narrative  . Not on file   Social Determinants of Health   Financial Resource Strain:   . Difficulty of Paying Living Expenses:   Food Insecurity:   . Worried About Programme researcher, broadcasting/film/video in the Last Year:   . Barista in the Last Year:   Transportation Needs:   . Freight forwarder (Medical):   Marland Kitchen Lack of Transportation (Non-Medical):   Physical Activity:   . Days of Exercise per Week:   . Minutes of Exercise per Session:   Stress:   . Feeling of Stress :   Social Connections:   . Frequency of Communication with Friends and Family:   . Frequency of Social Gatherings with Friends and Family:   . Attends Religious Services:   . Active Member of Clubs or Organizations:   . Attends Banker Meetings:   Marland Kitchen Marital Status:   Intimate Partner Violence:   . Fear of Current or Ex-Partner:   . Emotionally Abused:   Marland Kitchen Physically Abused:   . Sexually Abused:     Outpatient Medications Prior to Visit  Medication Sig Dispense Refill  . acetaminophen (TYLENOL) 500  MG tablet Take 1,000 mg by mouth every 6 (six) hours as needed for moderate pain.     Marland Kitchen amoxicillin (AMOXIL) 500 MG capsule Take 1 capsule (500 mg total) by mouth 3 (three) times daily. 21 capsule 0  . Blood Glucose Monitoring Suppl (TRUE METRIX METER) DEVI 1 each by Does not apply route daily. 1 Device 0  . CRANBERRY PO Take 1 tablet by mouth every other day.    . esomeprazole (NEXIUM) 20 MG capsule TAKE 1 CAPSULE (20 MG TOTAL) BY MOUTH DAILY. 90 capsule 1  . glucose blood (TRUE METRIX BLOOD GLUCOSE TEST) test strip Use once daily 100 each 12  . hydrocortisone cream 1 % Apply 1 application topically 2 (two) times daily as needed for itching.    . hydrOXYzine (ATARAX/VISTARIL) 25 MG tablet Take 1 tablet (25 mg total) by mouth 3 (three) times daily. 90 tablet 6  . lurasidone (LATUDA) 20 MG TABS tablet Take 1 tablet (20 mg total) by mouth at bedtime. 60 tablet 6  . Omega-3 Fatty  Acids (FISH OIL) 1000 MG CAPS Take 1,000 mg by mouth daily.    Marland Kitchen oxyCODONE-acetaminophen (PERCOCET) 5-325 MG tablet Take 1 tablet by mouth every 4 (four) hours as needed. 24 tablet 0  . Pumpkin Seed-Soy Germ (AZO BLADDER CONTROL/GO-LESS PO) Take 1 tablet by mouth 2 (two) times daily.    . TRUEPLUS LANCETS 28G MISC 1 each by Does not apply route daily. 100 each 12  . amLODipine (NORVASC) 10 MG tablet Take 1 tablet (10 mg total) by mouth daily. 90 tablet 0  . aspirin 325 MG EC tablet Take 1 tablet (325 mg total) by mouth daily. 30 tablet 0  . atorvastatin (LIPITOR) 20 MG tablet Take 1 tablet (20 mg total) by mouth daily at 6 PM. 30 tablet 6  . clopidogrel (PLAVIX) 75 MG tablet Take 1 tablet (75 mg total) by mouth daily. 30 tablet 6  . furosemide (LASIX) 80 MG tablet Take 1 tablet (80 mg total) by mouth daily. 90 tablet 1  . labetalol (NORMODYNE) 100 MG tablet Take 1 tablet (100 mg total) by mouth 2 (two) times daily. 60 tablet 5  . lisinopril (ZESTRIL) 10 MG tablet Take 1 tablet (10 mg total) by mouth daily. 90 tablet 1   No facility-administered medications prior to visit.    No Known Allergies  ROS Review of Systems  Constitutional: Positive for activity change. Negative for fatigue, fever and unexpected weight change.  HENT: Negative for dental problem, hearing loss and sinus pain.   Eyes: Negative for pain and visual disturbance.  Respiratory: Negative for cough and shortness of breath.   Cardiovascular: Negative for chest pain and leg swelling.  Gastrointestinal: Negative for constipation, diarrhea, nausea and vomiting.  Genitourinary: Negative for dysuria and hematuria.  Musculoskeletal: Negative for gait problem and joint swelling.  Skin: Negative for rash.  Neurological: Negative for dizziness, speech difficulty, weakness, light-headedness, numbness and headaches.  Psychiatric/Behavioral: Positive for dysphoric mood and sleep disturbance. The patient is nervous/anxious.        Objective:    Physical Exam  Constitutional: She is oriented to person, place, and time. She appears well-developed and well-nourished. No distress.  Eyes: Conjunctivae are normal.  Cardiovascular: Normal rate and regular rhythm.  Pulmonary/Chest: Effort normal and breath sounds normal.  Abdominal: Soft. She exhibits no distension. There is no abdominal tenderness.  Musculoskeletal:        General: No edema. Normal range of motion.  Cervical back: Normal range of motion and neck supple.  Neurological: She is alert and oriented to person, place, and time.  Skin: Skin is warm and dry.  Psychiatric: Her speech is normal and behavior is normal. Judgment and thought content normal. Cognition and memory are normal.  Tearful affect     BP (!) 143/84 (BP Location: Left Arm, Cuff Size: Large)   Pulse 95   Temp 99 F (37.2 C) (Oral)   Ht 5\' 5"  (1.651 m)   Wt 248 lb 9.6 oz (112.8 kg)   LMP 02/08/2020   SpO2 100% Comment: room air  BMI 41.37 kg/m  Wt Readings from Last 3 Encounters:  03/09/20 248 lb 9.6 oz (112.8 kg)  01/04/20 249 lb 14.4 oz (113.4 kg)  12/11/19 253 lb (114.8 kg)     Health Maintenance Due  Topic Date Due  . PNEUMOCOCCAL POLYSACCHARIDE VACCINE AGE 72-64 HIGH RISK  Never done  . OPHTHALMOLOGY EXAM  Never done  . COVID-19 Vaccine (1) Never done  . HEMOGLOBIN A1C  02/29/2020    There are no preventive care reminders to display for this patient.  Lab Results  Component Value Date   TSH 2.530 08/21/2018   Lab Results  Component Value Date   WBC 12.6 (H) 03/09/2020   HGB 11.6 03/09/2020   HCT 36.0 03/09/2020   MCV 74 (L) 03/09/2020   PLT 452 (H) 03/09/2020   Lab Results  Component Value Date   NA 138 08/31/2019   K 4.1 08/31/2019   CO2 19 (L) 08/31/2019   GLUCOSE 83 08/31/2019   BUN 16 08/31/2019   CREATININE 1.79 (H) 08/31/2019   BILITOT <0.2 02/04/2018   ALKPHOS 119 (H) 02/04/2018   AST 36 02/04/2018   ALT 50 (H) 02/04/2018   PROT 6.9 02/04/2018    ALBUMIN 3.9 02/04/2018   CALCIUM 9.5 08/31/2019   ANIONGAP 6 05/18/2019   Lab Results  Component Value Date   CHOL 113 08/04/2017   Lab Results  Component Value Date   HDL 28 (L) 08/04/2017   Lab Results  Component Value Date   LDLCALC 50 08/04/2017   Lab Results  Component Value Date   TRIG 176 (H) 08/04/2017   Lab Results  Component Value Date   CHOLHDL 4.0 08/04/2017   Lab Results  Component Value Date   HGBA1C 5.7 (A) 08/31/2019      Assessment & Plan:   Problem List Items Addressed This Visit      Cardiovascular and Mediastinum   Resistant hypertension    Patient is slightly above goal today. She is compliant with her medications, but understandably is quite anxious and tearful as she continues to grieve over the loss of her son. She is not having any symptoms concerning for uncontrolled HTN. Will not make any changes to regimen today.       Relevant Medications   amLODipine (NORVASC) 10 MG tablet   aspirin 325 MG EC tablet   atorvastatin (LIPITOR) 20 MG tablet   furosemide (LASIX) 80 MG tablet   labetalol (NORMODYNE) 100 MG tablet   lisinopril (ZESTRIL) 10 MG tablet     Endocrine   Type 2 diabetes mellitus without complication, without long-term current use of insulin (HCC)   Relevant Medications   aspirin 325 MG EC tablet   atorvastatin (LIPITOR) 20 MG tablet   lisinopril (ZESTRIL) 10 MG tablet   Other Relevant Orders   Ferritin (Completed)     Genitourinary   CKD (chronic kidney disease),  stage III    She is now being followed by [facility]. No recent changes to her medications and creatinine has been stable the last several months.         Other   History of CVA (cerebrovascular accident)   Relevant Medications   atorvastatin (LIPITOR) 20 MG tablet   clopidogrel (PLAVIX) 75 MG tablet   Grief at loss of child    Patient continues to struggle with daily grief. She has been receiving counseling from [facility] which she finds helpful. A  list of psychiatrists for her to see since she is not satisfied with [facility] was sent to her, but she was unfortunately not able to read it due to decreased vision from previous stroke. I have reached out to [place] regarding this issue to hopefully get it addressed as soon as possible.  She confesses to me that the only thing that seems to help her is xanax that she purchases from a friend. She takes it "when her heart is about to explode," about once daily. Also endorses marijuana use to help her relax. I commended her for her honesty. Due to her complex psychiatric history, I would prefer to hold off on prescribing medications until she is evaluated by psychiatry which will hopefully happen within the next week or 2.  She is not at risk for harming herself or others and does not require emergent psychiatric hospitalization.       Restless leg - Primary    Patient complains of nightly leg pain described as "constantly having to move them" for the last few weeks.   - CBC and ferritin are normal - will discuss treatment options such as Requip at next visit if continues to be an issue       Relevant Orders   CBC with Diff (Completed)      Meds ordered this encounter  Medications  . amLODipine (NORVASC) 10 MG tablet    Sig: Take 1 tablet (10 mg total) by mouth daily.    Dispense:  90 tablet    Refill:  0  . aspirin 325 MG EC tablet    Sig: Take 1 tablet (325 mg total) by mouth daily.    Dispense:  30 tablet    Refill:  0  . atorvastatin (LIPITOR) 20 MG tablet    Sig: Take 1 tablet (20 mg total) by mouth daily at 6 PM.    Dispense:  30 tablet    Refill:  6  . clopidogrel (PLAVIX) 75 MG tablet    Sig: Take 1 tablet (75 mg total) by mouth daily.    Dispense:  30 tablet    Refill:  6  . furosemide (LASIX) 80 MG tablet    Sig: Take 1 tablet (80 mg total) by mouth daily.    Dispense:  90 tablet    Refill:  1  . labetalol (NORMODYNE) 100 MG tablet    Sig: Take 1 tablet (100 mg total) by  mouth 2 (two) times daily.    Dispense:  60 tablet    Refill:  5  . lisinopril (ZESTRIL) 10 MG tablet    Sig: Take 1 tablet (10 mg total) by mouth daily.    Dispense:  90 tablet    Refill:  1    Follow-up: Return in about 4 weeks (around 04/06/2020) for Grief, restless legs .    Bridget Hartshorn, DO

## 2020-03-15 NOTE — Assessment & Plan Note (Signed)
>>  ASSESSMENT AND PLAN FOR GRIEF AT LOSS OF CHILD WRITTEN ON 03/15/2020 11:52 AM BY BLOOMFIELD, CARLEY D, DO  Patient continues to struggle with daily grief. She has been receiving counseling from [place] which she finds helpful. A list of psychiatrists for her to see since she is not satisfied with [name] was sent to her, but she was unfortunately not able to read it due to decreased vision from previous stroke. I have reached out to [place] regarding this issue to hopefully get it addressed as soon as possible.  She confesses to me that the only thing that seems to help her is xanax that she purchases from a friend. She takes it "when her heart is about to explode," about once daily. Also endorses marijuana use to help her relax. I commended her for her honesty. Due to her complex psychiatric history, I would prefer to hold off on prescribing medications until she is evaluated by psychiatry which will hopefully happen within the next week or 2.  She is not at risk for harming herself or others and does not require emergent psychiatric hospitalization.

## 2020-03-16 ENCOUNTER — Other Ambulatory Visit: Payer: Self-pay | Admitting: Internal Medicine

## 2020-03-16 DIAGNOSIS — R252 Cramp and spasm: Secondary | ICD-10-CM

## 2020-03-17 NOTE — Progress Notes (Signed)
Internal Medicine Clinic Attending  Case discussed with Dr. Chesley Mires at the time of the visit.  We reviewed the resident's history and exam and pertinent patient test results.  I agree with the assessment, diagnosis, and plan of care documented in the resident's note.    Given RLS, will need to treat with oral iron to achieve ferritin >75.

## 2020-03-18 ENCOUNTER — Telehealth: Payer: Self-pay

## 2020-03-18 DIAGNOSIS — R252 Cramp and spasm: Secondary | ICD-10-CM

## 2020-03-18 MED ORDER — METHOCARBAMOL 500 MG PO TABS: 1000.0000 mg | ORAL_TABLET | Freq: Three times a day (TID) | ORAL | 2 refills | Status: DC | PRN

## 2020-03-18 MED ORDER — FERROUS SULFATE 325 (65 FE) MG PO TBEC: 325.0000 mg | DELAYED_RELEASE_TABLET | Freq: Every day | ORAL | 3 refills | Status: DC

## 2020-03-18 MED FILL — METHOCARBAMOL 500 MG TABS: 500 | 5 days supply | Qty: 30 | Fill #0

## 2020-03-18 NOTE — Telephone Encounter (Signed)
Requesting lab results, please call back.  

## 2020-03-18 NOTE — Telephone Encounter (Signed)
Returned patient's call. Discussed results showing iron deficiency and plan to replete orally.

## 2020-03-28 ENCOUNTER — Other Ambulatory Visit: Payer: Self-pay | Admitting: Internal Medicine

## 2020-03-28 MED FILL — ESOMEPRAZOLE MAG DR 20 MG C: 20 | 90 days supply | Qty: 90 | Fill #0

## 2020-03-29 MED FILL — METHOCARBAMOL 500 MG TABS: 500 | 5 days supply | Qty: 30 | Fill #1

## 2020-04-01 ENCOUNTER — Ambulatory Visit: Payer: Medicare Other | Admitting: Podiatry

## 2020-04-06 ENCOUNTER — Encounter: Payer: Medicare Other | Admitting: Internal Medicine

## 2020-04-11 MED FILL — CLOPIDOGREL 75 MG TABLET: 75 | 30 days supply | Qty: 30 | Fill #3

## 2020-04-18 MED FILL — METHOCARBAMOL 500 MG TABS: 500 | 5 days supply | Qty: 30 | Fill #2

## 2020-04-18 MED FILL — ATORVASTATIN 20 MG TABLET: 20 | 30 days supply | Qty: 30 | Fill #1

## 2020-05-03 ENCOUNTER — Other Ambulatory Visit: Payer: Self-pay | Admitting: Internal Medicine

## 2020-05-03 DIAGNOSIS — R252 Cramp and spasm: Secondary | ICD-10-CM

## 2020-05-05 NOTE — Telephone Encounter (Signed)
Need refill onmethocarbamol (ROBAXIN) 500 MG tablet  ;pt contact [phone]  [facility] - [place], [place] - [address].

## 2020-05-09 MED FILL — METHOCARBAMOL 500 MG TABS: 500 | 5 days supply | Qty: 30 | Fill #0

## 2020-05-16 DIAGNOSIS — F319 Bipolar disorder, unspecified: Secondary | ICD-10-CM | POA: Diagnosis not present

## 2020-05-23 MED FILL — ATORVASTATIN 20 MG TABLET: 20 | 30 days supply | Qty: 30 | Fill #2

## 2020-05-23 MED FILL — METHOCARBAMOL 500 MG TABS: 500 | 5 days supply | Qty: 30 | Fill #1

## 2020-05-25 ENCOUNTER — Ambulatory Visit (INDEPENDENT_AMBULATORY_CARE_PROVIDER_SITE_OTHER): Payer: Medicare HMO | Admitting: Licensed Clinical Social Worker

## 2020-05-25 ENCOUNTER — Other Ambulatory Visit: Payer: Self-pay

## 2020-05-25 DIAGNOSIS — F319 Bipolar disorder, unspecified: Secondary | ICD-10-CM

## 2020-05-25 NOTE — BH Specialist Note (Signed)
Integrated Behavioral Health Visit via Telemedicine (Telephone)  05/25/2020 Kendra Vega [id number]   Session Start time: 10:35  Session End time: 11;00 Total time: 25 minutes  Referring Provider: Dr. Chesley Mires Type of Visit: Telephonic Patient location: Home [facility] Provider location: Office All persons participating in visit: Patient and [facility]  Confirmed patient's address: Yes  Confirmed patient's phone number: Yes  Any changes to demographics: No  Discussed confidentiality: Yes    The following statements were read to the patient and/or legal guardian that are established with the [facility] Provider.  "The purpose of this phone visit is to provide behavioral health care while limiting exposure to the coronavirus (COVID19).  There is a possibility of technology failure and discussed alternative modes of communication if that failure occurs."  "By engaging in this telephone visit, you consent to the provision of healthcare.  Additionally, you authorize for your insurance to be billed for the services provided during this telephone visit."   Patient and/or legal guardian consented to telephone visit: Yes   PRESENTING CONCERNS: Patient and/or family reports the following symptoms/concerns: anxiety, grief, lack of natural supports, and sadness. Duration of problem: since June 2020; Severity of problem: moderate  GOALS ADDRESSED: Patient will: 1.  Reduce symptoms of: anxiety, depression, mood instability and stress  2.  Increase knowledge and/or ability of: coping skills, healthy habits and self-management skills  3.  Demonstrate ability to: Increase healthy adjustment to current life circumstances, Increase adequate support systems for patient/family and Improve medication compliance  INTERVENTIONS: Interventions utilized:  Motivational Interviewing, Brief CBT, Supportive Counseling and Link to [facility] Standardized Assessments completed: assessed for SI,  HI, and self-harm.  ASSESSMENT: Patient currently experiencing moderate levels of depression and grief. Patient reported that she is continuing to panic and get overwhelmed. Patient wants to find a part time job, but she reported her anxiety/grief needs to reduce prior to starting a job. Patient requested that I mail the letter of psychiatrist to her again in order to review what other options she has.   Patient is currently taking Clonazepam 5mg , Hydroxyzine 25mg  taking 3 x per day, Fluoxetine 40mg , and Busperione 10mg . Patient is concerned that her medication is not helping her, and she reported that she is tired often. Patient reported that Xanax has worked for her in the past, but her current prescribe will not prescribe her this medication. Patient was encouraged to find healthy coping skills to reduce her panic.   Patient may benefit from counseling and meeting with a psychiatrist.  PLAN: 1. Follow up with behavioral health clinician on : two weeks.  2. Referral(s): Psychiatrist, List was mailed (2nd time) to the patient.   , [facility], LCAS

## 2020-05-31 ENCOUNTER — Encounter: Payer: Self-pay | Admitting: Licensed Clinical Social Worker

## 2020-06-03 MED FILL — CLOPIDOGREL 75 MG TABLET: 75 | 30 days supply | Qty: 30 | Fill #5

## 2020-06-03 MED FILL — METHOCARBAMOL 500 MG TABS: 500 | 5 days supply | Qty: 30 | Fill #2

## 2020-06-07 MED FILL — LABETALOL HCL 100 MG TABLET: 100 | 30 days supply | Qty: 60 | Fill #2

## 2020-06-13 MED FILL — ESOMEPRAZOLE MAG DR 20 MG C: 20 | 90 days supply | Qty: 90 | Fill #1

## 2020-06-14 ENCOUNTER — Telehealth: Payer: Self-pay | Admitting: Licensed Clinical Social Worker

## 2020-06-14 ENCOUNTER — Ambulatory Visit: Payer: Medicare HMO | Admitting: Licensed Clinical Social Worker

## 2020-06-14 NOTE — Telephone Encounter (Signed)
Patient was called for her scheduled visit. Patient did not answer, and a vm was left for the patient to call our office back to reschedule.  

## 2020-06-23 ENCOUNTER — Encounter: Payer: Self-pay | Admitting: Internal Medicine

## 2020-06-23 ENCOUNTER — Ambulatory Visit (INDEPENDENT_AMBULATORY_CARE_PROVIDER_SITE_OTHER): Payer: Medicare HMO | Admitting: Internal Medicine

## 2020-06-23 ENCOUNTER — Other Ambulatory Visit: Payer: Self-pay

## 2020-06-23 ENCOUNTER — Other Ambulatory Visit (HOSPITAL_COMMUNITY): Payer: Self-pay | Admitting: Internal Medicine

## 2020-06-23 VITALS — BP 161/91 | HR 79 | Temp 99.0°F | Wt 250.3 lb

## 2020-06-23 DIAGNOSIS — I10 Essential (primary) hypertension: Secondary | ICD-10-CM | POA: Diagnosis not present

## 2020-06-23 DIAGNOSIS — Z8673 Personal history of transient ischemic attack (TIA), and cerebral infarction without residual deficits: Secondary | ICD-10-CM | POA: Diagnosis not present

## 2020-06-23 DIAGNOSIS — F4321 Adjustment disorder with depressed mood: Secondary | ICD-10-CM | POA: Diagnosis not present

## 2020-06-23 DIAGNOSIS — R252 Cramp and spasm: Secondary | ICD-10-CM

## 2020-06-23 DIAGNOSIS — E119 Type 2 diabetes mellitus without complications: Secondary | ICD-10-CM

## 2020-06-23 DIAGNOSIS — Z634 Disappearance and death of family member: Secondary | ICD-10-CM | POA: Diagnosis not present

## 2020-06-23 LAB — POCT GLYCOSYLATED HEMOGLOBIN (HGB A1C): Hemoglobin A1C: 6 % — AB (ref 4.0–5.6)

## 2020-06-23 LAB — GLUCOSE, CAPILLARY: Glucose-Capillary: 106 mg/dL — ABNORMAL HIGH (ref 70–99)

## 2020-06-23 MED ORDER — LISINOPRIL 10 MG PO TABS: 10.0000 mg | ORAL_TABLET | Freq: Every day | ORAL | 1 refills | Status: DC

## 2020-06-23 MED ORDER — CLOPIDOGREL BISULFATE 75 MG PO TABS: 75.0000 mg | ORAL_TABLET | Freq: Every day | ORAL | 6 refills | Status: DC

## 2020-06-23 MED ORDER — ESCITALOPRAM OXALATE 10 MG PO TABS
10.0000 mg | ORAL_TABLET | Freq: Every day | ORAL | 2 refills | Status: DC
Start: 2020-06-23 — End: 2020-06-23

## 2020-06-23 MED ORDER — LABETALOL HCL 200 MG PO TABS: 200.0000 mg | ORAL_TABLET | Freq: Two times a day (BID) | ORAL | 2 refills | Status: DC

## 2020-06-23 MED ORDER — FERROUS SULFATE 325 (65 FE) MG PO TBEC: 325.0000 mg | DELAYED_RELEASE_TABLET | Freq: Every day | ORAL | 3 refills | Status: DC

## 2020-06-23 MED ORDER — ASPIRIN 325 MG PO TBEC: 325.0000 mg | DELAYED_RELEASE_TABLET | Freq: Every day | ORAL | 0 refills | Status: DC

## 2020-06-23 MED ORDER — AMLODIPINE BESYLATE 10 MG PO TABS: 10.0000 mg | ORAL_TABLET | Freq: Every day | ORAL | 0 refills | Status: DC

## 2020-06-23 MED ORDER — METHOCARBAMOL 500 MG PO TABS: 1000.0000 mg | ORAL_TABLET | Freq: Three times a day (TID) | ORAL | 2 refills | Status: DC | PRN

## 2020-06-23 MED ORDER — FUROSEMIDE 80 MG PO TABS: 80.0000 mg | ORAL_TABLET | Freq: Every day | ORAL | 1 refills | Status: DC

## 2020-06-23 MED FILL — FUROSEMIDE 80 MG TAB: 80 | 90 days supply | Qty: 90 | Fill #0

## 2020-06-23 MED FILL — LABETALOL HCL 200 MG TABS: 200 | 30 days supply | Qty: 60 | Fill #0

## 2020-06-23 MED FILL — ESCITALOPRAM 10 MG TABLET: 10 | 30 days supply | Qty: 30 | Fill #0

## 2020-06-23 MED FILL — AMLODIPINE BESYLATE 10 MG T: 10 | 90 days supply | Qty: 90 | Fill #0

## 2020-06-23 MED FILL — LISINOPRIL 10 MG TABS: 10 | 90 days supply | Qty: 90 | Fill #0

## 2020-06-23 MED FILL — METHOCARBAMOL 500 MG TABS: 500 | 5 days supply | Qty: 30 | Fill #0

## 2020-06-23 NOTE — Patient Instructions (Signed)
Ms. Fake, It was nice seeing you again!  Today we discussed:   Blood pressure - I have increased the Labetalol which should help get you closer to goal.   Depression - I have started a low dose of medication to help with overall mood. I will follow-up with Phineas Semen regarding the referral.   Referrals - GYN and eye doctor   Screenings - mammogram   Take care, Dr. Chesley Mires

## 2020-06-23 NOTE — Progress Notes (Signed)
60

## 2020-06-24 ENCOUNTER — Encounter: Payer: Self-pay | Admitting: Internal Medicine

## 2020-06-24 ENCOUNTER — Encounter: Payer: Medicare HMO | Admitting: Internal Medicine

## 2020-06-24 LAB — BMP8+ANION GAP
Anion Gap: 17 mmol/L (ref 10.0–18.0)
BUN/Creatinine Ratio: 13 (ref 9–23)
BUN: 18 mg/dL (ref 6–24)
CO2: 20 mmol/L (ref 20–29)
Calcium: 9.8 mg/dL (ref 8.7–10.2)
Chloride: 100 mmol/L (ref 96–106)
Creatinine, Ser: 1.43 mg/dL — ABNORMAL HIGH (ref 0.57–1.00)
GFR calc Af Amer: 49 mL/min/{1.73_m2} — ABNORMAL LOW (ref >59)
GFR calc non Af Amer: 43 mL/min/{1.73_m2} — ABNORMAL LOW (ref >59)
Glucose: 90 mg/dL (ref 65–99)
Potassium: 4.4 mmol/L (ref 3.5–5.2)
Sodium: 137 mmol/L (ref 134–144)

## 2020-06-24 NOTE — Assessment & Plan Note (Signed)
Remains well controlled off of medications. Will continue to monitor annually.

## 2020-06-24 NOTE — Assessment & Plan Note (Signed)
>>  ASSESSMENT AND PLAN FOR GRIEF AT LOSS OF CHILD WRITTEN ON 06/24/2020  3:57 PM BY BLOOMFIELD, CARLEY D, DO  She is now right at the 12 month mark since the loss of her child and still having significant symptoms affecting her life which would now classify as MDD. She endorses a fairly complex psychiatric history and was previously followed by [facility]. States she was diagnosed as bipolar type I. She is currently working with Miquel Dunn to establish care with a different psychiatrist. Will hold off on initiating any therapy and defer to psychiatry.

## 2020-06-24 NOTE — Assessment & Plan Note (Signed)
Blood pressure not at goal on current regimen of Lasix, Lisinopril, Labetalol and amlodipine. She endorses medication compliance. Due to recent struggles with depression, she admits to cooking less and eating more frozen dinners. We discussed that these have a lot more sodium than she would normally eat when cooking her own meals.  Given her history of multiple strokes, will need to increase regimen to get her closer to goal. Will increase Labetalol to 200 mg BID.

## 2020-06-24 NOTE — Progress Notes (Signed)
Established Patient Office Visit  Subjective:  Patient ID: Kendra Vega, female    DOB: [birth year]  Age: 49 y.o. MRN: [id number]  CC:  Chief Complaint  Patient presents with   routine follow up    HPI Kendra Vega presents for follow-up on chronic HTN, MDD complicated by grief over loss of a child. This month has been especially hard for her since it is the first anniversary of his passing. She is otherwise feels like she is doing pretty well from a health standpoint.   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   Breast mass    rt breast ?abcess x's 1 week   Chronic kidney disease    Dental caries    Depression    GERD (gastroesophageal reflux disease)    Hypertension    Stroke (HCC)    Type 2 diabetes mellitus without complication, without long-term current use of insulin (HCC) 10/21/2018    Past Surgical History:  Procedure Laterality Date   BREAST CYST ASPIRATION Right 08/06/2017   breast abcess aspirated   MULTIPLE TOOTH EXTRACTIONS     TOOTH EXTRACTION N/A 12/11/2019   Procedure: EXTRACTION MOLARS;  Surgeon: Ocie DoyneJensen, Scott, DDS;  Location: [facility] OR;  Service: Oral Surgery;  Laterality: N/A;    Family History  Problem Relation Age of Onset   Diabetes Mother    Hypertension Mother    Hypertension Sister     Social History   Socioeconomic History   Marital status: Single    Spouse name: Not on file   Number of children: Not on file   Years of education: Not on file   Highest education level: Not on file  Occupational History   Not on file  Tobacco Use   Smoking status: Current Every Day Smoker    Packs/day: 0.20    Years: 7.00    Pack years: 1.40   Smokeless tobacco: Never Used   Tobacco comment: smokes 2-3 daily " Black and Milds " daily   Vaping Use   Vaping Use: Never used  Substance and Sexual Activity   Alcohol use: Yes    Comment: rare glass  of wine   Drug use: Yes    Frequency: 1.0 times per week    Types: Marijuana    Comment: occasional marijuana    Sexual activity: Not Currently    Partners: Male    Birth control/protection: None  Other Topics Concern   Not on file  Social History Narrative   Not on file   Social Determinants of Health   Financial Resource Strain:    Difficulty of Paying Living Expenses:   Food Insecurity:    Worried About Programme researcher, broadcasting/film/videounning Out of Food in the Last Year:    Baristaan Out of Food in the Last Year:   Transportation Needs:    Freight forwarderLack of Transportation (Medical):    Lack of Transportation (Non-Medical):   Physical Activity:    Days of Exercise per Week:    Minutes of Exercise per Session:   Stress:    Feeling of Stress :   Social Connections:    Frequency of Communication with Friends and Family:    Frequency of Social Gatherings with Friends and Family:    Attends Religious Services:    Active Member of Clubs or Organizations:    Attends BankerClub or Organization Meetings:    Marital Status:   Intimate  Partner Violence:    Fear of Current or Ex-Partner:    Emotionally Abused:    Physically Abused:    Sexually Abused:     Outpatient Medications Prior to Visit  Medication Sig Dispense Refill   acetaminophen (TYLENOL) 500 MG tablet Take 1,000 mg by mouth every 6 (six) hours as needed for moderate pain.      atorvastatin (LIPITOR) 20 MG tablet Take 1 tablet (20 mg total) by mouth daily at 6 PM. 30 tablet 6   Blood Glucose Monitoring Suppl (TRUE METRIX METER) DEVI 1 each by Does not apply route daily. 1 Device 0   CRANBERRY PO Take 1 tablet by mouth every other day.     esomeprazole (NEXIUM) 20 MG capsule TAKE 1 CAPSULE (20 MG TOTAL) BY MOUTH DAILY. 90 capsule 1   glucose blood (TRUE METRIX BLOOD GLUCOSE TEST) test strip Use once daily 100 each 12   hydrocortisone cream 1 % Apply 1 application topically 2 (two) times daily as needed for itching.     hydrOXYzine  (ATARAX/VISTARIL) 25 MG tablet Take 1 tablet (25 mg total) by mouth 3 (three) times daily. 90 tablet 6   lurasidone (LATUDA) 20 MG TABS tablet Take 1 tablet (20 mg total) by mouth at bedtime. 60 tablet 6   Omega-3 Fatty Acids (FISH OIL) 1000 MG CAPS Take 1,000 mg by mouth daily.     Pumpkin Seed-Soy Germ (AZO BLADDER CONTROL/GO-LESS PO) Take 1 tablet by mouth 2 (two) times daily.     TRUEPLUS LANCETS 28G MISC 1 each by Does not apply route daily. 100 each 12   amLODipine (NORVASC) 10 MG tablet Take 1 tablet (10 mg total) by mouth daily. 90 tablet 0   amoxicillin (AMOXIL) 500 MG capsule Take 1 capsule (500 mg total) by mouth 3 (three) times daily. 21 capsule 0   aspirin 325 MG EC tablet Take 1 tablet (325 mg total) by mouth daily. 30 tablet 0   clopidogrel (PLAVIX) 75 MG tablet Take 1 tablet (75 mg total) by mouth daily. 30 tablet 6   ferrous sulfate 325 (65 FE) MG EC tablet Take 1 tablet (325 mg total) by mouth daily. 30 tablet 3   furosemide (LASIX) 80 MG tablet Take 1 tablet (80 mg total) by mouth daily. 90 tablet 1   labetalol (NORMODYNE) 100 MG tablet Take 1 tablet (100 mg total) by mouth 2 (two) times daily. 60 tablet 5   lisinopril (ZESTRIL) 10 MG tablet Take 1 tablet (10 mg total) by mouth daily. 90 tablet 1   methocarbamol (ROBAXIN) 500 MG tablet TAKE 2 TABLETS (1,000 MG TOTAL) BY MOUTH EVERY 8 (EIGHT) HOURS AS NEEDED FOR MUSCLE SPASMS. 30 tablet 2   oxyCODONE-acetaminophen (PERCOCET) 5-325 MG tablet Take 1 tablet by mouth every 4 (four) hours as needed. 24 tablet 0   No facility-administered medications prior to visit.    No Known Allergies  ROS Review of Systems  Constitutional: Negative for appetite change, chills, fatigue and fever.  Eyes: Negative for visual disturbance.  Respiratory: Negative for cough and shortness of breath.   Cardiovascular: Negative for chest pain, palpitations and leg swelling.  Gastrointestinal: Negative for constipation.  Endocrine:  Negative for polydipsia, polyphagia and polyuria.  Genitourinary: Negative for difficulty urinating and hematuria.  Musculoskeletal: Negative for gait problem, joint swelling and myalgias.  Neurological: Negative for syncope, weakness, light-headedness, numbness and headaches.  Psychiatric/Behavioral: Positive for dysphoric mood and sleep disturbance. Negative for self-injury and suicidal ideas. The patient is nervous/anxious.  Objective:    Physical Exam  BP (!) 161/91 (BP Location: Left Arm, Patient Position: Sitting, Cuff Size: Normal)    Pulse 79    Temp 99 F (37.2 C) (Oral)    Wt (!) 250 lb 4.8 oz (113.5 kg)    SpO2 100%    BMI 41.65 kg/m  Wt Readings from Last 3 Encounters:  06/23/20 (!) 250 lb 4.8 oz (113.5 kg)  03/09/20 248 lb 9.6 oz (112.8 kg)  01/04/20 249 lb 14.4 oz (113.4 kg)     Health Maintenance Due  Topic Date Due   Hepatitis C Screening  Never done   PNEUMOCOCCAL POLYSACCHARIDE VACCINE AGE 45-64 HIGH RISK  Never done   OPHTHALMOLOGY EXAM  Never done   COVID-19 Vaccine (1) Never done   COLONOSCOPY  Never done   PAP SMEAR-Modifier  08/06/2020    There are no preventive care reminders to display for this patient.  Lab Results  Component Value Date   TSH 2.530 08/21/2018   Lab Results  Component Value Date   WBC 12.6 (H) 03/09/2020   HGB 11.6 03/09/2020   HCT 36.0 03/09/2020   MCV 74 (L) 03/09/2020   PLT 452 (H) 03/09/2020   Lab Results  Component Value Date   NA 137 06/23/2020   K 4.4 06/23/2020   CO2 20 06/23/2020   GLUCOSE 90 06/23/2020   BUN 18 06/23/2020   CREATININE 1.43 (H) 06/23/2020   BILITOT <0.2 02/04/2018   ALKPHOS 119 (H) 02/04/2018   AST 36 02/04/2018   ALT 50 (H) 02/04/2018   PROT 6.9 02/04/2018   ALBUMIN 3.9 02/04/2018   CALCIUM 9.8 06/23/2020   ANIONGAP 6 05/18/2019   Lab Results  Component Value Date   CHOL 113 08/04/2017   Lab Results  Component Value Date   HDL 28 (L) 08/04/2017   Lab Results   Component Value Date   LDLCALC 50 08/04/2017   Lab Results  Component Value Date   TRIG 176 (H) 08/04/2017   Lab Results  Component Value Date   CHOLHDL 4.0 08/04/2017   Lab Results  Component Value Date   HGBA1C 6.0 (A) 06/23/2020      Assessment & Plan:   Problem List Items Addressed This Visit      Cardiovascular and Mediastinum   Resistant hypertension    Blood pressure not at goal on current regimen of Lasix, Lisinopril, Labetalol and amlodipine. She endorses medication compliance. Due to recent struggles with depression, she admits to cooking less and eating more frozen dinners. We discussed that these have a lot more sodium than she would normally eat when cooking her own meals.  Given her history of multiple strokes, will need to increase regimen to get her closer to goal. Will increase Labetalol to 200 mg BID.       Relevant Medications   lisinopril (ZESTRIL) 10 MG tablet   labetalol (NORMODYNE) 200 MG tablet   furosemide (LASIX) 80 MG tablet   amLODipine (NORVASC) 10 MG tablet   aspirin 325 MG EC tablet   Other Relevant Orders   BMP8+Anion Gap (Completed)     Endocrine   Type 2 diabetes mellitus without complication, without long-term current use of insulin (HCC) - Primary    Remains well controlled off of medications. Will continue to monitor annually.       Relevant Medications   lisinopril (ZESTRIL) 10 MG tablet   aspirin 325 MG EC tablet   Other Relevant Orders   POC Hbg A1C (  Completed)     Other   History of CVA (cerebrovascular accident)   Relevant Medications   clopidogrel (PLAVIX) 75 MG tablet   Muscle cramps   Relevant Medications   methocarbamol (ROBAXIN) 500 MG tablet   Grief at loss of child    She is now right at the 12 month mark since the loss of her child and still having significant symptoms affecting her life which would now classify as MDD. She endorses a fairly complex psychiatric history and was previously followed by [facility].  States she was diagnosed as bipolar type I. She is currently working with [facility] to establish care with a different psychiatrist. Will hold off on initiating any therapy and defer to psychiatry.          Meds ordered this encounter  Medications   lisinopril (ZESTRIL) 10 MG tablet    Sig: Take 1 tablet (10 mg total) by mouth daily.    Dispense:  90 tablet    Refill:  1   labetalol (NORMODYNE) 200 MG tablet    Sig: Take 1 tablet (200 mg total) by mouth 2 (two) times daily.    Dispense:  60 tablet    Refill:  2   furosemide (LASIX) 80 MG tablet    Sig: Take 1 tablet (80 mg total) by mouth daily.    Dispense:  90 tablet    Refill:  1   amLODipine (NORVASC) 10 MG tablet    Sig: Take 1 tablet (10 mg total) by mouth daily.    Dispense:  90 tablet    Refill:  0   methocarbamol (ROBAXIN) 500 MG tablet    Sig: Take 2 tablets (1,000 mg total) by mouth every 8 (eight) hours as needed for muscle spasms.    Dispense:  30 tablet    Refill:  2   aspirin 325 MG EC tablet    Sig: Take 1 tablet (325 mg total) by mouth daily.    Dispense:  30 tablet    Refill:  0   clopidogrel (PLAVIX) 75 MG tablet    Sig: Take 1 tablet (75 mg total) by mouth daily.    Dispense:  30 tablet    Refill:  6   ferrous sulfate 325 (65 FE) MG EC tablet    Sig: Take 1 tablet (325 mg total) by mouth daily.    Dispense:  30 tablet    Refill:  3   DISCONTD: escitalopram (LEXAPRO) 10 MG tablet    Sig: Take 1 tablet (10 mg total) by mouth daily.    Dispense:  30 tablet    Refill:  2    Follow-up: Return in about 3 months (around 09/23/2020) for Depression, HTN.    Bridget Hartshorn, DO

## 2020-06-24 NOTE — Assessment & Plan Note (Signed)
She is now right at the 12 month mark since the loss of her child and still having significant symptoms affecting her life which would now classify as MDD. She endorses a fairly complex psychiatric history and was previously followed by [facility]. States she was diagnosed as bipolar type I. She is currently working with [facility] to establish care with a different psychiatrist. Will hold off on initiating any therapy and defer to psychiatry.

## 2020-06-26 NOTE — Progress Notes (Signed)
[facility] Medicine Clinic Attending  Case discussed with Dr. Bloomfield  At the time of the visit.  We reviewed the resident's history and exam and pertinent patient test results.  I agree with the assessment, diagnosis, and plan of care documented in the resident's note.  

## 2020-06-27 MED FILL — CLOPIDOGREL 75 MG TABLET: 75 | 30 days supply | Qty: 30 | Fill #0

## 2020-07-04 MED FILL — ATORVASTATIN 20 MG TABLET: 20 | 30 days supply | Qty: 30 | Fill #3

## 2020-07-04 MED FILL — METHOCARBAMOL 500 MG TABS: 500 | 5 days supply | Qty: 30 | Fill #1

## 2020-07-26 ENCOUNTER — Telehealth: Payer: Self-pay | Admitting: Internal Medicine

## 2020-07-26 ENCOUNTER — Other Ambulatory Visit: Payer: Self-pay | Admitting: Internal Medicine

## 2020-07-26 DIAGNOSIS — N926 Irregular menstruation, unspecified: Secondary | ICD-10-CM

## 2020-07-26 DIAGNOSIS — N930 Postcoital and contact bleeding: Secondary | ICD-10-CM

## 2020-07-26 DIAGNOSIS — F319 Bipolar disorder, unspecified: Secondary | ICD-10-CM

## 2020-07-26 DIAGNOSIS — N946 Dysmenorrhea, unspecified: Secondary | ICD-10-CM

## 2020-07-26 DIAGNOSIS — Z1231 Encounter for screening mammogram for malignant neoplasm of breast: Secondary | ICD-10-CM

## 2020-07-26 NOTE — Telephone Encounter (Signed)
Pt requesting a nurse to call her back.  Pt states no one has followed up with her about her lab work done on June 23, 2020.  Pt is also requesting that someone call her back about using a different facility other than [facility].

## 2020-07-26 NOTE — Addendum Note (Signed)
Addended by: Remo Lipps on: 07/26/2020 12:24 PM   Modules accepted: Orders

## 2020-07-26 NOTE — Telephone Encounter (Signed)
Hi Kendra Vega does pt need another referral to beh health (since she does not want to go to [facility]) Thanks

## 2020-07-26 NOTE — Telephone Encounter (Signed)
Called patient back regarding these issues.   Discussed her lab results, including creatinine of 1.43 which reflects progressive improvement over the past year, and hemoglobin A1c of 6.0 in the setting of Hx DM without medications. No medication changes required.   She has been seeing [facility] for integrated behavioral health and was working on getting in with a psychiatrist other than [facility] where she has had a poor experience before. She apparently did not answer the phone for her most recently scheduled visit on 7/20 and was unable to reschedule. Messaged front desk to reschedule her.  Finally, she requests OBGYN referral for irregular menses and postcoital bleeding. Referral placed.

## 2020-07-26 NOTE — Telephone Encounter (Signed)
Yes.  The patient with need to know some other Resources to use rather than [facility].  If a referral is placed [facility] should still be able to send her this information in the mail.

## 2020-07-26 NOTE — Addendum Note (Signed)
Addended by: Remo Lipps on: 07/26/2020 02:11 PM   Modules accepted: Orders

## 2020-07-26 NOTE — Addendum Note (Signed)
Addended by: Remo Lipps on: 07/26/2020 01:39 PM   Modules accepted: Orders

## 2020-07-27 MED FILL — LABETALOL HCL 200 MG TABS: 200 | 30 days supply | Qty: 60 | Fill #1

## 2020-07-27 MED FILL — CLOPIDOGREL 75 MG TABLET: 75 | 30 days supply | Qty: 30 | Fill #1

## 2020-07-27 MED FILL — METHOCARBAMOL 500 MG TABS: 500 | 5 days supply | Qty: 30 | Fill #2

## 2020-07-27 NOTE — Telephone Encounter (Signed)
Thank You.

## 2020-08-02 MED FILL — ATORVASTATIN 20 MG TABLET: 20 | 30 days supply | Qty: 30 | Fill #4

## 2020-08-03 ENCOUNTER — Encounter: Payer: Self-pay | Admitting: Licensed Clinical Social Worker

## 2020-08-15 DIAGNOSIS — Z720 Tobacco use: Secondary | ICD-10-CM | POA: Diagnosis not present

## 2020-08-15 DIAGNOSIS — E1122 Type 2 diabetes mellitus with diabetic chronic kidney disease: Secondary | ICD-10-CM | POA: Diagnosis not present

## 2020-08-15 DIAGNOSIS — N2581 Secondary hyperparathyroidism of renal origin: Secondary | ICD-10-CM | POA: Diagnosis not present

## 2020-08-15 DIAGNOSIS — D631 Anemia in chronic kidney disease: Secondary | ICD-10-CM | POA: Diagnosis not present

## 2020-08-15 DIAGNOSIS — N1832 Chronic kidney disease, stage 3b: Secondary | ICD-10-CM | POA: Diagnosis not present

## 2020-08-15 DIAGNOSIS — I129 Hypertensive chronic kidney disease with stage 1 through stage 4 chronic kidney disease, or unspecified chronic kidney disease: Secondary | ICD-10-CM | POA: Diagnosis not present

## 2020-08-15 DIAGNOSIS — I639 Cerebral infarction, unspecified: Secondary | ICD-10-CM | POA: Diagnosis not present

## 2020-08-15 DIAGNOSIS — E785 Hyperlipidemia, unspecified: Secondary | ICD-10-CM | POA: Diagnosis not present

## 2020-08-17 ENCOUNTER — Other Ambulatory Visit: Payer: Self-pay | Admitting: Internal Medicine

## 2020-08-17 ENCOUNTER — Other Ambulatory Visit (HOSPITAL_COMMUNITY): Payer: Self-pay | Admitting: Internal Medicine

## 2020-08-17 DIAGNOSIS — R252 Cramp and spasm: Secondary | ICD-10-CM

## 2020-08-17 MED FILL — METHOCARBAMOL 500 MG TABS: 500 | 5 days supply | Qty: 30 | Fill #0

## 2020-08-17 NOTE — Assessment & Plan Note (Signed)
Received refill request for robaxin. Chart review indicates she has been taking this since April. Recommend reassessing continued need at next appt in October.

## 2020-08-24 ENCOUNTER — Other Ambulatory Visit: Payer: Self-pay | Admitting: Internal Medicine

## 2020-08-24 ENCOUNTER — Other Ambulatory Visit (HOSPITAL_COMMUNITY): Payer: Self-pay | Admitting: Student

## 2020-08-29 MED FILL — ESOMEPRAZOLE MAG DR 20 MG C: 20 | 90 days supply | Qty: 90 | Fill #0

## 2020-08-30 MED FILL — CLOPIDOGREL 75 MG TABLET: 75 | 30 days supply | Qty: 30 | Fill #2

## 2020-08-30 MED FILL — LABETALOL HCL 200 MG TABS: 200 | 30 days supply | Qty: 60 | Fill #2

## 2020-08-30 MED FILL — METHOCARBAMOL 500 MG TABS: 500 | 5 days supply | Qty: 30 | Fill #1

## 2020-09-08 MED FILL — ATORVASTATIN 20 MG TABLET: 20 | 30 days supply | Qty: 30 | Fill #5

## 2020-09-16 ENCOUNTER — Encounter: Payer: Medicare HMO | Admitting: Internal Medicine

## 2020-09-19 ENCOUNTER — Other Ambulatory Visit (HOSPITAL_COMMUNITY): Payer: Self-pay | Admitting: Internal Medicine

## 2020-09-19 ENCOUNTER — Ambulatory Visit (INDEPENDENT_AMBULATORY_CARE_PROVIDER_SITE_OTHER): Payer: Medicare HMO | Admitting: Internal Medicine

## 2020-09-19 ENCOUNTER — Other Ambulatory Visit: Payer: Self-pay

## 2020-09-19 ENCOUNTER — Encounter: Payer: Self-pay | Admitting: Internal Medicine

## 2020-09-19 VITALS — BP 126/80 | HR 75 | Temp 98.3°F | Ht 65.0 in | Wt 249.2 lb

## 2020-09-19 DIAGNOSIS — I10 Essential (primary) hypertension: Secondary | ICD-10-CM | POA: Diagnosis not present

## 2020-09-19 DIAGNOSIS — Z0000 Encounter for general adult medical examination without abnormal findings: Secondary | ICD-10-CM | POA: Diagnosis not present

## 2020-09-19 DIAGNOSIS — F418 Other specified anxiety disorders: Secondary | ICD-10-CM

## 2020-09-19 DIAGNOSIS — Z8673 Personal history of transient ischemic attack (TIA), and cerebral infarction without residual deficits: Secondary | ICD-10-CM

## 2020-09-19 DIAGNOSIS — E119 Type 2 diabetes mellitus without complications: Secondary | ICD-10-CM | POA: Diagnosis not present

## 2020-09-19 MED ORDER — FERROUS SULFATE 325 (65 FE) MG PO TBEC
325.0000 mg | DELAYED_RELEASE_TABLET | Freq: Every day | ORAL | 3 refills | Status: DC
Start: 2020-09-19 — End: 2021-09-07

## 2020-09-19 MED ORDER — LABETALOL HCL 200 MG PO TABS: 200.0000 mg | ORAL_TABLET | Freq: Two times a day (BID) | ORAL | 2 refills | Status: DC

## 2020-09-19 MED ORDER — ATORVASTATIN CALCIUM 20 MG PO TABS: 20.0000 mg | ORAL_TABLET | Freq: Every day | ORAL | 6 refills | Status: DC

## 2020-09-19 MED ORDER — FUROSEMIDE 80 MG PO TABS
80.0000 mg | ORAL_TABLET | Freq: Every day | ORAL | 1 refills | Status: DC
Start: 2020-09-19 — End: 2020-12-19

## 2020-09-19 MED ORDER — CLOPIDOGREL BISULFATE 75 MG PO TABS: 75.0000 mg | ORAL_TABLET | Freq: Every day | ORAL | 6 refills | Status: DC

## 2020-09-19 MED ORDER — LISINOPRIL 10 MG PO TABS: 10.0000 mg | ORAL_TABLET | Freq: Every day | ORAL | 1 refills | Status: DC

## 2020-09-19 MED ORDER — ASPIRIN 325 MG PO TBEC
325.0000 mg | DELAYED_RELEASE_TABLET | Freq: Every day | ORAL | 0 refills | Status: DC
Start: 2020-09-19 — End: 2020-12-19

## 2020-09-19 MED ORDER — AMLODIPINE BESYLATE 10 MG PO TABS: 10.0000 mg | ORAL_TABLET | Freq: Every day | ORAL | 0 refills | Status: DC

## 2020-09-19 MED FILL — METHOCARBAMOL 500 MG TABS: 500 | 5 days supply | Qty: 30 | Fill #2

## 2020-09-19 MED FILL — FUROSEMIDE 80 MG TAB: 80 | 90 days supply | Qty: 90 | Fill #0

## 2020-09-19 MED FILL — AMLODIPINE BESYLATE 10 MG T: 10 | 90 days supply | Qty: 90 | Fill #0

## 2020-09-19 MED FILL — LISINOPRIL 10 MG TABS: 10 | 90 days supply | Qty: 90 | Fill #0

## 2020-09-19 NOTE — Patient Instructions (Signed)
Ms. Calles,  It was so great seeing you as always!   Today we discussed:   Blood pressure: It is the best I have seen it and is right where we want it. No need for any medication changes today. We will check some lab work at your next visit in 3 months.   I'm glad you will get back in with [facility] to figure out the best medications for your anxiety and bipolar disorder.   I will follow-up with your GYN visit for mammogram and pap smear screenings.   Take care! Please call sooner if you have any questions or concerns.   Dr. Chesley Mires

## 2020-09-19 NOTE — Progress Notes (Signed)
Established Patient Office Visit  Subjective:  Patient ID: Kendra Vega, female    DOB: 04/07/1971  Age: 49 y.o. MRN: [id number]  CC: HTN, depression    HPI Kendra Vega presents for follow-up on chronic HTN, depression, and risk factor management for history of CVAs. Please see problem based charting for details on today's visit.   Past Medical History:  Diagnosis Date  . Bipolar disorder (HCC)   . 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 Breast mass    rt breast ?abcess x's 1 week  . Chronic kidney disease   . Dental caries   . Depression   . GERD (gastroesophageal reflux disease)   . Hypertension   . Stroke (HCC)   . Type 2 diabetes mellitus without complication, without long-term current use of insulin (HCC) 10/21/2018    Past Surgical History:  Procedure Laterality Date  . BREAST CYST ASPIRATION Right 08/06/2017   breast abcess aspirated  . MULTIPLE TOOTH EXTRACTIONS    . TOOTH EXTRACTION N/A 12/11/2019   Procedure: EXTRACTION MOLARS;  Surgeon: Ocie Doyne, DDS;  Location: [facility] OR;  Service: Oral Surgery;  Laterality: N/A;    Family History  Problem Relation Age of Onset  . Diabetes Mother   . Hypertension Mother   . Hypertension Sister     Social History   Socioeconomic History  . Marital status: Single    Spouse name: Not on file  . Number of children: Not on file  . Years of education: Not on file  . Highest education level: Not on file  Occupational History  . Not on file  Tobacco Use  . Smoking status: Current Every Day Smoker    Packs/day: 0.20    Years: 7.00    Pack years: 1.40  . Smokeless tobacco: Never Used  . Tobacco comment: smokes 2-3 daily " Black and Milds " daily   Vaping Use  . Vaping Use: Never used  Substance and Sexual Activity  . Alcohol use: Yes    Comment: rare glass of wine  . Drug use: Yes    Frequency: 1.0 times per week    Types: Marijuana    Comment: occasional marijuana   . Sexual activity: Not  Currently    Partners: Male    Birth control/protection: None  Other Topics Concern  . Not on file  Social History Narrative  . Not on file   Social Determinants of Health   Financial Resource Strain:   . Difficulty of Paying Living Expenses: Not on file  Food Insecurity:   . Worried About Programme researcher, broadcasting/film/video in the Last Year: Not on file  . Ran Out of Food in the Last Year: Not on file  Transportation Needs:   . Lack of Transportation (Medical): Not on file  . Lack of Transportation (Non-Medical): Not on file  Physical Activity:   . Days of Exercise per Week: Not on file  . Minutes of Exercise per Session: Not on file  Stress:   . Feeling of Stress : Not on file  Social Connections:   . Frequency of Communication with Friends and Family: Not on file  . Frequency of Social Gatherings with Friends and Family: Not on file  . Attends Religious Services: Not on file  . Active Member of Clubs or Organizations: Not on file  . Attends Banker Meetings: Not on file  . Marital Status: Not on file  Intimate Partner Violence:   .  Fear of Current or Ex-Partner: Not on file  . Emotionally Abused: Not on file  . Physically Abused: Not on file  . Sexually Abused: Not on file    Outpatient Medications Prior to Visit  Medication Sig Dispense Refill  . acetaminophen (TYLENOL) 500 MG tablet Take 1,000 mg by mouth every 6 (six) hours as needed for moderate pain.     . Blood Glucose Monitoring Suppl (TRUE METRIX METER) DEVI 1 each by Does not apply route daily. 1 Device 0  . CRANBERRY PO Take 1 tablet by mouth every other day.    . esomeprazole (NEXIUM) 20 MG capsule TAKE 1 CAPSULE (20 MG TOTAL) BY MOUTH DAILY. 90 capsule 1  . glucose blood (TRUE METRIX BLOOD GLUCOSE TEST) test strip Use once daily 100 each 12  . hydrocortisone cream 1 % Apply 1 application topically 2 (two) times daily as needed for itching.    . hydrOXYzine (ATARAX/VISTARIL) 25 MG tablet Take 1 tablet (25 mg  total) by mouth 3 (three) times daily. 90 tablet 6  . lurasidone (LATUDA) 20 MG TABS tablet Take 1 tablet (20 mg total) by mouth at bedtime. 60 tablet 6  . methocarbamol (ROBAXIN) 500 MG tablet TAKE 2 TABLETS (1,000 MG TOTAL) BY MOUTH EVERY 8 (EIGHT) HOURS AS NEEDED FOR MUSCLE SPASMS. 30 tablet 2  . Omega-3 Fatty Acids (FISH OIL) 1000 MG CAPS Take 1,000 mg by mouth daily.    . Pumpkin Seed-Soy Germ (AZO BLADDER CONTROL/GO-LESS PO) Take 1 tablet by mouth 2 (two) times daily.    . TRUEPLUS LANCETS 28G MISC 1 each by Does not apply route daily. 100 each 12  . amLODipine (NORVASC) 10 MG tablet Take 1 tablet (10 mg total) by mouth daily. 90 tablet 0  . aspirin 325 MG EC tablet Take 1 tablet (325 mg total) by mouth daily. 30 tablet 0  . atorvastatin (LIPITOR) 20 MG tablet Take 1 tablet (20 mg total) by mouth daily at 6 PM. 30 tablet 6  . clopidogrel (PLAVIX) 75 MG tablet Take 1 tablet (75 mg total) by mouth daily. 30 tablet 6  . ferrous sulfate 325 (65 FE) MG EC tablet Take 1 tablet (325 mg total) by mouth daily. 30 tablet 3  . furosemide (LASIX) 80 MG tablet Take 1 tablet (80 mg total) by mouth daily. 90 tablet 1  . labetalol (NORMODYNE) 200 MG tablet Take 1 tablet (200 mg total) by mouth 2 (two) times daily. 60 tablet 2  . lisinopril (ZESTRIL) 10 MG tablet Take 1 tablet (10 mg total) by mouth daily. 90 tablet 1   No facility-administered medications prior to visit.    No Known Allergies  ROS Review of Systems  Constitutional: Negative for activity change, fatigue and fever.  HENT: Negative for hearing loss, sinus pain and sore throat.   Eyes: Negative for visual disturbance.  Respiratory: Negative for cough and shortness of breath.   Cardiovascular: Negative for chest pain, palpitations and leg swelling.  Gastrointestinal: Negative for abdominal pain, blood in stool, constipation and diarrhea.  Endocrine: Negative for polydipsia, polyphagia and polyuria.  Genitourinary: Negative for  difficulty urinating and enuresis.  Musculoskeletal: Negative for back pain and gait problem.  Neurological: Negative for dizziness, syncope, facial asymmetry, weakness and headaches.  Psychiatric/Behavioral: Positive for dysphoric mood. Negative for suicidal ideas. The patient is nervous/anxious.       Objective:    Physical Exam Constitutional:      General: She is not in acute distress.  Appearance: Normal appearance.  Eyes:     Conjunctiva/sclera: Conjunctivae normal.  Cardiovascular:     Rate and Rhythm: Normal rate and regular rhythm.  Pulmonary:     Effort: Pulmonary effort is normal.     Breath sounds: Normal breath sounds.  Abdominal:     General: There is no distension.     Palpations: Abdomen is soft.     Tenderness: There is no abdominal tenderness.  Musculoskeletal:     Cervical back: Neck supple.     Right lower leg: No edema.     Left lower leg: No edema.  Lymphadenopathy:     Cervical: No cervical adenopathy.  Skin:    General: Skin is dry.  Neurological:     Mental Status: She is alert. Mental status is at baseline.  Psychiatric:        Mood and Affect: Mood normal.        Behavior: Behavior normal.     BP 126/80 (BP Location: Left Arm, Cuff Size: Large)   Pulse 75   Temp 98.3 F (36.8 C) (Oral)   Ht 5\' 5"  (1.651 m)   Wt 249 lb 3.2 oz (113 kg)   LMP 08/20/2020   SpO2 100% Comment: room air  BMI 41.47 kg/m  Wt Readings from Last 3 Encounters:  09/19/20 249 lb 3.2 oz (113 kg)  06/23/20 (!) 250 lb 4.8 oz (113.5 kg)  03/09/20 248 lb 9.6 oz (112.8 kg)     Health Maintenance Due  Topic Date Due  . Hepatitis C Screening  Never done  . PNEUMOCOCCAL POLYSACCHARIDE VACCINE AGE 96-64 HIGH RISK  Never done  . OPHTHALMOLOGY EXAM  Never done  . COVID-19 Vaccine (1) Never done  . COLONOSCOPY  Never done  . PAP SMEAR-Modifier  08/06/2020  . FOOT EXAM  09/14/2020    There are no preventive care reminders to display for this patient.  Lab Results   Component Value Date   TSH 2.530 08/21/2018   Lab Results  Component Value Date   WBC 12.6 (H) 03/09/2020   HGB 11.6 03/09/2020   HCT 36.0 03/09/2020   MCV 74 (L) 03/09/2020   PLT 452 (H) 03/09/2020   Lab Results  Component Value Date   NA 137 06/23/2020   K 4.4 06/23/2020   CO2 20 06/23/2020   GLUCOSE 90 06/23/2020   BUN 18 06/23/2020   CREATININE 1.43 (H) 06/23/2020   BILITOT <0.2 02/04/2018   ALKPHOS 119 (H) 02/04/2018   AST 36 02/04/2018   ALT 50 (H) 02/04/2018   PROT 6.9 02/04/2018   ALBUMIN 3.9 02/04/2018   CALCIUM 9.8 06/23/2020   ANIONGAP 6 05/18/2019   Lab Results  Component Value Date   CHOL 113 08/04/2017   Lab Results  Component Value Date   HDL 28 (L) 08/04/2017   Lab Results  Component Value Date   LDLCALC 50 08/04/2017   Lab Results  Component Value Date   TRIG 176 (H) 08/04/2017   Lab Results  Component Value Date   CHOLHDL 4.0 08/04/2017   Lab Results  Component Value Date   HGBA1C 6.0 (A) 06/23/2020      Assessment & Plan:   Problem List Items Addressed This Visit      Cardiovascular and Mediastinum   Resistant hypertension - Primary    Patient's blood pressure is the best it has been in quite some time and is now at goal since increase Labetalol at last visit. She is on quadruple therapy  with Furosemide, Labetalol, Amlodipine and Lisinopril. Tolerating regimen well without adverse side effects. Will continue current therapy.  Check renal function at next visit.       Relevant Medications   amLODipine (NORVASC) 10 MG tablet   atorvastatin (LIPITOR) 20 MG tablet   aspirin 325 MG EC tablet   furosemide (LASIX) 80 MG tablet   labetalol (NORMODYNE) 200 MG tablet   lisinopril (ZESTRIL) 10 MG tablet     Endocrine   Type 2 diabetes mellitus without complication, without long-term current use of insulin (HCC)    Patient's A1C has been in normal range off of medical therapy. Will continue to monitor every 6 months.       Relevant  Medications   atorvastatin (LIPITOR) 20 MG tablet   aspirin 325 MG EC tablet   lisinopril (ZESTRIL) 10 MG tablet     Other   Depression with anxiety    Patient has done better over the last three months with grieving the loss of her child a little over a year ago. She still has intermittent moments that she describes as panic attacks. We have been trying to to get her a psychiatry referral the last several visits. She has decided to return to [facility] since they are familiar with her to try to get re-established on a stable medication regimen.       History of CVA (cerebrovascular accident)    Continue risk reduction with chronic plavix and statin. Refills sent today.       Relevant Medications   atorvastatin (LIPITOR) 20 MG tablet   clopidogrel (PLAVIX) 75 MG tablet   Healthcare maintenance    Patient is scheduled with GYN for pap smear next month and is also scheduled for mammogram next month.          Meds ordered this encounter  Medications  . amLODipine (NORVASC) 10 MG tablet    Sig: Take 1 tablet (10 mg total) by mouth daily.    Dispense:  90 tablet    Refill:  0  . atorvastatin (LIPITOR) 20 MG tablet    Sig: Take 1 tablet (20 mg total) by mouth daily at 6 PM.    Dispense:  30 tablet    Refill:  6  . aspirin 325 MG EC tablet    Sig: Take 1 tablet (325 mg total) by mouth daily.    Dispense:  30 tablet    Refill:  0  . clopidogrel (PLAVIX) 75 MG tablet    Sig: Take 1 tablet (75 mg total) by mouth daily.    Dispense:  30 tablet    Refill:  6  . ferrous sulfate 325 (65 FE) MG EC tablet    Sig: Take 1 tablet (325 mg total) by mouth daily.    Dispense:  30 tablet    Refill:  3  . furosemide (LASIX) 80 MG tablet    Sig: Take 1 tablet (80 mg total) by mouth daily.    Dispense:  90 tablet    Refill:  1  . labetalol (NORMODYNE) 200 MG tablet    Sig: Take 1 tablet (200 mg total) by mouth 2 (two) times daily.    Dispense:  60 tablet    Refill:  2  . lisinopril (ZESTRIL)  10 MG tablet    Sig: Take 1 tablet (10 mg total) by mouth daily.    Dispense:  90 tablet    Refill:  1    Follow-up: Return in about 3 months (around  12/20/2020) for CKD, HTN, DM.    Bridget Hartshorn, DO

## 2020-09-20 ENCOUNTER — Encounter: Payer: Self-pay | Admitting: Internal Medicine

## 2020-09-20 DIAGNOSIS — Z0000 Encounter for general adult medical examination without abnormal findings: Secondary | ICD-10-CM

## 2020-09-20 HISTORY — DX: Encounter for general adult medical examination without abnormal findings: Z00.00

## 2020-09-20 NOTE — Assessment & Plan Note (Signed)
Patient has done better over the last three months with grieving the loss of her child a little over a year ago. She still has intermittent moments that she describes as panic attacks. We have been trying to to get her a psychiatry referral the last several visits. She has decided to return to [facility] since they are familiar with her to try to get re-established on a stable medication regimen.

## 2020-09-20 NOTE — Assessment & Plan Note (Signed)
Patient's A1C has been in normal range off of medical therapy. Will continue to monitor every 6 months.

## 2020-09-20 NOTE — Assessment & Plan Note (Signed)
Patient is scheduled with GYN for pap smear next month and is also scheduled for mammogram next month.

## 2020-09-20 NOTE — Assessment & Plan Note (Signed)
Continue risk reduction with chronic plavix and statin. Refills sent today.

## 2020-09-20 NOTE — Assessment & Plan Note (Signed)
Patient's blood pressure is the best it has been in quite some time and is now at goal since increase Labetalol at last visit. She is on quadruple therapy with Furosemide, Labetalol, Amlodipine and Lisinopril. Tolerating regimen well without adverse side effects. Will continue current therapy.  Check renal function at next visit.

## 2020-09-26 NOTE — Progress Notes (Signed)
[facility] Medicine Clinic Attending  Case discussed with Dr. Bloomfield  At the time of the visit.  We reviewed the resident's history and exam and pertinent patient test results.  I agree with the assessment, diagnosis, and plan of care documented in the resident's note.  

## 2020-10-03 ENCOUNTER — Encounter: Payer: Self-pay | Admitting: Family Medicine

## 2020-10-03 ENCOUNTER — Other Ambulatory Visit: Payer: Self-pay

## 2020-10-03 ENCOUNTER — Ambulatory Visit (INDEPENDENT_AMBULATORY_CARE_PROVIDER_SITE_OTHER): Payer: Medicare HMO | Admitting: Family Medicine

## 2020-10-03 VITALS — BP 153/80 | HR 81 | Ht 65.0 in | Wt 249.0 lb

## 2020-10-03 DIAGNOSIS — N926 Irregular menstruation, unspecified: Secondary | ICD-10-CM | POA: Diagnosis not present

## 2020-10-03 DIAGNOSIS — Z7185 Encounter for immunization safety counseling: Secondary | ICD-10-CM | POA: Diagnosis not present

## 2020-10-03 DIAGNOSIS — Z1211 Encounter for screening for malignant neoplasm of colon: Secondary | ICD-10-CM

## 2020-10-03 NOTE — Progress Notes (Signed)
   GYNECOLOGY PROBLEM  VISIT ENCOUNTER NOTE  Subjective:   Kendra Vega is a 49 y.o. G4P0011 female here for  a problem GYN visit.  Current complaints: spotting.   Reports having regular cycles since age 17 and had a normal cycle in August but then missed 2 months but then had spotting in Sept/Oct that was associated with sexual activity. Mother went though menopause in early 75s as did various aunts.   Denies  discharge, pelvic pain, problems with intercourse or other gynecologic concerns.    Gynecologic History No LMP recorded. Contraception: none  Health Maintenance Due  Topic Date Due  . Hepatitis C Screening  Never done  . PNEUMOCOCCAL POLYSACCHARIDE VACCINE AGE 53-64 HIGH RISK  Never done  . OPHTHALMOLOGY EXAM  Never done  . COVID-19 Vaccine (1) Never done  . COLONOSCOPY  Never done  . PAP SMEAR-Modifier  08/06/2020  . FOOT EXAM  09/14/2020     The following portions of the patient's history were reviewed and updated as appropriate: allergies, current medications, past family history, past medical history, past social history, past surgical history and problem list.  Review of Systems Pertinent items are noted in HPI.   Objective:  BP (!) 153/80   Pulse 81   Ht 5\' 5"  (1.651 m)   Wt 249 lb (112.9 kg)   BMI 41.44 kg/m  Gen: well appearing, NAD HEENT: no scleral icterus CV: RR Lung: Normal WOB Ext: warm well perfused  PELVIC: Declined today   Assessment and Plan:   1. Screening for colon cancer - Reviewed HCM and patient has not been scheduled for colon cancer screening - Ambulatory referral to Gastroenterology  2. Irregular menses Likely perimenopausal and discuss normal disturbances in menstrual cycle as women approach menopause.  Discussed watchful waiting for cessation of menses Discussed low threshold for repeat exam Last pap was in 2018 NIL and negative HPV-- thus not concerned about cervical pathology Offered STI screening but declines today.  Discussed I would recommend this if she has continued spotting. Voiced understanding.  3. Grief - provided support for loss son   4. HCM - declined flu vaccine today  COVID-19 Vaccine Counseling: The patient was counseled on the potential benefits and lack of known risks of COVID vaccination. The patient's questions and concerns were addressed today, including safety of the vaccination and potential side effects as they have been published by CDC. The patient has been informed that there have not been any significant documented vaccine related injuries the COVID-19 vaccine to date. The patient has been made aware that although she is not at increased risk of contracting COVID-19, she is at increased risk of developing severe disease and complications if she contracts COVID-19 due to T2DM, HTN, history of Stroke and age. All patient questions were addressed during our visit today. The patient is not planning to get vaccinated at this time.    Face to face time: 30 minutes  Greater than 50% of the visit time was spent in counseling and coordination of care with the patient.  The summary and outline of the counseling and care coordination is summarized in the note above.   All questions were answered.  Please refer to After Visit Summary for other counseling recommendations.   Return in about 1 year (around 10/03/2021) for Yearly wellness exam.  13/06/2021, MD, MPH, ABFM Attending Physician Faculty Practice- [facility]

## 2020-10-03 NOTE — Progress Notes (Signed)
Lost of child and had random bleeding off and on. haven't had cycle in 2 months

## 2020-10-05 ENCOUNTER — Other Ambulatory Visit (HOSPITAL_COMMUNITY): Payer: Self-pay | Admitting: Student

## 2020-10-05 ENCOUNTER — Other Ambulatory Visit: Payer: Self-pay | Admitting: Internal Medicine

## 2020-10-05 DIAGNOSIS — R252 Cramp and spasm: Secondary | ICD-10-CM

## 2020-10-05 MED FILL — METHOCARBAMOL 500 MG TABS: 500 | 5 days supply | Qty: 30 | Fill #0

## 2020-10-05 MED FILL — LABETALOL HCL 200 MG TABS: 200 | 30 days supply | Qty: 60 | Fill #0

## 2020-10-05 MED FILL — CLOPIDOGREL 75 MG TABLET: 75 | 30 days supply | Qty: 30 | Fill #3

## 2020-10-05 NOTE — Assessment & Plan Note (Signed)
Received refill request on methocarbamol. States she is taking this daily for muscle cramps, which have improved. She was not aware that she should be using this prn. Since the cramps are getting better, she is agreeable to changing this from daily use to prn only. K level benign on last labs. Creatinine reflected her CKD.  -refilled methocarbamol -address for discontinuation at next visit

## 2020-10-07 ENCOUNTER — Other Ambulatory Visit: Payer: Self-pay

## 2020-10-07 ENCOUNTER — Ambulatory Visit
Admission: RE | Admit: 2020-10-07 | Discharge: 2020-10-07 | Disposition: A | Discharge status: Home / Self Care | Payer: Medicare HMO | Source: Ambulatory Visit | Attending: Internal Medicine | Admitting: Internal Medicine

## 2020-10-07 DIAGNOSIS — Z1231 Encounter for screening mammogram for malignant neoplasm of breast: Secondary | ICD-10-CM

## 2020-10-14 DIAGNOSIS — F319 Bipolar disorder, unspecified: Secondary | ICD-10-CM | POA: Diagnosis not present

## 2020-10-24 MED FILL — METHOCARBAMOL 500 MG TABS: 500 | 5 days supply | Qty: 30 | Fill #1

## 2020-10-24 MED FILL — ATORVASTATIN CALCIUM 20 MG: 20 | 30 days supply | Qty: 30 | Fill #6

## 2020-10-27 DIAGNOSIS — F4310 Post-traumatic stress disorder, unspecified: Secondary | ICD-10-CM | POA: Diagnosis not present

## 2020-11-04 MED FILL — CLOPIDOGREL 75 MG TABLET: 75 | 30 days supply | Qty: 30 | Fill #4

## 2020-11-09 DIAGNOSIS — F4310 Post-traumatic stress disorder, unspecified: Secondary | ICD-10-CM | POA: Diagnosis not present

## 2020-11-14 MED FILL — ESOMEPRAZOLE MAG DR 20 MG C: 20 | 90 days supply | Qty: 90 | Fill #1

## 2020-11-15 MED FILL — METHOCARBAMOL 500 MG TABS: 500 | 5 days supply | Qty: 30 | Fill #2

## 2020-12-01 MED FILL — LABETALOL HCL 200 MG TABS: 200 | 30 days supply | Qty: 60 | Fill #1

## 2020-12-01 MED FILL — CLOPIDOGREL 75 MG TABLET: 75 | 30 days supply | Qty: 30 | Fill #5

## 2020-12-09 DIAGNOSIS — F4310 Post-traumatic stress disorder, unspecified: Secondary | ICD-10-CM | POA: Diagnosis not present

## 2020-12-15 ENCOUNTER — Other Ambulatory Visit (HOSPITAL_COMMUNITY): Payer: Self-pay | Admitting: Internal Medicine

## 2020-12-15 ENCOUNTER — Other Ambulatory Visit: Payer: Self-pay | Admitting: Student

## 2020-12-15 DIAGNOSIS — R252 Cramp and spasm: Secondary | ICD-10-CM

## 2020-12-15 MED FILL — METHOCARBAMOL 500 MG TABS: 500 | 5 days supply | Qty: 30 | Fill #0

## 2020-12-19 ENCOUNTER — Ambulatory Visit (INDEPENDENT_AMBULATORY_CARE_PROVIDER_SITE_OTHER): Payer: Medicare HMO | Admitting: Internal Medicine

## 2020-12-19 ENCOUNTER — Encounter: Payer: Medicare HMO | Admitting: Internal Medicine

## 2020-12-19 ENCOUNTER — Other Ambulatory Visit (HOSPITAL_COMMUNITY): Payer: Self-pay | Admitting: Internal Medicine

## 2020-12-19 VITALS — BP 164/85 | HR 85 | Temp 98.0°F | Ht 65.0 in | Wt 250.4 lb

## 2020-12-19 DIAGNOSIS — E119 Type 2 diabetes mellitus without complications: Secondary | ICD-10-CM | POA: Diagnosis not present

## 2020-12-19 DIAGNOSIS — R252 Cramp and spasm: Secondary | ICD-10-CM | POA: Diagnosis not present

## 2020-12-19 DIAGNOSIS — Z8673 Personal history of transient ischemic attack (TIA), and cerebral infarction without residual deficits: Secondary | ICD-10-CM | POA: Diagnosis not present

## 2020-12-19 DIAGNOSIS — F418 Other specified anxiety disorders: Secondary | ICD-10-CM

## 2020-12-19 DIAGNOSIS — I10 Essential (primary) hypertension: Secondary | ICD-10-CM

## 2020-12-19 DIAGNOSIS — Z0000 Encounter for general adult medical examination without abnormal findings: Secondary | ICD-10-CM | POA: Diagnosis not present

## 2020-12-19 LAB — POCT GLYCOSYLATED HEMOGLOBIN (HGB A1C): Hemoglobin A1C: 5.9 % — AB (ref 4.0–5.6)

## 2020-12-19 LAB — GLUCOSE, CAPILLARY: Glucose-Capillary: 106 mg/dL — ABNORMAL HIGH (ref 70–99)

## 2020-12-19 MED ORDER — AMLODIPINE BESYLATE 10 MG PO TABS: 10.0000 mg | ORAL_TABLET | Freq: Every day | ORAL | 0 refills | Status: DC

## 2020-12-19 MED ORDER — FUROSEMIDE 80 MG PO TABS
80.0000 mg | ORAL_TABLET | Freq: Every day | ORAL | 1 refills | Status: DC
Start: 2020-12-19 — End: 2021-09-06

## 2020-12-19 MED ORDER — ATORVASTATIN CALCIUM 20 MG PO TABS: 20.0000 mg | ORAL_TABLET | Freq: Every day | ORAL | 6 refills | Status: DC

## 2020-12-19 MED ORDER — ASPIRIN 325 MG PO TBEC
325.0000 mg | DELAYED_RELEASE_TABLET | Freq: Every day | ORAL | 0 refills | Status: DC
Start: 2020-12-19 — End: 2021-06-21

## 2020-12-19 MED ORDER — LABETALOL HCL 200 MG PO TABS: 200.0000 mg | ORAL_TABLET | Freq: Two times a day (BID) | ORAL | 2 refills | Status: DC

## 2020-12-19 MED ORDER — CLOPIDOGREL BISULFATE 75 MG PO TABS: 75.0000 mg | ORAL_TABLET | Freq: Every day | ORAL | 6 refills | Status: DC

## 2020-12-19 MED ORDER — LISINOPRIL 10 MG PO TABS: 10.0000 mg | ORAL_TABLET | Freq: Every day | ORAL | 1 refills | Status: DC

## 2020-12-19 MED FILL — LISINOPRIL 10 MG TABS: 10 | 90 days supply | Qty: 90 | Fill #0

## 2020-12-19 MED FILL — FUROSEMIDE 80 MG TAB: 80 | 90 days supply | Qty: 90 | Fill #0

## 2020-12-19 MED FILL — ATORVASTATIN CALCIUM 20 MG: 20 | 30 days supply | Qty: 30 | Fill #0

## 2020-12-19 MED FILL — AMLODIPINE BESYLATE 10 MG T: 10 | 90 days supply | Qty: 90 | Fill #0

## 2020-12-19 NOTE — Patient Instructions (Signed)
Kendra Vega, It was great seeing you today.   1. We will not make any changed to your medications today.   2. Be sure to let your GYN know how your periods do over the next few months.   3. Call if you do not hear something soon about scheduling your colonoscopy  I will check your A1C today to make sure it has remained stable. Otherwise, your nephrologist can continue monitoring your labs at your next visit.   Take care, Dr. Chesley Mires

## 2020-12-19 NOTE — Progress Notes (Unsigned)
Established Patient Office Visit  Subjective:  Patient ID: Kendra Vega, female    DOB: 1971-11-23  Age: 50 y.o. MRN: [id number]  CC:  Chief Complaint  Patient presents with  . Follow-up    HPI [name] presents for follow-up on chronic HTN, anxiety, type II DM. Please see problem based charting for details on today's visit.   Past Medical History:  Diagnosis Date  . Bipolar disorder (HCC)   . 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 Breast mass    rt breast ?abcess x's 1 week  . Chronic kidney disease   . Dental caries   . Depression   . GERD (gastroesophageal reflux disease)   . Hypertension   . Stroke (HCC)   . Type 2 diabetes mellitus without complication, without long-term current use of insulin (HCC) 10/21/2018    Past Surgical History:  Procedure Laterality Date  . BREAST CYST ASPIRATION Right 08/06/2017   breast abcess aspirated  . MULTIPLE TOOTH EXTRACTIONS    . TOOTH EXTRACTION N/A 12/11/2019   Procedure: EXTRACTION MOLARS;  Surgeon: Ocie Doyne, DDS;  Location: [facility] OR;  Service: Oral Surgery;  Laterality: N/A;    Family History  Problem Relation Age of Onset  . Diabetes Mother   . Hypertension Mother   . Hypertension Sister     Social History   Socioeconomic History  . Marital status: Single    Spouse name: Not on file  . Number of children: Not on file  . Years of education: Not on file  . Highest education level: Not on file  Occupational History  . Not on file  Tobacco Use  . Smoking status: Current Every Day Smoker    Packs/day: 0.20    Years: 7.00    Pack years: 1.40  . Smokeless tobacco: Never Used  . Tobacco comment: smokes 2-3 daily " Black and Milds " daily   Vaping Use  . Vaping Use: Never used  Substance and Sexual Activity  . Alcohol use: Yes    Comment: rare glass of wine  . Drug use: Yes    Frequency: 1.0 times per week    Types: Marijuana    Comment: occasional marijuana   . Sexual activity: Not  Currently    Partners: Male    Birth control/protection: None  Other Topics Concern  . Not on file  Social History Narrative  . Not on file   Social Determinants of Health   Financial Resource Strain: Not on file  Food Insecurity: Not on file  Transportation Needs: Not on file  Physical Activity: Not on file  Stress: Not on file  Social Connections: Not on file  Intimate Partner Violence: Not on file    Outpatient Medications Prior to Visit  Medication Sig Dispense Refill  . acetaminophen (TYLENOL) 500 MG tablet Take 1,000 mg by mouth every 6 (six) hours as needed for moderate pain.     . Blood Glucose Monitoring Suppl (TRUE METRIX METER) DEVI 1 each by Does not apply route daily. 1 Device 0  . CRANBERRY PO Take 1 tablet by mouth every other day.    . esomeprazole (NEXIUM) 20 MG capsule TAKE 1 CAPSULE (20 MG TOTAL) BY MOUTH DAILY. 90 capsule 1  . ferrous sulfate 325 (65 FE) MG EC tablet Take 1 tablet (325 mg total) by mouth daily. 30 tablet 3  . glucose blood (TRUE METRIX BLOOD GLUCOSE TEST) test strip Use once daily 100 each  12  . hydrocortisone cream 1 % Apply 1 application topically 2 (two) times daily as needed for itching.    . hydrOXYzine (ATARAX/VISTARIL) 25 MG tablet Take 1 tablet (25 mg total) by mouth 3 (three) times daily. 90 tablet 6  . lurasidone (LATUDA) 20 MG TABS tablet Take 1 tablet (20 mg total) by mouth at bedtime. 60 tablet 6  . methocarbamol (ROBAXIN) 500 MG tablet TAKE 2 TABLETS (1,000 MG TOTAL) BY MOUTH EVERY 8 (EIGHT) HOURS AS NEEDED FOR MUSCLE SPASMS. 30 tablet 2  . Omega-3 Fatty Acids (FISH OIL) 1000 MG CAPS Take 1,000 mg by mouth daily.    . Pumpkin Seed-Soy Germ (AZO BLADDER CONTROL/GO-LESS PO) Take 1 tablet by mouth 2 (two) times daily.    . TRUEPLUS LANCETS 28G MISC 1 each by Does not apply route daily. 100 each 12  . amLODipine (NORVASC) 10 MG tablet Take 1 tablet (10 mg total) by mouth daily. 90 tablet 0  . aspirin 325 MG EC tablet Take 1 tablet (325  mg total) by mouth daily. 30 tablet 0  . atorvastatin (LIPITOR) 20 MG tablet Take 1 tablet (20 mg total) by mouth daily at 6 PM. 30 tablet 6  . clopidogrel (PLAVIX) 75 MG tablet Take 1 tablet (75 mg total) by mouth daily. 30 tablet 6  . furosemide (LASIX) 80 MG tablet Take 1 tablet (80 mg total) by mouth daily. 90 tablet 1  . labetalol (NORMODYNE) 200 MG tablet Take 1 tablet (200 mg total) by mouth 2 (two) times daily. 60 tablet 2  . lisinopril (ZESTRIL) 10 MG tablet Take 1 tablet (10 mg total) by mouth daily. 90 tablet 1   No facility-administered medications prior to visit.    No Known Allergies  ROS Review of Systems  Constitutional: Negative for activity change, appetite change, chills and fever.  HENT: Negative for hearing loss and trouble swallowing.   Eyes: Negative for visual disturbance.  Respiratory: Negative for cough and shortness of breath.   Cardiovascular: Negative for chest pain, palpitations and leg swelling.  Gastrointestinal: Negative for abdominal pain, constipation and diarrhea.  Endocrine: Negative for polydipsia and polyuria.  Genitourinary: Positive for menstrual problem.  Musculoskeletal: Negative for gait problem and joint swelling.  Skin: Negative for rash.  Neurological: Negative for dizziness, weakness, numbness and headaches.  Psychiatric/Behavioral: Positive for dysphoric mood. Negative for self-injury, sleep disturbance and suicidal ideas. The patient is nervous/anxious.       Objective:    Physical Exam Constitutional:      General: She is not in acute distress.    Appearance: Normal appearance.  Eyes:     Conjunctiva/sclera: Conjunctivae normal.  Cardiovascular:     Rate and Rhythm: Normal rate and regular rhythm.     Pulses: Normal pulses.  Pulmonary:     Effort: Pulmonary effort is normal.     Breath sounds: Normal breath sounds.  Abdominal:     General: There is no distension.     Palpations: Abdomen is soft.     Tenderness: There is  no abdominal tenderness.  Musculoskeletal:     Cervical back: Neck supple.     Right lower leg: No edema.     Left lower leg: No edema.  Lymphadenopathy:     Cervical: No cervical adenopathy.  Skin:    General: Skin is warm and dry.  Neurological:     General: No focal deficit present.     Mental Status: She is alert.  Psychiatric:  Attention and Perception: Attention normal.        Mood and Affect: Affect is tearful.     BP (!) 164/85 (BP Location: Right Arm, Patient Position: Sitting, Cuff Size: Small)   Pulse 85   Temp 98 F (36.7 C)   Ht 5\' 5"  (1.651 m)   Wt 250 lb 6.4 oz (113.6 kg)   SpO2 100%   BMI 41.67 kg/m  Wt Readings from Last 3 Encounters:  12/19/20 250 lb 6.4 oz (113.6 kg)  10/03/20 249 lb (112.9 kg)  09/19/20 249 lb 3.2 oz (113 kg)     Health Maintenance Due  Topic Date Due  . Hepatitis C Screening  Never done  . PNEUMOCOCCAL POLYSACCHARIDE VACCINE AGE 28-64 HIGH RISK  Never done  . OPHTHALMOLOGY EXAM  Never done  . COVID-19 Vaccine (1) Never done  . COLONOSCOPY (Pts 45-566yrs Insurance coverage will need to be confirmed)  Never done  . FOOT EXAM  09/14/2020    There are no preventive care reminders to display for this patient.  Lab Results  Component Value Date   TSH 2.530 08/21/2018   Lab Results  Component Value Date   WBC 12.6 (H) 03/09/2020   HGB 11.6 03/09/2020   HCT 36.0 03/09/2020   MCV 74 (L) 03/09/2020   PLT 452 (H) 03/09/2020   Lab Results  Component Value Date   NA 137 06/23/2020   K 4.4 06/23/2020   CO2 20 06/23/2020   GLUCOSE 90 06/23/2020   BUN 18 06/23/2020   CREATININE 1.43 (H) 06/23/2020   BILITOT <0.2 02/04/2018   ALKPHOS 119 (H) 02/04/2018   AST 36 02/04/2018   ALT 50 (H) 02/04/2018   PROT 6.9 02/04/2018   ALBUMIN 3.9 02/04/2018   CALCIUM 9.8 06/23/2020   ANIONGAP 6 05/18/2019   Lab Results  Component Value Date   CHOL 113 08/04/2017   Lab Results  Component Value Date   HDL 28 (L) 08/04/2017    Lab Results  Component Value Date   LDLCALC 50 08/04/2017   Lab Results  Component Value Date   TRIG 176 (H) 08/04/2017   Lab Results  Component Value Date   CHOLHDL 4.0 08/04/2017   Lab Results  Component Value Date   HGBA1C 5.9 (A) 12/19/2020      Assessment & Plan:   Problem List Items Addressed This Visit      Cardiovascular and Mediastinum   Resistant hypertension    Blood pressure elevated today, but patient states it's from having to walk along way and smoking a black and mild before she came in. She denies any symptoms of elevated blood pressure. Will continue management.       Relevant Medications   amLODipine (NORVASC) 10 MG tablet   aspirin 325 MG EC tablet   atorvastatin (LIPITOR) 20 MG tablet   furosemide (LASIX) 80 MG tablet   labetalol (NORMODYNE) 200 MG tablet   lisinopril (ZESTRIL) 10 MG tablet     Endocrine   Type 2 diabetes mellitus without complication, without long-term current use of insulin (HCC) - Primary    A1C remains well controlled off of any medications. Will continue monitoring annually.       Relevant Medications   aspirin 325 MG EC tablet   atorvastatin (LIPITOR) 20 MG tablet   lisinopril (ZESTRIL) 10 MG tablet   Other Relevant Orders   POC Hbg A1C (Completed)     Other   Depression with anxiety    Ms. Thole still  deals with a lot of grief over the loss of her son, but states working with a grief counselor has helped significantly. She declines any medication at this time.       History of CVA (cerebrovascular accident)   Relevant Medications   atorvastatin (LIPITOR) 20 MG tablet   clopidogrel (PLAVIX) 75 MG tablet   Muscle cramps   Healthcare maintenance    Patient recently seen by GYN who referred her for colonoscopy. Will follow-up on these results.          Meds ordered this encounter  Medications  . amLODipine (NORVASC) 10 MG tablet    Sig: Take 1 tablet (10 mg total) by mouth daily.    Dispense:  90 tablet     Refill:  0  . aspirin 325 MG EC tablet    Sig: Take 1 tablet (325 mg total) by mouth daily.    Dispense:  30 tablet    Refill:  0  . atorvastatin (LIPITOR) 20 MG tablet    Sig: Take 1 tablet (20 mg total) by mouth daily at 6 PM.    Dispense:  30 tablet    Refill:  6  . clopidogrel (PLAVIX) 75 MG tablet    Sig: Take 1 tablet (75 mg total) by mouth daily.    Dispense:  30 tablet    Refill:  6  . furosemide (LASIX) 80 MG tablet    Sig: Take 1 tablet (80 mg total) by mouth daily.    Dispense:  90 tablet    Refill:  1  . labetalol (NORMODYNE) 200 MG tablet    Sig: Take 1 tablet (200 mg total) by mouth 2 (two) times daily.    Dispense:  60 tablet    Refill:  2  . lisinopril (ZESTRIL) 10 MG tablet    Sig: Take 1 tablet (10 mg total) by mouth daily.    Dispense:  90 tablet    Refill:  1    Follow-up: Return in about 6 months (around 06/18/2021) for PCP follow-up .    Bridget Hartshorn, DO

## 2020-12-21 ENCOUNTER — Encounter: Payer: Self-pay | Admitting: Internal Medicine

## 2020-12-21 DIAGNOSIS — F4310 Post-traumatic stress disorder, unspecified: Secondary | ICD-10-CM | POA: Diagnosis not present

## 2020-12-21 NOTE — Assessment & Plan Note (Signed)
Patient recently seen by GYN who referred her for colonoscopy. Will follow-up on these results.

## 2020-12-21 NOTE — Assessment & Plan Note (Signed)
Kendra Vega still deals with a lot of grief over the loss of her son, but states working with a grief counselor has helped significantly. She declines any medication at this time.

## 2020-12-21 NOTE — Assessment & Plan Note (Signed)
A1C remains well controlled off of any medications. Will continue monitoring annually.

## 2020-12-21 NOTE — Assessment & Plan Note (Signed)
Blood pressure elevated today, but patient states it's from having to walk along way and smoking a black and mild before she came in. She denies any symptoms of elevated blood pressure. Will continue management.

## 2020-12-23 NOTE — Progress Notes (Signed)
[facility] Medicine Clinic Attending  Case discussed with Dr. Bloomfield  At the time of the visit.  We reviewed the resident's history and exam and pertinent patient test results.  I agree with the assessment, diagnosis, and plan of care documented in the resident's note.  

## 2020-12-26 MED FILL — LABETALOL HCL 200 MG TABS: 200 | 30 days supply | Qty: 60 | Fill #0

## 2020-12-26 MED FILL — CLOPIDOGREL 75 MG TABLET: 75 | 30 days supply | Qty: 30 | Fill #0

## 2020-12-27 DIAGNOSIS — Z20822 Contact with and (suspected) exposure to covid-19: Secondary | ICD-10-CM | POA: Diagnosis not present

## 2021-01-10 IMAGING — MG DIGITAL SCREENING BILAT W/ TOMO W/ CAD
8 series · 8 of 24 positions shown · non-contrast
Comparison: Previous exam(s).

CLINICAL DATA: Screening.

EXAM:
DIGITAL SCREENING BILATERAL MAMMOGRAM WITH TOMO AND CAD

[R MLO synth-2D]
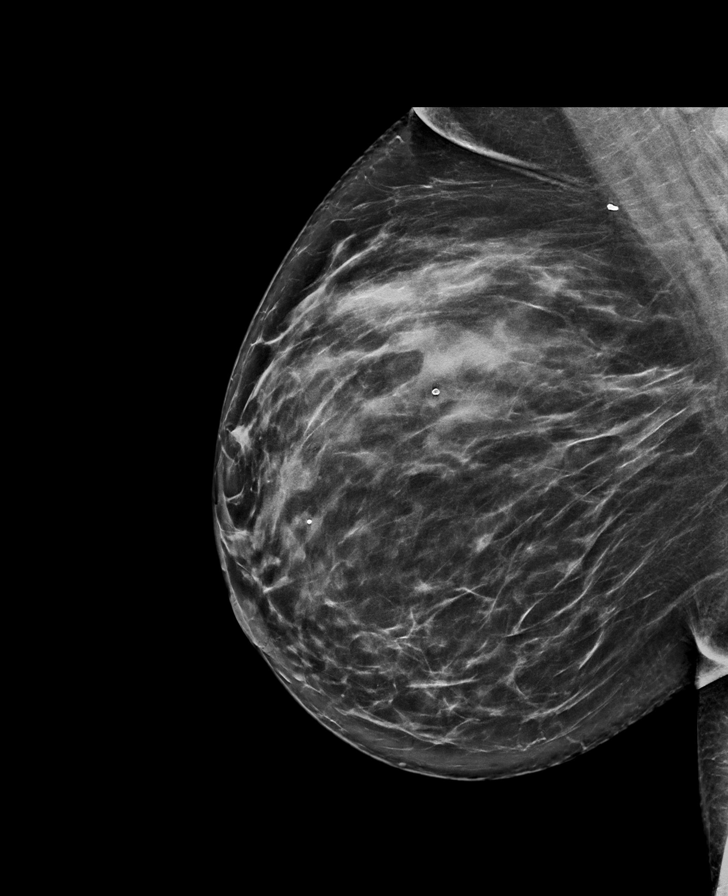

[R CC synth-2D]
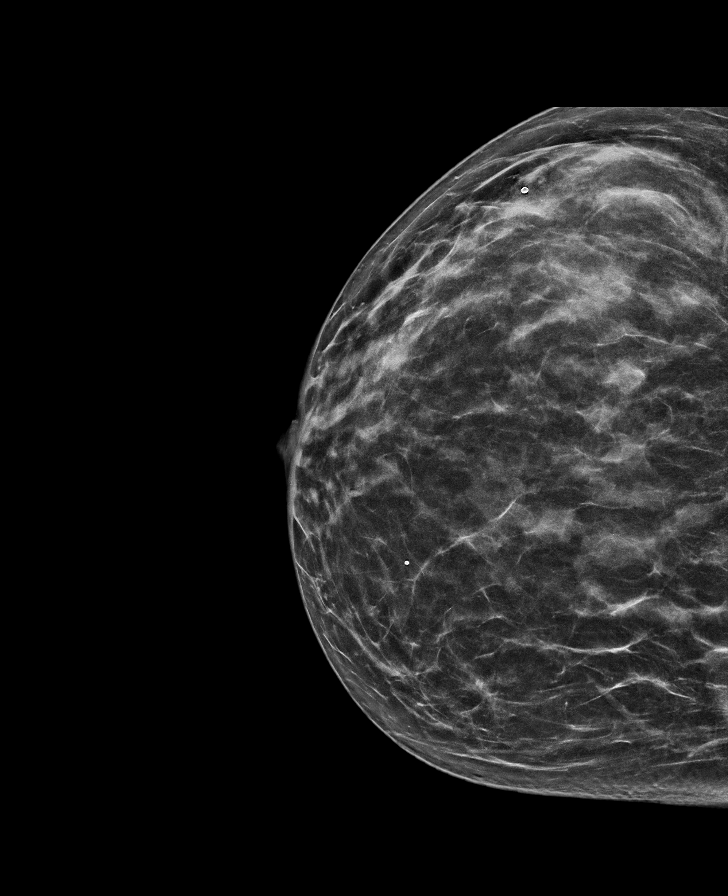

[L MLO synth-2D]
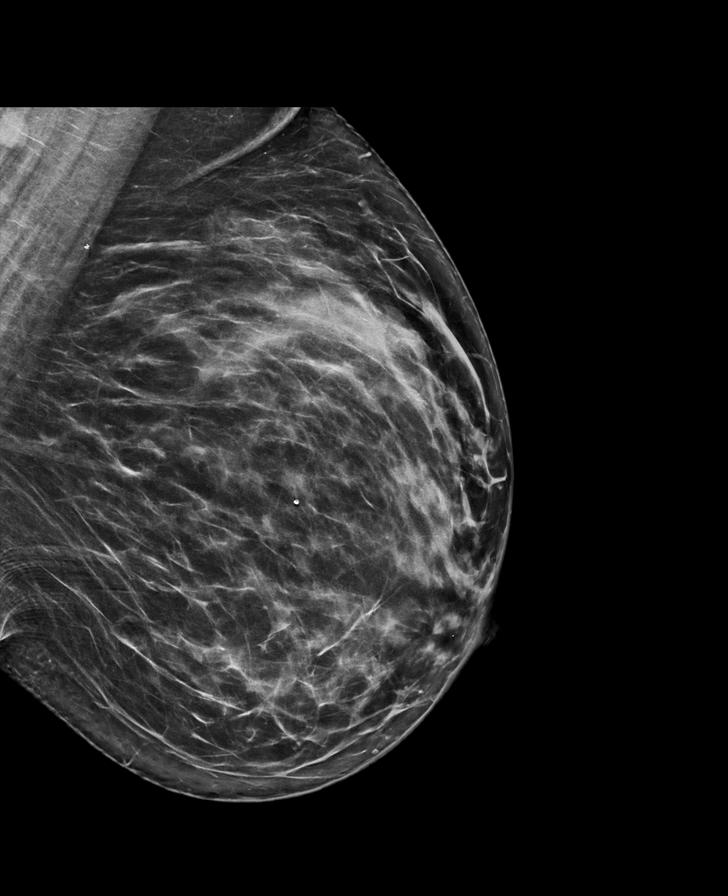

[L CC synth-2D]
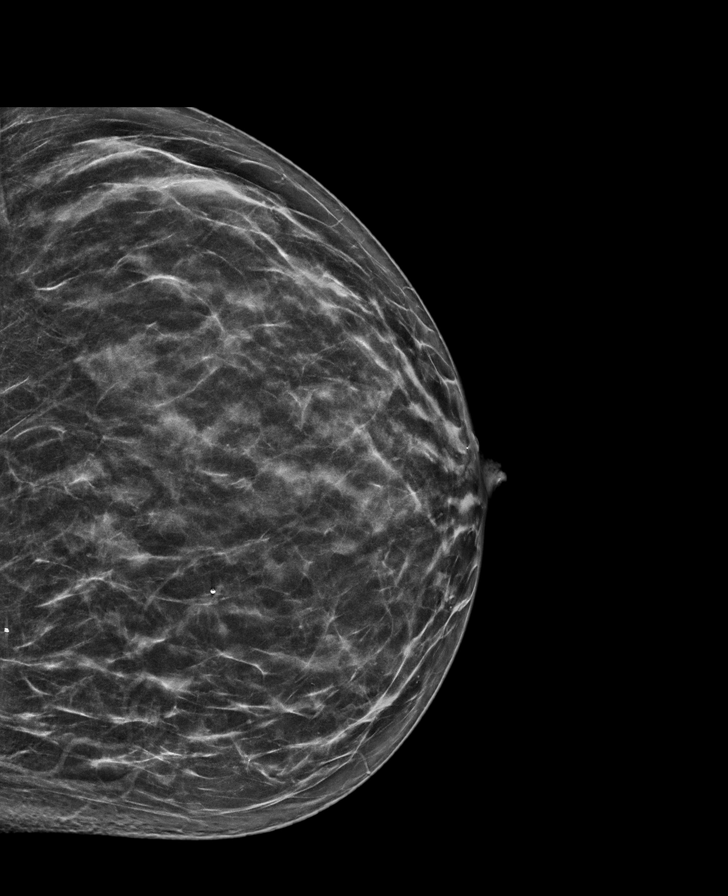

[L CC tomo · tomo slice 35/70.0]
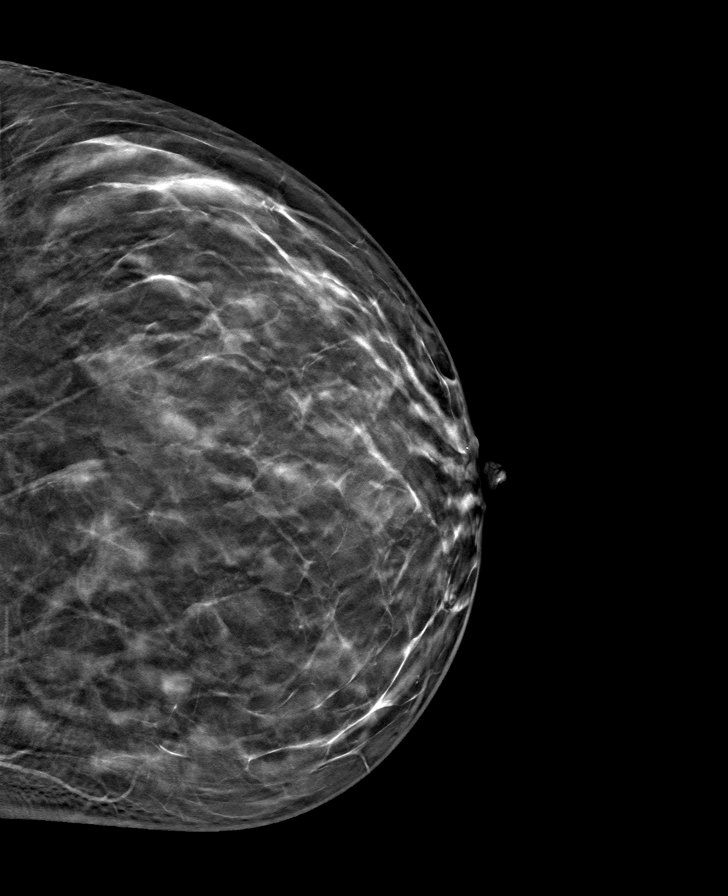

[R CC tomo · tomo slice 37/72.0]
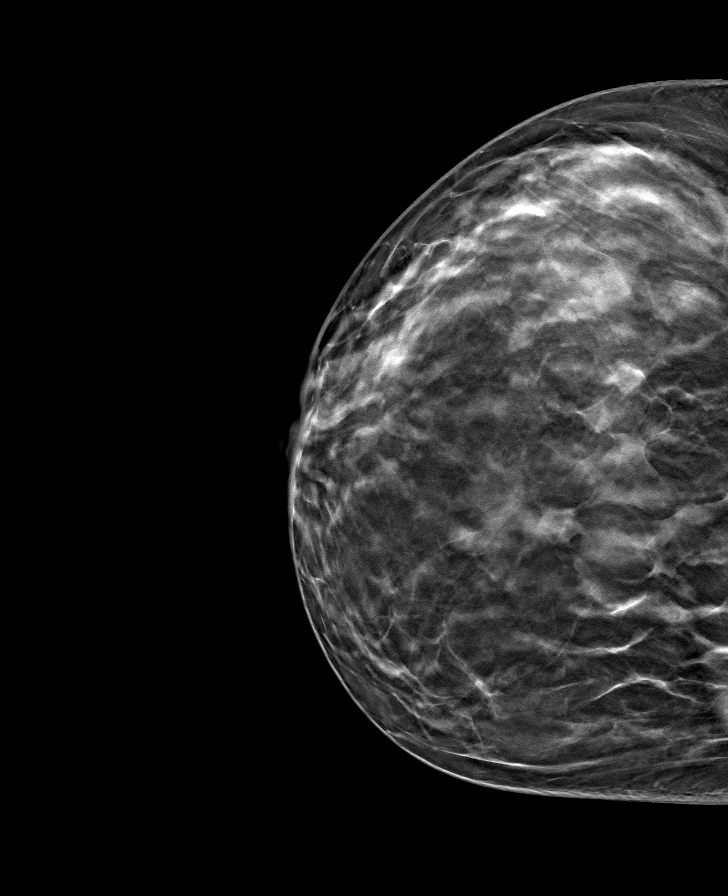

[L MLO tomo · tomo slice 43/84.0]
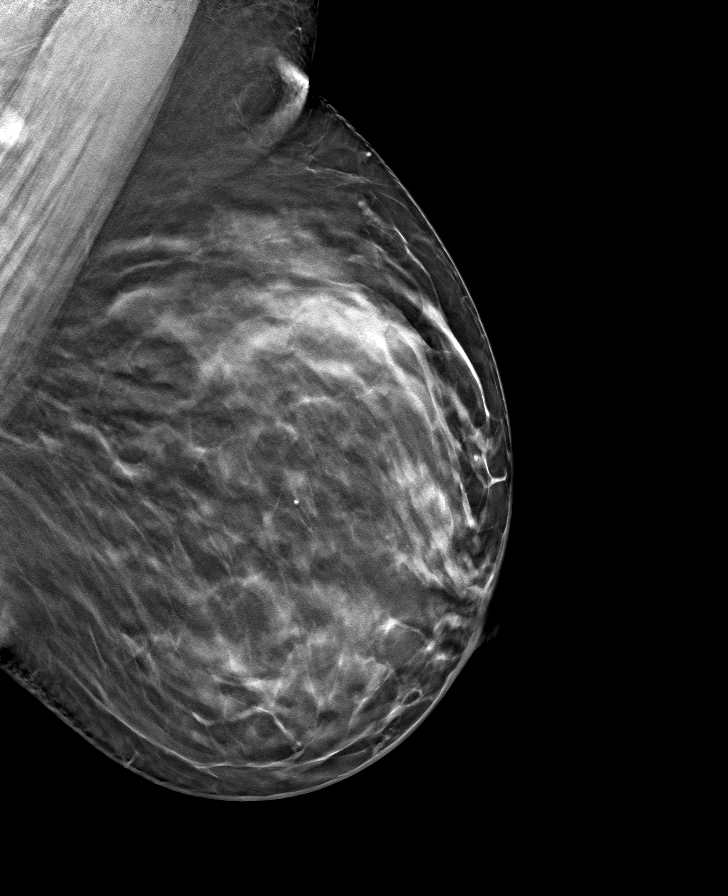

[R MLO tomo · tomo slice 45/88.0]
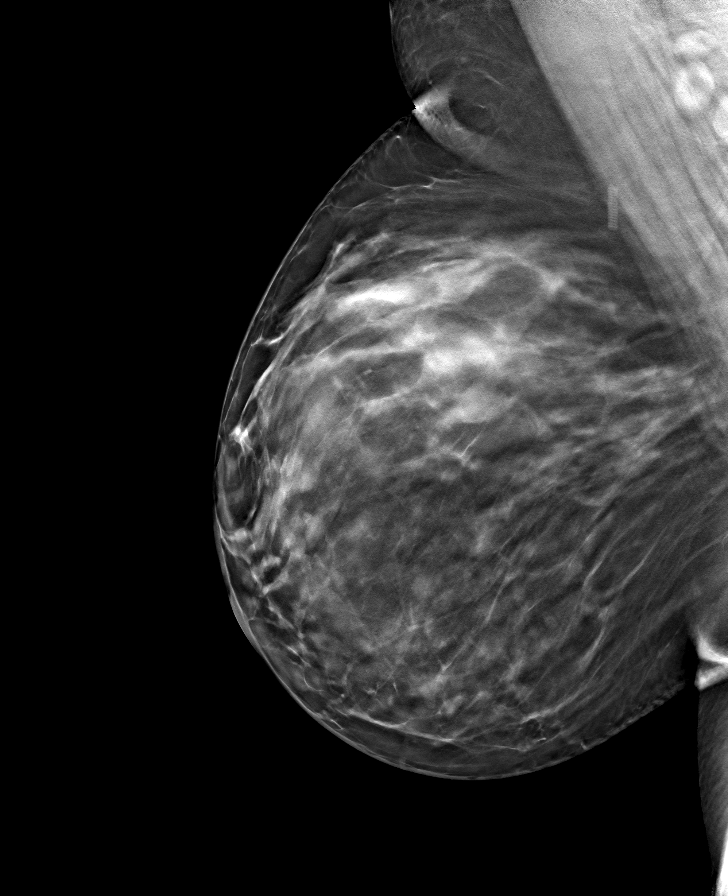

[8 of 24 positions shown; findings below may reference images not displayed]

ACR Breast Density Category c: The breast tissue is heterogeneously
dense, which may obscure small masses.
FINDINGS: There are no findings suspicious for malignancy. Images were
processed with CAD.
IMPRESSION: No mammographic evidence of malignancy. A result letter of this
screening mammogram will be mailed directly to the patient.

RECOMMENDATION:
Screening mammogram in one year. (Code:[id number])

BI-RADS CATEGORY  1: Negative.

## 2021-01-11 DIAGNOSIS — F4310 Post-traumatic stress disorder, unspecified: Secondary | ICD-10-CM | POA: Diagnosis not present

## 2021-01-11 MED FILL — METHOCARBAMOL 500 MG TABS: 500 | 5 days supply | Qty: 30 | Fill #1

## 2021-01-27 ENCOUNTER — Other Ambulatory Visit: Payer: Self-pay | Admitting: Student

## 2021-01-30 ENCOUNTER — Other Ambulatory Visit (HOSPITAL_COMMUNITY): Payer: Self-pay | Admitting: Internal Medicine

## 2021-01-30 MED FILL — METHOCARBAMOL 500 MG TABS: 500 | 5 days supply | Qty: 30 | Fill #2

## 2021-02-02 MED FILL — ESOMEPRAZOLE MAG DR 20 MG C: 20 | 90 days supply | Qty: 90 | Fill #0

## 2021-02-15 DIAGNOSIS — F319 Bipolar disorder, unspecified: Secondary | ICD-10-CM | POA: Diagnosis not present

## 2021-02-21 ENCOUNTER — Other Ambulatory Visit: Payer: Self-pay | Admitting: Internal Medicine

## 2021-02-21 DIAGNOSIS — R252 Cramp and spasm: Secondary | ICD-10-CM

## 2021-02-21 DIAGNOSIS — F4310 Post-traumatic stress disorder, unspecified: Secondary | ICD-10-CM | POA: Diagnosis not present

## 2021-02-22 ENCOUNTER — Other Ambulatory Visit (HOSPITAL_COMMUNITY): Payer: Self-pay | Admitting: Internal Medicine

## 2021-02-22 MED FILL — METHOCARBAMOL 500 MG TABS: 500 | 5 days supply | Qty: 30 | Fill #0

## 2021-03-06 MED FILL — Clopidogrel Bisulfate Tab 75 MG (Base Equiv): ORAL | 30 days supply | Qty: 30 | Fill #0 | Status: AC

## 2021-03-06 MED FILL — Labetalol HCl Tab 200 MG: ORAL | 30 days supply | Qty: 60 | Fill #0 | Status: AC

## 2021-03-07 ENCOUNTER — Other Ambulatory Visit (HOSPITAL_COMMUNITY): Payer: Self-pay

## 2021-03-09 ENCOUNTER — Ambulatory Visit (INDEPENDENT_AMBULATORY_CARE_PROVIDER_SITE_OTHER): Payer: Medicare HMO | Admitting: Internal Medicine

## 2021-03-09 VITALS — BP 143/85 | HR 79 | Temp 98.4°F | Wt 243.6 lb

## 2021-03-09 DIAGNOSIS — A059 Bacterial foodborne intoxication, unspecified: Secondary | ICD-10-CM | POA: Diagnosis not present

## 2021-03-09 DIAGNOSIS — I10 Essential (primary) hypertension: Secondary | ICD-10-CM

## 2021-03-09 NOTE — Patient Instructions (Signed)
Thank you for allowing [place] to provide your care today. Today we discussed your food poisoning.  When she to continue hydrating herself and advancing her diet slowly as tolerated.  Symptoms persist or worsen over the weekend please give [place] a call.  Please follow-up in 2 to 3 months months for blood pressure check.    Should you have any questions or concerns please call the internal medicine clinic at [phone].

## 2021-03-09 NOTE — Progress Notes (Signed)
   CC: Abdominal pain  HPI:  [name] is a 50 y.o. with a past medical history listed below presenting for abdominal pain. For details of today's visit and the status of his chronic medical issues please refer to the assessment and plan.   Past Medical History:  Diagnosis Date  . Bipolar disorder (HCC)   . 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 Breast mass    rt breast ?abcess x's 1 week  . Chronic kidney disease   . Dental caries   . Depression   . GERD (gastroesophageal reflux disease)   . Hypertension   . Stroke (HCC)   . Type 2 diabetes mellitus without complication, without long-term current use of insulin (HCC) 10/21/2018   Review of Systems:  Review of Systems  Constitutional: Positive for chills. Negative for diaphoresis and fever.  Respiratory: Negative for shortness of breath.   Cardiovascular: Negative for chest pain.  Gastrointestinal: Positive for abdominal pain, diarrhea, nausea and vomiting. Negative for blood in stool and constipation.  Neurological: Negative for dizziness and weakness.     Physical Exam:  Vitals:   03/09/21 1452  BP: (!) 143/85  Pulse: 79  Temp: 98.4 F (36.9 C)  TempSrc: Oral  SpO2: 100%  Weight: 243 lb 9.6 oz (110.5 kg)   Physical Exam Constitutional:      General: She is not in acute distress.    Appearance: She is well-developed. She is not ill-appearing.  Cardiovascular:     Rate and Rhythm: Normal rate and regular rhythm.     Heart sounds: No murmur heard. No friction rub. No gallop.   Pulmonary:     Effort: Pulmonary effort is normal.     Breath sounds: Normal breath sounds.  Abdominal:     General: Abdomen is flat. Bowel sounds are normal. There is no distension.     Palpations: Abdomen is soft.     Tenderness: There is no abdominal tenderness.  Skin:    General: Skin is warm and dry.  Neurological:     Mental Status: She is alert and oriented to person, place, and time.  Psychiatric:         Mood and Affect: Mood normal.        Behavior: Behavior normal.     Comments: Tearful on exam when discussing her son who passed away 2 years ago     Assessment & Plan:   See Encounters Tab for problem based charting.  Patient discussed with Dr. Heide Spark

## 2021-03-09 NOTE — Assessment & Plan Note (Addendum)
Vitals:   03/09/21 1452  BP: (!) 143/85  Pulse: 79  Temp: 98.4 F (36.9 C)  SpO2: 100%   Blood pressure elevated today.  Patient states that she just took her blood pressure medication before coming in.  She states that she has not been consistent with the timing of her medication.  Denies any chest pain, shortness of breath or headaches.  We will follow-up in about 2 months to see how her pressures are doing and may need to adjust her medication management.

## 2021-03-09 NOTE — Assessment & Plan Note (Signed)
Patient presents with abdominal pain that started on Monday.  She states that she went and ate [place] food and later that day ate a brownie.  Subsequently she had 1 episode of vomiting and diarrhea simultaneously.  She also had severe abdominal cramping.  Symptoms improved however Thursday night she ate 2 hotdogs and chili and her symptoms worsened with worsening abdominal cramps.  She tried an over-the-counter medication to help control her cramping pain.  States that she has been hydrating with Gatorade and water.  Denies any symptoms today.  Denies any fevers, blood in vomit or stool.  Does endorse some chills.  Discussed that this is most likely food poisoning/gastritis from something that she ate.  All her symptoms having improved is reassuring.  Recommended that she continue to advance her diet slowly as tolerated and to continue hydrating herself.  If symptoms persist or worsen she is to return to clinic.

## 2021-03-13 NOTE — Progress Notes (Signed)
Internal Medicine Clinic Attending ° °Case discussed with Dr. Rehman  At the time of the visit.  We reviewed the resident’s history and exam and pertinent patient test results.  I agree with the assessment, diagnosis, and plan of care documented in the resident’s note.  ° °

## 2021-03-14 ENCOUNTER — Encounter: Payer: Self-pay | Admitting: *Deleted

## 2021-03-14 NOTE — Progress Notes (Unsigned)

## 2021-03-15 MED FILL — Methocarbamol Tab 500 MG: ORAL | 5 days supply | Qty: 30 | Fill #0 | Status: AC

## 2021-03-16 ENCOUNTER — Other Ambulatory Visit (HOSPITAL_COMMUNITY): Payer: Self-pay

## 2021-03-24 NOTE — Progress Notes (Unsigned)
Things That May Be Affecting Your Health:  Alcohol  Hearing loss  Pain   x Depression  Home Safety  Sexual Health  x Diabetes  Lack of physical activity  Stress   Difficulty with daily activities  Loneliness  Tiredness   Drug use  Medicines x Tobacco use   Falls  Motor Vehicle Safety x Weight   Food choices  Oral Health  Other    YOUR PERSONALIZED HEALTH PLAN : 1. Schedule your next subsequent Medicare Wellness visit in one year 2. Attend all of your regular appointments to address your medical issues 3. Complete the preventative screenings and services   Annual Wellness Visit   Medicare Covered Preventative Screenings and Services  Services & Screenings Men and Women Who How Often Need? Date of Last Service Action  Abdominal Aortic Aneurysm Adults with AAA risk factors Once      Alcohol Misuse and Counseling All Adults Screening once a year if no alcohol misuse. Counseling up to 4 face to face sessions.     Bone Density Measurement  Adults at risk for osteoporosis Once every 2 yrs      Lipid Panel Z13.6 All adults without CV disease Once every 5 yrs       Colorectal Cancer   Stool sample or  Colonoscopy All adults 50 and older   Once every year  Every 10 years x       Depression All Adults Once a year x Today   Diabetes Screening Blood glucose, post glucose load, or GTT Z13.1  All adults at risk  Pre-diabetics  Once per year  Twice per year      Diabetes  Self-Management Training All adults Diabetics 10 hrs first year; 2 hours subsequent years. Requires Copay     Glaucoma  Diabetics  Family history of glaucoma  African Americans 50 yrs +  Hispanic Americans 65 yrs + Annually - requires coppay      Hepatitis C Z72.89 or F19.20  High Risk for HCV  Born between 1945 and 1965  Annually  Once x     HIV Z11.4 All adults based on risk  Annually btw ages 14 & 74 regardless of risk  Annually > 65 yrs if at increased risk      Lung Cancer Screening  Asymptomatic adults aged 38-77 with 30 pack yr history and current smoker OR quit within the last 15 yrs Annually Must have counseling and shared decision making documentation before first screen x     Medical Nutrition Therapy Adults with   Diabetes  Renal disease  Kidney transplant within past 3 yrs 3 hours first year; 2 hours subsequent years     Obesity and Counseling All adults Screening once a year Counseling if BMI 30 or higher  Today   Tobacco Use Counseling Adults who use tobacco  Up to 8 visits in one year     Vaccines Z23  Hepatitis B  Influenza   Pneumonia  Adults   Once  Once every flu season  Two different vaccines separated by one year     Next Annual Wellness Visit People with Medicare Every year  Today     Services & Screenings Women Who How Often Need  Date of Last Service Action  Mammogram  Z12.31 Women over 40 One baseline ages 35-39. Annually ager 40 yrs+      Pap tests All women Annually if high risk. Every 2 yrs for normal risk women  Screening for cervical cancer with   Pap (Z01.419 nl or Z01.411abnl) &  HPV Z11.51 Women aged 30 to 39 Once every 5 yrs     Screening pelvic and breast exams All women Annually if high risk. Every 2 yrs for normal risk women     Sexually Transmitted Diseases  Chlamydia  Gonorrhea  Syphilis All at risk adults Annually for non pregnant females at increased risk         Services & Screenings Men Who How Ofter Need  Date of Last Service Action  Prostate Cancer - DRE & PSA Men over 50 Annually.  DRE might require a copay.        Sexually Transmitted Diseases  Syphilis All at risk adults Annually for men at increased risk      Health Maintenance List Health Maintenance  Topic Date Due  . Hepatitis C Screening  Never done  . PNEUMOCOCCAL POLYSACCHARIDE VACCINE AGE 13-64 HIGH RISK  Never done  . OPHTHALMOLOGY EXAM  Never done  . COVID-19 Vaccine (1) Never done  . COLONOSCOPY (Pts 45-80yrs  Insurance coverage will need to be confirmed)  Never done  . FOOT EXAM  09/14/2020  . INFLUENZA VACCINE  10/26/2022 (Originally 06/26/2021)  . HEMOGLOBIN A1C  06/18/2021  . PAP SMEAR-Modifier  08/06/2022  . MAMMOGRAM  10/07/2022  . TETANUS/TDAP  08/04/2027  . HIV Screening  Completed  . HPV VACCINES  Aged Out

## 2021-04-02 MED FILL — Atorvastatin Calcium Tab 20 MG (Base Equivalent): ORAL | 30 days supply | Qty: 30 | Fill #0 | Status: AC

## 2021-04-02 MED FILL — Clopidogrel Bisulfate Tab 75 MG (Base Equiv): ORAL | 30 days supply | Qty: 30 | Fill #1 | Status: AC

## 2021-04-02 MED FILL — Methocarbamol Tab 500 MG: ORAL | 5 days supply | Qty: 30 | Fill #1 | Status: AC

## 2021-04-03 ENCOUNTER — Other Ambulatory Visit (HOSPITAL_COMMUNITY): Payer: Self-pay

## 2021-04-03 DIAGNOSIS — F4310 Post-traumatic stress disorder, unspecified: Secondary | ICD-10-CM | POA: Diagnosis not present

## 2021-04-10 DIAGNOSIS — I639 Cerebral infarction, unspecified: Secondary | ICD-10-CM | POA: Diagnosis not present

## 2021-04-10 DIAGNOSIS — N1832 Chronic kidney disease, stage 3b: Secondary | ICD-10-CM | POA: Diagnosis not present

## 2021-04-10 DIAGNOSIS — E1122 Type 2 diabetes mellitus with diabetic chronic kidney disease: Secondary | ICD-10-CM | POA: Diagnosis not present

## 2021-04-10 DIAGNOSIS — Z720 Tobacco use: Secondary | ICD-10-CM | POA: Diagnosis not present

## 2021-04-10 DIAGNOSIS — E785 Hyperlipidemia, unspecified: Secondary | ICD-10-CM | POA: Diagnosis not present

## 2021-04-10 DIAGNOSIS — I129 Hypertensive chronic kidney disease with stage 1 through stage 4 chronic kidney disease, or unspecified chronic kidney disease: Secondary | ICD-10-CM | POA: Diagnosis not present

## 2021-04-10 DIAGNOSIS — D631 Anemia in chronic kidney disease: Secondary | ICD-10-CM | POA: Diagnosis not present

## 2021-04-10 DIAGNOSIS — N189 Chronic kidney disease, unspecified: Secondary | ICD-10-CM | POA: Diagnosis not present

## 2021-04-10 DIAGNOSIS — N2581 Secondary hyperparathyroidism of renal origin: Secondary | ICD-10-CM | POA: Diagnosis not present

## 2021-04-19 ENCOUNTER — Other Ambulatory Visit: Payer: Self-pay | Admitting: Internal Medicine

## 2021-04-19 MED FILL — Lisinopril Tab 10 MG: ORAL | 90 days supply | Qty: 90 | Fill #0 | Status: AC

## 2021-04-19 MED FILL — Esomeprazole Magnesium Cap Delayed Release 20 MG (Base Eq): ORAL | 90 days supply | Qty: 90 | Fill #0 | Status: AC

## 2021-04-19 MED FILL — Labetalol HCl Tab 200 MG: ORAL | 30 days supply | Qty: 60 | Fill #1 | Status: AC

## 2021-04-19 MED FILL — Clopidogrel Bisulfate Tab 75 MG (Base Equiv): ORAL | 30 days supply | Qty: 30 | Fill #2 | Status: CN

## 2021-04-20 ENCOUNTER — Other Ambulatory Visit (HOSPITAL_COMMUNITY): Payer: Self-pay

## 2021-04-20 MED ORDER — AMLODIPINE BESYLATE 10 MG PO TABS
10.0000 mg | ORAL_TABLET | Freq: Every day | ORAL | 1 refills | Status: DC
Start: 2021-04-20 — End: 2021-09-07
  Filled 2021-04-20: qty 90, 90d supply, fill #0
  Filled 2021-07-24: qty 90, 90d supply, fill #1

## 2021-04-20 MED ORDER — METHOCARBAMOL 500 MG PO TABS
1000.0000 mg | ORAL_TABLET | Freq: Three times a day (TID) | ORAL | 0 refills | Status: DC | PRN
  Filled 2021-04-20: qty 15, 3d supply, fill #0

## 2021-04-25 DIAGNOSIS — Z20822 Contact with and (suspected) exposure to covid-19: Secondary | ICD-10-CM | POA: Diagnosis not present

## 2021-05-01 ENCOUNTER — Other Ambulatory Visit: Payer: Self-pay | Admitting: Student

## 2021-05-02 ENCOUNTER — Other Ambulatory Visit (HOSPITAL_COMMUNITY): Payer: Self-pay

## 2021-05-03 ENCOUNTER — Other Ambulatory Visit: Payer: Self-pay | Admitting: Student

## 2021-05-03 ENCOUNTER — Other Ambulatory Visit (HOSPITAL_COMMUNITY): Payer: Self-pay

## 2021-05-05 ENCOUNTER — Encounter: Payer: Self-pay | Admitting: *Deleted

## 2021-05-05 ENCOUNTER — Other Ambulatory Visit (HOSPITAL_COMMUNITY): Payer: Self-pay

## 2021-05-07 MED FILL — Atorvastatin Calcium Tab 20 MG (Base Equivalent): ORAL | 30 days supply | Qty: 30 | Fill #1 | Status: AC

## 2021-05-07 MED FILL — Clopidogrel Bisulfate Tab 75 MG (Base Equiv): ORAL | 30 days supply | Qty: 30 | Fill #2 | Status: AC

## 2021-05-08 ENCOUNTER — Other Ambulatory Visit (HOSPITAL_COMMUNITY): Payer: Self-pay

## 2021-05-10 ENCOUNTER — Other Ambulatory Visit: Payer: Self-pay

## 2021-05-10 ENCOUNTER — Other Ambulatory Visit (HOSPITAL_COMMUNITY): Payer: Self-pay

## 2021-05-10 DIAGNOSIS — F4310 Post-traumatic stress disorder, unspecified: Secondary | ICD-10-CM | POA: Diagnosis not present

## 2021-05-10 MED ORDER — METHOCARBAMOL 500 MG PO TABS
1000.0000 mg | ORAL_TABLET | Freq: Three times a day (TID) | ORAL | 0 refills | Status: DC | PRN
  Filled 2021-05-10: qty 15, 3d supply, fill #0

## 2021-05-10 NOTE — Telephone Encounter (Signed)
methocarbamol (ROBAXIN) 500 MG tablet, refill request @  [facility] Phone:  [phone]  Fax:  [phone]     Per patient the pharmacy is waiting for reply back from the doctor. Please call back.

## 2021-05-24 DIAGNOSIS — F4310 Post-traumatic stress disorder, unspecified: Secondary | ICD-10-CM | POA: Diagnosis not present

## 2021-06-02 ENCOUNTER — Other Ambulatory Visit (HOSPITAL_COMMUNITY): Payer: Self-pay

## 2021-06-02 ENCOUNTER — Other Ambulatory Visit: Payer: Self-pay | Admitting: Internal Medicine

## 2021-06-02 MED ORDER — METHOCARBAMOL 500 MG PO TABS
1000.0000 mg | ORAL_TABLET | Freq: Three times a day (TID) | ORAL | 0 refills | Status: DC | PRN
  Filled 2021-06-02: qty 15, 3d supply, fill #0

## 2021-06-02 MED FILL — Furosemide Tab 80 MG: ORAL | 90 days supply | Qty: 90 | Fill #0 | Status: AC

## 2021-06-13 DIAGNOSIS — F4310 Post-traumatic stress disorder, unspecified: Secondary | ICD-10-CM | POA: Diagnosis not present

## 2021-06-14 ENCOUNTER — Ambulatory Visit (INDEPENDENT_AMBULATORY_CARE_PROVIDER_SITE_OTHER): Payer: Medicare HMO | Admitting: Internal Medicine

## 2021-06-14 ENCOUNTER — Other Ambulatory Visit: Payer: Self-pay

## 2021-06-14 ENCOUNTER — Other Ambulatory Visit (HOSPITAL_COMMUNITY): Payer: Self-pay

## 2021-06-14 VITALS — BP 162/83 | HR 79 | Wt 247.3 lb

## 2021-06-14 DIAGNOSIS — F17200 Nicotine dependence, unspecified, uncomplicated: Secondary | ICD-10-CM

## 2021-06-14 DIAGNOSIS — E119 Type 2 diabetes mellitus without complications: Secondary | ICD-10-CM | POA: Diagnosis not present

## 2021-06-14 DIAGNOSIS — I10 Essential (primary) hypertension: Secondary | ICD-10-CM | POA: Diagnosis not present

## 2021-06-14 DIAGNOSIS — N1832 Chronic kidney disease, stage 3b: Secondary | ICD-10-CM

## 2021-06-14 DIAGNOSIS — E785 Hyperlipidemia, unspecified: Secondary | ICD-10-CM | POA: Diagnosis not present

## 2021-06-14 DIAGNOSIS — Z6841 Body Mass Index (BMI) 40.0 and over, adult: Secondary | ICD-10-CM

## 2021-06-14 DIAGNOSIS — Z0000 Encounter for general adult medical examination without abnormal findings: Secondary | ICD-10-CM | POA: Diagnosis not present

## 2021-06-14 DIAGNOSIS — R252 Cramp and spasm: Secondary | ICD-10-CM

## 2021-06-14 DIAGNOSIS — F418 Other specified anxiety disorders: Secondary | ICD-10-CM | POA: Diagnosis not present

## 2021-06-14 DIAGNOSIS — F4310 Post-traumatic stress disorder, unspecified: Secondary | ICD-10-CM | POA: Diagnosis not present

## 2021-06-14 DIAGNOSIS — Z8673 Personal history of transient ischemic attack (TIA), and cerebral infarction without residual deficits: Secondary | ICD-10-CM | POA: Diagnosis not present

## 2021-06-14 MED ORDER — LISINOPRIL 20 MG PO TABS
20.0000 mg | ORAL_TABLET | Freq: Every day | ORAL | 11 refills | Status: DC
  Filled 2021-06-14: qty 30, 30d supply, fill #0
  Filled 2021-07-12: qty 30, 30d supply, fill #1

## 2021-06-14 MED FILL — Atorvastatin Calcium Tab 20 MG (Base Equivalent): ORAL | 30 days supply | Qty: 30 | Fill #2 | Status: AC

## 2021-06-14 NOTE — Assessment & Plan Note (Signed)
Patient is taking apple cider vinegar supplements over the counter as she has been told that this can help with weight loss. I explained that I am not aware of any benefit of the supplement and encouraged her to add exercise to her routine and eat a healthy diet. I explained that exercise and healthy diet can also be beneficial for lowering her blood pressure, high cholesterol, and helping keep her from becoming diabetic.

## 2021-06-14 NOTE — Assessment & Plan Note (Addendum)
Unable to locate recent lipid panel; patient is currently taking lipitor 20 mg daily.  - Lipid panel drawn today - Will make changes to medications if needed when these results are available.  Addendum 06/16/2021: Triglycerides are elevated at 156 despite lipitor 20 mg. Will increase this to 40 mg.

## 2021-06-14 NOTE — Assessment & Plan Note (Addendum)
Patient due for healthcare maintenance items including hepatitis C screening, annual eye exam, pneumonia vaccine, shingles vaccines, colonoscopy, and foot exam. She denied pneumonia vaccine and shingles vaccine prescription stating that any time she receives a vaccine, she "has a stroke." She has requested a referral to ophthalmology to establish care, podiatry for recurrent warts on the soles of her feet, GI for colonoscopy. Hepatitis C screening labs drawn at today's visit. She did have questions regarding Pap smear but on [facility] document from April 2022 she does not appear to be due for this until next year. I have advised her to follow-up with OB/gyn for additional questions regarding irregular menses and menopause.  Addendum 06/16/2021: Hepatitis C screening negative.

## 2021-06-14 NOTE — Assessment & Plan Note (Signed)
Patient states that she smokes 1-2 black and milds daily.

## 2021-06-14 NOTE — Assessment & Plan Note (Addendum)
No BMP results in our system since July 2021.  - BMP drawn today. - Continue medications as prescribed.  Addendum 06/16/2021: Mild drop in GFR from 05/2020 to 34. Recommended patient continue new dose of lisinopril 20 mg prescribed at visit on 06/14/2021. She will follow up for recheck of BMP in 4 weeks.

## 2021-06-14 NOTE — Assessment & Plan Note (Signed)
Last HbA1c in January 2022 was 5.9%, placing her in pre-diabetes range rather than diabetes; patient is currently not on medication for diabetes. On chart review it seems that this patient is being followed for development of diabetes from current pre-diabetes on an annual basis. - Recheck HbA1c in January 2023

## 2021-06-14 NOTE — Assessment & Plan Note (Signed)
Patient is persistently hypertensive in clinic (162/83 today) and reports home BP readings in the range of 150s/70s. She feels that increased stress from the anniversary of her son's death is causing her BP to be elevated at this time, however I explained to her that the consistently high readings at home are concerning for more than a stress-related increase.  - Increase lisinopril to 20 mg daily - Return to clinic in one month for repeat BP and BMP

## 2021-06-14 NOTE — Assessment & Plan Note (Signed)
Patient lost her son 2 years ago this month and states that she was the person who found him. She is still experiencing significant grief but sees a grief counselor (last visit yesterday) and a psychologist (has an appointment this week) who she states help her manage her grief. She denies suicidal ideations.

## 2021-06-14 NOTE — Assessment & Plan Note (Signed)
Patient complains of muscle cramps that do not seem to be responsive to twice daily methocarbamol 500 mg, however she does say that two spoon fulls of mustard help alleviate the symptoms. - Patient advised to use the spoon fulls of mustard as primary treatment of her muscle cramps and only take methocarbamol if there is still no relief.

## 2021-06-14 NOTE — Patient Instructions (Signed)
Thank you for visiting the [facility] today. It was a pleasure to meet you! We discussed your blood pressure, wellness screenings and care needs, and muscle spasms today.  I have ordered the following for you:  Lab orders: CBC BMP Lipid panel Hepatitis C screening  Tests ordered: None  Referrals: GI for colonoscopy Neurology for history of strokes Podiatry for recurrent foot concerns Ophthalmology for establish regular vision care  Medication changes: Please increase your lisinopril dose to 20 mg daily. I will also send a new prescription in for you. Continue taking methocarbamol for muscle spasms, but only as a back up for when mustard does not alleviate the spasms. Decrease aspirin to 81 mg daily. Continue all other medications as prescribed.  Follow-up: One month for blood pressure and BMP recheck.  Remember: If you have any questions or concerns, please call our clinic at [phone] between 9am-5pm and after hours call [phone] and ask for the internal medicine resident on call. If you feel you are having a medical emergency please call 911.  Also: the phone number for your OB/GYN is [phone].  Champ Mungo, DO

## 2021-06-14 NOTE — Assessment & Plan Note (Signed)
Patient denies having established care with neurology despite stroke history. - Continue plavix 75 mg - Decrease aspirin to 81 mg daily - Neurology referral placed - Continue HTN and HLD management

## 2021-06-14 NOTE — Progress Notes (Signed)
CC: follow up, medications  HPI:  [name] is a 50 y.o. female with past medical history as listed below who presents today for a routine follow-up and medication refills.  Of note, she is due for a series of healthcare maintenance items including hepatitis C screening, annual eye exam, pneumonia vaccine, shingles vaccines, colonoscopy, and foot exam.  She is currently experiencing increased grief as this month is the two year anniversary of her son's death. She states that she feels like this could be the reason that her blood pressure is high. Home BP readings are in the 150s/70s. She is followed by a grief counselor and psychologist, both of whom she has visits with this week. She denies any suicidal ideation and endorses a strong support system at home.  She states that she is taking apple cider vinegar supplements over the counter as she has been told that this can help with weight loss. She does not currently exercise though she states she wants to lose weight and be smaller and wants to start exercising.   She denies any concerns related to weakness, speech changes, sensory changes. She does complain of muscle cramps that she says are not responsive to twice daily methocarbamol 500 mg, however she does say that two spoon fulls of mustard help alleviate the symptoms. She states that she feels like she is immune to the effects of methocarbamol at this point.  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  Respiratory:   Positive for shortness of breath (with exertion from time to time, feels like it's related to weight). Negative for cough.   Cardiovascular:  Negative for chest pain and leg swelling.  Gastrointestinal:  Negative for abdominal pain, constipation and diarrhea.  Genitourinary:  Negative for dysuria, frequency and urgency.  Musculoskeletal:  Negative for myalgias.  Neurological:  Negative for dizziness, sensory change, speech change, loss of consciousness and weakness.  Psychiatric/Behavioral:  Positive for depression. Negative for suicidal ideas.     Physical Exam:  Vitals:   06/14/21 1114  BP: (!) 162/83  Pulse: 79  SpO2: 100%  Weight: 247 lb 4.8 oz (112.2 kg)   Physical Exam Vitals and nursing note reviewed.  Constitutional:      Appearance: Normal appearance.  Eyes:     Comments: R eye everted. L eye aligned appropriately.  Neck:     Vascular: No JVD.  Cardiovascular:     Rate and Rhythm: Normal rate and regular rhythm.     Pulses:          Radial pulses are 2+ on the right side and 2+ on the left side.       Posterior tibial pulses are 2+ on the right side and 2+ on the left side.     Heart sounds: Normal heart sounds.  Pulmonary:     Effort: Pulmonary effort is normal.     Breath sounds: Normal breath sounds and air entry.  Abdominal:     Palpations: Abdomen is soft.     Tenderness: There is no abdominal tenderness.  Musculoskeletal:     Right lower leg: No edema.     Left lower leg: No edema.  Skin:    General: Skin is warm and dry.  Neurological:     General: No focal deficit present.     Mental Status: She is alert and oriented to person, place, and time.     Gait: Gait is intact.  Psychiatric:        Mood and Affect: Mood and affect normal.        Behavior: Behavior normal. Behavior is cooperative.     Assessment & Plan:   See Encounters Tab for problem based charting.  Patient seen with Dr. Heide Spark

## 2021-06-15 ENCOUNTER — Other Ambulatory Visit (HOSPITAL_COMMUNITY): Payer: Self-pay

## 2021-06-15 ENCOUNTER — Other Ambulatory Visit: Payer: Self-pay | Admitting: Internal Medicine

## 2021-06-15 LAB — BMP8+ANION GAP
Anion Gap: 15 mmol/L (ref 10.0–18.0)
BUN/Creatinine Ratio: 13 (ref 9–23)
BUN: 23 mg/dL (ref 6–24)
CO2: 23 mmol/L (ref 20–29)
Calcium: 9.7 mg/dL (ref 8.7–10.2)
Chloride: 102 mmol/L (ref 96–106)
Creatinine, Ser: 1.79 mg/dL — ABNORMAL HIGH (ref 0.57–1.00)
Glucose: 92 mg/dL (ref 65–99)
Potassium: 4.3 mmol/L (ref 3.5–5.2)
Sodium: 140 mmol/L (ref 134–144)
eGFR: 34 mL/min/{1.73_m2} — ABNORMAL LOW (ref >59)

## 2021-06-15 LAB — CBC
Hematocrit: 38.2 % (ref 34.0–46.6)
Hemoglobin: 12.7 g/dL (ref 11.1–15.9)
MCH: 25.9 pg — ABNORMAL LOW (ref 26.6–33.0)
MCHC: 33.2 g/dL (ref 31.5–35.7)
MCV: 78 fL — ABNORMAL LOW (ref 79–97)
Platelets: 347 10*3/uL (ref 150–450)
RBC: 4.91 x10E6/uL (ref 3.77–5.28)
RDW: 16.5 % — ABNORMAL HIGH (ref 11.7–15.4)
WBC: 10 10*3/uL (ref 3.4–10.8)

## 2021-06-15 LAB — LIPID PANEL
Chol/HDL Ratio: 4.1 ratio (ref 0.0–4.4)
Cholesterol, Total: 139 mg/dL (ref 100–199)
HDL: 34 mg/dL — ABNORMAL LOW (ref >39)
LDL Chol Calc (NIH): 78 mg/dL (ref 0–99)
Triglycerides: 156 mg/dL — ABNORMAL HIGH (ref 0–149)
VLDL Cholesterol Cal: 27 mg/dL (ref 5–40)

## 2021-06-15 LAB — HEPATITIS C ANTIBODY: Hep C Virus Ab: 0.1 s/co ratio (ref 0.0–0.9)

## 2021-06-15 NOTE — Progress Notes (Signed)
[facility] Medicine Clinic Attending  I saw and evaluated the patient.  I personally confirmed the key portions of the history and exam documented by Dr.  Dean  and I reviewed pertinent patient test results.  The assessment, diagnosis, and plan were formulated together and I agree with the documentation in the resident's note.  

## 2021-06-16 ENCOUNTER — Telehealth: Payer: Self-pay | Admitting: Internal Medicine

## 2021-06-16 ENCOUNTER — Other Ambulatory Visit (HOSPITAL_COMMUNITY): Payer: Self-pay

## 2021-06-16 MED ORDER — METHOCARBAMOL 500 MG PO TABS
1000.0000 mg | ORAL_TABLET | Freq: Three times a day (TID) | ORAL | 0 refills | Status: DC | PRN
  Filled 2021-06-16: qty 15, 3d supply, fill #0

## 2021-06-16 MED ORDER — ATORVASTATIN CALCIUM 40 MG PO TABS
40.0000 mg | ORAL_TABLET | Freq: Every day | ORAL | 11 refills | Status: DC
Start: 2021-06-16 — End: 2021-07-24
  Filled 2021-06-16: qty 30, 30d supply, fill #0

## 2021-06-16 NOTE — Telephone Encounter (Signed)
Spoke with patient to let her know results. - Hepatitis C antibody negative - Slight decrease in kidney function--continue lisinopril 20 mg as prescribed at visit on 06/14/2021 - Increase atorvastatin to 40 mg daily. A new prescription for this has been sent in.  - Follow up in 4 weeks to recheck BMP

## 2021-06-16 NOTE — Addendum Note (Signed)
Addended by: Ihor Dow on: 06/16/2021 02:19 PM   Modules accepted: Orders

## 2021-06-19 ENCOUNTER — Other Ambulatory Visit (HOSPITAL_COMMUNITY): Payer: Self-pay

## 2021-06-19 MED FILL — Labetalol HCl Tab 200 MG: ORAL | 30 days supply | Qty: 60 | Fill #0 | Status: AC

## 2021-06-20 ENCOUNTER — Ambulatory Visit (INDEPENDENT_AMBULATORY_CARE_PROVIDER_SITE_OTHER): Payer: Medicare HMO | Admitting: Podiatry

## 2021-06-20 ENCOUNTER — Other Ambulatory Visit: Payer: Self-pay

## 2021-06-20 DIAGNOSIS — Q828 Other specified congenital malformations of skin: Secondary | ICD-10-CM | POA: Diagnosis not present

## 2021-06-20 NOTE — Patient Instructions (Signed)
Look for urea 40% with salicyclic acid cream or ointment and apply to the thickened dry skin / calluses. This can be bought over the counter, at a pharmacy or online such as [employer].   Aperture pads (with the holes on it) can be bought on [employer]

## 2021-06-21 ENCOUNTER — Encounter: Payer: Self-pay | Admitting: Pharmacist

## 2021-06-21 ENCOUNTER — Ambulatory Visit (INDEPENDENT_AMBULATORY_CARE_PROVIDER_SITE_OTHER): Payer: Medicare HMO | Admitting: Pharmacist

## 2021-06-21 DIAGNOSIS — Z0000 Encounter for general adult medical examination without abnormal findings: Secondary | ICD-10-CM

## 2021-06-21 NOTE — Progress Notes (Signed)
This AWV is being conducted by TELEHEALTH - AUDIO only. The patient was located at home and I was located in [facility]. The patient's identity was confirmed using their DOB and current address. The patient or his/her legal guardian has consented to being evaluated through a telephone encounter and understands the associated risks (an examination cannot be done and the patient may need to come in for an appointment) / benefits (allows the patient to remain at home, decreasing exposure to coronavirus). I personally spent 42 minutes conducting the AWV.  Subjective:   Kendra Vega is a 50 y.o. female who presents for a Medicare Annual Wellness Visit.  The following items have been reviewed and updated today in the appropriate area in the EMR.   Health Risk Assessment  Height, weight, BMI, and BP Visual acuity if needed Depression screen Fall risk / safety level Advance directive discussion Medical and family history were reviewed and updated Updating list of other providers & suppliers Medication reconciliation, including over the counter medicines Cognitive screen Written screening schedule Risk Factor list Personalized health advice, risky behaviors, and treatment advice  Social History   Social History Narrative   Current Social History 06/21/2021        Patient lives with a significant other in an apartment which is 1 story/stories. There are not steps up to the entrance the patient uses.       Patient's method of transportation is via family member or public transportation if needed.      The highest level of education was some high school.      The patient currently disabled but would like part time job.       Identified important Relationships are "my dogs"      Pets : 2       Interests / Fun: "I love to shop"       Current Stressors: son's passing and her bills      Religious / Personal Beliefs: "I just believe in Jesus Christ"         Cardiac Risk Factors  include: diabetes mellitus;hypertension;obesity (BMI >30kg/m2);sedentary lifestyle;smoking/ tobacco exposure    Objective:    Vitals: There were no vitals taken for this visit. Vitals are unable to obtained due to COVID-19 public health emergency  Activities of Daily Living In your present state of health, do you have any difficulty performing the following activities: 06/21/2021 06/14/2021  Hearing? N N  Vision? Y N  Comment Blind in right eye -  Difficulty concentrating or making decisions? Y N  Walking or climbing stairs? Y Y  Comment - -  Dressing or bathing? N N  Doing errands, shopping? N N  Preparing Food and eating ? N -  Using the Toilet? N -  In the past six months, have you accidently leaked urine? N -  Do you have problems with loss of bowel control? N -  Managing your Medications? N -  Managing your Finances? N -  Housekeeping or managing your Housekeeping? N -  Some recent data might be hidden    Goals  Goals      Exercise 3x per week (30 min per time)     Set My Weight Loss Goal     - set weight loss goal of 80 lbs    Why is this important?   Losing only 5 to 15 percent of your weight makes a big difference in your health.    Notes:  Fall Risk Fall Risk  06/21/2021 06/14/2021 03/09/2021 12/19/2020 09/19/2020  Falls in the past year? 0 0 0 0 0  Number falls in past yr: 0 0 0 0 -  Injury with Fall? 0 0 0 0 -  Risk for fall due to : No Fall Risks No Fall Risks No Fall Risks - No Fall Risks  Follow up Falls evaluation completed;Falls prevention discussed Falls evaluation completed Falls evaluation completed - Falls prevention discussed    Depression Screen PHQ 2/9 Scores 06/21/2021 06/14/2021 03/09/2021 12/19/2020  PHQ - 2 Score 6 2 0 0  PHQ- 9 Score 16 13 6  -  Exception Documentation - - - -     Cognitive Testing Six-Item Cognitive Screener   "I would like to ask you some questions that ask you to use your memory. I am going to name three  objects. Please wait until I say all three words, then repeat them. Remember what they are  because I am going to ask you to name them again in a few minutes. Please repeat these words for me: APPLE--TABLE--PENNY." (Interviewer may repeat names 3 times if necessary but repetition not scored.)  Did patient correctly repeat all three words? Yes - may proceed with screen  What year is this? Correct What month is this? Correct What day of the week is this? Correct  What were the three objects I asked you to remember? Apple Correct Table Correct Penny Correct  Score one point for each incorrect answer.  A score of 2 or more points warrants additional investigation.  Patient's score 0  Assessment and Plan:  During the course of the visit the patient was educated and counseled about appropriate screening and preventive services as documented in the assessment and plan.  Patient states she was waiting on a referral to GI for her colonscopy. Discussed GI should reach out to schedule appt but that I did not know the time-frame for this.  Encouraged patient to work towards goal of exercising 150 minutes/week.  Discussed elevated PHQ9 score. Patient currently seeks treatment at [facility]. Patient did express interest with seeing an additional psychiatrist that has ability to prescribe medications.  The printed AVS was given to the patient and included an updated screening schedule, a list of risk factors, and personalized health advice.        [facility], RPH-CPP  06/21/2021

## 2021-06-21 NOTE — Patient Instructions (Addendum)
Things That May Be Affecting Your Health:   Alcohol   Hearing loss   Pain   x Depression   Home Safety   Sexual Health  x Diabetes   Lack of physical activity   Stress    Difficulty with daily activities   Loneliness   Tiredness    Drug use   Medicines x Tobacco use    Falls   Motor Vehicle Safety x Weight    Food choices   Oral Health   Other      YOUR PERSONALIZED HEALTH PLAN : 1. Schedule your next subsequent Medicare Wellness visit in one year 2. Attend all of your regular appointments to address your medical issues 3. Complete the preventative screenings and services 4.  A referral was put in to obtain a colonoscopy. They should hopefully reach out soon. 5. Please reach out when you are ready to quit smoking. We are always here for you!     Annual Wellness Visit                       Medicare Covered Preventative Screenings and Services   Services & Screenings Men and Women Who How Often Need? Date of Last Service Action  Abdominal Aortic Aneurysm Adults with AAA risk factors Once        Alcohol Misuse and Counseling All Adults Screening once a year if no alcohol misuse. Counseling up to 4 face to face sessions.        Bone Density Measurement Adults at risk for osteoporosis Once every 2 yrs        Lipid Panel Z13.6 All adults without CV disease Once every 5 yrs            Colorectal Cancer Stool sample or Colonoscopy All adults 50 and older   Once every year Every 10 years x            Depression All Adults Once a year x Today    Diabetes Screening Blood glucose, post glucose load, or GTT Z13.1 All adults at risk Pre-diabetics Once per year Twice per year          Diabetes  Self-Management Training All adults Diabetics 10 hrs first year; 2 hours subsequent years. Requires Copay        Glaucoma Diabetics Family history of glaucoma African Americans 50 yrs + Hispanic Americans 65 yrs + Annually - requires coppay          Hepatitis C Z72.89 or F19.20 High Risk  for HCV Born between 1945 and 1965 Annually Once x        HIV Z11.4 All adults based on risk Annually btw ages 55 & 94 regardless of risk Annually > 65 yrs if at increased risk          Lung Cancer Screening Asymptomatic adults aged 49-77 with 30 pack yr history and current smoker OR quit within the last 15 yrs Annually Must have counseling and shared decision making documentation before first screen x        Medical Nutrition Therapy Adults with Diabetes Renal disease Kidney transplant within past 3 yrs 3 hours first year; 2 hours subsequent years        Obesity and Counseling All adults Screening once a year Counseling if BMI 30 or higher   Today    Tobacco Use Counseling Adults who use tobacco Up to 8 visits in one year        Vaccines Z23  Hepatitis B Influenza Pneumonia Adults   Once Once every flu season Two different vaccines separated by one year        Next Annual Wellness Visit People with Medicare Every year   Today        Services & Screenings Women Who How Often Need Date of Last Service Action  Mammogram  Z12.31 Women over 40 One baseline ages 50-39. Annually ager 40 yrs+          Pap tests All women Annually if high risk. Every 2 yrs for normal risk women          Screening for cervical cancer with Pap (Z01.419 nl or Z01.411abnl) & HPV Z11.51 Women aged 81 to 42 Once every 5 yrs        Screening pelvic and breast exams All women Annually if high risk. Every 2 yrs for normal risk women        Sexually Transmitted Diseases Chlamydia Gonorrhea Syphilis All at risk adults Annually for non pregnant females at increased risk                Services & Screenings Men Who How Ofter Need Date of Last Service Action  Prostate Cancer - DRE & PSA Men over 50 Annually. DRE might require a copay.              Sexually Transmitted Diseases Syphilis All at risk adults Annually for men at increased risk          Health Maintenance List     Health Maintenance   Topic Date Due   Hepatitis C Screening Never done   PNEUMOCOCCAL POLYSACCHARIDE VACCINE AGE 66-64 HIGH RISK Never done   OPHTHALMOLOGY EXAM Never done   COVID-19 Vaccine (1) Never done   COLONOSCOPY (Pts 45-96yrs Insurance coverage will need to be confirmed) Never done   FOOT EXAM 09/14/2020   INFLUENZA VACCINE 10/26/2022 (Originally 06/26/2021)   HEMOGLOBIN A1C 06/18/2021   PAP SMEAR-Modifier 08/06/2022   MAMMOGRAM 10/07/2022   TETANUS/TDAP 08/04/2027   HIV Screening Completed   HPV VACCINES Aged Out   Colonoscopy, Adult A colonoscopy is a procedure to look at the entire large intestine. This procedure is done using a long, thin, flexible tube that has a camera on theend. You may have a colonoscopy: As a part of normal colorectal screening. If you have certain symptoms, such as: A low number of red blood cells in your blood (anemia). Diarrhea that does not go away. Pain in your abdomen. Blood in your stool. A colonoscopy can help screen for and diagnose medical problems, including: Tumors. Extra tissue that grows where mucus forms (polyps). Inflammation. Areas of bleeding. Tell your health care provider about: Any allergies you have. All medicines you are taking, including vitamins, herbs, eye drops, creams, and over-the-counter medicines. Any problems you or family members have had with anesthetic medicines. Any blood disorders you have. Any surgeries you have had. Any medical conditions you have. Any problems you have had with having bowel movements. Whether you are pregnant or may be pregnant. What are the risks? Generally, this is a safe procedure. However, problems may occur, including: Bleeding. Damage to your intestine. Allergic reactions to medicines given during the procedure. Infection. This is rare. What happens before the procedure? Eating and drinking restrictions Follow instructions from your health care provider about eating or drinking restrictions, which  may include: A few days before the procedure: Follow a low-fiber diet. Avoid nuts, seeds, dried fruit, raw fruits, and  vegetables. 1-3 days before the procedure: Eat only gelatin dessert or ice pops. Drink only clear liquids, such as water, clear juice, clear broth or bouillon, black coffee or tea, or clear soft drinks or sports drinks. Avoid liquids that contain red or purple dye. The day of the procedure: Do not eat solid foods. You may continue to drink clear liquids until up to 2 hours before the procedure. Do not eat or drink anything starting 2 hours before the procedure, or within the time period that your health care provider recommends. Bowel prep If you were prescribed a bowel prep to take by mouth (orally) to clean out your colon: Take it as told by your health care provider. Starting the day before your procedure, you will need to drink a large amount of liquid medicine. The liquid will cause you to have many bowel movements of loose stool until your stool becomes almost clear or light green. If your skin or the opening between the buttocks (anus) gets irritated from diarrhea, you may relieve the irritation using: Wipes with medicine in them, such as adult wet wipes with aloe and vitamin E. A product to soothe skin, such as petroleum jelly. If you vomit while drinking the bowel prep: Take a break for up to 60 minutes. Begin the bowel prep again. Call your health care provider if you keep vomiting or you cannot take the bowel prep without vomiting. To clean out your colon, you may also be given: Laxative medicines. These help you have a bowel movement. Instructions for enema use. An enema is liquid medicine injected into your rectum. Medicines Ask your health care provider about: Changing or stopping your regular medicines or supplements. This is especially important if you are taking iron supplements, diabetes medicines, or blood thinners. Taking medicines such as aspirin and  ibuprofen. These medicines can thin your blood. Do not take these medicines unless your health care provider tells you to take them. Taking over-the-counter medicines, vitamins, herbs, and supplements. General instructions Ask your health care provider what steps will be taken to help prevent infection. These may include washing skin with a germ-killing soap. Plan to have someone take you home from the hospital or clinic. What happens during the procedure?  An IV will be inserted into one of your veins. You may be given one or more of the following: A medicine to help you relax (sedative). A medicine to numb the area (local anesthetic). A medicine to make you fall asleep (general anesthetic). This is rarely needed. You will lie on your side with your knees bent. The tube will: Have oil or gel put on it (be lubricated). Be inserted into your anus. Be gently eased through all parts of your large intestine. Air will be sent into your colon to keep it open. This may cause some pressure or cramping. Images will be taken with the camera and will appear on a screen. A small tissue sample may be removed to be looked at under a microscope (biopsy). The tissue may be sent to a lab for testing if any signs of problems are found. If small polyps are found, they may be removed and checked for cancer cells. When the procedure is finished, the tube will be removed. The procedure may vary among health care providers and hospitals. What happens after the procedure? Your blood pressure, heart rate, breathing rate, and blood oxygen level will be monitored until you leave the hospital or clinic. You may have a small amount of blood  in your stool. You may pass gas and have mild cramping or bloating in your abdomen. This is caused by the air that was used to open your colon during the exam. Do not drive for 24 hours after the procedure. It is up to you to get the results of your procedure. Ask your health care  provider, or the department that is doing the procedure, when your results will be ready. Summary A colonoscopy is a procedure to look at the entire large intestine. Follow instructions from your health care provider about eating and drinking before the procedure. If you were prescribed an oral bowel prep to clean out your colon, take it as told by your health care provider. During the colonoscopy, a flexible tube with a camera on its end is inserted into the anus and then passed into the other parts of the large intestine. This information is not intended to replace advice given to you by your health care provider. Make sure you discuss any questions you have with your healthcare provider. Document Revised: 06/05/2019 Document Reviewed: 06/05/2019 Elsevier Patient Education  2022 [employer].  Fall Prevention in the Home, Adult Falls can cause injuries and can happen to people of all ages. There are many things you can do to make your home safe and to help prevent falls. Ask forhelp when making these changes. What actions can I take to prevent falls? General Instructions Use good lighting in all rooms. Replace any light bulbs that burn out. Turn on the lights in dark areas. Use night-lights. Keep items that you use often in easy-to-reach places. Lower the shelves around your home if needed. Set up your furniture so you have a clear path. Avoid moving your furniture around. Do not have throw rugs or other things on the floor that can make you trip. Avoid walking on wet floors. If any of your floors are uneven, fix them. Add color or contrast paint or tape to clearly mark and help you see: Grab bars or handrails. First and last steps of staircases. Where the edge of each step is. If you use a stepladder: Make sure that it is fully opened. Do not climb a closed stepladder. Make sure the sides of the stepladder are locked in place. Ask someone to hold the stepladder while you use it. Know  where your pets are when moving through your home. What can I do in the bathroom?     Keep the floor dry. Clean up any water on the floor right away. Remove soap buildup in the tub or shower. Use nonskid mats or decals on the floor of the tub or shower. Attach bath mats securely with double-sided, nonslip rug tape. If you need to sit down in the shower, use a plastic, nonslip stool. Install grab bars by the toilet and in the tub and shower. Do not use towel bars as grab bars. What can I do in the bedroom? Make sure that you have a light by your bed that is easy to reach. Do not use any sheets or blankets for your bed that hang to the floor. Have a firm chair with side arms that you can use for support when you get dressed. What can I do in the kitchen? Clean up any spills right away. If you need to reach something above you, use a step stool with a grab bar. Keep electrical cords out of the way. Do not use floor polish or wax that makes floors slippery. What can I  do with my stairs? Do not leave any items on the stairs. Make sure that you have a light switch at the top and the bottom of the stairs. Make sure that there are handrails on both sides of the stairs. Fix handrails that are broken or loose. Install nonslip stair treads on all your stairs. Avoid having throw rugs at the top or bottom of the stairs. Choose a carpet that does not hide the edge of the steps on the stairs. Check carpeting to make sure that it is firmly attached to the stairs. Fix carpet that is loose or worn. What can I do on the outside of my home? Use bright outdoor lighting. Fix the edges of walkways and driveways and fix any cracks. Remove anything that might make you trip as you walk through a door, such as a raised step or threshold. Trim any bushes or trees on paths to your home. Check to see if handrails are loose or broken and that both sides of all steps have handrails. Install guardrails along the  edges of any raised decks and porches. Clear paths of anything that can make you trip, such as tools or rocks. Have leaves, snow, or ice cleared regularly. Use sand or salt on paths during winter. Clean up any spills in your garage right away. This includes grease or oil spills. What other actions can I take? Wear shoes that: Have a low heel. Do not wear high heels. Have rubber bottoms. Feel good on your feet and fit well. Are closed at the toe. Do not wear open-toe sandals. Use tools that help you move around if needed. These include: Canes. Walkers. Scooters. Crutches. Review your medicines with your doctor. Some medicines can make you feel dizzy. This can increase your chance of falling. Ask your doctor what else you can do to help prevent falls. Where to find more information Centers for Disease Control and Prevention, [employer]: FootballExhibition.com.br [employer] on Aging: https://walker.com/ Contact a doctor if: You are afraid of falling at home. You feel weak, drowsy, or dizzy at home. You fall at home. Summary There are many simple things that you can do to make your home safe and to help prevent falls. Ways to make your home safe include removing things that can make you trip and installing grab bars in the bathroom. Ask for help when making these changes in your home. This information is not intended to replace advice given to you by your health care provider. Make sure you discuss any questions you have with your healthcare provider. Document Revised: 06/15/2020 Document Reviewed: 06/15/2020 Elsevier Patient Education  2022 Else[employer] Inc[employer]  Health Maintenance, Female Adopting a healthy lifestyle and getting preventive care are important in promoting health and wellness. Ask your health care provider about: The right schedule for you to have regular tests and exams. Things you can do on your own to prevent diseases and keep yourself healthy. What should I know about diet, weight,  and exercise? Eat a healthy diet  Eat a diet that includes plenty of vegetables, fruits, low-fat dairy products, and lean protein. Do not eat a lot of foods that are high in solid fats, added sugars, or sodium.  Maintain a healthy weight Body mass index (BMI) is used to identify weight problems. It estimates body fat based on height and weight. Your health care provider can help determineyour BMI and help you achieve or maintain a healthy weight. Get regular exercise Get regular exercise. This is one of the  most important things you can do for your health. Most adults should: Exercise for at least 150 minutes each week. The exercise should increase your heart rate and make you sweat (moderate-intensity exercise). Do strengthening exercises at least twice a week. This is in addition to the moderate-intensity exercise. Spend less time sitting. Even light physical activity can be beneficial. Watch cholesterol and blood lipids Have your blood tested for lipids and cholesterol at 51 years of age, then havethis test every 5 years. Have your cholesterol levels checked more often if: Your lipid or cholesterol levels are high. You are older than 51 years of age. You are at high risk for heart disease. What should I know about cancer screening? Depending on your health history and family history, you may need to have cancer screening at various ages. This may include screening for: Breast cancer. Cervical cancer. Colorectal cancer. Skin cancer. Lung cancer. What should I know about heart disease, diabetes, and high blood pressure? Blood pressure and heart disease High blood pressure causes heart disease and increases the risk of stroke. This is more likely to develop in people who have high blood pressure readings, are of African descent, or are overweight. Have your blood pressure checked: Every 3-5 years if you are 28-26 years of age. Every year if you are 37 years old or  older. Diabetes Have regular diabetes screenings. This checks your fasting blood sugar level. Have the screening done: Once every three years after age 69 if you are at a normal weight and have a low risk for diabetes. More often and at a younger age if you are overweight or have a high risk for diabetes. What should I know about preventing infection? Hepatitis B If you have a higher risk for hepatitis B, you should be screened for this virus. Talk with your health care provider to find out if you are at risk forhepatitis B infection. Hepatitis C Testing is recommended for: Everyone born from [birth year] through 1965. Anyone with known risk factors for hepatitis C. Sexually transmitted infections (STIs) Get screened for STIs, including gonorrhea and chlamydia, if: You are sexually active and are younger than 51 years of age. You are older than 51 years of age and your health care provider tells you that you are at risk for this type of infection. Your sexual activity has changed since you were last screened, and you are at increased risk for chlamydia or gonorrhea. Ask your health care provider if you are at risk. Ask your health care provider about whether you are at high risk for HIV. Your health care provider may recommend a prescription medicine to help prevent HIV infection. If you choose to take medicine to prevent HIV, you should first get tested for HIV. You should then be tested every 3 months for as long as you are taking the medicine. Pregnancy If you are about to stop having your period (premenopausal) and you may become pregnant, seek counseling before you get pregnant. Take 400 to 800 micrograms (mcg) of folic acid every day if you become pregnant. Ask for birth control (contraception) if you want to prevent pregnancy. Osteoporosis and menopause Osteoporosis is a disease in which the bones lose minerals and strength with aging. This can result in bone fractures. If you are 74 years old  or older, or if you are at risk for osteoporosis and fractures, ask your health care provider if you should: Be screened for bone loss. Take a calcium or vitamin D supplement to  lower your risk of fractures. Be given hormone replacement therapy (HRT) to treat symptoms of menopause. Follow these instructions at home: Lifestyle Do not use any products that contain nicotine or tobacco, such as cigarettes, e-cigarettes, and chewing tobacco. If you need help quitting, ask your health care provider. Do not use street drugs. Do not share needles. Ask your health care provider for help if you need support or information about quitting drugs. Alcohol use Do not drink alcohol if: Your health care provider tells you not to drink. You are pregnant, may be pregnant, or are planning to become pregnant. If you drink alcohol: Limit how much you use to 0-1 drink a day. Limit intake if you are breastfeeding. Be aware of how much alcohol is in your drink. In the [place] one drink equals one 12 oz bottle of beer (355 mL), one 5 oz glass of wine (148 mL), or one 1 oz glass of hard liquor (44 mL). General instructions Schedule regular health, dental, and eye exams. Stay current with your vaccines. Tell your health care provider if: You often feel depressed. You have ever been abused or do not feel safe at home. Summary Adopting a healthy lifestyle and getting preventive care are important in promoting health and wellness. Follow your health care provider's instructions about healthy diet, exercising, and getting tested or screened for diseases. Follow your health care provider's instructions on monitoring your cholesterol and blood pressure. This information is not intended to replace advice given to you by your health care provider. Make sure you discuss any questions you have with your healthcare provider. Document Revised: 11/05/2018 Document Reviewed: 11/05/2018 Elsevier Patient Education  2022 Elsevier  Inc[employer]  Steps to Quit Smoking Smoking tobacco is the leading cause of preventable death. It can affect almost every organ in the body. Smoking puts you and those around you at risk for developing many serious chronic diseases. Quitting smoking can be difficult, but it is one of the best things that you can do for your health. It is nevertoo late to quit. How do I get ready to quit? When you decide to quit smoking, create a plan to help you succeed. Before you quit: Pick a date to quit. Set a date within the next 2 weeks to give you time to prepare. Write down the reasons why you are quitting. Keep this list in places where you will see it often. Tell your family, friends, and co-workers that you are quitting. Support from your loved ones can make quitting easier. Talk with your health care provider about your options for quitting smoking. Find out what treatment options are covered by your health insurance. Identify people, places, things, and activities that make you want to smoke (triggers). Avoid them. What first steps can I take to quit smoking? Throw away all cigarettes at home, at work, and in your car. Throw away smoking accessories, such as Set designerashtrays and lighters. Clean your car. Make sure to empty the ashtray. Clean your home, including curtains and carpets. What strategies can I use to quit smoking? Talk with your health care provider about combining strategies, such as taking medicines while you are also receiving in-person counseling. Using these two strategies together makes you more likely to succeed in quitting than if you used either strategy on its own. If you are pregnant or breastfeeding, talk with your health care provider about finding counseling or other support strategies to quit smoking. Do not take medicine to help you quit smoking unless your health  care provider tells you to do so. To quit smoking: Quit right away Quit smoking completely, instead of gradually reducing  how much you smoke over a period of time. Research shows that stopping smoking right away is more successful than gradually quitting. Attend in-person counseling to help you build problem-solving skills. You are more likely to succeed in quitting if you attend counseling sessions regularly. Even short sessions of 10 minutes can be effective. Take medicine You may take medicines to help you quit smoking. Some medicines require a prescription and some you can purchase over-the-counter. Medicines may have nicotine in them to replace the nicotine in cigarettes. Medicines may: Help to stop cravings. Help to relieve withdrawal symptoms. Your health care provider may recommend: Nicotine patches, gum, or lozenges. Nicotine inhalers or sprays. Non-nicotine medicine that is taken by mouth. Find resources Find resources and support systems that can help you to quit smoking and remain smoke-free after you quit. These resources are most helpful when you use them often. They include: Online chats with a Veterinary surgeon. Telephone quitlines. Printed Materials engineer. Support groups or group counseling. Text messaging programs. Mobile phone apps or applications. Use apps that can help you stick to your quit plan by providing reminders, tips, and encouragement. There are many free apps for mobile devices as well as websites. Examples include Quit Guide from the [employer] and smokefree.gov What things can I do to make it easier to quit?  Reach out to your family and friends for support and encouragement. Call telephone quitlines (1-800-QUIT-NOW), reach out to support groups, or work with a counselor for support. Ask people who smoke to avoid smoking around you. Avoid places that trigger you to smoke, such as bars, parties, or smoke-break areas at work. Spend time with people who do not smoke. Lessen the stress in your life. Stress can be a smoking trigger for some people. To lessen stress, try: Exercising  regularly. Doing deep-breathing exercises. Doing yoga. Meditating. Performing a body scan. This involves closing your eyes, scanning your body from head to toe, and noticing which parts of your body are particularly tense. Try to relax the muscles in those areas. How will I feel when I quit smoking? Day 1 to 3 weeks Within the first 24 hours of quitting smoking, you may start to feel withdrawal symptoms. These symptoms are usually most noticeable 2-3 days after quitting, but they usually do not last for more than 2-3 weeks. You may experience these symptoms: Mood swings. Restlessness, anxiety, or irritability. Trouble concentrating. Dizziness. Strong cravings for sugary foods and nicotine. Mild weight gain. Constipation. Nausea. Coughing or a sore throat. Changes in how the medicines that you take for unrelated issues work in your body. Depression. Trouble sleeping (insomnia). Week 3 and afterward After the first 2-3 weeks of quitting, you may start to notice more positive results, such as: Improved sense of smell and taste. Decreased coughing and sore throat. Slower heart rate. Lower blood pressure. Clearer skin. The ability to breathe more easily. Fewer sick days. Quitting smoking can be very challenging. Do not get discouraged if you are not successful the first time. Some people need to make many attempts to quit before they achieve long-term success. Do your best to stick to your quit plan, and talk with your health care provider if you haveany questions or concerns. Summary Smoking tobacco is the leading cause of preventable death. Quitting smoking is one of the best things that you can do for your health. When you decide to  quit smoking, create a plan to help you succeed. Quit smoking right away, not slowly over a period of time. When you start quitting, seek help from your health care provider, family, or friends. This information is not intended to replace advice given to  you by your health care provider. Make sure you discuss any questions you have with your healthcare provider. Document Revised: 08/07/2019 Document Reviewed: 01/31/2019 Elsevier Patient Education  2022 [employer].

## 2021-06-21 NOTE — Progress Notes (Signed)
  Subjective:  Patient ID: Kendra Vega, female    DOB: Apr 22, 1971,  MRN: [id number]  Chief Complaint  Patient presents with   Callouses    bil plantars warts;requested to see another provider    50 y.o. female presents with the above complaint. History confirmed with patient.  They have been very helpful.  She would like to get these under control.  She states she does not have diabetes and has not been on medication for this  Objective:  Physical Exam: warm, good capillary refill, no trophic changes or ulcerative lesions, normal DP and PT pulses, and normal sensory exam.  Bilaterally she has multiple large porokeratotic lesions the most painful which is in the mid arch Assessment:   1. Porokeratosis      Plan:  Patient was evaluated and treated and all questions answered.  I discussed treatment of these from an debridement, pedicures and surgical excision.  Discussed that surgical excision is often unsuccessful and leads to recurrence or painful scars.  I discussed using a keratolytic agent such as urea cream and she will begin using this as well as her medication.  I discussed with her that unfortunate she does not qualify under Medicare guidelines for intermittent debridement but she does not have any qualifying conditions for this which she understands.  She will continue to work on this with urea cream and self debridement at home and return for follow-up needed  Return in about 3 months (around 09/20/2021) for to check on calluses/corns.

## 2021-06-22 ENCOUNTER — Ambulatory Visit: Payer: Medicare HMO | Admitting: Podiatry

## 2021-06-28 ENCOUNTER — Encounter: Payer: Self-pay | Admitting: Gastroenterology

## 2021-06-28 ENCOUNTER — Other Ambulatory Visit (HOSPITAL_COMMUNITY): Payer: Self-pay

## 2021-06-28 ENCOUNTER — Telehealth: Payer: Self-pay

## 2021-06-28 MED FILL — Clopidogrel Bisulfate Tab 75 MG (Base Equiv): ORAL | 30 days supply | Qty: 30 | Fill #3 | Status: AC

## 2021-06-28 NOTE — Telephone Encounter (Signed)
RTC, patient states she cannot find her Plavix.  Per chart review, pt has 2 refills available at [facility].  Pt states she can't remember when she last filled Plavix, but she only had a few pills remaining in RX bottle. RN instructed pt to call [facility] and ask them to try and run refill through.  RN advised pt if pharmacy can't run RX through d/t insurance denial because it is too early for refill, she will need to call her insurance company and explain the situation to [employer].  She verbalized understanding.  No further needs. SChaplin, RN,BSN

## 2021-06-28 NOTE — Telephone Encounter (Signed)
Pt states she lost her blood thinner medication, requesting the nurse to call her back.

## 2021-06-29 NOTE — Progress Notes (Signed)
I discussed the AWV findings with the provider who conducted the visit. I was present in the office suite and immediately available to provide assistance and direction throughout the time the service was provided.  ?

## 2021-06-30 DIAGNOSIS — F4310 Post-traumatic stress disorder, unspecified: Secondary | ICD-10-CM | POA: Diagnosis not present

## 2021-07-04 ENCOUNTER — Other Ambulatory Visit: Payer: Self-pay | Admitting: Internal Medicine

## 2021-07-05 ENCOUNTER — Other Ambulatory Visit (HOSPITAL_COMMUNITY): Payer: Self-pay

## 2021-07-05 ENCOUNTER — Other Ambulatory Visit: Payer: Self-pay | Admitting: Internal Medicine

## 2021-07-11 ENCOUNTER — Other Ambulatory Visit: Payer: Self-pay | Admitting: Internal Medicine

## 2021-07-11 ENCOUNTER — Other Ambulatory Visit (HOSPITAL_COMMUNITY): Payer: Self-pay

## 2021-07-11 MED ORDER — ESOMEPRAZOLE MAGNESIUM 20 MG PO CPDR
20.0000 mg | DELAYED_RELEASE_CAPSULE | Freq: Every day | ORAL | 1 refills | Status: DC
  Filled 2021-07-11: qty 90, 90d supply, fill #0

## 2021-07-12 ENCOUNTER — Encounter: Payer: Self-pay | Admitting: Internal Medicine

## 2021-07-12 ENCOUNTER — Ambulatory Visit (INDEPENDENT_AMBULATORY_CARE_PROVIDER_SITE_OTHER): Payer: Medicare HMO | Admitting: Internal Medicine

## 2021-07-12 ENCOUNTER — Other Ambulatory Visit (HOSPITAL_COMMUNITY): Payer: Self-pay

## 2021-07-12 ENCOUNTER — Other Ambulatory Visit: Payer: Self-pay

## 2021-07-12 VITALS — BP 159/95 | HR 79 | Temp 98.7°F | Ht 65.0 in | Wt 250.3 lb

## 2021-07-12 DIAGNOSIS — I10 Essential (primary) hypertension: Secondary | ICD-10-CM

## 2021-07-12 DIAGNOSIS — E119 Type 2 diabetes mellitus without complications: Secondary | ICD-10-CM

## 2021-07-12 DIAGNOSIS — Z8673 Personal history of transient ischemic attack (TIA), and cerebral infarction without residual deficits: Secondary | ICD-10-CM | POA: Diagnosis not present

## 2021-07-12 DIAGNOSIS — I1A0 Resistant hypertension: Secondary | ICD-10-CM

## 2021-07-12 DIAGNOSIS — R252 Cramp and spasm: Secondary | ICD-10-CM

## 2021-07-12 DIAGNOSIS — E785 Hyperlipidemia, unspecified: Secondary | ICD-10-CM | POA: Diagnosis not present

## 2021-07-12 DIAGNOSIS — F418 Other specified anxiety disorders: Secondary | ICD-10-CM

## 2021-07-12 LAB — POCT GLYCOSYLATED HEMOGLOBIN (HGB A1C): Hemoglobin A1C: 6 % — AB (ref 4.0–5.6)

## 2021-07-12 LAB — GLUCOSE, CAPILLARY: Glucose-Capillary: 112 mg/dL — ABNORMAL HIGH (ref 70–99)

## 2021-07-12 MED ORDER — METHOCARBAMOL 500 MG PO TABS
1000.0000 mg | ORAL_TABLET | Freq: Three times a day (TID) | ORAL | 0 refills | Status: DC | PRN
  Filled 2021-07-12: qty 30, 5d supply, fill #0

## 2021-07-12 NOTE — Assessment & Plan Note (Signed)
Patient denies muscle aches or GI upset since increasing Lipitor to 40 mg approximately one month ago. - Continue Lipitor 40 mg.

## 2021-07-12 NOTE — Assessment & Plan Note (Signed)
GFR 05/2021 of 34. BMP rechecked today to evaluate kidney function with recent increase in lisinopril from 10 mg to 20 mg.

## 2021-07-12 NOTE — Progress Notes (Signed)
   CC: BP check  HPI:  [name] is a 50 y.o. female with past medical history listed below who presents today for a 1 month recheck of her BP and BMP after increasing lisinopril to 20 mg. She states that she feels okay today and takes her medication every day, as prescribed.   Please see problem based charting for assessment and plan.  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  Respiratory:  Negative for shortness of breath.   Cardiovascular:  Negative for chest pain, palpitations and leg swelling.  Gastrointestinal:  Negative for abdominal pain, constipation and diarrhea.  Genitourinary:  Negative for frequency and urgency.  Musculoskeletal:  Positive for myalgias.  Neurological:  Negative for weakness and headaches.  Psychiatric/Behavioral:  Positive for depression. Negative for substance abuse. The patient is nervous/anxious.     Physical Exam:  Vitals:   07/12/21 1104 07/12/21 1113 07/12/21 1144  BP: (!) 159/96 (!) 146/77 (!) 159/95  Pulse: 86 81 79  Temp: 98.7 F (37.1 C)    TempSrc: Oral    SpO2: 100%    Weight: 250 lb 4.8 oz (113.5 kg)    Height: 5\' 5"  (1.651 m)     Physical Exam Vitals and nursing note reviewed.  Constitutional:      Appearance: She is obese.  Eyes:     Comments: R eye laterally deviates  Cardiovascular:     Rate and Rhythm: Normal rate and regular rhythm.     Pulses:          Radial pulses are 2+ on the right side and 2+ on the left side.       Posterior tibial pulses are 2+ on  the right side and 2+ on the left side.     Heart sounds: Normal heart sounds.  Pulmonary:     Effort: Pulmonary effort is normal.     Breath sounds: Normal breath sounds and air entry.  Abdominal:     General: There is no distension.     Palpations: Abdomen is soft.     Tenderness: There is no abdominal tenderness.  Musculoskeletal:     Right lower leg: No edema.     Left lower leg: No edema.  Skin:    General: Skin is warm and dry.  Neurological:     General: No focal deficit present.     Mental Status: She is alert and oriented to person, place, and time.  Psychiatric:        Attention and Perception: Attention normal.        Mood and Affect: Mood is anxious and depressed. Affect is tearful.     Assessment & Plan:   See Encounters Tab for problem based charting.  Patient seen with Dr. 

## 2021-07-12 NOTE — Assessment & Plan Note (Signed)
Patient continues to endorse muscle cramps. At Kendra Vega last visit 05/2021 she was advised to use mustard, which she states helps alleviate Kendra Vega cramps, for treatment when she does experience cramps however she has also continued to take methocarbamol twice daily rather than as needed for cramps not relieved by mustard. - Discussed methocarbamol use with patient, ensuring that she understands it is not scheduled for twice per day but is instead intended for when she is unable to get relief with other treatment modalities that she has noted to provide relief such as mustard. - Refill methocarbamol

## 2021-07-12 NOTE — Assessment & Plan Note (Addendum)
Ms. Klinedinst continues to have hypertension despite being on 4 separate classes of medications that target BP. She does appear to have been controlled in the past with spironolactone but was hyperkalemic and this medication was stopped. She is currently taking amlodipine 10 mg daily, lisinopril 20 mg daily, lasix 80 mg daily, and labetalol 200 mg twice daily. Lisinopril was increased from 10 mg to 20 mg daily at last visit approximately one month ago.  - Recheck BMP - If kidney function is stable, will increase lisinopril to 40 mg daily - Will initiate further work-up by checking renin-aldosterone levels. Of note, PRA>1 does not exclude hyperaldosteronism. - F/u in one month for BP, BMP check.  Addendum 07/13/2021: BMP mostly unchanged--GFR 38 (34 05/2021), Cr 1.62 (1.79 05/2021). Will have her start lisinopril 40 mg daily.  Addendum 07/17/2021: Aldosterone 5.9, renin 5.734, ratio 1.0. Can consider broadening work-up at next visit--metanephrines, renal artery ultrasound.

## 2021-07-12 NOTE — Assessment & Plan Note (Addendum)
Patient states that she is working on setting up neurology appointment.  - Continue Plavix 75 mg daily - Continue aspirin 81 mg daily - Continue HTN and HLD management.

## 2021-07-12 NOTE — Assessment & Plan Note (Signed)
HbA1c essentially unchanged from January 2022 level of 5.9%; is 6.0% today.

## 2021-07-12 NOTE — Patient Instructions (Addendum)
Thank you for visiting the [facility] today. It was a pleasure to meet you! Today we discussed your blood pressure and concerns regarding your anxiety and PTSD.   I have ordered the following for you:  Lab orders: BMP: to check your kidneys Renin-aldosterone: to check if there is an imbalance that could be causing your blood pressure to be elevated  Referrals: Behavioral health  Follow-up: 1 month for BP and BMP recheck.  Remember: If you have any questions or concerns, please call our clinic at [phone] between 9am-5pm and after hours call [phone] and ask for the internal medicine resident on call. If you feel you are having a medical emergency please call 911.  Champ Mungo, DO

## 2021-07-12 NOTE — Assessment & Plan Note (Addendum)
Patient endorses continued depression and anxiety with PTSD that resulted from finding her son dead two years ago this 07/05/23. She does not feel that she is receiving adequate behavioral health support from her current behavioral health providers. She states that she recently took a friend's Xanax when having a very severe anxiety day and that it helped her calm down significantly. - Referral to [facility] behavioral health - Continue Buspar 10 mg twice per day - Continue Seroquel 300 mg daily - Continue hydroxyzine 25 mg three times per day - Continue Prozac 40 mg daily

## 2021-07-13 ENCOUNTER — Telehealth: Payer: Self-pay | Admitting: Internal Medicine

## 2021-07-13 ENCOUNTER — Other Ambulatory Visit (HOSPITAL_COMMUNITY): Payer: Self-pay

## 2021-07-13 MED ORDER — LISINOPRIL 20 MG PO TABS
40.0000 mg | ORAL_TABLET | Freq: Every day | ORAL | 1 refills | Status: DC
Start: 2021-07-13 — End: 2021-09-07
  Filled 2021-07-13 – 2021-08-07 (×2): qty 60, 30d supply, fill #0

## 2021-07-13 NOTE — Telephone Encounter (Signed)
Spoke with Kendra Vega to let her know that her kidney function labs collected yesterday came back okay and that I will send a new prescription in for lisinopril 40 mg as we discussed during the visit. When the remaining labs result I will update her as well.

## 2021-07-13 NOTE — Progress Notes (Signed)
[facility] Medicine Clinic Attending  I saw and evaluated the patient.  I personally confirmed the key portions of the history and exam documented by Dr.  Dean  and I reviewed pertinent patient test results.  The assessment, diagnosis, and plan were formulated together and I agree with the documentation in the resident's note.  

## 2021-07-17 LAB — BMP8+ANION GAP
Anion Gap: 17 mmol/L (ref 10.0–18.0)
BUN/Creatinine Ratio: 12 (ref 9–23)
BUN: 19 mg/dL (ref 6–24)
CO2: 22 mmol/L (ref 20–29)
Calcium: 9.7 mg/dL (ref 8.7–10.2)
Chloride: 99 mmol/L (ref 96–106)
Creatinine, Ser: 1.62 mg/dL — ABNORMAL HIGH (ref 0.57–1.00)
Glucose: 100 mg/dL — ABNORMAL HIGH (ref 65–99)
Potassium: 4.2 mmol/L (ref 3.5–5.2)
Sodium: 138 mmol/L (ref 134–144)
eGFR: 38 mL/min/{1.73_m2} — ABNORMAL LOW (ref >59)

## 2021-07-17 LAB — ALDOSTERONE + RENIN ACTIVITY W/ RATIO
ALDOS/RENIN RATIO: 1 (ref 0.0–30.0)
ALDOSTERONE: 5.9 ng/dL (ref 0.0–30.0)
Renin: 5.734 ng/mL/hr — ABNORMAL HIGH (ref 0.167–5.380)

## 2021-07-24 ENCOUNTER — Other Ambulatory Visit: Payer: Self-pay | Admitting: Internal Medicine

## 2021-07-24 ENCOUNTER — Other Ambulatory Visit (HOSPITAL_COMMUNITY): Payer: Self-pay

## 2021-07-24 MED FILL — Clopidogrel Bisulfate Tab 75 MG (Base Equiv): ORAL | 30 days supply | Qty: 30 | Fill #4 | Status: AC

## 2021-07-25 ENCOUNTER — Other Ambulatory Visit: Payer: Self-pay | Admitting: Internal Medicine

## 2021-07-25 ENCOUNTER — Other Ambulatory Visit (HOSPITAL_COMMUNITY): Payer: Self-pay

## 2021-07-25 DIAGNOSIS — F4310 Post-traumatic stress disorder, unspecified: Secondary | ICD-10-CM | POA: Diagnosis not present

## 2021-07-25 MED ORDER — ATORVASTATIN CALCIUM 40 MG PO TABS
40.0000 mg | ORAL_TABLET | Freq: Every day | ORAL | 0 refills | Status: DC
  Filled 2021-07-25: qty 90, 90d supply, fill #0

## 2021-07-25 NOTE — Progress Notes (Signed)
This AWV as documented by provider Dr. Kelley, PharmD, and attested by Dr. Basaraba is reviewed and approved.   

## 2021-07-25 NOTE — Telephone Encounter (Signed)
atorvastatin (LIPITOR) 40 MG tablet, REFILL REQUEST. Needs for 90 days supply.

## 2021-07-26 ENCOUNTER — Other Ambulatory Visit (HOSPITAL_COMMUNITY): Payer: Self-pay

## 2021-08-07 ENCOUNTER — Other Ambulatory Visit (HOSPITAL_COMMUNITY): Payer: Self-pay

## 2021-08-07 ENCOUNTER — Other Ambulatory Visit: Payer: Self-pay | Admitting: Internal Medicine

## 2021-08-07 MED ORDER — LABETALOL HCL 200 MG PO TABS
200.0000 mg | ORAL_TABLET | Freq: Two times a day (BID) | ORAL | 2 refills | Status: DC
Start: 2021-08-07 — End: 2021-09-07
  Filled 2021-08-07: qty 60, 30d supply, fill #0

## 2021-08-07 NOTE — Telephone Encounter (Signed)
Please arrange a blood pressure follow up visit sometime this month. Thanks you

## 2021-08-08 ENCOUNTER — Ambulatory Visit: Payer: Medicare HMO | Admitting: Gastroenterology

## 2021-08-08 ENCOUNTER — Other Ambulatory Visit (HOSPITAL_COMMUNITY): Payer: Self-pay

## 2021-08-15 DIAGNOSIS — F4310 Post-traumatic stress disorder, unspecified: Secondary | ICD-10-CM | POA: Diagnosis not present

## 2021-08-23 ENCOUNTER — Other Ambulatory Visit: Payer: Self-pay

## 2021-08-24 ENCOUNTER — Other Ambulatory Visit (HOSPITAL_COMMUNITY): Payer: Self-pay

## 2021-08-24 ENCOUNTER — Other Ambulatory Visit: Payer: Self-pay | Admitting: Internal Medicine

## 2021-08-24 DIAGNOSIS — R252 Cramp and spasm: Secondary | ICD-10-CM

## 2021-08-28 ENCOUNTER — Other Ambulatory Visit (HOSPITAL_COMMUNITY): Payer: Self-pay

## 2021-08-29 ENCOUNTER — Other Ambulatory Visit (HOSPITAL_COMMUNITY): Payer: Self-pay

## 2021-08-29 MED ORDER — FUROSEMIDE 80 MG PO TABS
80.0000 mg | ORAL_TABLET | Freq: Every day | ORAL | 1 refills | Status: DC
  Filled 2021-08-29: qty 90, 90d supply, fill #0

## 2021-08-29 MED ORDER — CLOPIDOGREL BISULFATE 75 MG PO TABS
75.0000 mg | ORAL_TABLET | Freq: Every day | ORAL | 6 refills | Status: DC
  Filled 2021-08-29: qty 30, 30d supply, fill #0

## 2021-08-29 MED ORDER — METHOCARBAMOL 500 MG PO TABS
1000.0000 mg | ORAL_TABLET | Freq: Three times a day (TID) | ORAL | 0 refills | Status: DC | PRN
  Filled 2021-08-29: qty 30, 5d supply, fill #0

## 2021-08-29 NOTE — Telephone Encounter (Signed)
Call from pt stating the pharmacy has been calling for pt's refills since last Friday. Requesting refills on Plavix, Lasix and Robaxin.

## 2021-08-30 ENCOUNTER — Other Ambulatory Visit (HOSPITAL_COMMUNITY): Payer: Self-pay

## 2021-09-06 ENCOUNTER — Other Ambulatory Visit (HOSPITAL_COMMUNITY): Payer: Self-pay

## 2021-09-06 ENCOUNTER — Ambulatory Visit (INDEPENDENT_AMBULATORY_CARE_PROVIDER_SITE_OTHER): Payer: Medicare HMO | Admitting: Gastroenterology

## 2021-09-06 ENCOUNTER — Telehealth: Payer: Self-pay

## 2021-09-06 ENCOUNTER — Encounter: Payer: Self-pay | Admitting: Gastroenterology

## 2021-09-06 VITALS — BP 160/94 | HR 82 | Ht 65.0 in | Wt 249.0 lb

## 2021-09-06 DIAGNOSIS — Z1211 Encounter for screening for malignant neoplasm of colon: Secondary | ICD-10-CM | POA: Diagnosis not present

## 2021-09-06 DIAGNOSIS — K219 Gastro-esophageal reflux disease without esophagitis: Secondary | ICD-10-CM

## 2021-09-06 DIAGNOSIS — Z7902 Long term (current) use of antithrombotics/antiplatelets: Secondary | ICD-10-CM | POA: Diagnosis not present

## 2021-09-06 DIAGNOSIS — Z8673 Personal history of transient ischemic attack (TIA), and cerebral infarction without residual deficits: Secondary | ICD-10-CM | POA: Diagnosis not present

## 2021-09-06 MED ORDER — PEG 3350-KCL-NABCB-NACL-NASULF 236 G PO SOLR
4000.0000 mL | Freq: Once | ORAL | 0 refills | Status: DC
  Filled 2021-09-06: qty 4000, 1d supply, fill #0

## 2021-09-06 NOTE — Patient Instructions (Addendum)
If you are age 51 or older, your body mass index should be between 23-30. Your Body mass index is 41.44 kg/m. If this is out of the aforementioned range listed, please consider follow up with your Primary Care Provider.  If you are age 30 or younger, your body mass index should be between 19-25. Your Body mass index is 41.44 kg/m. If this is out of the aformentioned range listed, please consider follow up with your Primary Care Provider.   __________________________________________________________  The [facility] providers would like to encourage you to use [facility] to communicate with providers for non-urgent requests or questions.  Due to long hold times on the telephone, sending your provider a message by [facility] may be a faster and more efficient way to get a response.  Please allow 48 business hours for a response.  Please remember that this is for non-urgent requests.   If you are age 29 or older, your body mass index should be between 23-30. Your Body mass index is 41.44 kg/m. If this is out of the aforementioned range listed, please consider follow up with your Primary Care Provider.  If you are age 69 or younger, your body mass index should be between 19-25. Your Body mass index is 41.44 kg/m. If this is out of the aformentioned range listed, please consider follow up with your Primary Care Provider.   __________________________________________________________  The [facility] providers would like to encourage you to use [facility] to communicate with providers for non-urgent requests or questions.  Due to long hold times on the telephone, sending your provider a message by [facility] may be a faster and more efficient way to get a response.  Please allow 48 business hours for a response.  Please remember that this is for non-urgent requests.   Due to recent changes in healthcare laws, you may see the results of your imaging and laboratory studies on MyChart before your provider has had a chance  to review them.  We understand that in some cases there may be results that are confusing or concerning to you. Not all laboratory results come back in the same time frame and the provider may be waiting for multiple results in order to interpret others.  Please give [place] 48 hours in order for your provider to thoroughly review all the results before contacting the office for clarification of your results.   It was a pleasure to see you today!  Thank you for trusting me with your gastrointestinal care!

## 2021-09-06 NOTE — Telephone Encounter (Signed)
Dr. Farrel Gordon,  Please review the request below for clearance to hold the patients blood thinner.     Request for surgical clearance:     Endoscopy Procedure ( colonoscopy)  What type of surgery is being performed?     Colonoscopy   When is this surgery scheduled?     10-13-2021  What type of clearance is required ?   5 days to hold the Plavix prior to the scheduled procedure  Practice name and name of physician performing surgery?      [facility]  What is your office phone and fax number?      Phone- [phone]  [phone]  Anesthesia type (None, local, MAC, general) ?       MAC   Thank you,   Vivien Rota, cma

## 2021-09-06 NOTE — Progress Notes (Signed)
[facility] Consult Note:  History: Kendra Vega 09/06/2021  Referring provider: Dickie La, MD  Reason for consult/chief complaint: Discuss colonoscopy (No GI complaints, Patient is on Plavix)   Subjective  HPI:  This was referred to [place] by primary care for consideration of colon cancer screening.  She has no family history of colorectal cancer to her knowledge, and has had no prior screening test done.  She tends toward constipation that she attributes to her blood pressure medicine, denies rectal bleeding.  Denies dysphagia, odynophagia, nausea, vomiting or early satiety.  She has intermittent pyrosis or regurgitation under good control on acid suppression for about the last 2 years by her recollection.  According to 07/12/21 primary care note (internal medicine resident clinic), patient had a distant CVA and is maintained on Plavix.  Has not been seen by neurology in years and was planning to set up an appointment.  ROS:  Review of Systems  Constitutional:  Negative for appetite change and unexpected weight change.  HENT:  Negative for mouth sores and voice change.   Eyes:  Negative for pain and redness.       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   Cardiovascular:  Negative for chest pain and palpitations.  Genitourinary:  Negative for dysuria and hematuria.  Musculoskeletal:  Negative for arthralgias and myalgias.  Skin:  Negative for pallor and rash.  Neurological:  Negative for weakness and headaches.  Hematological:  Negative for adenopathy.    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  Past Surgical History: Past Surgical History:  Procedure Laterality Date   BREAST CYST ASPIRATION Right 08/06/2017   breast abcess aspirated   MULTIPLE TOOTH EXTRACTIONS     TOOTH EXTRACTION N/A 12/11/2019   Procedure: EXTRACTION MOLARS;  Surgeon: Ocie Doyne, DDS;  Location: [facility] OR;  Service: Oral Surgery;  Laterality: N/A;     Family History: Family History  Problem Relation Age of Onset   Kidney disease Mother    Diabetes Mother    Hypertension Mother    Hypertension Sister    Hypertension Sister    Hypertension Sister    Heart attack Son     Social History: Social History   Socioeconomic History   Marital status: Significant Other    Spouse name: Not on file   Number of children: 1   Years of education: Not on file   Highest education level: 10th grade  Occupational History   Not on file  Tobacco Use   Smoking status: Every Day    Packs/day: 0.20    Years: 7.00    Pack years: 1.40    Types: Cigarettes   Smokeless tobacco: Never   Tobacco comments:    smokes 2-3 daily " Black and Milds "   Vaping Use   Vaping Use: Never used  Substance and Sexual Activity   Alcohol use: Not  Currently    Comment: rare glass of wine   Drug use: Not Currently    Frequency: 1.0 times per week    Types: Marijuana    Comment: occasional marijuana    Sexual activity: Not Currently    Partners: Male    Birth control/protection: None  Other Topics Concern   Not on file  Social History Narrative   Current Social History 06/21/2021        Patient lives with a significant other in an apartment which is 1 story/stories. There are not steps up to the entrance the patient uses.       Patient's method of transportation is via family member or public transportation if needed.      The highest level of education was some high school.      The patient  currently disabled but would like part time job.       Identified important Relationships are "my dogs"      Pets : 2       Interests / Fun: "I love to shop"       Current Stressors: son's passing and her bills      Religious / Personal Beliefs: "I just believe in Jesus Christ"        Social Determinants of Health   Financial Resource Strain: Not on file  Food Insecurity: Not on file  Transportation Needs: Not on file  Physical Activity: Not on file  Stress: Not on file  Social Connections: Not on file    Allergies: No Known Allergies  Outpatient Meds: Current Outpatient Medications  Medication Sig Dispense Refill   acetaminophen (TYLENOL) 500 MG tablet Take 1,000 mg by mouth every 6 (six) hours as needed for moderate pain.      amLODipine (NORVASC) 10 MG tablet Take 1 tablet (10 mg total) by mouth daily. 90 tablet 1   aspirin EC 81 MG tablet Take 81 mg by mouth daily. Swallow whole.     atorvastatin (LIPITOR) 40 MG tablet Take 1 tablet (40 mg total) by mouth daily. 90 tablet 0   Blood Glucose Monitoring Suppl (TRUE METRIX METER) DEVI 1 each by Does not apply route daily. 1 Device 0   busPIRone (BUSPAR) 10 MG tablet Take 10 mg by mouth 2 (two) times daily.     clopidogrel (PLAVIX) 75 MG tablet Take 1 tablet (75 mg total) by mouth daily. 30 tablet 6   ELDERBERRY PO Take by mouth.     esomeprazole (NEXIUM) 20 MG capsule Take 1 capsule (20 mg total) by mouth daily. 90 capsule 1   ferrous sulfate 325 (65 FE) MG EC tablet Take 1 tablet (325 mg total) by mouth daily. 30 tablet 3   FLUoxetine (PROZAC) 40 MG capsule Take 40 mg by mouth daily.     furosemide (LASIX) 80 MG tablet Take 1 tablet (80 mg total) by mouth daily. 90 tablet 1   glucose blood (TRUE METRIX BLOOD GLUCOSE TEST) test strip Use once daily 100 each 12   hydrOXYzine (ATARAX/VISTARIL) 25 MG tablet Take 1 tablet (25 mg total) by mouth 3 (three) times daily. 90 tablet 6   labetalol (NORMODYNE) 200 MG tablet Take 1  tablet (200 mg total) by mouth 2 (two) times daily. 60 tablet 2   lisinopril (ZESTRIL) 20 MG tablet Take 2 tablets (40 mg total) by mouth daily. 60 tablet 1   methocarbamol (ROBAXIN) 500 MG tablet Take 2 tablets (1,000 mg total) by mouth every 8 (eight) hours as  needed for muscle spasms. 30 tablet 0   Omega-3 Fatty Acids (FISH OIL) 1000 MG CAPS Take 1,000 mg by mouth daily.     polyethylene glycol (GOLYTELY) 236 g solution Take 4,000 mLs by mouth once for 1 dose. 4000 mL 0   QUEtiapine (SEROQUEL XR) 300 MG 24 hr tablet Take 300 mg by mouth at bedtime.     TRUEPLUS LANCETS 28G MISC 1 each by Does not apply route daily. 100 each 12   No current facility-administered medications for this visit.      ___________________________________________________________________ Objective   Exam:  BP (!) 160/94   Pulse 82   Ht 5\' 5"  (1.651 m)   Wt 249 lb (112.9 kg)   SpO2 100%   BMI 41.44 kg/m  Wt Readings from Last 3 Encounters:  09/06/21 249 lb (112.9 kg)  07/12/21 250 lb 4.8 oz (113.5 kg)  06/14/21 247 lb 4.8 oz (112.2 kg)    General: Ambulatory, well-appearing, pleasant and conversational Eyes: sclera anicteric, no redness ENT: oral mucosa moist without lesions, no cervical or supraclavicular lymphadenopathy CV: RRR without murmur, S1/S2, no JVD, no peripheral edema Resp: clear to auscultation bilaterally, normal RR and effort noted GI: soft, obese, no tenderness, with active bowel sounds. No guarding or palpable organomegaly noted. Skin; warm and dry, no rash or jaundice noted Neuro: awake, alert and oriented x 3. Normal gross motor function and fluent speech  Labs:  CBC Latest Ref Rng & Units 06/14/2021 03/09/2020 09/05/2018  WBC 3.4 - 10.8 x10E3/uL 10.0 12.6(H) 11.4(H)  Hemoglobin 11.1 - 15.9 g/dL 11/05/2018 15.4 00.8  Hematocrit 34.0 - 46.6 % 38.2 36.0 35.7  Platelets 150 - 450 x10E3/uL 347 452(H) 397   CMP Latest Ref Rng & Units 07/12/2021 06/14/2021 06/23/2020  Glucose 65 - 99 mg/dL  06/25/2020) 92 90  BUN 6 - 24 mg/dL 19 23 18   Creatinine 0.57 - 1.00 mg/dL 195(K) ) 9.32(I)  Sodium 134 - 144 mmol/L 138 140 137  Potassium 3.5 - 5.2 mmol/L 4.2 4.3 4.4  Chloride 96 - 106 mmol/L 99 102 100  CO2 20 - 29 mmol/L 22 23 20   Calcium 8.7 - 10.2 mg/dL 9.7 9.7 9.8  Total Protein 6.0 - 8.5 g/dL - - -  Total Bilirubin 0.0 - 1.2 mg/dL - - -  Alkaline Phos 39 - 117 IU/L - - -  AST 0 - 40 IU/L - - -  ALT 0 - 32 IU/L - - -    Assessment: Encounter Diagnoses  Name Primary?   Special screening for malignant neoplasms, colon Yes   History of CVA (cerebrovascular accident)    Long term (current) use of antithrombotics/antiplatelets    Gastroesophageal reflux disease, unspecified whether esophagitis present     Average risk colorectal cancer, recommended screening colonoscopy.  She would need to be off Plavix 5 days prior, and we will message her PCP for their opinion on that.  If in fact her last CVA was years ago, the risk of such a Plavix hold would appear to be relatively low. She was agreeable to a colonoscopy after a thorough discussion of the procedure and risks  The benefits and risks of the planned procedure were described in detail with the patient or (when appropriate) their health care proxy.  Risks were outlined as including, but not limited to, bleeding, infection, perforation, adverse medication reaction leading to cardiac or pulmonary decompensation, pancreatitis (if ERCP).  The limitation of incomplete mucosal visualization was also discussed.  No guarantees or  warranties were given.  Patient at increased risk for cardiopulmonary complications of procedure due to medical comorbidities.  Stable GERD symptoms without red flag signs on acid suppression therapy.  Antireflux diet and lifestyle measures reviewed, especially the need to stop smoking and attempt weight loss.   Thank you for the courtesy of this consult.  Please call me with any questions or concerns.  Charlie Pitter III  CC: Referring provider noted above

## 2021-09-07 ENCOUNTER — Encounter: Payer: Self-pay | Admitting: Internal Medicine

## 2021-09-07 ENCOUNTER — Other Ambulatory Visit: Payer: Self-pay

## 2021-09-07 ENCOUNTER — Ambulatory Visit (INDEPENDENT_AMBULATORY_CARE_PROVIDER_SITE_OTHER): Payer: Medicare HMO | Admitting: Internal Medicine

## 2021-09-07 ENCOUNTER — Other Ambulatory Visit (HOSPITAL_COMMUNITY): Payer: Self-pay

## 2021-09-07 VITALS — BP 146/85 | HR 81 | Temp 98.5°F | Resp 24 | Ht 65.0 in | Wt 254.8 lb

## 2021-09-07 DIAGNOSIS — Z8673 Personal history of transient ischemic attack (TIA), and cerebral infarction without residual deficits: Secondary | ICD-10-CM

## 2021-09-07 DIAGNOSIS — Z0000 Encounter for general adult medical examination without abnormal findings: Secondary | ICD-10-CM

## 2021-09-07 DIAGNOSIS — F319 Bipolar disorder, unspecified: Secondary | ICD-10-CM | POA: Diagnosis not present

## 2021-09-07 DIAGNOSIS — R252 Cramp and spasm: Secondary | ICD-10-CM

## 2021-09-07 DIAGNOSIS — F17200 Nicotine dependence, unspecified, uncomplicated: Secondary | ICD-10-CM | POA: Diagnosis not present

## 2021-09-07 DIAGNOSIS — K219 Gastro-esophageal reflux disease without esophagitis: Secondary | ICD-10-CM

## 2021-09-07 DIAGNOSIS — I10 Essential (primary) hypertension: Secondary | ICD-10-CM | POA: Diagnosis not present

## 2021-09-07 DIAGNOSIS — N1832 Chronic kidney disease, stage 3b: Secondary | ICD-10-CM | POA: Diagnosis not present

## 2021-09-07 DIAGNOSIS — E119 Type 2 diabetes mellitus without complications: Secondary | ICD-10-CM

## 2021-09-07 DIAGNOSIS — E785 Hyperlipidemia, unspecified: Secondary | ICD-10-CM | POA: Diagnosis not present

## 2021-09-07 MED ORDER — ATORVASTATIN CALCIUM 40 MG PO TABS
40.0000 mg | ORAL_TABLET | Freq: Every day | ORAL | 0 refills | Status: DC
  Filled 2021-09-07 – 2021-10-24 (×2): qty 90, 90d supply, fill #0

## 2021-09-07 MED ORDER — CLOPIDOGREL BISULFATE 75 MG PO TABS
75.0000 mg | ORAL_TABLET | Freq: Every day | ORAL | 2 refills | Status: DC
  Filled 2021-09-07 – 2021-09-27 (×2): qty 90, 90d supply, fill #0
  Filled 2021-12-28: qty 90, 90d supply, fill #1
  Filled 2022-03-23: qty 90, 90d supply, fill #2

## 2021-09-07 MED ORDER — ESOMEPRAZOLE MAGNESIUM 20 MG PO CPDR
20.0000 mg | DELAYED_RELEASE_CAPSULE | Freq: Every day | ORAL | 1 refills | Status: DC
  Filled 2021-09-07 – 2021-09-21 (×2): qty 90, 90d supply, fill #0
  Filled 2021-12-07: qty 90, 90d supply, fill #1

## 2021-09-07 MED ORDER — FLUOXETINE HCL 40 MG PO CAPS
40.0000 mg | ORAL_CAPSULE | Freq: Every day | ORAL | 2 refills | Status: AC
  Filled 2021-09-07: qty 90, 90d supply, fill #0

## 2021-09-07 MED ORDER — LABETALOL HCL 200 MG PO TABS
200.0000 mg | ORAL_TABLET | Freq: Two times a day (BID) | ORAL | 2 refills | Status: DC
Start: 2021-09-07 — End: 2022-03-01
  Filled 2021-09-07: qty 60, 30d supply, fill #0
  Filled 2021-11-16: qty 60, 30d supply, fill #1
  Filled 2022-01-04: qty 60, 30d supply, fill #2

## 2021-09-07 MED ORDER — HYDRALAZINE HCL 25 MG PO TABS
25.0000 mg | ORAL_TABLET | Freq: Three times a day (TID) | ORAL | 2 refills | Status: DC
  Filled 2021-09-07: qty 90, 30d supply, fill #0

## 2021-09-07 MED ORDER — FUROSEMIDE 80 MG PO TABS
80.0000 mg | ORAL_TABLET | Freq: Every day | ORAL | 1 refills | Status: DC
  Filled 2021-09-07 – 2021-12-07 (×2): qty 90, 90d supply, fill #0
  Filled 2022-03-01: qty 90, 90d supply, fill #1

## 2021-09-07 MED ORDER — VARENICLINE TARTRATE 1 MG PO TABS
1.0000 mg | ORAL_TABLET | Freq: Two times a day (BID) | ORAL | 2 refills | Status: DC
Start: 2021-09-07 — End: 2022-04-02
  Filled 2021-09-07: qty 60, 30d supply, fill #0

## 2021-09-07 MED ORDER — METHOCARBAMOL 500 MG PO TABS
1000.0000 mg | ORAL_TABLET | Freq: Three times a day (TID) | ORAL | 0 refills | Status: DC | PRN
  Filled 2021-09-07: qty 30, 5d supply, fill #0

## 2021-09-07 MED ORDER — HYDROXYZINE HCL 25 MG PO TABS
25.0000 mg | ORAL_TABLET | Freq: Three times a day (TID) | ORAL | 6 refills | Status: DC
  Filled 2021-09-07: qty 90, 30d supply, fill #0

## 2021-09-07 MED ORDER — VARENICLINE TARTRATE 0.5 MG PO TABS
ORAL_TABLET | ORAL | 0 refills | Status: DC
  Filled 2021-09-07: qty 53, 21d supply, fill #0
  Filled 2021-09-07: qty 53, fill #0

## 2021-09-07 MED ORDER — AMLODIPINE BESYLATE 10 MG PO TABS
10.0000 mg | ORAL_TABLET | Freq: Every day | ORAL | 1 refills | Status: DC
Start: 2021-09-07 — End: 2022-04-25
  Filled 2021-09-07 – 2021-10-24 (×2): qty 90, 90d supply, fill #0
  Filled 2022-01-24: qty 90, 90d supply, fill #1

## 2021-09-07 MED ORDER — LISINOPRIL 20 MG PO TABS
40.0000 mg | ORAL_TABLET | Freq: Every day | ORAL | 1 refills | Status: DC
  Filled 2021-09-07: qty 60, 30d supply, fill #0
  Filled 2021-11-16: qty 60, 30d supply, fill #1

## 2021-09-07 NOTE — Assessment & Plan Note (Signed)
Continue esomeprazole 20 mg daily.

## 2021-09-07 NOTE — Assessment & Plan Note (Signed)
>>  ASSESSMENT AND PLAN FOR BIPOLAR DEPRESSION ([place]) WRITTEN ON 09/07/2021  1:42 PM BY DEMAIO, ALEXA, MD  Patient expresses significant grief over the loss of her son two years ago. Continues to follow with [facility]. Denies any HI/SI. Patient reports no longer taking buspirone. Counseling provided. - continue seroquel 300 nightly, fluoxetine 40, and hydroxyzine 25 TID

## 2021-09-07 NOTE — Assessment & Plan Note (Signed)
Discussed need for a flu shot today but patient declined, thinks that her prior strokes were related to getting the flu shot. Also discussed need for a pap smear. Will get pap smear at next visit. - pap smear next visit. - confirm patient able to get colonoscopy

## 2021-09-07 NOTE — Assessment & Plan Note (Signed)
Patient expresses significant grief over the loss of her son two years ago. Continues to follow with [facility]. Denies any HI/SI. Patient reports no longer taking buspirone. Counseling provided. - continue seroquel 300 nightly, fluoxetine 40, and hydroxyzine 25 TID

## 2021-09-07 NOTE — Assessment & Plan Note (Addendum)
Patient reports smoking cigars. She is motivated to quit but says that she did not like the patch when she tried taking it. Patient was nervous to take buproprion as she had a family member who had a seizure. - smoking cessation counseling provided - start chantix

## 2021-09-07 NOTE — Assessment & Plan Note (Signed)
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 Continue to follow with [facility]. - f/u renal artery ultrasound as indicated under hypertension - avoid nephrotoxic medications

## 2021-09-07 NOTE — Assessment & Plan Note (Signed)
Patient reports an acute on chronic exacerbation of her back pain. She says that she has been taking tylenol and the methocarbamol for this which have been helping. She also uses heating pads which have helped a lot. Encouraged patient to continue with current management and suspect that back will continue to improve over the coming weeks. - continue methocarbamol for breakthrough pain/muscle cramps - continue heating pads and tylenol

## 2021-09-07 NOTE — Progress Notes (Signed)
   CC: back pain  HPI:  [name] is a 50 y.o. PMH noted below, who presents to the [facility] with complaints of back pain. To see the management of his acute and chronic conditions, please refer to the A&P note under the encounters tab.   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   Breast mass    rt breast ?abcess x's 1 week   Chronic kidney disease    Dental caries    Depression    GERD (gastroesophageal reflux disease)    Hypertension    Stroke (HCC)    Type 2 diabetes mellitus without complication, without long-term current use of insulin (HCC) 10/21/2018   Review of Systems:  positive for anxiety and depression, back pain  Physical Exam: Gen: obese appearing woman sitting in a chair in NAD HEENT: normocephalic atraumatic, MMM, neck supple CV: RRR, no m/r/g   Resp: CTAB, normal WOB  GI: soft, nontender, distended MSK: moves all extremities without difficulty, normal gait Skin:warm and dry Neuro:alert answering questions appropriately Psych: tearful affect   Assessment & Plan:   See Encounters Tab for problem based charting.  Patient seen with Dr. Antony Contras

## 2021-09-07 NOTE — Assessment & Plan Note (Signed)
Continue atorvastatin 40.

## 2021-09-07 NOTE — Assessment & Plan Note (Signed)
Patient requested a follow up with neurology, says that old referral was for a place too far away. - new referral placed - continue aspirin plavix

## 2021-09-07 NOTE — Assessment & Plan Note (Addendum)
Patient with persistently elevated blood pressures despite multiple agents with good medication adherence. Will continue with workup for secondary causes of hypertension, less likely to be pheochromocytoma given lack of episodic nature. Suspect sleep apnea and or renal artery stenosis could be contributing. (patient reports a history of RAS in her mother). Patient screens positive for OSA. Do not want to start clonidine as patient says that when it is time for a refill on her medications she may run out for a day or so before she is able to pick up the medications. However do suspect patient is adherent with her medications outside of this. - sleep study ordered - renal artery ultrasound ordered - continue amlodipine 10, lasix 80, lisinopril 20, and labetalol 200 - start hydralazine 25 TID today  Addendum: Renal artery stenosis noted, kidney doctor informed and pt referred to [facility]

## 2021-09-07 NOTE — Patient Instructions (Signed)
Gennesis Hogland  It was a pleasure seeing you in the clinic today.   We talked about your stress levels/grieving process, high blood pressure, and your back pain. I also placed a referral for you to get a neurology referral.  Back pain Continue taking the tylenol and the muscle relaxer for your pain. Keep using heating pads as well. I expect this will continue to improve over the next couple of weeks.  2. High blood pressure We placed a referral for you to have the sleep study to check for sleep apnea as well as an ultrasound to check for the blood flow into your kidneys.   3. Smoking I sent in a prescription for you to take chantix to help you stop smoking. We can discuss how quitting is going at your next visit.  4. Pap smear We will plan to do a pap smear at your next visit.   Please call our clinic at [phone] if you have any questions or concerns. The best time to call is Monday-Friday from 9am-4pm, but there is someone available 24/7 at the same number. If you need medication refills, please notify your pharmacy one week in advance and they will send [place] a request.   Thank you for letting us take part in your care. We look forward to seeing you next time!

## 2021-09-07 NOTE — Assessment & Plan Note (Signed)
Patient has been well controlled with an A1c <6.5 for several years. Patient was not aware of the fact that she was still considered to be diabetic since her sugars were controlled. Discussed that this is a chronic diagnosis but that she is doing well controlled it.  - continue diet control - provide ophthalmology referral at next visit

## 2021-09-08 ENCOUNTER — Other Ambulatory Visit (HOSPITAL_COMMUNITY): Payer: Self-pay

## 2021-09-08 LAB — BMP8+ANION GAP
Anion Gap: 18 mmol/L (ref 10.0–18.0)
BUN/Creatinine Ratio: 14 (ref 9–23)
BUN: 19 mg/dL (ref 6–24)
CO2: 20 mmol/L (ref 20–29)
Calcium: 9.7 mg/dL (ref 8.7–10.2)
Chloride: 102 mmol/L (ref 96–106)
Creatinine, Ser: 1.39 mg/dL — ABNORMAL HIGH (ref 0.57–1.00)
Glucose: 95 mg/dL (ref 70–99)
Potassium: 4.4 mmol/L (ref 3.5–5.2)
Sodium: 140 mmol/L (ref 134–144)
eGFR: 46 mL/min/{1.73_m2} — ABNORMAL LOW (ref >59)

## 2021-09-12 DIAGNOSIS — F4310 Post-traumatic stress disorder, unspecified: Secondary | ICD-10-CM | POA: Diagnosis not present

## 2021-09-12 NOTE — Progress Notes (Signed)
Internal Medicine Clinic Attending  I saw and evaluated the patient.  I personally confirmed the key portions of the history and exam documented by Dr. DeMaio   and I reviewed pertinent patient test results.  The assessment, diagnosis, and plan were formulated together and I agree with the documentation in the resident's note.  

## 2021-09-12 NOTE — Addendum Note (Signed)
Addended by: Burnell Blanks on: 09/12/2021 12:01 PM   Modules accepted: Level of Service

## 2021-09-14 ENCOUNTER — Ambulatory Visit (HOSPITAL_COMMUNITY)
Admission: RE | Admit: 2021-09-14 | Discharge: 2021-09-14 | Disposition: A | Discharge status: Home / Self Care | Payer: Medicare HMO | Source: Ambulatory Visit | Attending: Internal Medicine | Admitting: Internal Medicine

## 2021-09-14 ENCOUNTER — Other Ambulatory Visit: Payer: Self-pay | Admitting: Internal Medicine

## 2021-09-14 ENCOUNTER — Other Ambulatory Visit: Payer: Self-pay

## 2021-09-14 ENCOUNTER — Other Ambulatory Visit (HOSPITAL_COMMUNITY): Payer: Self-pay

## 2021-09-14 DIAGNOSIS — Z1231 Encounter for screening mammogram for malignant neoplasm of breast: Secondary | ICD-10-CM

## 2021-09-14 DIAGNOSIS — I10 Essential (primary) hypertension: Secondary | ICD-10-CM | POA: Insufficient documentation

## 2021-09-21 ENCOUNTER — Ambulatory Visit: Payer: Medicare HMO | Admitting: Podiatry

## 2021-09-22 ENCOUNTER — Other Ambulatory Visit (HOSPITAL_COMMUNITY): Payer: Self-pay

## 2021-09-25 ENCOUNTER — Other Ambulatory Visit (HOSPITAL_COMMUNITY): Payer: Self-pay

## 2021-09-27 NOTE — Telephone Encounter (Signed)
I am not sure why this was sent to me, I am not PCP. However PCP is on vacation so I did review the medical chart. Patient has a remote history of CVA in 2017 and takes plavix for secondary prevention. This is ok to hold 5 days prior to colonoscopy. Please let me know if you have any other questions. Thanks.

## 2021-09-27 NOTE — Telephone Encounter (Signed)
Left a detailed message for patient to return call

## 2021-09-28 ENCOUNTER — Other Ambulatory Visit (HOSPITAL_COMMUNITY): Payer: Self-pay

## 2021-10-04 NOTE — Telephone Encounter (Signed)
Left a detailed message to call ASAP.

## 2021-10-04 NOTE — Telephone Encounter (Signed)
Dr Myrtie Neither I have not been able to communicate with this patient. I have called her multiple times with no response from her. She is scheduled for 10-06-2021 for a colonoscopy.  Should we just cancel her scheduled procedure?

## 2021-10-04 NOTE — Telephone Encounter (Signed)
This patient's colonoscopy is scheduled for 10/13/2021, so there is still time to hear back from her and give these Plavix hold instructions.  HD

## 2021-10-06 ENCOUNTER — Encounter: Payer: Self-pay | Admitting: Gastroenterology

## 2021-10-09 DIAGNOSIS — I639 Cerebral infarction, unspecified: Secondary | ICD-10-CM | POA: Diagnosis not present

## 2021-10-09 DIAGNOSIS — I672 Cerebral atherosclerosis: Secondary | ICD-10-CM | POA: Diagnosis not present

## 2021-10-09 DIAGNOSIS — H3411 Central retinal artery occlusion, right eye: Secondary | ICD-10-CM | POA: Diagnosis not present

## 2021-10-09 NOTE — Telephone Encounter (Signed)
Left a message with instructions to hold the blood thinner starting today.

## 2021-10-11 NOTE — Telephone Encounter (Signed)
Patient has been contacted.She called and already cancelled her procedure, due to a death in the family. She will call back to schedule when she's ready.

## 2021-10-13 ENCOUNTER — Encounter: Payer: Medicare HMO | Admitting: Gastroenterology

## 2021-10-16 DIAGNOSIS — N2581 Secondary hyperparathyroidism of renal origin: Secondary | ICD-10-CM | POA: Diagnosis not present

## 2021-10-16 DIAGNOSIS — I129 Hypertensive chronic kidney disease with stage 1 through stage 4 chronic kidney disease, or unspecified chronic kidney disease: Secondary | ICD-10-CM | POA: Diagnosis not present

## 2021-10-16 DIAGNOSIS — N1832 Chronic kidney disease, stage 3b: Secondary | ICD-10-CM | POA: Diagnosis not present

## 2021-10-16 DIAGNOSIS — I639 Cerebral infarction, unspecified: Secondary | ICD-10-CM | POA: Diagnosis not present

## 2021-10-16 DIAGNOSIS — Z720 Tobacco use: Secondary | ICD-10-CM | POA: Diagnosis not present

## 2021-10-16 DIAGNOSIS — E785 Hyperlipidemia, unspecified: Secondary | ICD-10-CM | POA: Diagnosis not present

## 2021-10-16 DIAGNOSIS — D631 Anemia in chronic kidney disease: Secondary | ICD-10-CM | POA: Diagnosis not present

## 2021-10-16 DIAGNOSIS — E1122 Type 2 diabetes mellitus with diabetic chronic kidney disease: Secondary | ICD-10-CM | POA: Diagnosis not present

## 2021-10-18 ENCOUNTER — Ambulatory Visit
Admission: RE | Admit: 2021-10-18 | Discharge: 2021-10-18 | Disposition: A | Discharge status: Home / Self Care | Payer: Medicare HMO | Source: Ambulatory Visit | Attending: Internal Medicine | Admitting: Internal Medicine

## 2021-10-18 DIAGNOSIS — Z1231 Encounter for screening mammogram for malignant neoplasm of breast: Secondary | ICD-10-CM

## 2021-10-24 ENCOUNTER — Other Ambulatory Visit (HOSPITAL_COMMUNITY): Payer: Self-pay

## 2021-10-24 ENCOUNTER — Other Ambulatory Visit: Payer: Self-pay | Admitting: Internal Medicine

## 2021-10-24 DIAGNOSIS — F319 Bipolar disorder, unspecified: Secondary | ICD-10-CM | POA: Diagnosis not present

## 2021-10-24 DIAGNOSIS — R252 Cramp and spasm: Secondary | ICD-10-CM

## 2021-10-25 ENCOUNTER — Other Ambulatory Visit (HOSPITAL_COMMUNITY): Payer: Self-pay

## 2021-10-25 ENCOUNTER — Other Ambulatory Visit: Payer: Self-pay | Admitting: Internal Medicine

## 2021-10-25 DIAGNOSIS — R252 Cramp and spasm: Secondary | ICD-10-CM

## 2021-10-26 ENCOUNTER — Other Ambulatory Visit (HOSPITAL_COMMUNITY): Payer: Self-pay

## 2021-10-26 MED ORDER — METHOCARBAMOL 500 MG PO TABS
1000.0000 mg | ORAL_TABLET | Freq: Three times a day (TID) | ORAL | 0 refills | Status: DC | PRN
  Filled 2021-10-26: qty 30, 5d supply, fill #0

## 2021-10-27 ENCOUNTER — Other Ambulatory Visit (HOSPITAL_COMMUNITY): Payer: Self-pay

## 2021-11-16 ENCOUNTER — Other Ambulatory Visit (HOSPITAL_COMMUNITY): Payer: Self-pay

## 2021-11-16 ENCOUNTER — Other Ambulatory Visit: Payer: Self-pay | Admitting: Internal Medicine

## 2021-11-16 DIAGNOSIS — R252 Cramp and spasm: Secondary | ICD-10-CM

## 2021-11-17 ENCOUNTER — Other Ambulatory Visit (HOSPITAL_COMMUNITY): Payer: Self-pay

## 2021-11-22 ENCOUNTER — Other Ambulatory Visit: Payer: Self-pay | Admitting: Internal Medicine

## 2021-11-22 ENCOUNTER — Other Ambulatory Visit (HOSPITAL_COMMUNITY): Payer: Self-pay

## 2021-11-22 DIAGNOSIS — R252 Cramp and spasm: Secondary | ICD-10-CM

## 2021-11-22 MED ORDER — METHOCARBAMOL 500 MG PO TABS
1000.0000 mg | ORAL_TABLET | Freq: Three times a day (TID) | ORAL | 0 refills | Status: DC | PRN
  Filled 2021-11-22: qty 30, 5d supply, fill #0

## 2021-12-07 ENCOUNTER — Other Ambulatory Visit (HOSPITAL_COMMUNITY): Payer: Self-pay

## 2021-12-12 ENCOUNTER — Telehealth: Payer: Self-pay | Admitting: Internal Medicine

## 2021-12-28 ENCOUNTER — Other Ambulatory Visit: Payer: Self-pay | Admitting: Internal Medicine

## 2021-12-28 DIAGNOSIS — R252 Cramp and spasm: Secondary | ICD-10-CM

## 2021-12-29 ENCOUNTER — Other Ambulatory Visit (HOSPITAL_COMMUNITY): Payer: Self-pay

## 2021-12-29 MED ORDER — METHOCARBAMOL 500 MG PO TABS
1000.0000 mg | ORAL_TABLET | Freq: Three times a day (TID) | ORAL | 0 refills | Status: DC | PRN
  Filled 2021-12-29: qty 30, 5d supply, fill #0

## 2022-01-04 ENCOUNTER — Other Ambulatory Visit: Payer: Self-pay | Admitting: Internal Medicine

## 2022-01-05 ENCOUNTER — Other Ambulatory Visit (HOSPITAL_COMMUNITY): Payer: Self-pay

## 2022-01-05 MED ORDER — LISINOPRIL 20 MG PO TABS
40.0000 mg | ORAL_TABLET | Freq: Every day | ORAL | 1 refills | Status: DC
Start: 2022-01-05 — End: 2022-03-30
  Filled 2022-01-05: qty 180, 90d supply, fill #0
  Filled 2022-03-23: qty 180, 90d supply, fill #1

## 2022-01-24 ENCOUNTER — Ambulatory Visit (INDEPENDENT_AMBULATORY_CARE_PROVIDER_SITE_OTHER): Payer: Medicare Other | Admitting: Student

## 2022-01-24 ENCOUNTER — Encounter: Payer: Self-pay | Admitting: Student

## 2022-01-24 ENCOUNTER — Other Ambulatory Visit (HOSPITAL_COMMUNITY): Payer: Self-pay

## 2022-01-24 ENCOUNTER — Other Ambulatory Visit: Payer: Self-pay | Admitting: Internal Medicine

## 2022-01-24 VITALS — BP 139/115 | HR 85 | Temp 98.7°F | Ht 65.0 in | Wt 248.4 lb

## 2022-01-24 DIAGNOSIS — H60391 Other infective otitis externa, right ear: Secondary | ICD-10-CM

## 2022-01-24 DIAGNOSIS — H60501 Unspecified acute noninfective otitis externa, right ear: Secondary | ICD-10-CM

## 2022-01-24 DIAGNOSIS — R252 Cramp and spasm: Secondary | ICD-10-CM

## 2022-01-24 DIAGNOSIS — H6091 Unspecified otitis externa, right ear: Secondary | ICD-10-CM

## 2022-01-24 HISTORY — DX: Unspecified otitis externa, right ear: H60.91

## 2022-01-24 MED ORDER — CIPROFLOXACIN-DEXAMETHASONE 0.3-0.1 % OT SUSP
4.0000 [drp] | Freq: Two times a day (BID) | OTIC | 0 refills | Status: AC
  Filled 2022-01-24: qty 7.5, 7d supply, fill #0

## 2022-01-24 MED ORDER — METHOCARBAMOL 500 MG PO TABS
1000.0000 mg | ORAL_TABLET | Freq: Three times a day (TID) | ORAL | 0 refills | Status: DC | PRN
  Filled 2022-01-24: qty 30, 5d supply, fill #0

## 2022-01-24 NOTE — Assessment & Plan Note (Addendum)
Kendra Vega is presenting today to discuss recent ear drainage over the last 4-5 days. She reports feeling "fullness" in her right ear and intermittently seeing white purulent drainage. Mentions she put a Q-tip in her right here and more drainage came out the other day. Denies tinnitus, hearing loss, or ear pain. She mentions her boyfriend has allergy-like symptoms, but otherwise no sick contacts. She denies runny nose, watery eyes, sore throat, fever, chills, body aches. Does mention that she regularly uses bobby pins in order to clean out her ears.  ? ?On physical exam, right ear canal with purulent drainage and erythema to the posterior wall. Difficult to visualize any abrasions. Presentation most consistent with otitis externa. Counseled patient on cleaning ears with flushes rather than bobby pins or Q-tips. Advised patient to not stick anything else in her ears.  ? ?- Ciprodex solution, 4 drops twice daily x7d ?- Return to clinic if symptoms do not improve ?

## 2022-01-24 NOTE — Progress Notes (Signed)
? ?  CC: ear drainage ? ?HPI: ? ?[name] is a 51 y.o. person with medical history as below presenting to [facility] for right ear drainage. ? ?Please see problem-based list for further details, assessments, and plans. ? ?Past Medical History:  ?Diagnosis Date  ? Bipolar disorder (HCC)   ? 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 Breast mass   ? rt breast ?abcess x's 1 week  ? Chronic kidney disease   ? Dental caries   ? Depression   ? GERD (gastroesophageal reflux disease)   ? Hypertension   ? Stroke [facility])   ? Type 2 diabetes mellitus without complication, without long-term current use of insulin (HCC) 10/21/2018  ? ?Review of Systems:  As per HPI ? ?Physical Exam: ? ?Vitals:  ? 01/24/22 1016  ?BP: (!) 139/115  ?Pulse: 85  ?Temp: 98.7 ?F (37.1 ?C)  ?TempSrc: Oral  ?SpO2: 100%  ?Weight: 248 lb 6.4 oz (112.7 kg)  ?Height: 5\' 5"  (1.651 m)  ? ?General: Resting comfortably in chair, no acute distress ?HENT: Right auricle without erythema, drainage, or obvious abnormalities. R canal with exudative drainage and erythematous on posterior portion of canal. Tympanic membrane in tact, non-bulging, without erythema. No mastoid tenderness.    ?Pulm: Normal respiratory effort on room air. ?Neuro: Awake, alert, conversing appropriately. ? ?Assessment & Plan:  ? ?See Encounters Tab for problem based charting. ? ?Patient discussed with Dr.  ? ? ?

## 2022-01-24 NOTE — Patient Instructions (Signed)
Ms.Sandeep Charlesetta Ivory, it was a pleasure seeing you today! ? ?Today we discussed: ?- Ear infection: Please use four drops twice daily for seven days. If symptoms do not improve after completion, please return to clinic. In addition, please make sure to avoid inserting anything into your ear canal. You can use the flushes as you have been doing at home. ? ?I have ordered the following medication/changed the following medications:  ? ? ?Start the following medications: ?Meds ordered this encounter  ?Medications  ? ciprofloxacin-dexamethasone (CIPRODEX) OTIC suspension  ?  Sig: Place 4 drops into the right ear 2 (two) times daily for 7 days.  ?  Dispense:  2.8 mL  ?  Refill:  0  ? ?Follow-up:  as needed   ? ?Please make sure to arrive 15 minutes prior to your next appointment. If you arrive late, you may be asked to reschedule.  ? ?We look forward to seeing you next time. Please call our clinic at [phone] if you have any questions or concerns. The best time to call is Monday-Friday from 9am-4pm, but there is someone available 24/7. If after hours or the weekend, call the main hospital number and ask for the Internal Medicine Resident On-Call. If you need medication refills, please notify your pharmacy one week in advance and they will send [place] a request. ? ?Thank you for letting us take part in your care. Wishing you the best! ? ?Thank you, ?Evlyn Kanner, MD ?

## 2022-01-25 NOTE — Progress Notes (Signed)
Internal Medicine Clinic Attending ? ?Case discussed with Dr. Braswell  At the time of the visit.  We reviewed the resident?s history and exam and pertinent patient test results.  I agree with the assessment, diagnosis, and plan of care documented in the resident?s note.  ?

## 2022-02-18 ENCOUNTER — Other Ambulatory Visit: Payer: Self-pay | Admitting: Internal Medicine

## 2022-02-18 DIAGNOSIS — R252 Cramp and spasm: Secondary | ICD-10-CM

## 2022-02-19 ENCOUNTER — Other Ambulatory Visit (HOSPITAL_COMMUNITY): Payer: Self-pay

## 2022-02-20 ENCOUNTER — Other Ambulatory Visit: Payer: Self-pay | Admitting: Internal Medicine

## 2022-02-20 ENCOUNTER — Other Ambulatory Visit (HOSPITAL_COMMUNITY): Payer: Self-pay

## 2022-02-20 DIAGNOSIS — R252 Cramp and spasm: Secondary | ICD-10-CM

## 2022-02-20 MED ORDER — ATORVASTATIN CALCIUM 40 MG PO TABS
40.0000 mg | ORAL_TABLET | Freq: Every day | ORAL | 3 refills | Status: DC
  Filled 2022-02-20: qty 30, 30d supply, fill #0
  Filled 2022-03-23: qty 30, 30d supply, fill #1
  Filled 2022-04-25: qty 30, 30d supply, fill #2
  Filled 2022-06-03: qty 30, 30d supply, fill #3
  Filled 2022-07-02: qty 30, 30d supply, fill #4
  Filled 2022-08-09: qty 30, 30d supply, fill #5
  Filled 2022-09-09: qty 30, 30d supply, fill #6
  Filled 2022-10-19: qty 30, 30d supply, fill #7
  Filled 2022-11-29: qty 30, 30d supply, fill #8
  Filled 2023-01-07: qty 30, 30d supply, fill #9
  Filled 2023-02-14: qty 30, 30d supply, fill #10

## 2022-02-20 MED ORDER — METHOCARBAMOL 500 MG PO TABS
1000.0000 mg | ORAL_TABLET | Freq: Three times a day (TID) | ORAL | 0 refills | Status: DC | PRN
  Filled 2022-02-20: qty 30, 5d supply, fill #0

## 2022-02-23 ENCOUNTER — Other Ambulatory Visit (HOSPITAL_COMMUNITY): Payer: Self-pay

## 2022-02-23 ENCOUNTER — Other Ambulatory Visit: Payer: Self-pay | Admitting: Internal Medicine

## 2022-02-26 ENCOUNTER — Other Ambulatory Visit (HOSPITAL_COMMUNITY): Payer: Self-pay

## 2022-02-26 ENCOUNTER — Other Ambulatory Visit: Payer: Self-pay | Admitting: Internal Medicine

## 2022-02-27 ENCOUNTER — Other Ambulatory Visit (HOSPITAL_COMMUNITY): Payer: Self-pay

## 2022-02-28 ENCOUNTER — Other Ambulatory Visit (HOSPITAL_COMMUNITY): Payer: Self-pay

## 2022-03-01 ENCOUNTER — Other Ambulatory Visit (HOSPITAL_COMMUNITY): Payer: Self-pay

## 2022-03-01 ENCOUNTER — Other Ambulatory Visit: Payer: Self-pay | Admitting: Internal Medicine

## 2022-03-01 MED ORDER — PANTOPRAZOLE SODIUM 20 MG PO TBEC
20.0000 mg | DELAYED_RELEASE_TABLET | Freq: Every day | ORAL | 11 refills | Status: DC
Start: 2022-03-01 — End: 2022-04-02
  Filled 2022-03-01: qty 30, 30d supply, fill #0
  Filled 2022-03-23: qty 30, 30d supply, fill #1

## 2022-03-01 MED ORDER — LABETALOL HCL 200 MG PO TABS
200.0000 mg | ORAL_TABLET | Freq: Two times a day (BID) | ORAL | 2 refills | Status: DC
  Filled 2022-03-01: qty 60, 30d supply, fill #0
  Filled 2022-04-25: qty 60, 30d supply, fill #1
  Filled 2022-06-20: qty 60, 30d supply, fill #2

## 2022-03-01 NOTE — Addendum Note (Signed)
Addended by: Dickie La on: 03/01/2022 12:51 PM ? ? Modules accepted: Orders ? ?

## 2022-03-01 NOTE — Telephone Encounter (Signed)
Received refill request for esomeprazole, noted to be contraindicated in the setting Plavix use due to decreased efficacy of anti-platelet. Pantoprazole appears to be less likely to interact, alternative PPI sent to patient pharmacy. Attempted to call Kendra Vega to describe this change but no answer. Will attempt again. ?

## 2022-03-12 ENCOUNTER — Telehealth: Payer: Self-pay | Admitting: Internal Medicine

## 2022-03-12 NOTE — Telephone Encounter (Signed)
Unable to reach Kendra Vega to discuss pantoprazole. F/u with me scheduled 04/02/22. ?

## 2022-03-15 ENCOUNTER — Other Ambulatory Visit: Payer: Self-pay | Admitting: Internal Medicine

## 2022-03-15 DIAGNOSIS — R252 Cramp and spasm: Secondary | ICD-10-CM

## 2022-03-16 ENCOUNTER — Other Ambulatory Visit (HOSPITAL_COMMUNITY): Payer: Self-pay

## 2022-03-16 MED ORDER — METHOCARBAMOL 500 MG PO TABS
1000.0000 mg | ORAL_TABLET | Freq: Three times a day (TID) | ORAL | 0 refills | Status: DC | PRN
  Filled 2022-03-16: qty 30, 5d supply, fill #0

## 2022-03-23 ENCOUNTER — Other Ambulatory Visit (HOSPITAL_COMMUNITY): Payer: Self-pay

## 2022-03-26 ENCOUNTER — Other Ambulatory Visit (HOSPITAL_COMMUNITY): Payer: Self-pay

## 2022-03-30 ENCOUNTER — Telehealth: Payer: Self-pay | Admitting: *Deleted

## 2022-03-30 ENCOUNTER — Other Ambulatory Visit (HOSPITAL_COMMUNITY): Payer: Self-pay

## 2022-03-30 DIAGNOSIS — Z720 Tobacco use: Secondary | ICD-10-CM | POA: Diagnosis not present

## 2022-03-30 DIAGNOSIS — D631 Anemia in chronic kidney disease: Secondary | ICD-10-CM | POA: Diagnosis not present

## 2022-03-30 DIAGNOSIS — I129 Hypertensive chronic kidney disease with stage 1 through stage 4 chronic kidney disease, or unspecified chronic kidney disease: Secondary | ICD-10-CM | POA: Diagnosis not present

## 2022-03-30 DIAGNOSIS — E785 Hyperlipidemia, unspecified: Secondary | ICD-10-CM | POA: Diagnosis not present

## 2022-03-30 DIAGNOSIS — N189 Chronic kidney disease, unspecified: Secondary | ICD-10-CM | POA: Diagnosis not present

## 2022-03-30 DIAGNOSIS — N2581 Secondary hyperparathyroidism of renal origin: Secondary | ICD-10-CM | POA: Diagnosis not present

## 2022-03-30 DIAGNOSIS — N183 Chronic kidney disease, stage 3 unspecified: Secondary | ICD-10-CM | POA: Diagnosis not present

## 2022-03-30 DIAGNOSIS — I639 Cerebral infarction, unspecified: Secondary | ICD-10-CM | POA: Diagnosis not present

## 2022-03-30 DIAGNOSIS — N1831 Chronic kidney disease, stage 3a: Secondary | ICD-10-CM | POA: Diagnosis not present

## 2022-03-30 DIAGNOSIS — E1122 Type 2 diabetes mellitus with diabetic chronic kidney disease: Secondary | ICD-10-CM | POA: Diagnosis not present

## 2022-03-30 LAB — MICROALBUMIN / CREATININE URINE RATIO: Microalb Creat Ratio: 177

## 2022-03-30 MED ORDER — LISINOPRIL 30 MG PO TABS
30.0000 mg | ORAL_TABLET | Freq: Every day | ORAL | 3 refills | Status: DC
  Filled 2022-03-30: qty 40, 40d supply, fill #0
  Filled 2022-03-30: qty 50, 50d supply, fill #0

## 2022-03-30 NOTE — Telephone Encounter (Signed)
Pt was called to remind her to bring all of her meds to the [facility] on Monday @ 1045 Am with Dr Sol Blazing. ?

## 2022-04-02 ENCOUNTER — Other Ambulatory Visit: Payer: Self-pay

## 2022-04-02 ENCOUNTER — Other Ambulatory Visit (HOSPITAL_COMMUNITY): Payer: Self-pay

## 2022-04-02 ENCOUNTER — Encounter: Payer: Self-pay | Admitting: Internal Medicine

## 2022-04-02 ENCOUNTER — Ambulatory Visit (INDEPENDENT_AMBULATORY_CARE_PROVIDER_SITE_OTHER): Payer: Medicare Other | Admitting: Internal Medicine

## 2022-04-02 VITALS — BP 153/96 | HR 76 | Temp 98.3°F | Ht 65.0 in | Wt 246.9 lb

## 2022-04-02 DIAGNOSIS — E119 Type 2 diabetes mellitus without complications: Secondary | ICD-10-CM

## 2022-04-02 DIAGNOSIS — E1122 Type 2 diabetes mellitus with diabetic chronic kidney disease: Secondary | ICD-10-CM | POA: Diagnosis not present

## 2022-04-02 DIAGNOSIS — K219 Gastro-esophageal reflux disease without esophagitis: Secondary | ICD-10-CM | POA: Diagnosis not present

## 2022-04-02 DIAGNOSIS — Z8673 Personal history of transient ischemic attack (TIA), and cerebral infarction without residual deficits: Secondary | ICD-10-CM

## 2022-04-02 DIAGNOSIS — I129 Hypertensive chronic kidney disease with stage 1 through stage 4 chronic kidney disease, or unspecified chronic kidney disease: Secondary | ICD-10-CM

## 2022-04-02 DIAGNOSIS — I10 Essential (primary) hypertension: Secondary | ICD-10-CM

## 2022-04-02 DIAGNOSIS — M545 Low back pain, unspecified: Secondary | ICD-10-CM | POA: Diagnosis not present

## 2022-04-02 DIAGNOSIS — N1832 Chronic kidney disease, stage 3b: Secondary | ICD-10-CM | POA: Diagnosis not present

## 2022-04-02 DIAGNOSIS — F418 Other specified anxiety disorders: Secondary | ICD-10-CM

## 2022-04-02 DIAGNOSIS — F17210 Nicotine dependence, cigarettes, uncomplicated: Secondary | ICD-10-CM | POA: Diagnosis not present

## 2022-04-02 DIAGNOSIS — E785 Hyperlipidemia, unspecified: Secondary | ICD-10-CM | POA: Diagnosis not present

## 2022-04-02 DIAGNOSIS — Z6841 Body Mass Index (BMI) 40.0 and over, adult: Secondary | ICD-10-CM

## 2022-04-02 DIAGNOSIS — G8929 Other chronic pain: Secondary | ICD-10-CM

## 2022-04-02 DIAGNOSIS — R252 Cramp and spasm: Secondary | ICD-10-CM | POA: Diagnosis not present

## 2022-04-02 DIAGNOSIS — F17200 Nicotine dependence, unspecified, uncomplicated: Secondary | ICD-10-CM

## 2022-04-02 DIAGNOSIS — Z0000 Encounter for general adult medical examination without abnormal findings: Secondary | ICD-10-CM

## 2022-04-02 LAB — POCT GLYCOSYLATED HEMOGLOBIN (HGB A1C): Hemoglobin A1C: 5.9 % — AB (ref 4.0–5.6)

## 2022-04-02 LAB — GLUCOSE, CAPILLARY: Glucose-Capillary: 124 mg/dL — ABNORMAL HIGH (ref 70–99)

## 2022-04-02 MED ORDER — PANTOPRAZOLE SODIUM 40 MG PO TBEC
40.0000 mg | DELAYED_RELEASE_TABLET | Freq: Every day | ORAL | 3 refills | Status: DC
  Filled 2022-04-02: qty 90, 90d supply, fill #0
  Filled 2022-06-22: qty 90, 90d supply, fill #1
  Filled 2022-09-03: qty 90, 90d supply, fill #2
  Filled 2022-11-20: qty 90, 90d supply, fill #3

## 2022-04-02 MED ORDER — LIDOCAINE 5 % EX PTCH
1.0000 | MEDICATED_PATCH | CUTANEOUS | 0 refills | Status: DC
  Filled 2022-04-02: qty 20, 10d supply, fill #0
  Filled 2022-04-04: qty 20, 20d supply, fill #0

## 2022-04-02 MED ORDER — SEMAGLUTIDE(0.25 OR 0.5MG/DOS) 2 MG/3ML ~~LOC~~ SOPN
0.2500 mg | PEN_INJECTOR | SUBCUTANEOUS | 0 refills | Status: DC
  Filled 2022-04-02: qty 3, 56d supply, fill #0
  Filled 2022-04-02: qty 3, 84d supply, fill #0

## 2022-04-02 NOTE — Progress Notes (Signed)
?Subjective:  ? ?Patient ID: Kendra Vega female   DOB: 1971/01/01 51 y.o.   MRN: [id number] ? ?HPI: ?Kendra Vega is a 51 y.o. person here for follow-up of hypertension, hyperlipidemia, obesity, CKD3b, health maintenance, and first visit with me.  ? ?For details of today's visit, please see problem-based charting. ? ? ?Past Medical History:  ?Diagnosis Date  ? Bipolar disorder ([place])   ? 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 Breast mass   ? rt breast ?abcess x's 1 week  ? Chronic kidney disease   ? Dental caries   ? Depression   ? GERD (gastroesophageal reflux disease)   ? Hypertension   ? Stroke [facility])   ? Type 2 diabetes mellitus without complication, without long-term current use of insulin ([place]) 10/21/2018  ? ?Current Outpatient Medications  ?Medication Sig Dispense Refill  ? acetaminophen (TYLENOL) 500 MG tablet Take 1,000 mg by mouth every 8 (eight) hours as needed for moderate pain.    ? Biotin 10000 MCG TABS Take 1 tablet by mouth daily.    ? lidocaine (LIDODERM) 5 % Place 1 patch onto the skin daily. Remove & Discard patch within 12 hours or as directed by MD 20 patch 0  ? Semaglutide,0.25 or 0.5MG/DOS, 2 MG/3ML SOPN Inject 0.25 mg into the skin once a week for 4 weeks then 0.43m once a week 3 mL 0  ? amLODipine (NORVASC) 10 MG tablet Take 1 tablet (10 mg total) by mouth daily. 90 tablet 1  ? aspirin EC 81 MG tablet Take 81 mg by mouth daily. Swallow whole.    ? atorvastatin (LIPITOR) 40 MG tablet Take 1 tablet (40 mg total) by mouth daily. 90 tablet 3  ? Blood Glucose Monitoring Suppl (TRUE METRIX METER) DEVI 1 each by Does not apply route daily. 1 Device 0  ? clopidogrel (PLAVIX) 75 MG tablet Take 1 tablet (75 mg total) by mouth daily. 90 tablet 2  ? ELDERBERRY PO Take by mouth.    ? FLUoxetine (PROZAC) 40 MG capsule Take 1 capsule (40 mg total) by mouth daily. 90 capsule 2  ? furosemide (LASIX) 80 MG tablet Take 1 tablet (80 mg total) by mouth daily. 90 tablet 1  ? glucose blood  (TRUE METRIX BLOOD GLUCOSE TEST) test strip Use once daily 100 each 12  ? hydrOXYzine (ATARAX/VISTARIL) 25 MG tablet Take 1 tablet (25 mg total) by mouth 3 (three) times daily. 90 tablet 6  ? labetalol (NORMODYNE) 200 MG tablet Take 1 tablet (200 mg total) by mouth 2 (two) times daily. 60 tablet 2  ? lisinopril (ZESTRIL) 30 MG tablet Take 1 tablet (30 mg total) by mouth daily. 90 tablet 3  ? Omega-3 Fatty Acids (FISH OIL) 1000 MG CAPS Take 1,000 mg by mouth daily.    ? pantoprazole (PROTONIX) 40 MG tablet Take 1 tablet (40 mg total) by mouth daily. 90 tablet 3  ? QUEtiapine (SEROQUEL XR) 300 MG 24 hr tablet Take 300 mg by mouth at bedtime.    ? TRUEPLUS LANCETS 28G MISC 1 each by Does not apply route daily. 100 each 12  ? ?No current facility-administered medications for this visit.  ? ?Family History  ?Problem Relation Age of Onset  ? Kidney disease Mother   ? Diabetes Mother   ? Hypertension Mother   ? Hypertension Sister   ? Hypertension Sister   ? Hypertension Sister   ? Heart attack Son   ? ?Social History  ? ?  Socioeconomic History  ? Marital status: Significant Other  ?  Spouse name: Not on file  ? Number of children: 1  ? Years of education: Not on file  ? Highest education level: 10th grade  ?Occupational History  ? Not on file  ?Tobacco Use  ? Smoking status: Every Day  ?  Packs/day: 0.20  ?  Years: 7.00  ?  Pack years: 1.40  ?  Types: Cigarettes  ? Smokeless tobacco: Never  ? Tobacco comments:  ?  smokes 2-3 daily " Black and Milds "   ?Vaping Use  ? Vaping Use: Never used  ?Substance and Sexual Activity  ? Alcohol use: Not Currently  ?  Comment: rare glass of wine  ? Drug use: Yes  ?  Frequency: 1.0 times per week  ?  Types: Marijuana  ?  Comment: occasional marijuana   ? Sexual activity: Not Currently  ?  Partners: Male  ?  Birth control/protection: None  ?Other Topics Concern  ? Not on file  ?Social History Narrative  ? Current Social History 06/21/2021    ?   ? Patient lives with a significant other in  an apartment which is 1 story/stories. There are not steps up to the entrance the patient uses.   ?   ? Patient's method of transportation is via family member or public transportation if needed.  ?   ? The highest level of education was some high school.  ?   ? The patient currently disabled but would like part time job.   ?   ? Identified important Relationships are "my dogs"  ?   ? Pets : 2  ?    ? Interests / Fun: "I love to shop"   ?   ? Current Stressors: son's passing and her bills  ?   ? Religious / Personal Beliefs: "I just believe in Jesus Christ"   ?    ? ?Social Determinants of Health  ? ?Financial Resource Strain: Not on file  ?Food Insecurity: Not on file  ?Transportation Needs: Not on file  ?Physical Activity: Not on file  ?Stress: Not on file  ?Social Connections: Not on file  ? ?Objective:  ?Physical Exam: ?Vitals:  ? 04/02/22 1046 04/02/22 1134  ?BP: (!) 149/79 (!) 153/96  ?Pulse: 80 76  ?Temp: 98.3 ?F (36.8 ?C)   ?TempSrc: Oral   ?SpO2: 100%   ?Weight: 246 lb 14.4 oz (112 kg)   ?Height: '5\' 5"'  (1.651 m)   ? ?General appearance: alert, cooperative, and appears stated age ?Lungs: clear to auscultation bilaterally ?Heart: regular rate and rhythm, S1, S2 normal, no murmur, click, rub or gallop ?Extremities: extremities normal, atraumatic, no cyanosis or edema ?Pulses: palpable DP pulses bilaterally ?Psych: full affect, intermittently tearful ?Assessment & Plan:  ? ?See Encounters tab for problem-based charting. ? ?Kendra Vega was seen today for check-up visit and medication refill. ? ?Diagnoses and all orders for this visit: ? ?Resistant hypertension ?-     Ambulatory referral to Sleep Studies ?-     Ambulatory referral to Vascular Surgery ? ?Type 2 diabetes mellitus without complication, without long-term current use of insulin ([place]) ?-     Ambulatory referral to Ophthalmology ?-     POC Hbg A1C ?-     Glucose, capillary ? ?Stage 3b chronic kidney disease ([place]) ? ?Chronic bilateral low back pain without  sciatica ?-     lidocaine (LIDODERM) 5 %; Place 1 patch onto the skin daily. Remove &  Discard patch within 12 hours or as directed by MD ?-     Ambulatory referral to Physical Therapy ? ?Gastroesophageal reflux disease without esophagitis ?-     pantoprazole (PROTONIX) 40 MG tablet; Take 1 tablet (40 mg total) by mouth daily. ? ?Muscle cramps ? ?Hyperlipidemia, unspecified hyperlipidemia type ? ?History of CVA (cerebrovascular accident) ? ?Class 3 severe obesity due to excess calories without serious comorbidity with body mass index (BMI) of 40.0 to 44.9 in adult [facility]) ? ?Smoker ? ?Depression with anxiety ? ?Other orders ?-     Semaglutide,0.25 or 0.5MG/DOS, 2 MG/3ML SOPN; Inject 0.25 mg into the skin once a week for 4 weeks then 0.60m once a week ? ?  ?Orders Placed This Encounter  ?Procedures  ? Glucose, capillary  ? Ambulatory referral to Ophthalmology  ?  Referral Priority:   Routine  ?  Referral Type:   Consultation  ?  Referral Reason:   Specialty Services Required  ?  Requested Specialty:   Ophthalmology  ?  Number of Visits Requested:   1  ? Ambulatory referral to Sleep Studies  ?  Referral Priority:   Routine  ?  Referral Type:   Consultation  ?  Referral Reason:   Specialty Services Required  ?  Number of Visits Requested:   1  ? Ambulatory referral to Vascular Surgery  ?  Referral Priority:   Routine  ?  Referral Type:   Surgical  ?  Referral Reason:   Specialty Services Required  ?  Requested Specialty:   Vascular Surgery  ?  Number of Visits Requested:   1  ? Ambulatory referral to Physical Therapy  ?  Referral Priority:   Routine  ?  Referral Type:   Physical Medicine  ?  Referral Reason:   Specialty Services Required  ?  Requested Specialty:   Physical Therapy  ?  Number of Visits Requested:   1  ? POC Hbg A1C  ? ?Meds ordered this encounter  ?Medications  ? pantoprazole (PROTONIX) 40 MG tablet  ?  Sig: Take 1 tablet (40 mg total) by mouth daily.  ?  Dispense:  90 tablet  ?  Refill:  3  ? lidocaine  (LIDODERM) 5 %  ?  Sig: Place 1 patch onto the skin daily. Remove & Discard patch within 12 hours or as directed by MD  ?  Dispense:  20 patch  ?  Refill:  0  ?  change to Q24h per MD 04/02/22 maf  ? Semaglutide,0

## 2022-04-02 NOTE — Patient Instructions (Addendum)
Thank you, [name] for allowing [place] to provide your care today.  ? ?Please call gastroenterology to reschedule your colonoscopy [phone]. ? ?I have place a referrals to: ?-Physical therapy for back pain ?-Vascular surgery for kidney artery narrowing and high blood pressure ?-Sleep medicine for sleep study ?-Eye doctor for diabetic eye exam ? ?I have ordered the following medication/changed the following medications:  ?Pantoprazole 40 mg daily (for heartburn) ?Semaglutide 0.25 mg weekly (for diabetes and weight loss) ?Lidocaine patch every 12 hours as needed (for back pain) ?Tylenol 1000 mg every 8 hours as needed (for back pain) ? ?My "Nutrition Prescription" ?Today we discussed your health and nutrition goals. In order to meet these goals, we have come up with a specific plan to make changes towards meeting these goals.  ?Here is the "prescription" we wrote today: ?Replace sweetened beverages such as regular soda, sweet tea, etc with unsweetened options. ?Eat plenty of vegetables, fruits, legumes, whole grains, nuts, and seeds.  ?Limit sugary drinks (soda, sweet tea, fruit juice, energy drinks), processed meats (bacon, sausage, deli meat), processed snacks (chips, pretzels, crackers), red meats (beef, pork, lamb), sweets (cakes, cookies, pastries). ?Even small changes can have big impacts, so stay positive! ? ?Please follow-up in in 4 weeks to discuss weight. ? ?Should you have any questions or concerns please call the internal medicine clinic at [phone].   ? ?Charise Killian, MD ?[facility] ? ? ?My Chart Access: ?[email or web address] ? ?

## 2022-04-02 NOTE — Progress Notes (Signed)
Called [facility] for fax # for ROI form - stated they will fax OV notes, form not needed; our fax# given. Also ROI form faxed to [place] for medical records. ?

## 2022-04-03 ENCOUNTER — Encounter: Payer: Self-pay | Admitting: Internal Medicine

## 2022-04-03 ENCOUNTER — Other Ambulatory Visit (HOSPITAL_COMMUNITY): Payer: Self-pay

## 2022-04-03 DIAGNOSIS — G8929 Other chronic pain: Secondary | ICD-10-CM | POA: Insufficient documentation

## 2022-04-03 MED ORDER — CIPROFLOXACIN HCL 500 MG PO TABS
500.0000 mg | ORAL_TABLET | Freq: Two times a day (BID) | ORAL | 0 refills | Status: DC
  Filled 2022-04-03: qty 6, 3d supply, fill #0

## 2022-04-03 NOTE — Assessment & Plan Note (Addendum)
Kendra Vega continues to have significant grief and depression surrounding the loss of her son several years ago. She is intermittently tearful throughout today's exam. We discussed her loss and current support. She notes some other family dysfunction that is stressful for her but notes the support of her aunt especially. She also started working part-time at [employer] and feels this has been very good for her. She denies any SI. Follows with [facility] for behavioral health care. ?-Continue fluoxetine 40 mg, quetiapine 300 mg qhs, and hydroxyzine 25 mg tid per behavioral health ?

## 2022-04-03 NOTE — Assessment & Plan Note (Signed)
Ms. Jankowiak has had chronic bilateral low back pain, noting it started when her son picked her up several years ago. She has treated this with Tylenol 1000 mg daily, as well as methocarbamol. Pain is now only with getting up from a seated position, when she is seated or once she is standing the pain is absent. She notes no pain when working at her job and no other limitation. Tylenol does help. Given infrequent nature, we discussed conservative therapy with Tylenol 1000 q8h prn, lidocaine patch prn, and physical therapy for strengthening.  ?

## 2022-04-03 NOTE — Assessment & Plan Note (Signed)
Blood pressure continues to be above goal <130/80. Current regimen includes furosemide 80 mg, amlodipine 10 mg, labetalol 200 bid, and lisinopril 30 mg. Lisinopril was recently increased by nephrology whom Kendra Vega saw 5/5. She reports she is not taking hydralazine at this time. Given medication adjustment by nephrology and recent visit, I have requested records for review prior to addition of other medication. 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 Referral to [facility] discussed at that time but does not appear that this went forward. Sleep study not scheduled and referral closed. Will refer back to both for further evaluation.  ?-Records request from Dr. Hollie Salk, [facility]. BMP, CBC, uACR ordered at visit 03/30/22. ?-Referral to [facility], sleep studies ?-Continue furosemide 80 mg, amlodipine 10 mg, labetalol 200 mg bid, and lisinopril 30 mg ?

## 2022-04-03 NOTE — Assessment & Plan Note (Signed)
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 [name] remains on aspirin and Plavix. I have requested records from her neurologist for review. Remains on high-intensity statin. Continue to work on lifestyle modification, as well as smoking cessation, diabetes, and hypertension control to reduce risk. ?

## 2022-04-03 NOTE — Assessment & Plan Note (Signed)
Longstanding PPI use for GERD. We discussed switching from esomeprazole to pantoprazole given possible the former's potential interaction with Plavix. Ms. Billard notes that pantoprazole 20 mg did not seem to help as much and started taking OTC omeprazole. We discussed the possible interaction between omeprazole and Plavix as well and she is willing to trial a higher dose of pantoprazole. ?-Increase pantoprazole to 40 mg daily ?

## 2022-04-03 NOTE — Assessment & Plan Note (Addendum)
Continues to smoke 2-3 Black and Milds/day. She did tolerate Chantix due to nightmares and previously declined bupropion or nicotine patches. She notes she needs to quit, however it does not appear she is fully ready at this time. Will continue to address. ?

## 2022-04-03 NOTE — Assessment & Plan Note (Signed)
Ms. Hellmer notes muscle cramps that occur in her legs and abdomen intermittently. Previously was on methocarbamol for many years, however she notes this does not seem to help. We discussed stopping this medication given no effect. Ms. Cashatt now takes mustard for cramps and notes relief.  ?-Encourage adequate hydration, review labs from nephrology ?

## 2022-04-03 NOTE — Assessment & Plan Note (Signed)
Weight remains stable, BMI 41. [name] notes the need to lose weight and has been attempting dietary changes such as reducing fried foods and adding fruit. We discussed other beneficial changes and decided on reduction of sweetened beverages as the next months intervention. [name] will attempt to swab unsweetened beverages for soda, sweet tea, etc. We discussed water as the ideal beverage and she will try to increase her intake. We also discussed the weight benefit of GLP1-RA and she is amenable to trial. ?-Return in one month to continue lifestyle counseling ?

## 2022-04-03 NOTE — Assessment & Plan Note (Signed)
Kendra Vega declines vaccinations as she believes these triggered her prior strokes. She had to cancel her prior colonoscopy due to a death in the family, phone number provided to reschedule. Mammogram and Pap UTD. ?

## 2022-04-03 NOTE — Assessment & Plan Note (Signed)
Continue atorvastatin 40 mg daily, recheck lipids due around 05/2022. ?

## 2022-04-03 NOTE — Assessment & Plan Note (Addendum)
Following with [facility]. Complicated by secondary hyperparathyroidism. Records requested today. May be a good candidate for SGLT2i therapy if significant microalbuminuria. ?-BMP, uACR, CBC ordered by nephrology 03/30/22, records requested ?

## 2022-04-03 NOTE — Assessment & Plan Note (Signed)
Non-insulin dependent T2DM, not currently on medical therapy. Foot exam updated today, notable for several small calluses but no wounds or other deformity. Kendra Vega has not completed a recent diabetic eye exam, referral placed today. We discussed GLP1-RA therapy for diabetes and CVD benefit, as well as weight loss, today and she is amenable. Discussed potential SE and likely titration. ?-Referral to ophthalmology for diabetic eye exam ?-Start semaglutide 0.25 mg weekly ?-uACR completed 03/30/22 with nephrology, records requested ?-Atorvastatin 40 mg daily, due for lipid panel 05/2022 ?

## 2022-04-04 ENCOUNTER — Other Ambulatory Visit (HOSPITAL_COMMUNITY): Payer: Self-pay

## 2022-04-09 ENCOUNTER — Other Ambulatory Visit: Payer: Self-pay

## 2022-04-09 NOTE — Telephone Encounter (Signed)
DECISION : ? ? ? ?Approved today ? ?Request Reference Number: [id number].  ? ? ?LIDOCAINE PAD 5%  ? ?is approved through 11/25/2022.  ? ?Your patient may now fill this prescription and it will be covered. ? ? ?Drug ? ? ?Lidocaine 5% patches ? ?Form ? ?[employer] Medicare Part D Electronic Prior Authorization Form (2017 NCPDP) ?Original Claim Info ?[id number],527 Provide Exception Process Printed NoticeSubmit ICD-10 from MD documented on RxTransition N/A,Dr Submit ePA [email or web address] ? ? ( COPY SENT TO PHARMACY )  ?

## 2022-04-09 NOTE — Telephone Encounter (Signed)
Pa for pt ( LIDOCAINE 5% )  came through on cover my meds was submitted with office notes  .. awaiting approval  or denial  ?

## 2022-04-18 ENCOUNTER — Ambulatory Visit: Payer: Medicare Other

## 2022-04-25 ENCOUNTER — Other Ambulatory Visit (HOSPITAL_COMMUNITY): Payer: Self-pay

## 2022-04-25 ENCOUNTER — Other Ambulatory Visit: Payer: Self-pay | Admitting: Internal Medicine

## 2022-04-25 NOTE — Telephone Encounter (Signed)
Next appt scheduled 04/30/22 with PCP. ?

## 2022-04-26 ENCOUNTER — Other Ambulatory Visit (HOSPITAL_COMMUNITY): Payer: Self-pay

## 2022-04-26 MED ORDER — AMLODIPINE BESYLATE 10 MG PO TABS
10.0000 mg | ORAL_TABLET | Freq: Every day | ORAL | 3 refills | Status: DC
  Filled 2022-04-26: qty 90, 90d supply, fill #0
  Filled 2022-08-01: qty 90, 90d supply, fill #1
  Filled 2022-11-05: qty 90, 90d supply, fill #2
  Filled 2023-02-14: qty 90, 90d supply, fill #3

## 2022-04-27 ENCOUNTER — Other Ambulatory Visit (HOSPITAL_COMMUNITY): Payer: Self-pay

## 2022-04-30 ENCOUNTER — Ambulatory Visit (INDEPENDENT_AMBULATORY_CARE_PROVIDER_SITE_OTHER): Payer: Medicare Other | Admitting: Internal Medicine

## 2022-04-30 ENCOUNTER — Encounter: Payer: Self-pay | Admitting: Internal Medicine

## 2022-04-30 ENCOUNTER — Other Ambulatory Visit (HOSPITAL_COMMUNITY): Payer: Self-pay

## 2022-04-30 ENCOUNTER — Other Ambulatory Visit: Payer: Self-pay

## 2022-04-30 VITALS — BP 150/95 | HR 75 | Temp 98.2°F | Ht 65.0 in | Wt 237.1 lb

## 2022-04-30 DIAGNOSIS — Z1211 Encounter for screening for malignant neoplasm of colon: Secondary | ICD-10-CM

## 2022-04-30 DIAGNOSIS — F418 Other specified anxiety disorders: Secondary | ICD-10-CM

## 2022-04-30 DIAGNOSIS — F319 Bipolar disorder, unspecified: Secondary | ICD-10-CM | POA: Diagnosis not present

## 2022-04-30 DIAGNOSIS — F17200 Nicotine dependence, unspecified, uncomplicated: Secondary | ICD-10-CM

## 2022-04-30 DIAGNOSIS — F17210 Nicotine dependence, cigarettes, uncomplicated: Secondary | ICD-10-CM

## 2022-04-30 DIAGNOSIS — I10 Essential (primary) hypertension: Secondary | ICD-10-CM

## 2022-04-30 DIAGNOSIS — E1151 Type 2 diabetes mellitus with diabetic peripheral angiopathy without gangrene: Secondary | ICD-10-CM | POA: Diagnosis not present

## 2022-04-30 DIAGNOSIS — I872 Venous insufficiency (chronic) (peripheral): Secondary | ICD-10-CM | POA: Diagnosis not present

## 2022-04-30 DIAGNOSIS — E119 Type 2 diabetes mellitus without complications: Secondary | ICD-10-CM

## 2022-04-30 MED ORDER — NICOTINE POLACRILEX 4 MG MT GUM
4.0000 mg | CHEWING_GUM | OROMUCOSAL | 0 refills | Status: DC | PRN
  Filled 2022-04-30: qty 100, 14d supply, fill #0

## 2022-04-30 MED ORDER — LISINOPRIL 40 MG PO TABS
40.0000 mg | ORAL_TABLET | Freq: Every day | ORAL | 11 refills | Status: DC
  Filled 2022-04-30: qty 30, 30d supply, fill #0
  Filled 2022-06-03: qty 30, 30d supply, fill #1
  Filled 2022-07-02: qty 30, 30d supply, fill #2
  Filled 2022-08-01: qty 30, 30d supply, fill #3
  Filled 2022-09-03: qty 30, 30d supply, fill #4
  Filled 2022-10-09: qty 30, 30d supply, fill #5
  Filled 2022-11-05: qty 30, 30d supply, fill #6
  Filled 2022-12-09: qty 30, 30d supply, fill #7
  Filled 2023-01-10: qty 30, 30d supply, fill #8
  Filled 2023-02-16: qty 30, 30d supply, fill #9
  Filled 2023-03-21: qty 30, 30d supply, fill #10
  Filled 2023-04-25: qty 30, 30d supply, fill #11

## 2022-04-30 MED ORDER — SEMAGLUTIDE(0.25 OR 0.5MG/DOS) 2 MG/3ML ~~LOC~~ SOPN
0.5000 mg | PEN_INJECTOR | SUBCUTANEOUS | 0 refills | Status: DC
  Filled 2022-04-30: qty 3, 28d supply, fill #0

## 2022-04-30 NOTE — Assessment & Plan Note (Addendum)
Continues on GLP1-RA without adverse effect. She has decreased soda intake from around 12 to 4/week, however switched over to drinking fruit juices. We discussed the sugar content of juice and encouraged limiting these as well. She has increased water intake. She is open to referral to diabetes counselor and RD today for further nutrition counseling. Discussed need for annual ophthalmology exam and provided number to schedule with Dr. Nile Riggs. uACR completed 03/30/22 with nephrology, 177. Will repeat A1c at 8 week f/u visit. -Referral to nutrition

## 2022-04-30 NOTE — Progress Notes (Signed)
Established Patient Office Visit  Subjective   Patient ID: Kendra Vega, female    DOB: [birth year]  Age: 51 y.o. MRN: [id number]  Chief Complaint  Patient presents with   Follow-up    BP.   LLE pain    [name] is a 51 yo person here for follow-up of diabetes, weight, hypertension, smoking cessation, and health maintenance. Today, she also expresses concerns over left leg pain. Please see problem-based charting for further details of today's visit.   Patient Active Problem List   Diagnosis Date Noted   Venous insufficiency of left leg 04/30/2022   Screening for malignant neoplasm of colon 04/30/2022   Chronic bilateral low back pain without sciatica 04/03/2022   Healthcare maintenance 09/20/2020   Restless leg 03/15/2020   Grief at loss of child 06/30/2019   Type 2 diabetes mellitus without complication, without long-term current use of insulin ([place]) 10/21/2018   Muscle cramps 07/25/2018   Headache 10/22/2017   CRAO (central retinal artery occlusion), right    HLD (hyperlipidemia) 08/03/2017   History of CVA (cerebrovascular accident) 10/28/2016   CKD (chronic kidney disease), stage III ([place]) 10/28/2016   OBESITY 01/19/2011   GASTROESOPHAGEAL REFLUX DISEASE 08/10/2010   Bipolar depression ([place]) 08/10/2010   Depression with anxiety 06/27/2010   Smoker 06/27/2010   VISUAL ACUITY, DECREASED, RIGHT EYE 06/07/2010   Resistant hypertension 06/07/2010   Social History   Tobacco Use   Smoking status: Every Day    Packs/day: 0.20    Years: 7.00    Pack years: 1.40    Types: Cigarettes   Smokeless tobacco: Never   Tobacco comments:    smokes 2-3 daily " Black and Milds "   Vaping Use   Vaping Use: Never used  Substance Use Topics   Alcohol use: Not Currently    Comment: rare glass of wine   Drug use: Yes    Frequency: 1.0 times per week    Types: Marijuana    Comment: occasional marijuana       ROS    Objective:     BP (!) 150/95 (BP Location: Left  Arm, Cuff Size: Normal)   Pulse 75   Temp 98.2 F (36.8 C) (Oral)   Ht 5\' 5"  (1.651 m)   Wt 237 lb 1.6 oz (107.5 kg)   SpO2 100% Comment: RA  BMI 39.46 kg/m  Wt Readings from Last 3 Encounters:  04/30/22 237 lb 1.6 oz (107.5 kg)  04/02/22 246 lb 14.4 oz (112 kg)  01/24/22 248 lb 6.4 oz (112.7 kg)    Physical Exam Vitals reviewed.  Constitutional:      General: She is not in acute distress.    Appearance: Normal appearance.  Musculoskeletal:        General: No swelling or tenderness. Normal range of motion.     Comments: Varicose vein over medial left leg near the ankle. Spider veins present over left lateral calf.  Neurological:     Mental Status: She is alert.  Psychiatric:        Mood and Affect: Mood normal.    Results for orders placed or performed in visit on 04/30/22  Microalbumin / creatinine urine ratio  Result Value Ref Range   Microalb Creat Ratio 177 mg/g      Assessment & Plan:   Problem List Items Addressed This Visit       Cardiovascular and Mediastinum   Resistant hypertension    Blood pressure remains above goal <130/80 at  150/95 today. Current regimen includes furosemide 80 mg, amlodipine 10 mg, labetalol 200 bid, and lisinopril 30 mg. [name] reports home pressures in a similar range (systolic Q000111Q) but feels today's pressure is most likely due to her continued tobacco use. She does note she smoked prior to today's visit. We discussed smoking cessation techniques as outlined elsewhere. She has not received a call from vascular surgery or sleep medicine as previously referred, I will ask our staff to check on this. In the meantime, we have increased lisinopril to 40 mg daily. Spironolactone may be a good next line given elevation in urine ACR at 177 during nephrology visit 03/30/22. K 3.7, Cr 1.4, GFR 45. -Increase lisinopril to 40 mg daily -Tobacco cessation counseling provided, NRT prescribed -F/u on vascular and sleep referrals       Relevant  Medications   lisinopril (ZESTRIL) 40 MG tablet   Venous insufficiency of left leg    Varicose vein present over medial left calf near the ankle, causes intermittent pain. Dilated veins also seen over lateral left leg. 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 She attempts to elevate her legs when at home. We discussed discomfort is likely from venous insufficiency and recommend continued elevation and compression stockings. Good pulses bilaterally.        Relevant Medications   lisinopril (ZESTRIL) 40 MG tablet     Endocrine   Type 2 diabetes mellitus without complication, without long-term current use of insulin ([place]) - Primary    Continues on GLP1-RA without adverse effect. She has decreased soda intake from around 12 to 4/week, however switched over to drinking fruit juices. We discussed the sugar content of juice and encouraged limiting these as well. She has increased water intake. She is open to referral to diabetes counselor and RD today for further nutrition counseling. Discussed need for annual ophthalmology exam and provided number to schedule with Dr. Gershon Vega. uACR completed 03/30/22 with nephrology, 177. Will repeat A1c at 8 week f/u visit. -Referral to nutrition       Relevant Medications   lisinopril (ZESTRIL) 40 MG tablet   Semaglutide,0.25 or 0.5MG /DOS, 2 MG/3ML SOPN   Other Relevant Orders   Microalbumin / creatinine urine ratio (Completed)   Referral to Nutrition and Diabetes Services     Other   Depression with anxiety    [name] reports mood is doing well today although PHQ-9 score 15 from 11. [name] notes significant benefit to her grief through her part-time job as it gives her interaction with others. Encouraged her to continue to find healthy coping mechanisms and follow with behavioral health. Buspirone noted on outside medication list, [name] reports taking this as needed although rarely given her improved mood with  employment.       Relevant Medications   busPIRone (BUSPAR) 10 MG tablet   Smoker    Continues to smoke 3 Black and Milds/day. [name] appears more motivated today and endorses several times the importance of quitting. Triggers include stress and anxiety. She had nightmares with Chantix and nicotine patches. She is not interested in bupropion today but would like to try nicotine gum. We discussed appropriate use and prescription sent to patient pharmacy. -Nicotine gum prn -F/u at next visit       Relevant Medications   nicotine polacrilex (NICORETTE) 4 MG gum   Bipolar depression (H[facility])   Screening for malignant neoplasm of colon    Ms.  Kendra Vega did not schedule colonoscopy as discussed at last visit. Today, she endorses significant anxiety surrounding anesthesia given the death of her son in his sleep. She has no family history of colon cancer and no symptoms. We discussed alternative screening modalities including stool-based testing which she was amenable to. We discussed the need for f/u colonoscopy if positive and she is agreeable. -Annual FIT testing       Relevant Orders   Fecal occult blood, imunochemical    Return in about 8 weeks (around 06/25/2022) for f/u diabetes.    Charise Killian, MD

## 2022-04-30 NOTE — Assessment & Plan Note (Addendum)
Kendra Vega reports mood is doing well today although PHQ-9 score 15 from 11. Kendra Vega notes significant benefit to her grief through her part-time job as it gives her interaction with others. Encouraged her to continue to find healthy coping mechanisms and follow with behavioral health. Buspirone noted on outside medication list, Kendra Vega reports taking this as needed although rarely given her improved mood with employment.

## 2022-04-30 NOTE — Assessment & Plan Note (Signed)
Kendra Vega did not schedule colonoscopy as discussed at last visit. Today, she endorses significant anxiety surrounding anesthesia given the death of her son in his sleep. She has no family history of colon cancer and no symptoms. We discussed alternative screening modalities including stool-based testing which she was amenable to. We discussed the need for f/u colonoscopy if positive and she is agreeable. -Annual FIT testing

## 2022-04-30 NOTE — Assessment & Plan Note (Signed)
Varicose vein present over medial left calf near the ankle, causes intermittent pain. Dilated veins also seen over lateral left leg. 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 She attempts to elevate her legs when at home. We discussed discomfort is likely from venous insufficiency and recommend continued elevation and compression stockings. Good pulses bilaterally.

## 2022-04-30 NOTE — Assessment & Plan Note (Signed)
Blood pressure remains above goal <130/80 at 150/95 today. Current regimen includes furosemide 80 mg, amlodipine 10 mg, labetalol 200 bid, and lisinopril 30 mg. Ms. Savely reports home pressures in a similar range (systolic Q000111Q) but feels today's pressure is most likely due to her continued tobacco use. She does note she smoked prior to today's visit. We discussed smoking cessation techniques as outlined elsewhere. She has not received a call from vascular surgery or sleep medicine as previously referred, I will ask our staff to check on this. In the meantime, we have increased lisinopril to 40 mg daily. Spironolactone may be a good next line given elevation in urine ACR at 177 during nephrology visit 03/30/22. K 3.7, Cr 1.4, GFR 45. -Increase lisinopril to 40 mg daily -Tobacco cessation counseling provided, NRT prescribed -F/u on vascular and sleep referrals

## 2022-04-30 NOTE — Patient Instructions (Addendum)
For your leg pain, please continue to elevate when at home and during work you may try compression stockings.  Please schedule your eye appointment with Dr. Nile Riggs: [phone]

## 2022-04-30 NOTE — Assessment & Plan Note (Signed)
Continues to smoke 3 Black and Milds/day. Kendra Vega appears more motivated today and endorses several times the importance of quitting. Triggers include stress and anxiety. She had nightmares with Chantix and nicotine patches. She is not interested in bupropion today but would like to try nicotine gum. We discussed appropriate use and prescription sent to patient pharmacy. -Nicotine gum prn -F/u at next visit

## 2022-05-07 ENCOUNTER — Ambulatory Visit: Payer: Medicare Other | Attending: Internal Medicine

## 2022-05-07 ENCOUNTER — Other Ambulatory Visit (HOSPITAL_COMMUNITY): Payer: Self-pay

## 2022-05-07 DIAGNOSIS — M5459 Other low back pain: Secondary | ICD-10-CM | POA: Diagnosis not present

## 2022-05-07 DIAGNOSIS — M6281 Muscle weakness (generalized): Secondary | ICD-10-CM | POA: Diagnosis not present

## 2022-05-07 DIAGNOSIS — M545 Low back pain, unspecified: Secondary | ICD-10-CM | POA: Diagnosis not present

## 2022-05-07 DIAGNOSIS — R2689 Other abnormalities of gait and mobility: Secondary | ICD-10-CM | POA: Diagnosis not present

## 2022-05-07 DIAGNOSIS — G8929 Other chronic pain: Secondary | ICD-10-CM | POA: Insufficient documentation

## 2022-05-07 NOTE — Therapy (Addendum)
OUTPATIENT PHYSICAL THERAPY THORACOLUMBAR EVALUATION/DISCHARGE  PHYSICAL THERAPY DISCHARGE SUMMARY  Visits from Start of Care: 1  Current functional level related to goals / functional outcomes: Unable to assess   Remaining deficits: Unable to assess   Education / Equipment: HEP   Patient agrees to discharge. Patient goals were Unable to assess. Patient is being discharged due to not returning since the last visit.   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   Breast mass    rt breast ?abcess x's 1 week   Chronic kidney disease    Dental caries    Depression    GERD (gastroesophageal reflux disease)    Hypertension    Otitis externa of right ear 01/24/2022   Stroke ([place])    Type 2 diabetes mellitus without complication, without long-term current use of insulin ([place]) 10/21/2018   WARTS, HAND 01/19/2011   Qualifier: Diagnosis of  By: Hassell Done FNP, Tori Milks     Past Surgical History:  Procedure Laterality Date   BREAST CYST ASPIRATION Right 08/06/2017   breast abcess aspirated   MULTIPLE TOOTH EXTRACTIONS     TOOTH EXTRACTION N/A 12/11/2019   Procedure: EXTRACTION MOLARS;  Surgeon: Diona Browner, DDS;  Location: [facility];  Service: Oral Surgery;  Laterality: N/A;   Patient Active Problem List   Diagnosis Date Noted   Venous insufficiency of left leg 04/30/2022   Screening for malignant neoplasm of colon 04/30/2022   Chronic bilateral low  back pain without sciatica 04/03/2022   Healthcare maintenance 09/20/2020   Restless leg 03/15/2020   Grief at loss of child 06/30/2019   Type 2 diabetes mellitus without complication, without long-term current use of insulin ([place]) 10/21/2018   Muscle cramps 07/25/2018   Headache 10/22/2017   CRAO (central retinal artery occlusion), right    HLD (hyperlipidemia) 08/03/2017   History of CVA (cerebrovascular accident) 10/28/2016   CKD (chronic kidney disease), stage III ([place]) 10/28/2016   OBESITY 01/19/2011   GASTROESOPHAGEAL REFLUX DISEASE 08/10/2010   Bipolar depression ([place]) 08/10/2010   Depression with anxiety 06/27/2010   Smoker 06/27/2010   VISUAL ACUITY, DECREASED, RIGHT EYE 06/07/2010   Resistant hypertension 06/07/2010    PCP: Charise Killian, MD   REFERRING PROVIDER: Charise Killian, MD   REFERRING DIAG: M54.50,G89.29 (ICD-10-CM) - Chronic bilateral low back pain without sciatica  Rationale for Evaluation and Treatment Rehabilitation  THERAPY DIAG:  Other low back pain - Plan: PT plan of care cert/re-cert  Muscle weakness (generalized) - Plan: PT plan of care cert/re-cert  Other abnormalities of gait and mobility - Plan: PT plan of care cert/re-cert  ONSET DATE: Chronic  SUBJECTIVE:  SUBJECTIVE STATEMENT: Pt presents to PT with reports of chronic L sided lower back pain and discomfort. Pt has been long standing, with no overt previous MOI. Denies bowel/bladder changes or saddle anesthesia. Also denies LE paresthesias, but does have pain in L anterior tibial area and believes this is a separate issue. She has complex PMH with past CVAs, is unsure of what lingering deficits this may have caused but generally feels unsteady or off balance.   PERTINENT HISTORY:  Blindness in Rt eye, DM II, CVA,  Depression, CKD  PAIN:  Are you having pain?  Yes: NPRS scale: 7/10 (10/10 at worst)  Pain location: L sided lower back Pain description: sharp  Aggravating factors: lying flat, prolonged Relieving factors: standing, movement   PRECAUTIONS: None  WEIGHT BEARING RESTRICTIONS: No  FALLS:  Has patient fallen in last 6 months? No  LIVING ENVIRONMENT: Lives with: lives alone Lives in: House/apartment Stairs: No Has following equipment at home: Single point cane  OCCUPATION: works part time at [employer]: Independent and Independent with basic ADLs  PATIENT GOALS: improve walking ability and decrease pain   OBJECTIVE:   DIAGNOSTIC FINDINGS:  See imaging  PATIENT SURVEYS:  FOTO: 60% function; 67% predicted   COGNITION:  Overall cognitive status: Within functional limits for tasks assessed     SENSATION: WFL  POSTURE: rounded shoulders, forward head, increased lumbar lordosis, and large body habitus  PALPATION: TTP to L lumbar paraspinals  LUMBAR ROM:   Active  A/PROM  eval  Flexion Increased pain  Extension Decreased  pain   (Blank rows = not tested)  LOWER EXTREMITY MMT:    MMT Right 05/07/2022  Left 05/07/2022   Hip flexion  4/5 3+/5  Hip extension    Hip abduction 3+/5 3/5  Hip adduction 3+/5 3/5  Hip external rotation    Hip internal rotation    Knee extension 4/5 3+/5  Knee flexion 4/5 3+/5  Ankle dorsiflexion     Ankle plantarflexion    Ankle inversion    Ankle eversion    Grossly    (Blank rows = not tested)  LUMBAR SPECIAL TESTS:  Straight leg raise test: Positive and Slump test: Negative  FUNCTIONAL TESTS:  30 Second Sit to Stand: 5 reps - increased pain  GAIT: Distance walked: 47ft Assistive device utilized: None Level of assistance: Complete Independence Comments: antalgic gait, wide BoS  TODAY'S TREATMENT  [facility] Adult PT Treatment:                                                DATE: 05/07/2022 Therapeutic  Exercise: Supine clamshell GTB x 15 Supine PPT x 5 - 5" hold POE x 60" Prone press up on elbows x 5  PATIENT EDUCATION:  Education details: eval findings, FOTO, HEP, POC Person educated: Patient Education method: Explanation, Demonstration, and Handouts Education comprehension: verbalized understanding and returned demonstration  HOME EXERCISE PROGRAM: Access Code: [email or web address] URL: [email or web address] Date: 05/07/2022 Prepared by: [place] with Resistance  - 1 x daily - 7 x weekly - 3 sets - 10 reps - Supine Posterior Pelvic Tilt  - 1 x daily - 7 x weekly - 3 sets - 10 reps - 5 sec hold - Static Prone on Elbows  - 1 x daily - 7 x weekly - 2-3 reps - 60  sec hold - Prone Press Up On Elbows  - 1 x daily - 7 x weekly - 2-3 sets - 10 reps  ASSESSMENT:  CLINICAL IMPRESSION: Patient is a 51 y.o. F who was seen today for physical therapy evaluation and treatment for chronic L sided LBP. Physical findings are consistent with physician impression as pt demonstrates pain and limited functional mobility, assessed via 30 Second Sit to Stand. She also demonstrates functional proximal hip weakness, L>R. Her FOTO score indicates she is operating below her baseline PLOF, showing she would benefit from skilled PT services working on improving strength/mobility and decreasing pain.     OBJECTIVE IMPAIRMENTS decreased activity tolerance, decreased balance, decreased endurance, decreased mobility, difficulty walking, decreased strength, and pain.   ACTIVITY LIMITATIONS carrying, lifting, bending, squatting, stairs, transfers, and bed mobility  PARTICIPATION LIMITATIONS: community activity and occupation  [place], Time since onset of injury/illness/exacerbation, and 3+ comorbidities: Blindness in Rt eye, DM II, CVA, Depression, CKD  are also affecting patient's functional outcome.   REHAB POTENTIAL: Excellent  CLINICAL DECISION  MAKING: Evolving/moderate complexity  EVALUATION COMPLEXITY: Moderate   GOALS: Goals reviewed with patient? No  SHORT TERM GOALS: Target date: 05/28/2022  Pt will be compliant and knowledgeable with initial HEP for improved comfort and carryover Baseline: initial HEP given Goal status: INITIAL  2.  Pt will self report lower back pain no greater than 7/10 for improved comfort and functional ability Baseline: 10/10 at worst Goal status: INITIAL  LONG TERM GOALS: Target date: 07/02/2022  Pt will improve FOTO function score to no less than 67% as proxy for functional improvement Baseline: 60% function Goal status: INITIAL  2.  Pt will self report lower back pain no greater than 3/10 for improved comfort and functional ability Baseline: 10/10 at worst Goal status: INITIAL  3.  Pt will increase 30 Second Sit to Stand rep count to no less than 8 reps for improved balance, strength, and functional mobility Baseline: 5 reps  Goal status: INITIAL  4.  Pt will be able to walking >35min with no increase in LBP for improvement in function and getting back to desired recreational activity Baseline: unable Goal status: INITIAL  PLAN: PT FREQUENCY: 1-2x/week  PT DURATION: 8 weeks  PLANNED INTERVENTIONS: Therapeutic exercises, Therapeutic activity, Neuromuscular re-education, Balance training, Gait training, Patient/Family education, Joint mobilization, Aquatic Therapy, Dry Needling, Electrical stimulation, Cryotherapy, Moist heat, Manual therapy, and Re-evaluation.  PLAN FOR NEXT SESSION: assess HEP response, proximal hip and neutral spine core strengthening, manual therapy for decreasing pain, extension-bias?   Ward Chatters, PT 05/07/2022, 12:31 PM

## 2022-05-08 ENCOUNTER — Other Ambulatory Visit: Payer: Self-pay | Admitting: *Deleted

## 2022-05-08 DIAGNOSIS — I701 Atherosclerosis of renal artery: Secondary | ICD-10-CM

## 2022-05-15 ENCOUNTER — Inpatient Hospital Stay (HOSPITAL_COMMUNITY): Admission: RE | Admit: 2022-05-15 | Payer: Medicare Other | Source: Ambulatory Visit

## 2022-05-17 ENCOUNTER — Other Ambulatory Visit: Payer: Self-pay

## 2022-05-17 ENCOUNTER — Telehealth: Payer: Self-pay | Admitting: Physical Therapy

## 2022-05-17 ENCOUNTER — Ambulatory Visit: Payer: Medicare Other | Admitting: Physical Therapy

## 2022-05-17 ENCOUNTER — Encounter: Payer: Self-pay | Admitting: Physical Therapy

## 2022-05-17 NOTE — Telephone Encounter (Signed)
LVM for pt regarding missed appt. Notified pt about next appt on Monday 05/21/2022 with Onalee Hua. Asked pt to please call with any questions/ concerns.

## 2022-05-17 NOTE — Therapy (Deleted)
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   Breast mass    rt breast ?abcess x's 1 week   Chronic kidney disease    Dental caries    Depression    GERD (gastroesophageal reflux disease)    Hypertension    Otitis externa of right ear 01/24/2022   Stroke (HCC)    Type 2 diabetes mellitus without complication, without long-term current use of insulin (HCC) 10/21/2018   WARTS, HAND 01/19/2011   Qualifier: Diagnosis of  By: Daphine Deutscher FNP, Zena Amos     Past Surgical History:  Procedure Laterality Date   BREAST CYST ASPIRATION Right 08/06/2017   breast abcess aspirated   MULTIPLE TOOTH EXTRACTIONS     TOOTH EXTRACTION N/A 12/11/2019   Procedure: EXTRACTION MOLARS;  Surgeon: Ocie Doyne, DDS;  Location: [facility] OR;  Service: Oral Surgery;  Laterality: N/A;   Patient Active Problem List   Diagnosis Date Noted   Venous insufficiency of left leg 04/30/2022   Screening for malignant neoplasm of colon 04/30/2022   Chronic bilateral low back pain without sciatica 04/03/2022   Healthcare maintenance 09/20/2020   Restless leg 03/15/2020   Grief at loss of child 06/30/2019   Type 2 diabetes mellitus without complication, without long-term current use of insulin (HCC) 10/21/2018   Muscle cramps 07/25/2018   Headache 10/22/2017   CRAO (central retinal artery occlusion), right    HLD (hyperlipidemia) 08/03/2017   History of CVA (cerebrovascular accident) 10/28/2016   CKD (chronic kidney disease), stage III (HCC) 10/28/2016   OBESITY 01/19/2011   GASTROESOPHAGEAL REFLUX DISEASE 08/10/2010   Bipolar depression (HCC) 08/10/2010   Depression with anxiety 06/27/2010   Smoker 06/27/2010   VISUAL  ACUITY, DECREASED, RIGHT EYE 06/07/2010   Resistant hypertension 06/07/2010    REFERRING DIAG: M54.50,G89.29 (ICD-10-CM) - Chronic bilateral low back pain without sciatica  THERAPY DIAG:  Other low back pain  Muscle weakness (generalized)  Other abnormalities of gait and mobility  Rationale for Evaluation and Treatment Rehabilitation  PERTINENT HISTORY: Blindness in Rt eye, DM II, CVA, Depression, CKD  PRECAUTIONS: None  SUBJECTIVE: ***  PAIN:  Are you having pain? Yes: NPRS scale: ***/10 Pain location: *** Pain description: *** Aggravating factors: *** Relieving factors: ***  OBJECTIVE:    DIAGNOSTIC FINDINGS:  See imaging   PATIENT SURVEYS:  FOTO: 60% function; 67% predicted    COGNITION:           Overall cognitive status: Within functional limits for tasks assessed                          SENSATION: WFL   POSTURE: rounded shoulders, forward head, increased lumbar lordosis, and large body habitus   PALPATION: TTP to L lumbar paraspinals   LUMBAR ROM:    Active  A/PROM  eval  Flexion Increased pain  Extension Decreased  pain   (Blank rows = not tested)   LOWER EXTREMITY MMT:     MMT Right 05/07/2022  Left 05/07/2022   Hip flexion  4/5 3+/5  Hip extension      Hip abduction 3+/5 3/5  Hip adduction 3+/5 3/5  Hip external rotation      Hip internal rotation      Knee extension 4/5 3+/5  Knee flexion 4/5 3+/5  Ankle dorsiflexion       Ankle plantarflexion      Ankle inversion      Ankle eversion      Grossly      (Blank rows = not tested)   LUMBAR SPECIAL TESTS:  Straight leg raise test: Positive and Slump test: Negative   FUNCTIONAL TESTS:  30 Second Sit to Stand: 5 reps - increased pain   GAIT: Distance walked: 30ft Assistive device utilized: None Level of assistance: Complete Independence Comments: antalgic gait, wide BoS   TODAY'S TREATMENT  [facility] Adult PT Treatment:                                                DATE:  05/07/2022 Therapeutic Exercise: Supine clamshell GTB x 15 Supine PPT x 5 - 5" hold POE x 60" Prone press up on elbows x 5   PATIENT EDUCATION:  Education details: eval findings, FOTO, HEP, POC Person educated: Patient Education method: Explanation, Demonstration, and Handouts Education comprehension: verbalized understanding and returned demonstration   HOME EXERCISE PROGRAM: Access Code: [email or web address] URL: [email or web address] Date: 05/07/2022 Prepared by: Edwinna Areola   Exercises - Hooklying Clamshell with Resistance  - 1 x daily - 7 x weekly - 3 sets - 10 reps - Supine Posterior Pelvic Tilt  - 1 x daily - 7 x weekly - 3 sets - 10 reps - 5 sec hold - Static Prone on Elbows  - 1 x daily - 7 x weekly - 2-3 reps - 60 sec hold - Prone Press Up On Elbows  - 1 x daily - 7 x weekly - 2-3 sets - 10 reps   ASSESSMENT:   CLINICAL IMPRESSION:      OBJECTIVE IMPAIRMENTS decreased activity tolerance, decreased balance, decreased endurance, decreased mobility, difficulty walking, decreased strength, and pain.    ACTIVITY LIMITATIONS carrying, lifting, bending, squatting, stairs, transfers, and bed mobility   PARTICIPATION LIMITATIONS: community activity and occupation   PERSONAL FACTORS Fitness, Time since onset of injury/illness/exacerbation, and 3+ comorbidities: Blindness in Rt eye, DM II, CVA, Depression, CKD  are also affecting patient's functional outcome.    REHAB POTENTIAL: Excellent   CLINICAL DECISION MAKING: Evolving/moderate complexity   EVALUATION COMPLEXITY: Moderate     GOALS: Goals reviewed with patient? No   SHORT TERM GOALS: Target date: 05/28/2022   Pt will be compliant and knowledgeable with initial HEP for improved comfort and carryover Baseline: initial HEP given Goal status: INITIAL   2.  Pt will self report lower back pain no greater than 7/10 for improved comfort and functional ability Baseline: 10/10 at worst Goal status: INITIAL   LONG  TERM GOALS: Target date: 07/02/2022   Pt will improve FOTO function score to no less than 67% as proxy for functional improvement Baseline: 60% function Goal status: INITIAL   2.  Pt will self report lower back pain no greater than 3/10 for improved comfort and functional ability Baseline: 10/10 at worst Goal status: INITIAL   3.  Pt will increase 30 Second Sit to Stand rep count to no less than 8 reps for improved balance, strength, and functional mobility Baseline: 5 reps  Goal status: INITIAL   4.  Pt will  be able to walking >73min with no increase in LBP for improvement in function and getting back to desired recreational activity Baseline: unable Goal status: INITIAL   PLAN: PT FREQUENCY: 1-2x/week   PT DURATION: 8 weeks   PLANNED INTERVENTIONS: Therapeutic exercises, Therapeutic activity, Neuromuscular re-education, Balance training, Gait training, Patient/Family education, Joint mobilization, Aquatic Therapy, Dry Needling, Electrical stimulation, Cryotherapy, Moist heat, Manual therapy, and Re-evaluation.   PLAN FOR NEXT SESSION: assess HEP response, proximal hip and neutral spine core strengthening, manual therapy for decreasing pain, extension-bias?      Lynden Ang, PT 05/17/2022, 12:49 PM

## 2022-05-21 ENCOUNTER — Ambulatory Visit: Payer: Medicare Other

## 2022-05-22 ENCOUNTER — Encounter: Payer: Medicare Other | Admitting: Vascular Surgery

## 2022-05-24 NOTE — Therapy (Deleted)
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   Breast mass    rt breast ?abcess x's 1 week   Chronic kidney disease    Dental caries    Depression    GERD (gastroesophageal reflux disease)    Hypertension    Otitis externa of right ear 01/24/2022   Stroke ([place])    Type 2 diabetes mellitus without complication, without long-term current use of insulin ([place]) 10/21/2018   WARTS, HAND 01/19/2011   Qualifier: Diagnosis of  By: Hassell Done FNP, Tori Milks     Past Surgical History:  Procedure Laterality Date   BREAST CYST ASPIRATION Right 08/06/2017   breast abcess aspirated   MULTIPLE TOOTH EXTRACTIONS     TOOTH EXTRACTION N/A 12/11/2019   Procedure: EXTRACTION MOLARS;  Surgeon: Diona Browner, DDS;  Location: [facility];  Service: Oral Surgery;  Laterality: N/A;   Patient Active Problem List   Diagnosis Date Noted   Venous insufficiency of left leg 04/30/2022   Screening for malignant neoplasm of colon 04/30/2022   Chronic bilateral low back pain without sciatica 04/03/2022   Healthcare maintenance 09/20/2020   Restless leg 03/15/2020   Grief at loss of child 06/30/2019   Type 2 diabetes mellitus without complication, without long-term current use of insulin ([place]) 10/21/2018   Muscle cramps 07/25/2018   Headache 10/22/2017   CRAO (central retinal artery occlusion), right    HLD (hyperlipidemia) 08/03/2017   History of CVA (cerebrovascular accident) 10/28/2016   CKD (chronic kidney disease), stage III ([place]) 10/28/2016   OBESITY 01/19/2011   GASTROESOPHAGEAL REFLUX DISEASE 08/10/2010   Bipolar depression ([place]) 08/10/2010   Depression with anxiety 06/27/2010   Smoker 06/27/2010   VISUAL  ACUITY, DECREASED, RIGHT EYE 06/07/2010   Resistant hypertension 06/07/2010    REFERRING DIAG: M54.50,G89.29 (ICD-10-CM) - Chronic bilateral low back pain without sciatica  THERAPY DIAG:  No diagnosis found.  Rationale for Evaluation and Treatment Rehabilitation  PERTINENT HISTORY: Blindness in Rt eye, DM II, CVA, Depression, CKD  PRECAUTIONS: None  SUBJECTIVE: Pt presents to PT with reports of chronic L sided lower back pain and discomfort. Pt has been long standing, with no overt previous MOI. Denies bowel/bladder changes or saddle anesthesia. Also denies LE paresthesias, but does have pain in L anterior tibial area and believes this is a separate issue. She has complex PMH with past CVAs, is unsure of what lingering deficits this may have caused but generally feels unsteady or off balance.   PAIN:  Are you having pain? Yes: NPRS scale: ***/10 Pain location: *** Pain description: *** Aggravating factors: *** Relieving factors: ***  OBJECTIVE:    DIAGNOSTIC FINDINGS:  See imaging   PATIENT SURVEYS:  FOTO: 60% function; 67% predicted    COGNITION:           Overall cognitive status: Within functional limits for tasks assessed                          SENSATION: WFL   POSTURE: rounded shoulders, forward head, increased lumbar lordosis, and large body habitus   PALPATION: TTP to L lumbar paraspinals   LUMBAR ROM:  Active  A/PROM  eval  Flexion Increased pain  Extension Decreased  pain   (Blank rows = not tested)   LOWER EXTREMITY MMT:     MMT Right 05/07/2022  Left 05/07/2022   Hip flexion  4/5 3+/5  Hip extension      Hip abduction 3+/5 3/5  Hip adduction 3+/5 3/5  Hip external rotation      Hip internal rotation      Knee extension 4/5 3+/5  Knee flexion 4/5 3+/5  Ankle dorsiflexion       Ankle plantarflexion      Ankle inversion      Ankle eversion      Grossly      (Blank rows = not tested)   LUMBAR SPECIAL TESTS:  Straight leg raise test:  Positive and Slump test: Negative   FUNCTIONAL TESTS:  30 Second Sit to Stand: 5 reps - increased pain   GAIT: Distance walked: 37ft Assistive device utilized: None Level of assistance: Complete Independence Comments: antalgic gait, wide BoS   TODAY'S TREATMENT  [facility] Adult PT Treatment:                                                DATE: 05/07/2022 Therapeutic Exercise: Supine clamshell GTB x 15 Supine PPT x 5 - 5" hold POE x 60" Prone press up on elbows x 5   PATIENT EDUCATION:  Education details: eval findings, FOTO, HEP, POC Person educated: Patient Education method: Explanation, Demonstration, and Handouts Education comprehension: verbalized understanding and returned demonstration   HOME EXERCISE PROGRAM: Access Code: [email or web address] URL: [email or web address] Date: 05/07/2022 Prepared by: Edwinna Areola   Exercises - Hooklying Clamshell with Resistance  - 1 x daily - 7 x weekly - 3 sets - 10 reps - Supine Posterior Pelvic Tilt  - 1 x daily - 7 x weekly - 3 sets - 10 reps - 5 sec hold - Static Prone on Elbows  - 1 x daily - 7 x weekly - 2-3 reps - 60 sec hold - Prone Press Up On Elbows  - 1 x daily - 7 x weekly - 2-3 sets - 10 reps   ASSESSMENT:   CLINICAL IMPRESSION: Patient is a 51 y.o. F who was seen today for physical therapy evaluation and treatment for chronic L sided LBP. Physical findings are consistent with physician impression as pt demonstrates pain and limited functional mobility, assessed via 30 Second Sit to Stand. She also demonstrates functional proximal hip weakness, L>R. Her FOTO score indicates she is operating below her baseline PLOF, showing she would benefit from skilled PT services working on improving strength/mobility and decreasing pain.       OBJECTIVE IMPAIRMENTS decreased activity tolerance, decreased balance, decreased endurance, decreased mobility, difficulty walking, decreased strength, and pain.    ACTIVITY LIMITATIONS carrying,  lifting, bending, squatting, stairs, transfers, and bed mobility   PARTICIPATION LIMITATIONS: community activity and occupation   PERSONAL FACTORS Fitness, Time since onset of injury/illness/exacerbation, and 3+ comorbidities: Blindness in Rt eye, DM II, CVA, Depression, CKD  are also affecting patient's functional outcome.    REHAB POTENTIAL: Excellent   CLINICAL DECISION MAKING: Evolving/moderate complexity   EVALUATION COMPLEXITY: Moderate     GOALS: Goals reviewed with patient? No   SHORT TERM GOALS: Target date: 05/28/2022   Pt will be compliant and knowledgeable with initial HEP for  improved comfort and carryover Baseline: initial HEP given Goal status: INITIAL   2.  Pt will self report lower back pain no greater than 7/10 for improved comfort and functional ability Baseline: 10/10 at worst Goal status: INITIAL   LONG TERM GOALS: Target date: 07/02/2022   Pt will improve FOTO function score to no less than 67% as proxy for functional improvement Baseline: 60% function Goal status: INITIAL   2.  Pt will self report lower back pain no greater than 3/10 for improved comfort and functional ability Baseline: 10/10 at worst Goal status: INITIAL   3.  Pt will increase 30 Second Sit to Stand rep count to no less than 8 reps for improved balance, strength, and functional mobility Baseline: 5 reps  Goal status: INITIAL   4.  Pt will be able to walking >70min with no increase in LBP for improvement in function and getting back to desired recreational activity Baseline: unable Goal status: INITIAL   PLAN: PT FREQUENCY: 1-2x/week   PT DURATION: 8 weeks   PLANNED INTERVENTIONS: Therapeutic exercises, Therapeutic activity, Neuromuscular re-education, Balance training, Gait training, Patient/Family education, Joint mobilization, Aquatic Therapy, Dry Needling, Electrical stimulation, Cryotherapy, Moist heat, Manual therapy, and Re-evaluation.   PLAN FOR NEXT SESSION: assess HEP  response, proximal hip and neutral spine core strengthening, manual therapy for decreasing pain, extension-bias?        Champ Mungo, PT 05/24/2022, 4:51 PM

## 2022-05-28 ENCOUNTER — Telehealth: Payer: Self-pay | Admitting: Physical Therapy

## 2022-05-28 ENCOUNTER — Encounter: Payer: Self-pay | Admitting: Surgery

## 2022-05-28 ENCOUNTER — Ambulatory Visit (INDEPENDENT_AMBULATORY_CARE_PROVIDER_SITE_OTHER): Payer: Medicare Other | Admitting: Surgery

## 2022-05-28 ENCOUNTER — Other Ambulatory Visit: Payer: Self-pay

## 2022-05-28 ENCOUNTER — Ambulatory Visit: Payer: Medicare Other | Attending: Internal Medicine | Admitting: Physical Therapy

## 2022-05-28 VITALS — BP 136/90 | HR 74 | Temp 97.9°F | Resp 20 | Ht 65.0 in | Wt 229.0 lb

## 2022-05-28 DIAGNOSIS — I701 Atherosclerosis of renal artery: Secondary | ICD-10-CM

## 2022-05-28 NOTE — Progress Notes (Signed)
[facility] [place]  [name] MRN: [id number] DOB: [birth year] Sex: female   REQUESTING PROVIDER:    Dr. Sol Blazing   REASON FOR CONSULT:    Renal artery stenosis  HISTORY OF PRESENT ILLNESS:   Kendra Vega is a 51 y.o. female, who is referred for evaluation of renal artery stenosis in the setting of difficult to control hypertension.  She is on 3 medications for hypertension.  Patient suffers from type 2 diabetes, on insulin.  She has a history of central retinal artery occlusion on the right.  She is a current smoker.  She takes a statin for hypercholesterolemia.  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   Breast mass    rt breast ?abcess x's 1 week   Chronic kidney disease    Dental caries    Depression    GERD (gastroesophageal reflux disease)    Hypertension    Otitis externa of right ear 01/24/2022   Stroke (HCC)    Type 2 diabetes mellitus without complication, without long-term current use of insulin (HCC) 10/21/2018   WARTS, HAND 01/19/2011   Qualifier: Diagnosis of  By: Daphine Deutscher FNP, Zena Amos       FAMILY HISTORY   Family History  Problem Relation Age of Onset   Kidney disease Mother    Diabetes Mother    Hypertension Mother    Hypertension Sister    Hypertension Sister    Hypertension Sister    Heart attack Son     SOCIAL HISTORY:   Social History   Socioeconomic History   Marital status: Significant Other    Spouse name: Not on file   Number of children: 1   Years of education: Not on file   Highest education level: 10th grade  Occupational History   Not on file  Tobacco Use   Smoking status: Every Day    Packs/day: 0.20    Years: 7.00    Total pack years: 1.40    Types: Cigarettes   Smokeless tobacco: Never   Tobacco comments:    smokes 2-3 daily " Black and Milds "    Vaping Use   Vaping Use: Never used  Substance and Sexual Activity   Alcohol use: Not Currently    Comment: rare glass of wine   Drug use: Yes    Frequency: 1.0 times per week    Types: Marijuana    Comment: occasional marijuana    Sexual activity: Not Currently    Partners: Male    Birth control/protection: None  Other Topics Concern   Not on file  Social History Narrative   Current Social History 06/21/2021        Patient lives with a significant other in an apartment which is 1 story/stories. There are not steps up to the entrance the patient uses.       Patient's method of transportation is via family member or public transportation if needed.      The highest level of education was some high school.      The patient currently disabled but would like part time job.       Identified important Relationships are "my dogs"      Pets : 2       Interests / Fun: "I love to shop"       Current  Stressors: son's passing and her bills      Religious / Personal Beliefs: "I just believe in [place]"        Social Determinants of Health   Financial Resource Strain: Not on file  Food Insecurity: Not on file  Transportation Needs: Not on file  Physical Activity: Not on file  Stress: Not on file  Social Connections: Not on file  Intimate Partner Violence: Not on file    ALLERGIES:    No Known Allergies  CURRENT MEDICATIONS:    Current Outpatient Medications  Medication Sig Dispense Refill   acetaminophen (TYLENOL) 500 MG tablet Take 1,000 mg by mouth every 8 (eight) hours as needed for moderate pain.     amLODipine (NORVASC) 10 MG tablet Take 1 tablet (10 mg total) by mouth daily. 90 tablet 3   aspirin EC 81 MG tablet Take 81 mg by mouth daily. Swallow whole.     atorvastatin (LIPITOR) 40 MG tablet Take 1 tablet (40 mg total) by mouth daily. 90 tablet 3   Biotin 10000 MCG TABS Take 1 tablet by mouth daily.     Blood Glucose Monitoring Suppl (TRUE METRIX METER)  DEVI 1 each by Does not apply route daily. 1 Device 0   busPIRone (BUSPAR) 10 MG tablet Take 10 mg by mouth 3 (three) times daily as needed.     clopidogrel (PLAVIX) 75 MG tablet Take 1 tablet (75 mg total) by mouth daily. 90 tablet 2   ELDERBERRY PO Take by mouth.     FLUoxetine (PROZAC) 40 MG capsule Take 1 capsule (40 mg total) by mouth daily. 90 capsule 2   furosemide (LASIX) 80 MG tablet Take 1 tablet (80 mg total) by mouth daily. 90 tablet 1   glucose blood (TRUE METRIX BLOOD GLUCOSE TEST) test strip Use once daily 100 each 12   hydrOXYzine (ATARAX/VISTARIL) 25 MG tablet Take 1 tablet (25 mg total) by mouth 3 (three) times daily. 90 tablet 6   labetalol (NORMODYNE) 200 MG tablet Take 1 tablet (200 mg total) by mouth 2 (two) times daily. 60 tablet 2   lidocaine (LIDODERM) 5 % Place 1 patch onto the skin daily. Remove & Discard patch within 12 hours or as directed by MD 20 patch 0   lisinopril (ZESTRIL) 40 MG tablet Take 1 tablet (40 mg total) by mouth daily. 30 tablet 11   nicotine polacrilex (NICORETTE) 4 MG gum Take 1 each (4 mg total) by mouth as needed for smoking cessation. 100 tablet 0   Omega-3 Fatty Acids (FISH OIL) 1000 MG CAPS Take 1,000 mg by mouth daily.     pantoprazole (PROTONIX) 40 MG tablet Take 1 tablet (40 mg total) by mouth daily. 90 tablet 3   QUEtiapine (SEROQUEL XR) 300 MG 24 hr tablet Take 300 mg by mouth at bedtime.     Semaglutide,0.25 or 0.5MG /DOS, 2 MG/3ML SOPN Inject 0.5 mg into the skin once a week. 3 mL 0   TRUEPLUS LANCETS 28G MISC 1 each by Does not apply route daily. 100 each 12   No current facility-administered medications for this visit.    REVIEW OF SYSTEMS:   [X]  denotes positive finding, [ ]  denotes negative finding Cardiac  Comments:  Chest pain or chest pressure:    Shortness of breath upon exertion:    Short of breath when lying flat:    Irregular heart rhythm:        Vascular    Pain in calf, thigh, or hip  brought on by ambulation:     Pain in feet at night that wakes you up from your sleep:     Blood clot in your veins:    Leg swelling:         Pulmonary    Oxygen at home:    Productive cough:     Wheezing:         Neurologic    Sudden weakness in arms or legs:     Sudden numbness in arms or legs:     Sudden onset of difficulty speaking or slurred speech:    Temporary loss of vision in one eye:     Problems with dizziness:         Gastrointestinal    Blood in stool:      Vomited blood:         Genitourinary    Burning when urinating:     Blood in urine:        Psychiatric    Major depression:         Hematologic    Bleeding problems:    Problems with blood clotting too easily:        Skin    Rashes or ulcers:        Constitutional    Fever or chills:     PHYSICAL EXAM:   Vitals:   05/28/22 0827  BP: 136/90  Pulse: 74  Resp: 20  Temp: 97.9 F (36.6 C)  SpO2: 97%  Weight: 229 lb (103.9 kg)  Height: 5\' 5"  (1.651 m)    GENERAL: The patient is a well-nourished female, in no acute distress. The vital signs are documented above. CARDIAC: There is a regular rate and rhythm.  PULMONARY: Nonlabored respirations ABDOMEN: Soft and non-tender with no abdominal bruits  MUSCULOSKELETAL: There are no major deformities or cyanosis. NEUROLOGIC: No focal weakness or paresthesias are detected. SKIN: There are no ulcers or rashes noted. PSYCHIATRIC: The patient has a normal affect.  STUDIES:   I reviewed the following ultrasound study: Renal:     Right: Normal size right kidney. Normal right Resisitive Index. No         evidence of right renal artery stenosis. RRV flow present.  Left:  Normal size of left kidney. Normal left Resistive Index.         Upper range 1-59% stenosis of the left renal artery. LRV flow         present.      ASSESSMENT and PLAN   Renal artery stenosis: The patient has had 3 strokes and is now on 3 blood pressure medications.  She states her blood pressure has been  difficult to control, especially her diastolic component which is usually over 90.  She has an ultrasound from December that shows possible significant left renal artery stenosis with normal resistive indices and normal kidney size.  I propose 2 options.  The first would be to continue with medication titration to see if we can optimize her blood pressure.  The second would be proceeding with angiography to definitively determine whether or not she has a significant left renal artery stenosis.  If she does, I would proceed with stenting.  I discussed that this has the potential to improve her blood pressure, but that is not guaranteed, as people have different responses to renal artery stenting.  We have agreed to proceed with renal artery angiography and stenting if a significant stenosis is identified.  We also discussed the importance of smoking  cessation.   Charlena Cross, MD, FACS [facility] ([place] [phone] Pager [phone]

## 2022-05-28 NOTE — Telephone Encounter (Signed)
LVM for pt with reminder for appt next week. Plan to call pt back due to 3 No shows. Pt to be discharged at this time.

## 2022-06-03 ENCOUNTER — Other Ambulatory Visit: Payer: Self-pay

## 2022-06-04 ENCOUNTER — Ambulatory Visit: Payer: Medicare Other

## 2022-06-04 ENCOUNTER — Other Ambulatory Visit (HOSPITAL_COMMUNITY): Payer: Self-pay

## 2022-06-04 ENCOUNTER — Other Ambulatory Visit: Payer: Self-pay | Admitting: Internal Medicine

## 2022-06-05 ENCOUNTER — Other Ambulatory Visit (HOSPITAL_COMMUNITY): Payer: Self-pay

## 2022-06-05 MED ORDER — FUROSEMIDE 80 MG PO TABS
80.0000 mg | ORAL_TABLET | Freq: Every day | ORAL | 1 refills | Status: DC
  Filled 2022-06-05: qty 90, 90d supply, fill #0
  Filled 2022-09-03: qty 90, 90d supply, fill #1

## 2022-06-18 ENCOUNTER — Encounter: Payer: Medicare Other | Admitting: Internal Medicine

## 2022-06-18 ENCOUNTER — Encounter: Payer: Medicare Other | Admitting: Dietician

## 2022-06-20 ENCOUNTER — Other Ambulatory Visit: Payer: Self-pay | Admitting: Internal Medicine

## 2022-06-20 ENCOUNTER — Other Ambulatory Visit (HOSPITAL_COMMUNITY): Payer: Self-pay

## 2022-06-21 ENCOUNTER — Other Ambulatory Visit (HOSPITAL_COMMUNITY): Payer: Self-pay

## 2022-06-21 MED ORDER — CLOPIDOGREL BISULFATE 75 MG PO TABS
75.0000 mg | ORAL_TABLET | Freq: Every day | ORAL | 2 refills | Status: DC
  Filled 2022-06-21: qty 90, 90d supply, fill #0
  Filled 2022-09-23: qty 90, 90d supply, fill #1
  Filled 2022-12-22: qty 90, 90d supply, fill #2

## 2022-06-22 ENCOUNTER — Other Ambulatory Visit (HOSPITAL_COMMUNITY): Payer: Self-pay

## 2022-06-25 ENCOUNTER — Other Ambulatory Visit: Payer: Self-pay | Admitting: Internal Medicine

## 2022-06-25 DIAGNOSIS — E119 Type 2 diabetes mellitus without complications: Secondary | ICD-10-CM

## 2022-06-26 ENCOUNTER — Other Ambulatory Visit (HOSPITAL_COMMUNITY): Payer: Self-pay

## 2022-06-26 MED ORDER — OZEMPIC (0.25 OR 0.5 MG/DOSE) 2 MG/3ML ~~LOC~~ SOPN
0.5000 mg | PEN_INJECTOR | SUBCUTANEOUS | 0 refills | Status: DC
  Filled 2022-06-26: qty 3, 28d supply, fill #0

## 2022-06-29 ENCOUNTER — Other Ambulatory Visit (HOSPITAL_COMMUNITY): Payer: Self-pay

## 2022-07-02 ENCOUNTER — Other Ambulatory Visit (HOSPITAL_COMMUNITY): Payer: Self-pay

## 2022-07-03 ENCOUNTER — Encounter (HOSPITAL_COMMUNITY): Payer: Self-pay | Admitting: Surgery

## 2022-07-03 ENCOUNTER — Other Ambulatory Visit: Payer: Self-pay

## 2022-07-03 ENCOUNTER — Ambulatory Visit (HOSPITAL_COMMUNITY)
Admission: RE | Admit: 2022-07-03 | Discharge: 2022-07-03 | Disposition: A | Discharge status: Home / Self Care | Payer: Medicare Other | Source: Ambulatory Visit | Attending: Surgery | Admitting: Surgery

## 2022-07-03 ENCOUNTER — Encounter (HOSPITAL_COMMUNITY)
Admission: RE | Disposition: A | Discharge status: Home / Self Care | Payer: Self-pay | Source: Ambulatory Visit | Attending: Surgery

## 2022-07-03 DIAGNOSIS — E119 Type 2 diabetes mellitus without complications: Secondary | ICD-10-CM | POA: Insufficient documentation

## 2022-07-03 DIAGNOSIS — Z7985 Long-term (current) use of injectable non-insulin antidiabetic drugs: Secondary | ICD-10-CM | POA: Diagnosis not present

## 2022-07-03 DIAGNOSIS — E7800 Pure hypercholesterolemia, unspecified: Secondary | ICD-10-CM | POA: Insufficient documentation

## 2022-07-03 DIAGNOSIS — I10 Essential (primary) hypertension: Secondary | ICD-10-CM | POA: Diagnosis not present

## 2022-07-03 DIAGNOSIS — Z794 Long term (current) use of insulin: Secondary | ICD-10-CM | POA: Diagnosis not present

## 2022-07-03 DIAGNOSIS — N1832 Chronic kidney disease, stage 3b: Secondary | ICD-10-CM

## 2022-07-03 DIAGNOSIS — I701 Atherosclerosis of renal artery: Secondary | ICD-10-CM | POA: Diagnosis not present

## 2022-07-03 DIAGNOSIS — F17210 Nicotine dependence, cigarettes, uncomplicated: Secondary | ICD-10-CM | POA: Insufficient documentation

## 2022-07-03 HISTORY — PX: RENAL ANGIOGRAPHY: CATH118260

## 2022-07-03 HISTORY — PX: PERIPHERAL VASCULAR INTERVENTION: CATH118257

## 2022-07-03 LAB — PREGNANCY, URINE: Preg Test, Ur: NEGATIVE

## 2022-07-03 SURGERY — RENAL ANGIOGRAPHY
Anesthesia: LOCAL | Laterality: Right

## 2022-07-03 MED ORDER — SODIUM CHLORIDE 0.9 % IV SOLN: INTRAVENOUS | Status: DC

## 2022-07-03 MED ORDER — PROTAMINE SULFATE 10 MG/ML IV SOLN
INTRAVENOUS | Status: AC
  Filled 2022-07-03: qty 5

## 2022-07-03 MED ORDER — HEPARIN SODIUM (PORCINE) 1000 UNIT/ML IJ SOLN
INTRAMUSCULAR | Status: AC
  Filled 2022-07-03: qty 10

## 2022-07-03 MED ORDER — MORPHINE SULFATE (PF) 2 MG/ML IV SOLN: 2.0000 mg | INTRAVENOUS | Status: DC | PRN

## 2022-07-03 MED ORDER — LIDOCAINE HCL (PF) 1 % IJ SOLN
INTRAMUSCULAR | Status: AC
  Filled 2022-07-03: qty 30

## 2022-07-03 MED ORDER — MIDAZOLAM HCL 2 MG/2ML IJ SOLN
INTRAMUSCULAR | Status: AC
  Filled 2022-07-03: qty 2

## 2022-07-03 MED ORDER — ASPIRIN 81 MG PO TBEC: 81.0000 mg | DELAYED_RELEASE_TABLET | Freq: Every day | ORAL | Status: DC

## 2022-07-03 MED ORDER — IODIXANOL 320 MG/ML IV SOLN
INTRAVENOUS | Status: DC | PRN
  Administered 2022-07-03: 55 mL

## 2022-07-03 MED ORDER — FENTANYL CITRATE (PF) 100 MCG/2ML IJ SOLN
INTRAMUSCULAR | Status: DC | PRN
  Administered 2022-07-03: 50 ug via INTRAVENOUS
  Administered 2022-07-03: 25 ug via INTRAVENOUS

## 2022-07-03 MED ORDER — SODIUM CHLORIDE 0.9% FLUSH: 3.0000 mL | Freq: Two times a day (BID) | INTRAVENOUS | Status: DC

## 2022-07-03 MED ORDER — HEPARIN (PORCINE) IN NACL 1000-0.9 UT/500ML-% IV SOLN
INTRAVENOUS | Status: AC
  Filled 2022-07-03: qty 500

## 2022-07-03 MED ORDER — PROTAMINE SULFATE 10 MG/ML IV SOLN
INTRAVENOUS | Status: DC | PRN
  Administered 2022-07-03: 5 mg via INTRAVENOUS
  Administered 2022-07-03: 45 mg via INTRAVENOUS

## 2022-07-03 MED ORDER — HEPARIN (PORCINE) IN NACL 1000-0.9 UT/500ML-% IV SOLN
INTRAVENOUS | Status: DC | PRN
  Administered 2022-07-03 (×2): 500 mL

## 2022-07-03 MED ORDER — CLOPIDOGREL BISULFATE 75 MG PO TABS
75.0000 mg | ORAL_TABLET | Freq: Every day | ORAL | Status: DC
Start: 2022-07-04 — End: 2022-07-03

## 2022-07-03 MED ORDER — OXYCODONE HCL 5 MG PO TABS: 5.0000 mg | ORAL_TABLET | ORAL | Status: DC | PRN

## 2022-07-03 MED ORDER — HEPARIN (PORCINE) IN NACL 1000-0.9 UT/500ML-% IV SOLN
INTRAVENOUS | Status: AC
Start: 2022-07-03 — End: ?
  Filled 2022-07-03: qty 500

## 2022-07-03 MED ORDER — LABETALOL HCL 5 MG/ML IV SOLN: 10.0000 mg | INTRAVENOUS | Status: DC | PRN

## 2022-07-03 MED ORDER — ONDANSETRON HCL 4 MG/2ML IJ SOLN: 4.0000 mg | Freq: Four times a day (QID) | INTRAMUSCULAR | Status: DC | PRN

## 2022-07-03 MED ORDER — MIDAZOLAM HCL 2 MG/2ML IJ SOLN
INTRAMUSCULAR | Status: DC | PRN
  Administered 2022-07-03: 2 mg via INTRAVENOUS

## 2022-07-03 MED ORDER — HYDRALAZINE HCL 20 MG/ML IJ SOLN: 5.0000 mg | INTRAMUSCULAR | Status: DC | PRN

## 2022-07-03 MED ORDER — ACETAMINOPHEN 325 MG PO TABS: 650.0000 mg | ORAL_TABLET | ORAL | Status: DC | PRN

## 2022-07-03 MED ORDER — LIDOCAINE HCL (PF) 1 % IJ SOLN
INTRAMUSCULAR | Status: DC | PRN
  Administered 2022-07-03: 15 mL

## 2022-07-03 MED ORDER — CLOPIDOGREL BISULFATE 75 MG PO TABS: 75.0000 mg | ORAL_TABLET | Freq: Every day | ORAL | Status: DC

## 2022-07-03 MED ORDER — HEPARIN SODIUM (PORCINE) 1000 UNIT/ML IJ SOLN
INTRAMUSCULAR | Status: DC | PRN
  Administered 2022-07-03: 10000 [IU] via INTRAVENOUS

## 2022-07-03 MED ORDER — FENTANYL CITRATE (PF) 100 MCG/2ML IJ SOLN
INTRAMUSCULAR | Status: AC
  Filled 2022-07-03: qty 2

## 2022-07-03 MED ORDER — SODIUM CHLORIDE 0.9% FLUSH: 3.0000 mL | INTRAVENOUS | Status: DC | PRN

## 2022-07-03 MED ORDER — SODIUM CHLORIDE 0.9 % IV SOLN: 250.0000 mL | INTRAVENOUS | Status: DC | PRN

## 2022-07-03 SURGICAL SUPPLY — 15 items
CATH OMNI FLUSH 5F 65CM (CATHETERS) ×1 IMPLANT
GUIDE CATH VISTA IMA 6F (CATHETERS) ×1 IMPLANT
KIT ENCORE 26 ADVANTAGE (KITS) ×1 IMPLANT
KIT MICROPUNCTURE NIT STIFF (SHEATH) ×1 IMPLANT
KIT PV (KITS) ×3 IMPLANT
SHEATH PINNACLE 6F 10CM (SHEATH) ×1 IMPLANT
SHEATH PROBE COVER 6X72 (BAG) ×1 IMPLANT
STENT HERCULINK RX 6.0X12X135 (Permanent Stent) ×1 IMPLANT
STOPCOCK MORSE 400PSI 3WAY (MISCELLANEOUS) ×1 IMPLANT
SYR MEDRAD MARK V 150ML (SYRINGE) ×1 IMPLANT
TRANSDUCER W/STOPCOCK (MISCELLANEOUS) ×3 IMPLANT
TRAY PV CATH (CUSTOM PROCEDURE TRAY) ×3 IMPLANT
TUBING CIL FLEX 10 FLL-RA (TUBING) ×1 IMPLANT
WIRE BENTSON .035X145CM (WIRE) ×1 IMPLANT
WIRE STABILIZER XS .014X180CM (WIRE) ×1 IMPLANT

## 2022-07-03 NOTE — H&P (Signed)
 [facility] [place]  [name] MRN: [id number] DOB: 02/28/1971 Sex: female   REQUESTING PROVIDER:    Dr. Lau   REASON FOR CONSULT:    Renal artery stenosis  HISTORY OF PRESENT ILLNESS:   Kendra Vega is a 50 y.o. female, who is referred for evaluation of renal artery stenosis in the setting of difficult to control hypertension.  She is on 3 medications for hypertension.  Patient suffers from type 2 diabetes, on insulin.  She has a history of central retinal artery occlusion on the right.  She is a current smoker.  She takes a statin for hypercholesterolemia.  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   Breast mass    rt breast ?abcess x's 1 week   Chronic kidney disease    Dental caries    Depression    GERD (gastroesophageal reflux disease)    Hypertension    Otitis externa of right ear 01/24/2022   Stroke (HCC)    Type 2 diabetes mellitus without complication, without long-term current use of insulin (HCC) 10/21/2018   WARTS, HAND 01/19/2011   Qualifier: Diagnosis of  By: Martin FNP, [place]       FAMILY HISTORY   Family History  Problem Relation Age of Onset   Kidney disease Mother    Diabetes Mother    Hypertension Mother    Hypertension Sister    Hypertension Sister    Hypertension Sister    Heart attack Son     SOCIAL HISTORY:   Social History   Socioeconomic History   Marital status: Significant Other    Spouse name: Not on file   Number of children: 1   Years of education: Not on file   Highest education level: 10th grade  Occupational History   Not on file  Tobacco Use   Smoking status: Every Day    Packs/day: 0.20    Years: 7.00    Total pack years: 1.40    Types: Cigarettes   Smokeless tobacco: Never   Tobacco comments:    smokes 2-3 daily " Black and Milds "    Vaping Use   Vaping Use: Never used  Substance and Sexual Activity   Alcohol use: Not Currently    Comment: rare glass of wine   Drug use: Yes    Frequency: 1.0 times per week    Types: Marijuana    Comment: occasional marijuana    Sexual activity: Not Currently    Partners: Male    Birth control/protection: None  Other Topics Concern   Not on file  Social History Narrative   Current Social History 06/21/2021        Patient lives with a significant other in an apartment which is 1 story/stories. There are not steps up to the entrance the patient uses.       Patient's method of transportation is via family member or public transportation if needed.      The highest level of education was some high school.      The patient currently disabled but would like part time job.       Identified important Relationships are "my dogs"      Pets : 2       Interests / Fun: "I love to shop"       Current   apartment which is 1 story/stories. There are not steps up to the entrance the patient uses.          Patient's method of transportation is via family member or public transportation if needed.         The highest level of education was some high school.         The patient currently disabled but would like part time job.          Identified important Relationships are "my dogs"         Pets : 2         Interests / Fun: "I love to shop"          Current Stressors: son's passing and her bills         Religious / Personal Beliefs: "I just believe in Jesus Christ"          Social Determinants of Health    Financial Resource Strain: Not on file  Food Insecurity: Not on file  Transportation Needs: Not on file  Physical Activity: Not on file  Stress: Not on file  Social Connections: Not on file  Intimate Partner Violence: Not on file      ALLERGIES:      No Known Allergies   CURRENT MEDICATIONS:            Current Outpatient Medications  Medication Sig Dispense Refill   acetaminophen (TYLENOL) 500 MG tablet Take 1,000 mg by mouth every 8 (eight) hours as needed for moderate pain.       amLODipine (NORVASC) 10 MG tablet Take 1 tablet (10 mg total) by mouth daily. 90 tablet 3   aspirin EC 81 MG tablet Take 81 mg by mouth daily. Swallow whole.       atorvastatin (LIPITOR) 40 MG tablet Take 1 tablet (40  mg total) by mouth daily. 90 tablet 3   Biotin 35009 MCG TABS Take 1 tablet by mouth daily.       Blood Glucose Monitoring Suppl (TRUE METRIX METER) DEVI 1 each by Does not apply route daily. 1 Device 0   busPIRone (BUSPAR) 10 MG tablet Take 10 mg by mouth 3 (three) times daily as needed.       clopidogrel (PLAVIX) 75 MG tablet Take 1 tablet (75 mg total) by mouth daily. 90 tablet 2   ELDERBERRY PO Take by mouth.       FLUoxetine (PROZAC) 40 MG capsule Take 1 capsule (40 mg total) by mouth daily. 90 capsule 2   furosemide (LASIX) 80 MG tablet Take 1 tablet (80 mg total) by mouth daily. 90 tablet 1   glucose blood (TRUE METRIX BLOOD GLUCOSE TEST) test strip Use once daily 100 each 12   hydrOXYzine (ATARAX/VISTARIL) 25 MG tablet Take 1 tablet (25 mg total) by mouth 3 (three) times daily. 90 tablet 6   labetalol (NORMODYNE) 200 MG tablet Take 1 tablet (200 mg total) by mouth 2 (two) times daily. 60 tablet 2   lidocaine (LIDODERM) 5 % Place 1 patch onto the skin daily. Remove & Discard patch within 12 hours or as directed by MD 20 patch 0   lisinopril (ZESTRIL) 40 MG tablet Take 1 tablet (40 mg total) by mouth daily. 30 tablet 11   nicotine polacrilex (NICORETTE) 4 MG gum Take 1 each (4 mg total) by mouth as needed for smoking cessation. 100 tablet 0   Omega-3 Fatty Acids (FISH OIL) 1000 MG CAPS Take 1,000 mg by mouth  daily.       pantoprazole (PROTONIX) 40 MG tablet Take 1 tablet (40 mg total) by mouth daily. 90 tablet 3   QUEtiapine (SEROQUEL XR) 300 MG 24 hr tablet Take 300 mg by mouth at bedtime.       Semaglutide,0.25 or 0.5MG /DOS, 2 MG/3ML SOPN Inject 0.5 mg into the skin once a week. 3 mL 0   TRUEPLUS LANCETS 28G MISC 1 each by Does not apply route daily. 100 each 12    No current facility-administered medications for this visit.      REVIEW OF SYSTEMS:    [X]  denotes positive finding, [ ]  denotes negative finding Cardiac   Comments:  Chest pain or chest pressure:      Shortness of  breath upon exertion:      Short of breath when lying flat:      Irregular heart rhythm:             Vascular      Pain in calf, thigh, or hip brought on by ambulation:      Pain in feet at night that wakes you up from your sleep:       Blood clot in your veins:      Leg swelling:              Pulmonary      Oxygen at home:      Productive cough:       Wheezing:              Neurologic      Sudden weakness in arms or legs:       Sudden numbness in arms or legs:       Sudden onset of difficulty speaking or slurred speech:      Temporary loss of vision in one eye:       Problems with dizziness:              Gastrointestinal      Blood in stool:         Vomited blood:              Genitourinary      Burning when urinating:       Blood in urine:             Psychiatric      Major depression:              Hematologic      Bleeding problems:      Problems with blood clotting too easily:             Skin      Rashes or ulcers:             Constitutional      Fever or chills:        PHYSICAL EXAM:       Vitals:    05/28/22 0827  BP: 136/90  Pulse: 74  Resp: 20  Temp: 97.9 F (36.6 C)  SpO2: 97%  Weight: 229 lb (103.9 kg)  Height: 5\' 5"  (1.651 m)      GENERAL: The patient is a well-nourished female, in no acute distress. The vital signs are documented above. CARDIAC: There is a regular rate and rhythm.  PULMONARY: Nonlabored respirations ABDOMEN: Soft and non-tender with no abdominal bruits  MUSCULOSKELETAL: There are no major deformities or cyanosis. NEUROLOGIC: No focal weakness or paresthesias are detected. SKIN: There are no ulcers or rashes noted. PSYCHIATRIC: The  patient has a normal affect.   STUDIES:    I reviewed the following ultrasound study: Renal:     Right: Normal size right kidney. Normal right Resisitive Index. No         evidence of right renal artery stenosis. RRV flow present.  Left:  Normal size of left kidney. Normal left  Resistive Index.         Upper range 1-59% stenosis of the left renal artery. LRV flow         present.       ASSESSMENT and PLAN    Renal artery stenosis: The patient has had 3 strokes and is now on 3 blood pressure medications.  She states her blood pressure has been difficult to control, especially her diastolic component which is usually over 90.  She has an ultrasound from December that shows possible significant left renal artery stenosis with normal resistive indices and normal kidney size.  I propose 2 options.  The first would be to continue with medication titration to see if we can optimize her blood pressure.  The second would be proceeding with angiography to definitively determine whether or not she has a significant left renal artery stenosis.  If she does, I would proceed with stenting.  I discussed that this has the potential to improve her blood pressure, but that is not guaranteed, as people have different responses to renal artery stenting.  We have agreed to proceed with renal artery angiography and stenting if a significant stenosis is identified.  We also discussed the importance of smoking cessation.     Leia Alf, MD, FACS [facility] [phone] Pager [phone]

## 2022-07-03 NOTE — Op Note (Signed)
    [name] MRN: [id number] DOB: November 07, 1971 Sex: female  07/03/2022 Pre-operative Diagnosis: Renal artery stenosis Post-operative diagnosis:  Same Surgeon:  Durene Cal Procedure Performed:  1.  Ultrasound-guided access, right femoral artery  2.  Abdominal aortogram  3.  Stent, left renal artery  4.  Conscious sedation, 31 minutes   Indications: This is a 51 year old female with refractory hypertension and ultrasound suggesting renal artery stenosis.  She is here for further evaluation.  Procedure:  The patient was identified in the holding area and taken to room 8.  The patient was then placed supine on the table and prepped and draped in the usual sterile fashion.  A time out was called.  Conscious sedation was administered with the use of IV fentanyl and Versed under continuous physician and nurse monitoring.  Heart rate, blood pressure, and oxygen saturation were continuously monitored.  Total sedation time was 31 minutes.  Ultrasound was used to evaluate the right common femoral artery.  It was patent .  A digital ultrasound image was acquired.  A micropuncture needle was used to access the right common femoral artery under ultrasound guidance.  An 018 wire was advanced without resistance and a micropuncture sheath was placed.  The 018 wire was removed and a benson wire was placed.  The micropuncture sheath was exchanged for a 6 french sheath.  An omniflush catheter was advanced over the wire to the level of L-1.  An abdominal angiogram was obtained. Findings:   Aortogram: Paired right renal arteries with less than 50% stenosis.  There are paired left renal arteries with a dominant main artery that has a greater than 60-70% stenosis.  The infrarenal abdominal aorta is widely patent.  Bilateral common and external iliac arteries are widely patent without stenosis.   Intervention: After the above images were acquired the decision made to proceed with intervention.  A  IM guide catheter was inserted and a stabilizer wire was used to cannulate the left main renal artery.  Selective injection was performed confirming the stenosis.  The patient was then fully heparinized.  I then selected a 6 x 12 Herculink stent and deployed this across the lesion with no residual stenosis.  Catheter and wire were removed.  I intended to use a Celt for closure, however the device was pulled out of the artery before deployment of the initial disc and so a manual pressure was held for hemostasis.  50 mg of protamine were given.  Impression:  #1  60-70% left renal artery stenosis successfully treated using a 6 x 12 Herculink stent with no residual stenosis  #2  2 right renal arteries with less than 50% stenosis  #3  No significant infrarenal aortoiliac occlusive disease    V. Durene Cal, M.D., [facility] [place] Office: [phone] Pager:  [phone]

## 2022-07-09 ENCOUNTER — Other Ambulatory Visit: Payer: Self-pay

## 2022-07-09 ENCOUNTER — Encounter: Payer: Self-pay | Admitting: Internal Medicine

## 2022-07-09 ENCOUNTER — Ambulatory Visit (INDEPENDENT_AMBULATORY_CARE_PROVIDER_SITE_OTHER): Payer: Medicare Other | Admitting: Internal Medicine

## 2022-07-09 ENCOUNTER — Other Ambulatory Visit (HOSPITAL_COMMUNITY): Payer: Self-pay

## 2022-07-09 ENCOUNTER — Encounter: Payer: Medicare Other | Admitting: Dietician

## 2022-07-09 VITALS — BP 128/73 | HR 76 | Temp 98.0°F | Ht 65.0 in | Wt 232.0 lb

## 2022-07-09 DIAGNOSIS — F17200 Nicotine dependence, unspecified, uncomplicated: Secondary | ICD-10-CM

## 2022-07-09 DIAGNOSIS — R5383 Other fatigue: Secondary | ICD-10-CM | POA: Diagnosis not present

## 2022-07-09 DIAGNOSIS — Z0000 Encounter for general adult medical examination without abnormal findings: Secondary | ICD-10-CM

## 2022-07-09 DIAGNOSIS — E119 Type 2 diabetes mellitus without complications: Secondary | ICD-10-CM | POA: Diagnosis not present

## 2022-07-09 DIAGNOSIS — L309 Dermatitis, unspecified: Secondary | ICD-10-CM | POA: Diagnosis not present

## 2022-07-09 DIAGNOSIS — F17210 Nicotine dependence, cigarettes, uncomplicated: Secondary | ICD-10-CM

## 2022-07-09 DIAGNOSIS — I10 Essential (primary) hypertension: Secondary | ICD-10-CM

## 2022-07-09 DIAGNOSIS — F418 Other specified anxiety disorders: Secondary | ICD-10-CM

## 2022-07-09 DIAGNOSIS — E785 Hyperlipidemia, unspecified: Secondary | ICD-10-CM

## 2022-07-09 LAB — GLUCOSE, CAPILLARY: Glucose-Capillary: 115 mg/dL — ABNORMAL HIGH (ref 70–99)

## 2022-07-09 LAB — POCT GLYCOSYLATED HEMOGLOBIN (HGB A1C): Hemoglobin A1C: 5.3 % (ref 4.0–5.6)

## 2022-07-09 MED ORDER — TRIAMCINOLONE ACETONIDE 0.025 % EX OINT
1.0000 | TOPICAL_OINTMENT | Freq: Two times a day (BID) | CUTANEOUS | 0 refills | Status: DC | PRN
  Filled 2022-07-09: qty 30, 15d supply, fill #0
  Filled 2022-07-20: qty 30, 7d supply, fill #0

## 2022-07-09 MED ORDER — ONETOUCH DELICA PLUS LANCET33G MISC
Freq: Four times a day (QID) | 0 refills | Status: AC
  Filled 2022-07-09 – 2022-07-20 (×2): qty 100, 25d supply, fill #0

## 2022-07-09 MED ORDER — GLUCOSE BLOOD VI STRP
ORAL_STRIP | Freq: Four times a day (QID) | 0 refills | Status: DC
  Filled 2022-07-09 – 2022-07-20 (×2): qty 100, 25d supply, fill #0

## 2022-07-09 MED ORDER — BLOOD GLUCOSE METER KIT
PACK | Freq: Four times a day (QID) | 0 refills | Status: DC
  Filled 2022-07-09: qty 1, 1d supply, fill #0
  Filled 2022-07-20: qty 1, 90d supply, fill #0

## 2022-07-09 NOTE — Patient Instructions (Addendum)
It was wonderful to see you today!  For low energy:  -Please start taking your fluoxetine (Prozac) daily. -We will check labs today, I will call you with results. -I have sent a new referral for a sleep study in [place]. This is very important to get done!  Please call the [facility] to ask how much the nicotine gum will cost. If it is too expensive there, you may look around to buy it over the counter.   Please call neurology to schedule follow-up appointment at: [phone].  Please return your stool kit to clinic for colon cancer screening!

## 2022-07-09 NOTE — Assessment & Plan Note (Signed)
Blood pressure improved today at 128/73. Kendra Vega underwent left renal artery stenting last week, 07/03/22, and reports feeling well since that time. She has had some bruising and soreness around right femoral access site but improving. Discussed Tylenol tid for discomfort. Results from this study as below: "Impression:             #1  60-70% left renal artery stenosis successfully treated using a 6 x 12 Herculink stent with no residual stenosis             #2  2 right renal arteries with less than 50% stenosis             #3  No significant infrarenal aortoiliac occlusive disease"  She continues on amlodipine 10 mg, labetalol 200 bid, lisinopril 40 mg, and furosemide 80 mg as well. She has not been able to undergo sleep study given transportation difficulty outside of [place], will resend local referral.   Plan: -Continue lisinopril 40 mg daily, amlopidine 10 mg daily, labetalol 200 bid, and furosemide 80 mg daily -F/u with vascular surgery s/p left renal artery stent 07/2022 -Continued tobacco cessation counseling, recommend she check with pharmacy re: NRT price -Referral for sleep resent

## 2022-07-09 NOTE — Assessment & Plan Note (Signed)
Kendra Vega has not turned in FIT kit, we discussed the importance of this today and she will attempt to bring to next visit. Discussed updating Pap screening today, however Kendra Vega declines given soreness at recent stenting site. Will plan for Pap in one month.

## 2022-07-09 NOTE — Assessment & Plan Note (Signed)
PHQ-9 7 from 15 at last visit. Kendra Vega reports intermittent low energy and fatigue which she feels is 2/2 depression. When she has plans for the day, such as going to work, she does not feel low energy. She has stopped taking her Prozac as she thought it made her feel funny, however she is going to resume taking this to see if it helps her symptoms. I encouraged her to do so and call if noticing adverse effects or worsening mood/fatigue. Continues on buspirone prn. Will check CBC/TSH today as well. Continues to follow with [facility] for counseling.

## 2022-07-09 NOTE — Assessment & Plan Note (Addendum)
Continues on GLP1-RA without adverse effect. A1c remains well within goal, 5.3%. Kendra Vega has lost about 14 pounds since May. She is increasing water intake, decreasing sweetened beverages. Continues to drink 2 cranberry juices/day. She is meeting with Kendra Vega today for further nutrition counseling. She is planning to call Kendra Vega office for f/u eye exam. She notes her glucometer is very old and she does not have supplies. Will send new prescription for meter and supplies.

## 2022-07-09 NOTE — Assessment & Plan Note (Signed)
Continues on atorvastatin 40 mg daily, due for annual repeat lipid panel. LDL goal <70 given history of stroke.

## 2022-07-09 NOTE — Progress Notes (Signed)
Established Patient Office Visit  Subjective   Patient ID: Kendra Vega, female    DOB: 23-Jan-1971  Age: 51 y.o. MRN: [id number]  Chief Complaint  Patient presents with   Follow-up    Diabetes    Kendra Vega returns to clinic today for follow-up of chronic conditions including hypertension and diabetes, as well as to discuss ongoing tobacco use, depression, fatigue, and skin concerns. Please see problem-based charting for full details of today's visit.    Patient Active Problem List   Diagnosis Date Noted   Eczema 07/09/2022   Fatigue 07/09/2022   Venous insufficiency of left leg 04/30/2022   Screening for malignant neoplasm of colon 04/30/2022   Chronic bilateral low back pain without sciatica 04/03/2022   Healthcare maintenance 09/20/2020   Restless leg 03/15/2020   Grief at loss of child 06/30/2019   Type 2 diabetes mellitus without complication, without long-term current use of insulin ([place]) 10/21/2018   Muscle cramps 07/25/2018   Headache 10/22/2017   CRAO (central retinal artery occlusion), right    HLD (hyperlipidemia) 08/03/2017   History of CVA (cerebrovascular accident) 10/28/2016   CKD (chronic kidney disease), stage III ([place]) 10/28/2016   OBESITY 01/19/2011   GASTROESOPHAGEAL REFLUX DISEASE 08/10/2010   Bipolar depression ([place]) 08/10/2010   Depression with anxiety 06/27/2010   Smoker 06/27/2010   VISUAL ACUITY, DECREASED, RIGHT EYE 06/07/2010   Resistant hypertension 06/07/2010     Objective:     BP 128/73 (BP Location: Left Arm, Patient Position: Sitting, Cuff Size: Normal)   Pulse 76   Temp 98 F (36.7 C) (Oral)   Ht '5\' 5"'  (1.651 m)   Wt 232 lb (105.2 kg)   SpO2 100% Comment: RA  BMI 38.61 kg/m  BP Readings from Last 3 Encounters:  07/09/22 128/73  07/03/22 (!) 154/90  05/28/22 136/90   Wt Readings from Last 3 Encounters:  07/09/22 232 lb (105.2 kg)  07/03/22 230 lb (104.3 kg)  05/28/22 229 lb (103.9 kg)    Physical  Exam Constitutional:      General: She is not in acute distress.    Appearance: Normal appearance.  Cardiovascular:     Rate and Rhythm: Normal rate and regular rhythm.     Heart sounds: Normal heart sounds.  Pulmonary:     Effort: Pulmonary effort is normal.     Breath sounds: Normal breath sounds.  Musculoskeletal:     Right lower leg: No edema.     Left lower leg: No edema.  Skin:    Comments: Dry plaque over lower abdomen  Neurological:     Mental Status: She is alert. Mental status is at baseline.  Psychiatric:        Mood and Affect: Mood normal.        Behavior: Behavior normal.     Results for orders placed or performed in visit on 07/09/22  Glucose, capillary  Result Value Ref Range   Glucose-Capillary 115 (H) 70 - 99 mg/dL  POC Hbg A1C  Result Value Ref Range   Hemoglobin A1C 5.3 4.0 - 5.6 %   HbA1c POC (<> result, manual entry)     HbA1c, POC (prediabetic range)     HbA1c, POC (controlled diabetic range)       Assessment & Plan:   Problem List Items Addressed This Visit       Cardiovascular and Mediastinum   Resistant hypertension    Blood pressure improved today at 128/73. Kendra Vega underwent left renal artery  stenting last week, 07/03/22, and reports feeling well since that time. She has had some bruising and soreness around right femoral access site but improving. Discussed Tylenol tid for discomfort. Results from this study as below: "Impression:             #1  60-70% left renal artery stenosis successfully treated using a 6 x 12 Herculink stent with no residual stenosis             #2  2 right renal arteries with less than 50% stenosis             #3  No significant infrarenal aortoiliac occlusive disease"  She continues on amlodipine 10 mg, labetalol 200 bid, lisinopril 40 mg, and furosemide 80 mg as well. She has not been able to undergo sleep study given transportation difficulty outside of [place], will resend local referral.    Plan: -Continue lisinopril 40 mg daily, amlopidine 10 mg daily, labetalol 200 bid, and furosemide 80 mg daily -F/u with vascular surgery s/p left renal artery stent 07/2022 -Continued tobacco cessation counseling, recommend she check with pharmacy re: NRT price -Referral for sleep resent      Relevant Orders   Ambulatory referral to Sleep Studies     Endocrine   Type 2 diabetes mellitus without complication, without long-term current use of insulin ([place]) - Primary    Continues on GLP1-RA without adverse effect. A1c remains well within goal, 5.3%. Kendra Vega has lost about 14 pounds since May. She is increasing water intake, decreasing sweetened beverages. Continues to drink 2 cranberry juices/day. She is meeting with [facility] today for further nutrition counseling. She is planning to call Dr. Kellie Moor office for f/u eye exam. She notes her glucometer is very old and she does not have supplies. Will send new prescription for meter and supplies.      Relevant Orders   POC Hbg A1C (Completed)     Musculoskeletal and Integument   Eczema    Pruritic, dry plaque over lower abdomen concerning for eczema. Kendra Vega reports a history of eczematous areas on her back as well. We discussed avoidance of drying agents such as alcohol swabs and have prescribed corticosteroid ointment as needed for flares.      Relevant Medications   triamcinolone (KENALOG) 0.025 % ointment     Other   Depression with anxiety    PHQ-9 7 from 15 at last visit. Kendra Vega reports intermittent low energy and fatigue which she feels is 2/2 depression. When she has plans for the day, such as going to work, she does not feel low energy. She has stopped taking her Prozac as she thought it made her feel funny, however she is going to resume taking this to see if it helps her symptoms. I encouraged her to do so and call if noticing adverse effects or worsening mood/fatigue. Continues on buspirone prn. Will check CBC/TSH  today as well. Continues to follow with [facility] for counseling.      Smoker    Continues to smoke. She was unable to pick up the nicotine gum prescribed at last visit as she was notified it was not covered by insurance. We discussed asking the pharmacy for OTC cost and asking other pharmacies for acceptable price. She will attempt to do so.      HLD (hyperlipidemia)    Continues on atorvastatin 40 mg daily, due for annual repeat lipid panel. LDL goal <70 given history of stroke.  Relevant Orders   Lipid Profile   Healthcare maintenance    Ms. Sant has not turned in [employer], we discussed the importance of this today and she will attempt to bring to next visit. Discussed updating Pap screening today, however Ms. Homeyer declines given soreness at recent stenting site. Will plan for Pap in one month.      Fatigue   Relevant Orders   CBC with Diff   TSH + free T4    Return in about 4 weeks (around 08/06/2022) for Pap.    Charise Killian, MD

## 2022-07-09 NOTE — Assessment & Plan Note (Signed)
Pruritic, dry plaque over lower abdomen concerning for eczema. Kendra Vega reports a history of eczematous areas on her back as well. We discussed avoidance of drying agents such as alcohol swabs and have prescribed corticosteroid ointment as needed for flares.

## 2022-07-09 NOTE — Assessment & Plan Note (Signed)
Continues to smoke. She was unable to pick up the nicotine gum prescribed at last visit as she was notified it was not covered by insurance. We discussed asking the pharmacy for OTC cost and asking other pharmacies for acceptable price. She will attempt to do so.

## 2022-07-10 LAB — LIPID PANEL
Chol/HDL Ratio: 3.3 ratio (ref 0.0–4.4)
Cholesterol, Total: 123 mg/dL (ref 100–199)
HDL: 37 mg/dL — ABNORMAL LOW (ref >39)
LDL Chol Calc (NIH): 68 mg/dL (ref 0–99)
Triglycerides: 95 mg/dL (ref 0–149)
VLDL Cholesterol Cal: 18 mg/dL (ref 5–40)

## 2022-07-10 LAB — CBC WITH DIFFERENTIAL/PLATELET
Basophils Absolute: 0.1 10*3/uL (ref 0.0–0.2)
Basos: 1 %
EOS (ABSOLUTE): 0.3 10*3/uL (ref 0.0–0.4)
Eos: 3 %
Hematocrit: 38 % (ref 34.0–46.6)
Hemoglobin: 12.1 g/dL (ref 11.1–15.9)
Immature Grans (Abs): 0 10*3/uL (ref 0.0–0.1)
Immature Granulocytes: 0 %
Lymphocytes Absolute: 3.9 10*3/uL — ABNORMAL HIGH (ref 0.7–3.1)
Lymphs: 39 %
MCH: 25.5 pg — ABNORMAL LOW (ref 26.6–33.0)
MCHC: 31.8 g/dL (ref 31.5–35.7)
MCV: 80 fL (ref 79–97)
Monocytes Absolute: 0.5 10*3/uL (ref 0.1–0.9)
Monocytes: 5 %
Neutrophils Absolute: 5.2 10*3/uL (ref 1.4–7.0)
Neutrophils: 52 %
Platelets: 337 10*3/uL (ref 150–450)
RBC: 4.74 x10E6/uL (ref 3.77–5.28)
RDW: 16.1 % — ABNORMAL HIGH (ref 11.7–15.4)
WBC: 9.9 10*3/uL (ref 3.4–10.8)

## 2022-07-10 LAB — TSH+FREE T4
Free T4: 1.13 ng/dL (ref 0.82–1.77)
TSH: 3.17 u[IU]/mL (ref 0.450–4.500)

## 2022-07-12 ENCOUNTER — Telehealth: Payer: Self-pay | Admitting: Internal Medicine

## 2022-07-12 NOTE — Progress Notes (Signed)
Improved triglycerides and LDL at goal <70. Continue statin therapy.

## 2022-07-12 NOTE — Progress Notes (Signed)
Normal thyroid studies

## 2022-07-12 NOTE — Progress Notes (Signed)
No evidence of anemia, normal WBC and platelets. Persistent mild elevation in ALC, although not consistent with lymphocytosis (>4000). Will continue to monitor.

## 2022-07-12 NOTE — Telephone Encounter (Signed)
Reviewed lab results with Ms. Mcniel 07/12/22.

## 2022-07-17 ENCOUNTER — Other Ambulatory Visit (HOSPITAL_COMMUNITY): Payer: Self-pay

## 2022-07-20 ENCOUNTER — Other Ambulatory Visit (HOSPITAL_COMMUNITY): Payer: Self-pay

## 2022-07-24 ENCOUNTER — Other Ambulatory Visit: Payer: Self-pay | Admitting: *Deleted

## 2022-07-24 DIAGNOSIS — I701 Atherosclerosis of renal artery: Secondary | ICD-10-CM

## 2022-08-01 ENCOUNTER — Other Ambulatory Visit (HOSPITAL_COMMUNITY): Payer: Self-pay

## 2022-08-02 ENCOUNTER — Other Ambulatory Visit (HOSPITAL_COMMUNITY): Payer: Self-pay

## 2022-08-06 ENCOUNTER — Ambulatory Visit (HOSPITAL_COMMUNITY)
Admission: RE | Admit: 2022-08-06 | Discharge: 2022-08-06 | Disposition: A | Discharge status: Home / Self Care | Payer: Medicare Other | Source: Ambulatory Visit | Attending: Surgery | Admitting: Surgery

## 2022-08-06 ENCOUNTER — Encounter (HOSPITAL_COMMUNITY): Payer: Medicare Other

## 2022-08-06 ENCOUNTER — Other Ambulatory Visit: Payer: Self-pay | Admitting: Internal Medicine

## 2022-08-06 ENCOUNTER — Encounter: Payer: Self-pay | Admitting: Surgery

## 2022-08-06 ENCOUNTER — Encounter: Payer: Medicare Other | Admitting: Surgery

## 2022-08-06 ENCOUNTER — Ambulatory Visit (INDEPENDENT_AMBULATORY_CARE_PROVIDER_SITE_OTHER): Payer: Medicare Other | Admitting: Surgery

## 2022-08-06 VITALS — BP 141/81 | HR 72 | Temp 98.1°F | Resp 20 | Ht 65.0 in | Wt 223.5 lb

## 2022-08-06 DIAGNOSIS — I701 Atherosclerosis of renal artery: Secondary | ICD-10-CM

## 2022-08-06 DIAGNOSIS — E119 Type 2 diabetes mellitus without complications: Secondary | ICD-10-CM

## 2022-08-06 NOTE — Progress Notes (Signed)
[facility] [place]  [name] MRN: [id number] DOB: [birth year] Sex: female   REASON FOR VISIT:    Follow up   HISOTRY OF PRESENT ILLNESS:    Kendra Vega is a 51 y.o. female, who I saw for evaluation of left renal artery stenosis in the setting of difficult to control hypertension.  She is on 3 medications for hypertension.  She underwent angiogram on 06-17-2022, and was found to have a 60-70% left renal stenosis which was stented.  She is back today for follow-up.  She states that her blood pressure has been under better control but has not had any changes in her medication.  Patient suffers from type 2 diabetes, on insulin.  She has a history of central retinal artery occlusion on the right.  She is a current smoker.  She takes a statin for hypercholesterolemia.     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   Breast mass    rt breast ?abcess x's 1 week   Chronic kidney disease    Dental caries    Depression    GERD (gastroesophageal reflux disease)    Hypertension    Otitis externa of right ear 01/24/2022   Stroke ([place])    Type 2 diabetes mellitus without complication, without long-term current use of insulin ([place]) 10/21/2018   WARTS, HAND 01/19/2011   Qualifier: Diagnosis of  By: Hassell Done FNP, Tori Milks       FAMILY HISTORY:   Family History  Problem Relation Age of Onset   Kidney disease Mother    Diabetes Mother    Hypertension Mother    Hypertension Sister    Hypertension Sister    Hypertension Sister    Heart attack Son     SOCIAL HISTORY:   Social History   Tobacco Use   Smoking status: Every Day    Packs/day: 0.20    Years: 7.00    Total pack years: 1.40    Types: Cigarettes   Smokeless tobacco: Never   Tobacco comments:    smokes 2-3 daily " Black and Milds "   Substance  Use Topics   Alcohol use: Not Currently    Comment: rare glass of wine     ALLERGIES:   No Known Allergies   CURRENT MEDICATIONS:   Current Outpatient Medications  Medication Sig Dispense Refill   acetaminophen (TYLENOL) 500 MG tablet Take 1,000 mg by mouth every 8 (eight) hours as needed for moderate pain.     amLODipine (NORVASC) 10 MG tablet Take 1 tablet (10 mg total) by mouth daily. 90 tablet 3   aspirin EC 81 MG tablet Take 81 mg by mouth daily. Swallow whole.     atorvastatin (LIPITOR) 40 MG tablet Take 1 tablet (40 mg total) by mouth daily. 90 tablet 3   Biotin 10000 MCG TABS Take 1 tablet by mouth daily.     blood glucose meter kit and supplies Use up to 4 (four) times daily. 1 each 0   busPIRone (BUSPAR) 10 MG tablet Take 10 mg by mouth 3 (three) times daily as needed.     clopidogrel (PLAVIX) 75 MG tablet Take 1 tablet (75 mg total) by mouth daily. 90 tablet 2   FLUoxetine (PROZAC) 40 MG capsule Take 1 capsule (40 mg total) by mouth daily. 90 capsule 2  furosemide (LASIX) 80 MG tablet Take 1 tablet (80 mg total) by mouth daily. 90 tablet 1   glucose blood test strip Use up to 4 (four) times daily to check blood sugar. 100 each 0   hydrOXYzine (ATARAX/VISTARIL) 25 MG tablet Take 1 tablet (25 mg total) by mouth 3 (three) times daily. 90 tablet 6   labetalol (NORMODYNE) 200 MG tablet Take 1 tablet (200 mg total) by mouth 2 (two) times daily. 60 tablet 2   Lancets (ONETOUCH DELICA PLUS [id number]) MISC Use up to 4 (four) times daily. 100 each 0   lisinopril (ZESTRIL) 40 MG tablet Take 1 tablet (40 mg total) by mouth daily. 30 tablet 11   nicotine polacrilex (NICORETTE) 4 MG gum Take 1 each (4 mg total) by mouth as needed for smoking cessation. 100 tablet 0   Omega-3 Fatty Acids (FISH OIL) 1000 MG CAPS Take 1,000 mg by mouth daily.     pantoprazole (PROTONIX) 40 MG tablet Take 1 tablet (40 mg total) by mouth daily. 90 tablet 3   QUEtiapine (SEROQUEL XR) 300 MG 24 hr tablet Take  300 mg by mouth at bedtime.     Semaglutide,0.25 or 0.5MG/DOS, (OZEMPIC, 0.25 OR 0.5 MG/DOSE,) 2 MG/3ML SOPN Inject 0.5 mg into the skin once a week. 3 mL 0   triamcinolone (KENALOG) 0.025 % ointment Apply 1 Application topically 2 (two) times daily as needed (eczema). 30 g 0   No current facility-administered medications for this visit.    REVIEW OF SYSTEMS:   _0  denotes positive finding, _1  denotes negative finding Cardiac  Comments:  Chest pain or chest pressure:    Shortness of breath upon exertion:    Short of breath when lying flat:    Irregular heart rhythm:        Vascular    Pain in calf, thigh, or hip brought on by ambulation:    Pain in feet at night that wakes you up from your sleep:     Blood clot in your veins:    Leg swelling:         Pulmonary    Oxygen at home:    Productive cough:     Wheezing:         Neurologic    Sudden weakness in arms or legs:     Sudden numbness in arms or legs:     Sudden onset of difficulty speaking or slurred speech:    Temporary loss of vision in one eye:     Problems with dizziness:         Gastrointestinal    Blood in stool:     Vomited blood:         Genitourinary    Burning when urinating:     Blood in urine:        Psychiatric    Major depression:         Hematologic    Bleeding problems:    Problems with blood clotting too easily:        Skin    Rashes or ulcers:        Constitutional    Fever or chills:      PHYSICAL EXAM:   There were no vitals filed for this visit.  GENERAL: The patient is a well-nourished female, in no acute distress. The vital signs are documented above. CARDIAC: There is a regular rate and rhythm.  PULMONARY: Non-labored respirations ABDOMEN: Soft and non-tender   MUSCULOSKELETAL: There are no major  deformities or cyanosis. NEUROLOGIC: No focal weakness or paresthesias are detected. SKIN: There are no ulcers or rashes noted. PSYCHIATRIC: The patient has a normal  affect.  STUDIES:   I have reviewed the following studies: Largest Aortic Diameter: 2.4 cm     Renal:     Right: Abnormal size for the right kidney. Normal right Resistive         Index. Normal cortical thickness of right kidney. Evidence of         a > 60% stenosis of the right renal artery. RRV flow present.         The right kidney is 2.3 cm smaller than the left.  Left:  Normal size of left kidney. Normal left Resistive Index.         Normal cortical thickness of the left kidney. No evidence of         left renal artery stenosis. LRV flow present. Widely patent         left renal artery without evidence of focal stenosis, s/p         stent placement.  Mesenteric:  Normal Celiac artery and Superior Mesenteric artery findings.     Patent IVC.   MEDICAL ISSUES:   Status post left renal artery stenting: The patient says her blood pressure is better.  She remains on all of her medicines.  She is scheduled to see her primary care physician in the near future for medication management.  From my perspective, she can return to normal activity.  She should remain on aspirin and Plavix.  I would like her on dual antiplatelet therapy for 6 months, and then I would be comfortable with her coming off of Plavix, however she is also on this for her prior strokes and so she may need to stay on it.  She will return in 1 year for follow-up renal ultrasound.    Leia Alf, MD, FACS [facility] [phone] Pager [phone]

## 2022-08-07 ENCOUNTER — Other Ambulatory Visit (HOSPITAL_COMMUNITY): Payer: Self-pay

## 2022-08-07 MED ORDER — OZEMPIC (0.25 OR 0.5 MG/DOSE) 2 MG/3ML ~~LOC~~ SOPN
0.5000 mg | PEN_INJECTOR | SUBCUTANEOUS | 0 refills | Status: DC
  Filled 2022-08-07: qty 3, 28d supply, fill #0

## 2022-08-10 ENCOUNTER — Other Ambulatory Visit: Payer: Self-pay

## 2022-08-10 ENCOUNTER — Other Ambulatory Visit (HOSPITAL_COMMUNITY): Payer: Self-pay

## 2022-08-10 DIAGNOSIS — I701 Atherosclerosis of renal artery: Secondary | ICD-10-CM

## 2022-08-15 ENCOUNTER — Other Ambulatory Visit (HOSPITAL_COMMUNITY): Payer: Self-pay

## 2022-08-21 ENCOUNTER — Other Ambulatory Visit: Payer: Self-pay | Admitting: Internal Medicine

## 2022-08-21 ENCOUNTER — Other Ambulatory Visit (HOSPITAL_COMMUNITY): Payer: Self-pay

## 2022-08-22 ENCOUNTER — Other Ambulatory Visit (HOSPITAL_COMMUNITY): Payer: Self-pay

## 2022-08-22 MED ORDER — LABETALOL HCL 200 MG PO TABS
200.0000 mg | ORAL_TABLET | Freq: Two times a day (BID) | ORAL | 11 refills | Status: DC
  Filled 2022-08-22: qty 60, 30d supply, fill #0
  Filled 2022-10-19: qty 60, 30d supply, fill #1
  Filled 2022-12-22: qty 60, 30d supply, fill #2
  Filled 2023-02-19: qty 60, 30d supply, fill #3
  Filled 2023-04-25: qty 60, 30d supply, fill #4
  Filled 2023-06-06: qty 60, 30d supply, fill #5
  Filled 2023-07-18: qty 60, 30d supply, fill #6

## 2022-09-03 ENCOUNTER — Other Ambulatory Visit (HOSPITAL_COMMUNITY): Payer: Self-pay

## 2022-09-10 ENCOUNTER — Other Ambulatory Visit (HOSPITAL_COMMUNITY): Payer: Self-pay

## 2022-09-10 ENCOUNTER — Encounter: Payer: Medicare Other | Admitting: Internal Medicine

## 2022-09-24 ENCOUNTER — Other Ambulatory Visit (HOSPITAL_COMMUNITY): Payer: Self-pay

## 2022-10-09 ENCOUNTER — Other Ambulatory Visit (HOSPITAL_COMMUNITY): Payer: Self-pay

## 2022-10-22 ENCOUNTER — Other Ambulatory Visit: Payer: Self-pay | Admitting: Internal Medicine

## 2022-10-22 ENCOUNTER — Other Ambulatory Visit (HOSPITAL_COMMUNITY): Payer: Self-pay

## 2022-10-22 DIAGNOSIS — Z1231 Encounter for screening mammogram for malignant neoplasm of breast: Secondary | ICD-10-CM

## 2022-11-12 ENCOUNTER — Encounter: Payer: Medicare Other | Admitting: Internal Medicine

## 2022-11-12 NOTE — Progress Notes (Deleted)
   Established Patient Office Visit  Subjective   Patient ID: Kendra Vega, female    DOB: [birth year]  Age: 51 y.o. MRN: [id number]  No chief complaint on file.   HPI  {History (Optional):23778}  ROS    Objective:     There were no vitals taken for this visit. {Vitals History (Optional):23777}  Physical Exam   No results found for any visits on 11/12/22.  {Labs (Optional):23779}  The ASCVD Risk score (Arnett DK, et al., 2019) failed to calculate for the following reasons:   The valid total cholesterol range is 130 to 320 mg/dL    Assessment & Plan:   Problem List Items Addressed This Visit   None   No follow-ups on file.    Dickie La, MD

## 2022-11-12 NOTE — Patient Instructions (Incomplete)
Please call [phone] to schedule a sleep study.

## 2022-12-09 ENCOUNTER — Other Ambulatory Visit: Payer: Self-pay | Admitting: Internal Medicine

## 2022-12-13 ENCOUNTER — Other Ambulatory Visit (HOSPITAL_COMMUNITY): Payer: Self-pay

## 2022-12-13 MED ORDER — FUROSEMIDE 80 MG PO TABS
80.0000 mg | ORAL_TABLET | Freq: Every day | ORAL | 0 refills | Status: DC
  Filled 2022-12-13: qty 90, 90d supply, fill #0

## 2022-12-18 ENCOUNTER — Ambulatory Visit
Admission: RE | Admit: 2022-12-18 | Discharge: 2022-12-18 | Disposition: A | Discharge status: Home / Self Care | Payer: 59 | Source: Ambulatory Visit | Attending: Internal Medicine | Admitting: Internal Medicine

## 2022-12-18 DIAGNOSIS — Z1231 Encounter for screening mammogram for malignant neoplasm of breast: Secondary | ICD-10-CM | POA: Diagnosis not present

## 2022-12-20 ENCOUNTER — Other Ambulatory Visit: Payer: Self-pay | Admitting: Internal Medicine

## 2022-12-20 DIAGNOSIS — R928 Other abnormal and inconclusive findings on diagnostic imaging of breast: Secondary | ICD-10-CM

## 2023-01-14 ENCOUNTER — Ambulatory Visit (INDEPENDENT_AMBULATORY_CARE_PROVIDER_SITE_OTHER): Payer: 59

## 2023-01-14 ENCOUNTER — Other Ambulatory Visit (HOSPITAL_COMMUNITY): Payer: Self-pay

## 2023-01-14 ENCOUNTER — Other Ambulatory Visit: Payer: Self-pay

## 2023-01-14 ENCOUNTER — Encounter: Payer: Self-pay | Admitting: Internal Medicine

## 2023-01-14 ENCOUNTER — Other Ambulatory Visit (HOSPITAL_COMMUNITY)
Admission: RE | Admit: 2023-01-14 | Discharge: 2023-01-14 | Disposition: A | Discharge status: Home / Self Care | Payer: 59 | Source: Ambulatory Visit | Attending: Internal Medicine | Admitting: Internal Medicine

## 2023-01-14 ENCOUNTER — Ambulatory Visit (INDEPENDENT_AMBULATORY_CARE_PROVIDER_SITE_OTHER): Payer: 59 | Admitting: Internal Medicine

## 2023-01-14 VITALS — BP 129/75 | HR 76 | Temp 98.3°F | Ht 65.0 in | Wt 219.9 lb

## 2023-01-14 VITALS — BP 129/75 | HR 78 | Temp 98.3°F | Ht 65.0 in | Wt 219.3 lb

## 2023-01-14 DIAGNOSIS — I701 Atherosclerosis of renal artery: Secondary | ICD-10-CM

## 2023-01-14 DIAGNOSIS — Z124 Encounter for screening for malignant neoplasm of cervix: Secondary | ICD-10-CM | POA: Insufficient documentation

## 2023-01-14 DIAGNOSIS — E1122 Type 2 diabetes mellitus with diabetic chronic kidney disease: Secondary | ICD-10-CM

## 2023-01-14 DIAGNOSIS — I1A0 Resistant hypertension: Secondary | ICD-10-CM | POA: Diagnosis not present

## 2023-01-14 DIAGNOSIS — N1832 Chronic kidney disease, stage 3b: Secondary | ICD-10-CM

## 2023-01-14 DIAGNOSIS — M25562 Pain in left knee: Secondary | ICD-10-CM

## 2023-01-14 DIAGNOSIS — F17210 Nicotine dependence, cigarettes, uncomplicated: Secondary | ICD-10-CM | POA: Diagnosis not present

## 2023-01-14 DIAGNOSIS — Z0000 Encounter for general adult medical examination without abnormal findings: Secondary | ICD-10-CM | POA: Diagnosis not present

## 2023-01-14 DIAGNOSIS — H3411 Central retinal artery occlusion, right eye: Secondary | ICD-10-CM

## 2023-01-14 DIAGNOSIS — F17200 Nicotine dependence, unspecified, uncomplicated: Secondary | ICD-10-CM

## 2023-01-14 DIAGNOSIS — K219 Gastro-esophageal reflux disease without esophagitis: Secondary | ICD-10-CM

## 2023-01-14 DIAGNOSIS — D72820 Lymphocytosis (symptomatic): Secondary | ICD-10-CM

## 2023-01-14 DIAGNOSIS — Z1211 Encounter for screening for malignant neoplasm of colon: Secondary | ICD-10-CM

## 2023-01-14 DIAGNOSIS — E119 Type 2 diabetes mellitus without complications: Secondary | ICD-10-CM | POA: Diagnosis not present

## 2023-01-14 DIAGNOSIS — N951 Menopausal and female climacteric states: Secondary | ICD-10-CM | POA: Insufficient documentation

## 2023-01-14 DIAGNOSIS — Z1151 Encounter for screening for human papillomavirus (HPV): Secondary | ICD-10-CM | POA: Diagnosis not present

## 2023-01-14 DIAGNOSIS — F418 Other specified anxiety disorders: Secondary | ICD-10-CM

## 2023-01-14 DIAGNOSIS — Z7984 Long term (current) use of oral hypoglycemic drugs: Secondary | ICD-10-CM

## 2023-01-14 DIAGNOSIS — Z6836 Body mass index (BMI) 36.0-36.9, adult: Secondary | ICD-10-CM

## 2023-01-14 DIAGNOSIS — G8929 Other chronic pain: Secondary | ICD-10-CM

## 2023-01-14 DIAGNOSIS — Z01419 Encounter for gynecological examination (general) (routine) without abnormal findings: Secondary | ICD-10-CM | POA: Diagnosis present

## 2023-01-14 DIAGNOSIS — E1151 Type 2 diabetes mellitus with diabetic peripheral angiopathy without gangrene: Secondary | ICD-10-CM | POA: Insufficient documentation

## 2023-01-14 LAB — POCT GLYCOSYLATED HEMOGLOBIN (HGB A1C): Hemoglobin A1C: 5.8 % — AB (ref 4.0–5.6)

## 2023-01-14 LAB — GLUCOSE, CAPILLARY: Glucose-Capillary: 135 mg/dL — ABNORMAL HIGH (ref 70–99)

## 2023-01-14 MED ORDER — GABAPENTIN 300 MG PO CAPS
300.0000 mg | ORAL_CAPSULE | Freq: Every day | ORAL | 11 refills | Status: DC
  Filled 2023-01-14: qty 30, 30d supply, fill #0

## 2023-01-14 MED ORDER — DICLOFENAC SODIUM 1 % EX GEL
4.0000 g | Freq: Four times a day (QID) | CUTANEOUS | 1 refills | Status: DC
  Filled 2023-01-14: qty 100, 7d supply, fill #0

## 2023-01-14 MED ORDER — LIRAGLUTIDE 18 MG/3ML ~~LOC~~ SOPN
PEN_INJECTOR | SUBCUTANEOUS | 0 refills | Status: DC
  Filled 2023-01-14: qty 3, 28d supply, fill #0
  Filled 2023-01-18 – 2023-01-29 (×2): qty 6, 30d supply, fill #0

## 2023-01-14 MED ORDER — ACCU-CHEK SOFTCLIX LANCETS MISC
Freq: Four times a day (QID) | 0 refills | Status: AC
  Filled 2023-01-14: qty 100, 25d supply, fill #0

## 2023-01-14 MED ORDER — BLOOD GLUCOSE METER KIT
PACK | Freq: Four times a day (QID) | 0 refills | Status: AC
  Filled 2023-01-14: qty 1, 90d supply, fill #0

## 2023-01-14 MED ORDER — PANTOPRAZOLE SODIUM 40 MG PO TBEC
40.0000 mg | DELAYED_RELEASE_TABLET | Freq: Two times a day (BID) | ORAL | 3 refills | Status: DC
  Filled 2023-01-14 – 2023-01-29 (×2): qty 180, 90d supply, fill #0
  Filled 2023-04-25: qty 180, 90d supply, fill #1
  Filled 2023-07-18: qty 180, 90d supply, fill #2
  Filled 2023-10-22: qty 180, 90d supply, fill #3

## 2023-01-14 MED ORDER — GLUCOSE BLOOD VI STRP
ORAL_STRIP | Freq: Four times a day (QID) | 0 refills | Status: AC
  Filled 2023-01-14: qty 100, 25d supply, fill #0

## 2023-01-14 NOTE — Assessment & Plan Note (Signed)
Chronic, aching pain of left knee that worsens with cold weather. No effusion, TTP, or other abnormality noted on today's examination. We discussed possible arthritis as cause of this pain. She has tried Tylenol Arthritis 1300 mg without significant relief. We discussed avoidance of NSAIDs due to advanced kidney disease. We discussed topical diclofenac + acetaminophen as needed on days with pain. No need for imaging at this time.

## 2023-01-14 NOTE — Assessment & Plan Note (Signed)
Blood pressure well managed today at 129/75. Will continue current regimen. We discussed the need to quit smoking to help with BP and overall risk reduction. Previously referred for sleep study but was unable to complete. She reports with recent weight loss she no longer snores. Will discuss continued need for referral at f/u and/or if BP worsens.  Plan -Continue lisinopril 40 mg, amlodipine 10 mg, and furosemide 80 mg daily. Labetalol 200 mg bid.  -Continued tobacco cessation counseling, she is cutting down on her own

## 2023-01-14 NOTE — Assessment & Plan Note (Addendum)
S/p left renal artery stenting with vascular surgery, Dr. Trula Slade, 07/03/22.  "Impression:             #1  60-70% left renal artery stenosis successfully treated using a 6 x 12 Herculink stent with no residual stenosis             #2  2 right renal arteries with less than 50% stenosis             #3  No significant infrarenal aortoiliac occlusive disease"  Per f/u notes, Dr. Trula Slade recommends DAPT for at least 6 months for renal stent, although she does remain on aspirin and Plavix for prior strokes. F/u in one year.

## 2023-01-14 NOTE — Progress Notes (Signed)
Established Patient Office Visit  Subjective   Patient ID: Kendra Vega, female    DOB: 01-29-1971  Age: 52 y.o. MRN: [id number]  Chief Complaint  Patient presents with   6 Month F/U Visit   Gynecologic Exam    [name] returns to clinic to follow-up on chronic conditions and discuss hot flashes, acid reflux, and chronic left knee pain. She also came to complete cervical cancer screening. She was last seen by me in clinic 06/2022. Before we started today's visit, [name] informed me she was thinking about searching out another clinic/doctor as she was frustrated with her care in [facility]. I listened to her frustrations and encouraged her to let us address some of her issues here today to which she was amenable. At the end of the visit, she did not feel she would be leaving the clinic or finding another doctor and noted that she was just in a bad emotional spot this morning. Please see assessment/plan in problem-based charting for further details of today's visit.    Patient Active Problem List   Diagnosis Date Noted   Renal artery stenosis ([place]) 01/14/2023   Elevated lymphocytes 01/14/2023   Chronic pain of left knee 01/14/2023   Perimenopausal vasomotor symptoms 01/14/2023   Cervical cancer screening 01/14/2023   Eczema 07/09/2022   Venous insufficiency of left leg 04/30/2022   Screening for malignant neoplasm of colon 04/30/2022   Chronic bilateral low back pain without sciatica 04/03/2022   Restless leg 03/15/2020   Type 2 diabetes mellitus without complication, without long-term current use of insulin ([place]) 10/21/2018   HLD (hyperlipidemia) 08/03/2017   History of CVA (cerebrovascular accident) 10/28/2016   Stage 3b chronic kidney disease ([place]) 10/28/2016   OBESITY 01/19/2011   GASTROESOPHAGEAL REFLUX DISEASE 08/10/2010   Depression with anxiety 06/27/2010   Smoker 06/27/2010   Resistant hypertension 06/07/2010   CRAO (central retinal artery occlusion), right  06/07/2010      Objective:     BP 129/75 (BP Location: Right Arm, Patient Position: Sitting, Cuff Size: Normal)   Pulse 76   Temp 98.3 F (36.8 C) (Oral)   Ht 5' 5"$  (1.651 m)   Wt 219 lb 14.4 oz (99.7 kg)   SpO2 99% Comment: RA  BMI 36.59 kg/m  BP Readings from Last 3 Encounters:  01/14/23 (!) 142/82  01/14/23 129/75  08/06/22 (!) 141/81   Wt Readings from Last 3 Encounters:  01/14/23 219 lb 4.4 oz (99.5 kg)  01/14/23 219 lb 14.4 oz (99.7 kg)  08/06/22 223 lb 8 oz (101.4 kg)      Physical Exam Vitals reviewed.  Constitutional:      General: She is not in acute distress.    Appearance: Normal appearance. She is not ill-appearing.  Abdominal:     General: Abdomen is flat. There is no distension.     Palpations: Abdomen is soft. There is no mass.     Tenderness: There is no abdominal tenderness.  Genitourinary:    Vagina: Vaginal discharge (thin, white) present.     Comments: 1 cm flesh-colored papule over right labia majora. No scarring or significant atrophy. No lesions of the vaginal canal or cervix.  Musculoskeletal:        General: No swelling, tenderness or deformity. Normal range of motion.  Neurological:     General: No focal deficit present.     Mental Status: She is alert.  Psychiatric:        Mood and Affect: Mood normal.  Behavior: Behavior normal.        Thought Content: Thought content normal.        Judgment: Judgment normal.    Results for orders placed or performed in visit on 01/14/23  Glucose, capillary  Result Value Ref Range   Glucose-Capillary 135 (H) 70 - 99 mg/dL  Results for orders placed or performed in visit on 01/14/23  POC Hbg A1C  Result Value Ref Range   Hemoglobin A1C 5.8 (A) 4.0 - 5.6 %   HbA1c POC (<> result, manual entry)     HbA1c, POC (prediabetic range)     HbA1c, POC (controlled diabetic range)       Assessment & Plan:   Problem List Items Addressed This Visit       Cardiovascular and Mediastinum    Resistant hypertension    Blood pressure well managed today at 129/75. Will continue current regimen. We discussed the need to quit smoking to help with BP and overall risk reduction. Previously referred for sleep study but was unable to complete. She reports with recent weight loss she no longer snores. Will discuss continued need for referral at f/u and/or if BP worsens.  Plan -Continue lisinopril 40 mg, amlodipine 10 mg, and furosemide 80 mg daily. Labetalol 200 mg bid.  -Continued tobacco cessation counseling, she is cutting down on her own      Relevant Orders   BMP8+Anion Gap   CRAO (central retinal artery occlusion), right   Renal artery stenosis ([facility])    S/p left renal artery stenting with vascular surgery, Dr. Trula Slade, 07/03/22.  "Impression:             #1  60-70% left renal artery stenosis successfully treated using a 6 x 12 Herculink stent with no residual stenosis             #2  2 right renal arteries with less than 50% stenosis             #3  No significant infrarenal aortoiliac occlusive disease"  Per f/u notes, Dr. Trula Slade recommends DAPT for at least 6 months for renal stent, although she does remain on aspirin and Plavix for prior strokes. F/u in one year.      Perimenopausal vasomotor symptoms    [name] reports no menstrual cycle in 6 months and wonders if she is going through menopause. We discussed menopause is diagnosed with the absence of menstruation for one year. She has experienced hot flashes during sleep and during the day. She also feels she has had more mood swings. We discussed other potential changes that may accompany perimenopause/menopause. She is interested in trying medical therapy for her vasomotor symptoms. She is not a good candidate for HRT due to history of stroke. We discussed options including alternative SSRI or SNRI, but she does not want to change from Prozac at this time. I recommend a trial of gabapentin to which she was amenable. Discussed  possible SE and potential need for future titration. Pelvic exam without evidence of significant atrophy.  Plan -Start gabapentin 300 mg nightly       Relevant Medications   gabapentin (NEURONTIN) 300 MG capsule     Digestive   GASTROESOPHAGEAL REFLUX DISEASE    Ms. Hickox notes pantoprazole 40 mg daily does not entirely control heartburn symptoms, with breakthrough noted about every other day. She did try taking it twice daily once and felt this helped. She denies dysphagia, odynophagia, early satiety, nausea, vomiting. Will trial increase to  40 mg twice daily. If no significant improvement, we discussed potential referral to GI for EGD.      Relevant Medications   sennosides-docusate sodium (SENOKOT-S) 8.6-50 MG tablet   simethicone (MYLICON) 0000000 MG chewable tablet   pantoprazole (PROTONIX) 40 MG tablet     Endocrine   Type 2 diabetes mellitus without complication, without long-term current use of insulin (HCC) - Primary    Stopped semaglutide due to rash. A1c today mildly increased to 5.8%, still within goal. Down about 27 pounds since May 2023. UTD on foot exam, eye exam scheduled next month. Urine ACR ordered today. She remains on high intensity statin. Her glucometer has broken and I have sent a new prescription. She is interested in further weight loss and we discussed alternative GLP1-RA for diabetes management and weight loss. She continues to work on dietary changes.  Plan -Liraglutide 0.6 mg daily for one week, then increase to 1.2 mg daily -ACR -F/u eye exam      Relevant Medications   blood glucose meter kit and supplies   liraglutide (VICTOZA) 18 MG/3ML SOPN   Other Relevant Orders   POC Hbg A1C (Completed)   Microalbumin / Creatinine Urine Ratio     Genitourinary   Stage 3b chronic kidney disease ([place])    Follows with [facility]. Complicated by secondary hyperaparthyroidism. F/u scheduled next month. Will fu records.        Other   Depression with  anxiety    PH-9 8 today. Ms. Greynolds was tearful when discussing the loss of her son and continues to struggle with his death. She follows with [facility]. I have requested records to confirm medications. She did not bring her psychiatric medications to today's appointment for review. She denies any SI.       Smoker    Continues to smoke 2 Black & Milds/day which is decreased from previous. She was unable to afford nicotine gum OTC. She feels she will be able to quit on her own and would like to do so. I encouraged her as this will be a great thing for her health overall.      OBESITY    Weight improved almost 30 pounds since May 2023. BMI 36. She is continuing to work on healthy lifestyle changes and had been taking semaglutide for diabetes and weight loss until she had to stop due to rash. She is interested in further weight loss. We have discussed liraglutide for DM and weight management. Could also consider referral to [facility] clinic.      Relevant Medications   liraglutide (VICTOZA) 18 MG/3ML SOPN   Screening for malignant neoplasm of colon    Provided with an additional FIT kit for colon cancer screening today. Encouraged her to return this to complete screening.      Relevant Orders   Fecal occult blood, imunochemical   Elevated lymphocytes    Persistent mild elevation in ALC over the last two years. Lymphocytosis (>4000) noted four years ago. Will repeat CBC today and smear.      Relevant Orders   CBC with Diff   Pathologist smear review   Chronic pain of left knee    Chronic, aching pain of left knee that worsens with cold weather. No effusion, TTP, or other abnormality noted on today's examination. We discussed possible arthritis as cause of this pain. She has tried Tylenol Arthritis 1300 mg without significant relief. We discussed avoidance of NSAIDs due to advanced kidney disease. We discussed topical diclofenac +  acetaminophen as needed on days with pain. No need for imaging at  this time.      Relevant Medications   gabapentin (NEURONTIN) 300 MG capsule   diclofenac Sodium (VOLTAREN) 1 % GEL   Cervical cancer screening    Completed cervical cancer screening today with Pap smear co-testing. Healthy appearing vaginal and cervical tissue. Thin, white vaginal discharge noted on examination. Small, 1-cm, flesh-colored papule over right labia majora--appears consistent with ingrown hair/follicle due to shaving.       Relevant Orders   Cytology -Pap Smear    Return in about 3 months (around 04/14/2023) for fu DM.    Charise Killian, MD

## 2023-01-14 NOTE — Assessment & Plan Note (Signed)
Weight improved almost 30 pounds since May 2023. BMI 36. She is continuing to work on healthy lifestyle changes and had been taking semaglutide for diabetes and weight loss until she had to stop due to rash. She is interested in further weight loss. We have discussed liraglutide for DM and weight management. Could also consider referral to [facility] clinic.

## 2023-01-14 NOTE — Patient Instructions (Addendum)
It was wonderful to see you today!  For acid reflux, increase your pantoprazole (Protonix) to twice daily. If you continue to have breakthrough symptoms of heartburn, please call me to let me know.  For hot flashes, start taking gabapentin 300 mg at night before sleep. This medication may make you more drowsy so make sure to not drive or do other important tasks after taking it until you know how it will affect you.  For your knee pain, you may continue to take Tylenol Arthritis. Please try the diclofenac (Voltaren) gel up to four times daily when you have pain. Do NOT take ibuprofen because we want to protect your kidneys!  For diabetes, we are starting a daily injection called liraglutide (Victoza). You will inject 0.6 mg daily for one week, then increase to 1.2 mg daily.  Keep working on quitting smoking. You can do it!  Please return your colon cancer screening stool kit to the clinic!  I will call you with any abnormal labs.

## 2023-01-14 NOTE — Assessment & Plan Note (Signed)
Kendra Vega notes pantoprazole 40 mg daily does not entirely control heartburn symptoms, with breakthrough noted about every other day. She did try taking it twice daily once and felt this helped. She denies dysphagia, odynophagia, early satiety, nausea, vomiting. Will trial increase to 40 mg twice daily. If no significant improvement, we discussed potential referral to GI for EGD.

## 2023-01-14 NOTE — Assessment & Plan Note (Signed)
Follows with [facility]. Complicated by secondary hyperaparthyroidism. F/u scheduled next month. Will fu records.

## 2023-01-14 NOTE — Assessment & Plan Note (Signed)
Provided with an additional FIT kit for colon cancer screening today. Encouraged her to return this to complete screening.

## 2023-01-14 NOTE — Assessment & Plan Note (Addendum)
Completed cervical cancer screening today with Pap smear co-testing. Healthy appearing vaginal and cervical tissue. Thin, white vaginal discharge noted on examination. Small, 1-cm, flesh-colored papule over right labia majora--appears consistent with ingrown hair/follicle due to shaving.

## 2023-01-14 NOTE — Assessment & Plan Note (Signed)
Continues to smoke 2 Black & Milds/day which is decreased from previous. She was unable to afford nicotine gum OTC. She feels she will be able to quit on her own and would like to do so. I encouraged her as this will be a great thing for her health overall.

## 2023-01-14 NOTE — Assessment & Plan Note (Addendum)
Stopped semaglutide due to rash. A1c today mildly increased to 5.8%, still within goal. Down about 27 pounds since May 2023. UTD on foot exam, eye exam scheduled next month. Urine ACR ordered today. She remains on high intensity statin. Her glucometer has broken and I have sent a new prescription. She is interested in further weight loss and we discussed alternative GLP1-RA for diabetes management and weight loss. She continues to work on dietary changes.  Plan -Liraglutide 0.6 mg daily for one week, then increase to 1.2 mg daily -ACR -F/u eye exam

## 2023-01-14 NOTE — Assessment & Plan Note (Signed)
PH-9 8 today. Ms. Bogusz was tearful when discussing the loss of her son and continues to struggle with his death. She follows with [facility]. I have requested records to confirm medications. She did not bring her psychiatric medications to today's appointment for review. She denies any SI.

## 2023-01-14 NOTE — Assessment & Plan Note (Signed)
Persistent mild elevation in ALC over the last two years. Lymphocytosis (>4000) noted four years ago. Will repeat CBC today and smear.

## 2023-01-14 NOTE — Assessment & Plan Note (Signed)
Kendra Vega reports no menstrual cycle in 6 months and wonders if she is going through menopause. We discussed menopause is diagnosed with the absence of menstruation for one year. She has experienced hot flashes during sleep and during the day. She also feels she has had more mood swings. We discussed other potential changes that may accompany perimenopause/menopause. She is interested in trying medical therapy for her vasomotor symptoms. She is not a good candidate for HRT due to history of stroke. We discussed options including alternative SSRI or SNRI, but she does not want to change from Prozac at this time. I recommend a trial of gabapentin to which she was amenable. Discussed possible SE and potential need for future titration. Pelvic exam without evidence of significant atrophy.  Plan -Start gabapentin 300 mg nightly

## 2023-01-14 NOTE — Progress Notes (Signed)
Subjective:   Kendra Vega is a 52 y.o. female who presents for Medicare Annual (Subsequent) preventive examination. I connected with  Paralee Cancel on 01/14/23 by a  Face-To-Face  enabled telemedicine application and verified that I am speaking with the correct person using two identifiers.  Patient Location: Other:  Office/Clinic  Provider Location: Office/Clinic  I discussed the limitations of evaluation and management by telemedicine. The patient expressed understanding and agreed to proceed.  Review of Systems    Defer to PCP       Objective:    Today's Vitals   01/14/23 1251 01/14/23 1252  BP: (!) 142/82 129/75  Pulse: 78   Temp: 98.3 F (36.8 C)   TempSrc: Oral   SpO2: 99%   Weight: 219 lb 4.4 oz (99.5 kg)   Height: 5' 5"$  (1.651 m)    Body mass index is 36.49 kg/m.     01/14/2023   12:53 PM 01/14/2023    9:20 AM 07/09/2022   10:08 AM 07/03/2022    6:08 AM 05/07/2022   11:28 AM 04/30/2022   10:09 AM 04/02/2022   10:48 AM  Advanced Directives  Does Patient Have a Medical Advance Directive? No No No No No No No  Would patient like information on creating a medical advance directive? No - Patient declined No - Patient declined No - Patient declined No - Patient declined No - Patient declined No - Patient declined No - Patient declined    Current Medications (verified) Outpatient Encounter Medications as of 01/14/2023  Medication Sig   acetaminophen (TYLENOL) 500 MG tablet Take 1,000 mg by mouth every 8 (eight) hours as needed for moderate pain.   amLODipine (NORVASC) 10 MG tablet Take 1 tablet (10 mg total) by mouth daily.   aspirin EC 81 MG tablet Take 81 mg by mouth daily. Swallow whole.   atorvastatin (LIPITOR) 40 MG tablet Take 1 tablet (40 mg total) by mouth daily.   Biotin 10000 MCG TABS Take 1 tablet by mouth daily.   busPIRone (BUSPAR) 10 MG tablet Take 10 mg by mouth 3 (three) times daily as needed.   clopidogrel (PLAVIX) 75 MG tablet Take 1 tablet  (75 mg total) by mouth daily.   FLUoxetine (PROZAC) 40 MG capsule Take 1 capsule (40 mg total) by mouth daily.   furosemide (LASIX) 80 MG tablet Take 1 tablet (80 mg total) by mouth daily.   gabapentin (NEURONTIN) 300 MG capsule Take 1 capsule (300 mg total) by mouth at bedtime.   hydrOXYzine (ATARAX/VISTARIL) 25 MG tablet Take 1 tablet (25 mg total) by mouth 3 (three) times daily.   labetalol (NORMODYNE) 200 MG tablet Take 1 tablet (200 mg total) by mouth 2 (two) times daily.   Lancets (ONETOUCH DELICA PLUS 123XX123) MISC Use up to 4 (four) times daily.   liraglutide (VICTOZA) 18 MG/3ML SOPN Inject 0.6 mg one daily for one week, then increase to 1.2 mg daily.   lisinopril (ZESTRIL) 40 MG tablet Take 1 tablet (40 mg total) by mouth daily.   Omega-3 Fatty Acids (FISH OIL) 1000 MG CAPS Take 1,000 mg by mouth daily.   pantoprazole (PROTONIX) 40 MG tablet Take 1 tablet (40 mg total) by mouth 2 (two) times daily.   QUEtiapine (SEROQUEL XR) 300 MG 24 hr tablet Take 300 mg by mouth at bedtime.   simethicone (MYLICON) 0000000 MG chewable tablet Chew 125 mg by mouth every 6 (six) hours as needed for flatulence.   triamcinolone (KENALOG) 0.025 %  ointment Apply 1 Application topically 2 (two) times daily as needed (eczema).   [DISCONTINUED] glucose blood test strip Use up to 4 (four) times daily to check blood sugar.   No facility-administered encounter medications on file as of 01/14/2023.    Allergies (verified) Patient has no known allergies.   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   Breast mass    rt breast ?abcess x's 1 week   Chronic kidney disease    Dental caries    Depression    GERD (gastroesophageal reflux disease)    Healthcare maintenance 09/20/2020   Hypertension    Otitis externa of right ear 01/24/2022   Stroke ([place])    Type 2 diabetes mellitus without complication, without long-term current use of  insulin ([place]) 10/21/2018   WARTS, HAND 01/19/2011   Qualifier: Diagnosis of  By: Hassell Done FNP, Tori Milks     Past Surgical History:  Procedure Laterality Date   BREAST CYST ASPIRATION Right 08/06/2017   breast abcess aspirated   MULTIPLE TOOTH EXTRACTIONS     PERIPHERAL VASCULAR INTERVENTION Left 07/03/2022   Procedure: PERIPHERAL VASCULAR INTERVENTION;  Surgeon: Serafina Mitchell, MD;  Location: [facility];  Service: Cardiovascular;  Laterality: Left;  left renal   RENAL ANGIOGRAPHY Right 07/03/2022   Procedure: RENAL ANGIOGRAPHY;  Surgeon: Serafina Mitchell, MD;  Location: [facility];  Service: Cardiovascular;  Laterality: Right;   TOOTH EXTRACTION N/A 12/11/2019   Procedure: EXTRACTION MOLARS;  Surgeon: Diona Browner, DDS;  Location: [facility];  Service: Oral Surgery;  Laterality: N/A;   Family History  Problem Relation Age of Onset   Kidney disease Mother    Diabetes Mother    Hypertension Mother    Hypertension Sister    Hypertension Sister    Hypertension Sister    Heart attack Son    Social History   Socioeconomic History   Marital status: Significant Other    Spouse name: Not on file   Number of children: 1   Years of education: Not on file   Highest education level: 10th grade  Occupational History   Not on file  Tobacco Use   Smoking status: Every Day    Packs/day: 0.20    Years: 7.00    Total pack years: 1.40    Types: Cigarettes   Smokeless tobacco: Never   Tobacco comments:    smokes 2 daily " Black and Milds "   Vaping Use   Vaping Use: Never used  Substance and Sexual Activity   Alcohol use: Yes    Comment: rare glass of wine-holidays.   Drug use: Yes    Frequency: 1.0 times per week    Types: Marijuana    Comment: occasional marijuana    Sexual activity: Not Currently    Partners: Male    Birth control/protection: None  Other Topics Concern   Not on file  Social History Narrative   Current Social History 06/21/2021        Patient lives  with a significant other in an apartment which is 1 story/stories. There are not steps up to the entrance the patient uses.       Patient's method of transportation is via family member or public transportation if needed.      The highest level of education was some high school.      The patient currently disabled but  would like part time job.       Identified important Relationships are "my dogs"      Pets : 2       Interests / Fun: "I love to shop"       Current Stressors: son's passing and her bills      Religious / Personal Beliefs: "I just believe in Jesus Christ"        Social Determinants of Health   Financial Resource Strain: High Risk (01/14/2023)   Overall Financial Resource Strain (CARDIA)    Difficulty of Paying Living Expenses: Very hard  Food Insecurity: Food Insecurity Present (01/14/2023)   Hunger Vital Sign    Worried About Running Out of Food in the Last Year: Sometimes true    Ran Out of Food in the Last Year: Often true  Transportation Needs: Unmet Transportation Needs (01/14/2023)   PRAPARE - Hydrologist (Medical): Yes    Lack of Transportation (Non-Medical): Yes  Physical Activity: Inactive (01/14/2023)   Exercise Vital Sign    Days of Exercise per Week: 0 days    Minutes of Exercise per Session: 0 min  Stress: Stress Concern Present (01/14/2023)   [place]    Feeling of Stress : Very much  Social Connections: Unknown (01/14/2023)   Social Connection and Isolation Panel [NHANES]    Frequency of Communication with Friends and Family: Three times a week    Frequency of Social Gatherings with Friends and Family: Patient refused    Attends Religious Services: Not on Advertising copywriter or Organizations: No    Attends Archivist Meetings: Never    Marital Status: Divorced    Tobacco Counseling Ready to quit: Not Answered Counseling given:  Not Answered Tobacco comments: smokes 2 daily " Black and Milds "    Clinical Intake:  Pre-visit preparation completed: Yes  Pain : No/denies pain     Nutritional Risks: None Diabetes: Yes CBG done?: Yes CBG resulted in Enter/ Edit results?: Yes Did pt. bring in CBG monitor from home?: No Glucose Meter Downloaded?: No  How often do you need to have someone help you when you read instructions, pamphlets, or other written materials from your doctor or pharmacy?: 1 - Never What is the last grade level you completed in school?: 10th grade  Diabetic?Nutrition Risk Assessment:  Has the patient had any N/V/D within the last 2 months?  No  Does the patient have any non-healing wounds?  No  Has the patient had any unintentional weight loss or weight gain?  No   Diabetes:  Is the patient diabetic?  Yes  If diabetic, was a CBG obtained today?  Yes  Did the patient bring in their glucometer from home?  No  How often do you monitor your CBG's? Every 6 months.   Financial Strains and Diabetes Management:  Are you having any financial strains with the device, your supplies or your medication? No .  Does the patient want to be seen by Chronic Care Management for management of their diabetes?  No  Would the patient like to be referred to a Nutritionist or for Diabetic Management?  No   Diabetic Exams:  Diabetic Eye Exam: Overdue for diabetic eye exam. Pt has been advised about the importance in completing this exam. Patient advised to call and schedule an eye exam. Diabetic Foot Exam: Completed 04/02/2022    Interpreter Needed?:  No  Information entered by :: Darrell Leonhardt,cma 01/14/23 12:53pm   Activities of Daily Living    01/14/2023   12:53 PM 01/14/2023    9:26 AM  In your present state of health, do you have any difficulty performing the following activities:  Hearing? 0 0  Vision? 0 0  Difficulty concentrating or making decisions? 0 0  Walking or climbing stairs? 1 1   Dressing or bathing? 0 0  Doing errands, shopping? 0 0    Patient Care Team: Charise Killian, MD as PCP - General (Internal Medicine)  Indicate any recent Medical Services you may have received from other than [facility] providers in the past year (date may be approximate).     Assessment:   This is a routine wellness examination for Chanon.  Hearing/Vision screen No results found.  Dietary issues and exercise activities discussed:     Goals Addressed   None   Depression Screen    01/14/2023   12:53 PM 01/14/2023   10:42 AM 07/09/2022   10:49 AM 04/30/2022   10:44 AM 04/02/2022   11:16 AM 01/24/2022   10:21 AM 09/07/2021   12:05 PM  PHQ 2/9 Scores  PHQ - 2 Score 4 4 2 4 3 6 5  $ PHQ- 9 Score 8 8 7 15 11 11 17    $ Fall Risk    01/14/2023   12:53 PM 01/14/2023    9:26 AM 07/09/2022   10:07 AM 04/30/2022   10:08 AM 04/02/2022   10:47 AM  Fall Risk   Falls in the past year? 0 0 0 0 0  Number falls in past yr: 0 0 0 0 0  Injury with Fall? 0 0 0 0 0  Risk for fall due to : No Fall Risks No Fall Risks No Fall Risks No Fall Risks No Fall Risks  Follow up Falls evaluation completed;Falls prevention discussed Falls evaluation completed;Falls prevention discussed Falls evaluation completed;Falls prevention discussed Falls evaluation completed;Falls prevention discussed Falls evaluation completed    FALL RISK PREVENTION PERTAINING TO THE HOME:  Any stairs in or around the home?  Patient Refusal If so, are there any without handrails? Patient Refusal Home free of loose throw rugs in walkways, pet beds, electrical cords, etc? Patient Refusal Adequate lighting in your home to reduce risk of falls? Patient Refusal  ASSISTIVE DEVICES UTILIZED TO PREVENT FALLS:  Life alert? Patient Refusal Use of a cane, walker or w/c? Patient Refusal Grab bars in the bathroom? Patient Refusal Shower chair or bench in shower? Patient Refusal Elevated toilet seat or a handicapped toilet? Patient Refusal  TIMED UP  AND GO:  Was the test performed? Yes .  Length of time to ambulate 10 feet: 1 min.  Gait slow and steady without use of assistive device  Cognitive Function:        01/14/2023   12:54 PM 06/21/2021   11:23 AM  6CIT Screen  What Year? 0 points 0 points  What month? 0 points 0 points  What time? 0 points 0 points  Count back from 20 0 points 0 points  Months in reverse 0 points 0 points  Repeat phrase 0 points 0 points  Total Score 0 points 0 points    Immunizations Immunization History  Administered Date(s) Administered   Influenza,inj,Quad PF,6+ Mos 11/14/2016, 08/23/2017   Td 11/26/2005   Tdap 08/03/2017    TDAP status: Up to date  Flu Vaccine status: Due, Education has been provided regarding the importance of this  vaccine. Advised may receive this vaccine at local pharmacy or Health Dept. Aware to provide a copy of the vaccination record if obtained from local pharmacy or Health Dept. Verbalized acceptance and understanding.  Pneumococcal vaccine status: Due, Education has been provided regarding the importance of this vaccine. Advised may receive this vaccine at local pharmacy or Health Dept. Aware to provide a copy of the vaccination record if obtained from local pharmacy or Health Dept. Verbalized acceptance and understanding.  Covid-19 vaccine status: Information provided on how to obtain vaccines.   Qualifies for Shingles Vaccine? No   Zostavax completed No   Shingrix Completed?: No.    Education has been provided regarding the importance of this vaccine. Patient has been advised to call insurance company to determine out of pocket expense if they have not yet received this vaccine. Advised may also receive vaccine at local pharmacy or Health Dept. Verbalized acceptance and understanding.  Screening Tests Health Maintenance  Topic Date Due   COVID-19 Vaccine (1) Never done   COLON CANCER SCREENING ANNUAL FOBT  Never done   Zoster Vaccines- Shingrix (1 of 2)  Never done   INFLUENZA VACCINE  06/26/2022   PAP SMEAR-Modifier  08/06/2022   OPHTHALMOLOGY EXAM  08/10/2022   Diabetic kidney evaluation - eGFR measurement  09/07/2022   COLONOSCOPY (Pts 45-65yr Insurance coverage will need to be confirmed)  05/01/2023 (Originally 06/18/2015)   Diabetic kidney evaluation - Urine ACR  03/31/2023   FOOT EXAM  04/03/2023   HEMOGLOBIN A1C  07/15/2023   Medicare Annual Wellness (AWV)  01/15/2024   MAMMOGRAM  12/18/2024   DTaP/Tdap/Td (3 - Td or Tdap) 08/04/2027   Hepatitis C Screening  Completed   HIV Screening  Completed   HPV VACCINES  Aged Out    Health Maintenance  Health Maintenance Due  Topic Date Due   COVID-19 Vaccine (1) Never done   COLON CANCER SCREENING ANNUAL FOBT  Never done   Zoster Vaccines- Shingrix (1 of 2) Never done   INFLUENZA VACCINE  06/26/2022   PAP SMEAR-Modifier  08/06/2022   OPHTHALMOLOGY EXAM  08/10/2022   Diabetic kidney evaluation - eGFR measurement  09/07/2022    Colorectal cancer screening: Referral to GI placed 03/31/2023. Pt aware the office will call re: appt.  Mammogram status: Completed 12/18/2022. Repeat every year   Lung Cancer Screening: (Low Dose CT Chest recommended if Age 53-80years, 30 pack-year currently smoking OR have quit w/in 15years.) does not qualify.   Lung Cancer Screening Referral: N/A  Additional Screening:  Hepatitis C Screening: does not qualify; Completed 01/19/2011  Vision Screening: Recommended annual ophthalmology exams for early detection of glaucoma and other disorders of the eye. Is the patient up to date with their annual eye exam?  Patient Refusal Who is the provider or what is the name of the office in which the patient attends annual eye exams? Patient Refusal If pt is not established with a provider, would they like to be referred to a provider to establish care? Patient Refusal.   Dental Screening: Recommended annual dental exams for proper oral hygiene  Community  Resource Referral / Chronic Care Management: CRR required this visit?  No   CCM required this visit?  No      Plan:     I have personally reviewed and noted the following in the patient's chart:   Medical and social history Use of alcohol, tobacco or illicit drugs  Current medications and supplements including opioid prescriptions. Patient is currently  taking opioid prescriptions. Information provided to patient regarding non-opioid alternatives. Patient advised to discuss non-opioid treatment plan with their provider. Functional ability and status Nutritional status Physical activity Advanced directives List of other physicians Hospitalizations, surgeries, and ER visits in previous 12 months Vitals Screenings to include cognitive, depression, and falls Referrals and appointments  In addition, I have reviewed and discussed with patient certain preventive protocols, quality metrics, and best practice recommendations. A written personalized care plan for preventive services as well as general preventive health recommendations were provided to patient.     Kerin Perna, [facility]   01/14/2023   Nurse Notes: Face-To-Face Visit  Ms. Double , Thank you for taking time to come for your Medicare Wellness Visit. I appreciate your ongoing commitment to your health goals. Please review the following plan we discussed and let me know if I can assist you in the future.   These are the goals we discussed:  Goals      Exercise 3x per week (30 min per time)     Set My Weight Loss Goal     - set weight loss goal of 80 lbs    Why is this important?   Losing only 5 to 15 percent of your weight makes a big difference in your health.    Notes:         This is a list of the screening recommended for you and due dates:  Health Maintenance  Topic Date Due   COVID-19 Vaccine (1) Never done   Stool Blood Test  Never done   Zoster (Shingles) Vaccine (1 of 2) Never done   Flu Shot  06/26/2022    Pap Smear  08/06/2022   Eye exam for diabetics  08/10/2022   Yearly kidney function blood test for diabetes  09/07/2022   Colon Cancer Screening  05/01/2023*   Yearly kidney health urinalysis for diabetes  03/31/2023   Complete foot exam   04/03/2023   Hemoglobin A1C  07/15/2023   Medicare Annual Wellness Visit  01/15/2024   Mammogram  12/18/2024   DTaP/Tdap/Td vaccine (3 - Td or Tdap) 08/04/2027   Hepatitis C Screening: USPSTF Recommendation to screen - Ages 77-79 yo.  Completed   HIV Screening  Completed   HPV Vaccine  Aged Out  *Topic was postponed. The date shown is not the original due date.

## 2023-01-15 ENCOUNTER — Other Ambulatory Visit (HOSPITAL_COMMUNITY): Payer: Self-pay

## 2023-01-15 LAB — CBC WITH DIFFERENTIAL/PLATELET
Basophils Absolute: 0.1 10*3/uL (ref 0.0–0.2)
Basos: 1 %
EOS (ABSOLUTE): 0.2 10*3/uL (ref 0.0–0.4)
Eos: 2 %
Hematocrit: 37.3 % (ref 34.0–46.6)
Hemoglobin: 12.3 g/dL (ref 11.1–15.9)
Immature Grans (Abs): 0 10*3/uL (ref 0.0–0.1)
Immature Granulocytes: 0 %
Lymphocytes Absolute: 3.6 10*3/uL — ABNORMAL HIGH (ref 0.7–3.1)
Lymphs: 37 %
MCH: 26.2 pg — ABNORMAL LOW (ref 26.6–33.0)
MCHC: 33 g/dL (ref 31.5–35.7)
MCV: 80 fL (ref 79–97)
Monocytes Absolute: 0.5 10*3/uL (ref 0.1–0.9)
Monocytes: 5 %
Neutrophils Absolute: 5.2 10*3/uL (ref 1.4–7.0)
Neutrophils: 55 %
Platelets: 338 10*3/uL (ref 150–450)
RBC: 4.69 x10E6/uL (ref 3.77–5.28)
RDW: 15.8 % — ABNORMAL HIGH (ref 11.7–15.4)
WBC: 9.6 10*3/uL (ref 3.4–10.8)

## 2023-01-15 LAB — BMP8+ANION GAP
Anion Gap: 16 mmol/L (ref 10.0–18.0)
BUN/Creatinine Ratio: 10 (ref 9–23)
BUN: 10 mg/dL (ref 6–24)
CO2: 22 mmol/L (ref 20–29)
Calcium: 9.4 mg/dL (ref 8.7–10.2)
Chloride: 103 mmol/L (ref 96–106)
Creatinine, Ser: 0.98 mg/dL (ref 0.57–1.00)
Glucose: 98 mg/dL (ref 70–99)
Potassium: 3.5 mmol/L (ref 3.5–5.2)
Sodium: 141 mmol/L (ref 134–144)
eGFR: 69 mL/min/{1.73_m2} (ref >59)

## 2023-01-15 LAB — MICROALBUMIN / CREATININE URINE RATIO
Creatinine, Urine: 36 mg/dL
Microalb/Creat Ratio: 222 mg/g creat — ABNORMAL HIGH (ref 0–29)
Microalbumin, Urine: 79.8 ug/mL

## 2023-01-16 LAB — PATHOLOGIST SMEAR REVIEW
Basophils Absolute: 0.1 x10E3/uL (ref 0.0–0.2)
Basos: 1 %
EOS (ABSOLUTE): 0.2 x10E3/uL (ref 0.0–0.4)
Eos: 2 %
Hematocrit: 37.6 % (ref 34.0–46.6)
Hemoglobin: 12.3 g/dL (ref 11.1–15.9)
Immature Grans (Abs): 0 x10E3/uL (ref 0.0–0.1)
Immature Granulocytes: 0 %
Lymphocytes Absolute: 3.8 x10E3/uL — ABNORMAL HIGH (ref 0.7–3.1)
Lymphs: 37 %
MCH: 26.5 pg — ABNORMAL LOW (ref 26.6–33.0)
MCHC: 32.7 g/dL (ref 31.5–35.7)
MCV: 81 fL (ref 79–97)
Monocytes Absolute: 0.5 x10E3/uL (ref 0.1–0.9)
Monocytes: 5 %
Neutrophils Absolute: 5.8 x10E3/uL (ref 1.4–7.0)
Neutrophils: 55 %
Platelets: 337 x10E3/uL (ref 150–450)
RBC: 4.64 x10E6/uL (ref 3.77–5.28)
RDW: 15.9 % — ABNORMAL HIGH (ref 11.7–15.4)
WBC: 10.3 x10E3/uL (ref 3.4–10.8)

## 2023-01-16 NOTE — Progress Notes (Signed)
ROI form was faxed to [facility] 01/24/23 ( fax# [phone]).

## 2023-01-17 NOTE — Progress Notes (Signed)
BMP wnl. Kidney function interestingly looks improved. However, evidence of moderately increased albuminuria. With history of CKD and proteinuria, will discuss SLGT2i. Unable to reach Kendra Vega by telephone 2/22, will attempt at a later time.

## 2023-01-17 NOTE — Progress Notes (Signed)
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 Unclear etiology although suspect reactive secondary to ongoing smoking. Will continue to monitor and discuss referral to hematology if persistent after quitting and/or increasing.   Telephone call straight to voicemail 2/22, will try to reach Kendra Vega to discuss at a later time.

## 2023-01-18 ENCOUNTER — Telehealth: Payer: Self-pay | Admitting: Internal Medicine

## 2023-01-18 ENCOUNTER — Other Ambulatory Visit (HOSPITAL_COMMUNITY): Payer: Self-pay

## 2023-01-18 DIAGNOSIS — E119 Type 2 diabetes mellitus without complications: Secondary | ICD-10-CM

## 2023-01-18 DIAGNOSIS — R809 Proteinuria, unspecified: Secondary | ICD-10-CM

## 2023-01-18 DIAGNOSIS — N1832 Chronic kidney disease, stage 3b: Secondary | ICD-10-CM

## 2023-01-18 MED ORDER — EMPAGLIFLOZIN 10 MG PO TABS
10.0000 mg | ORAL_TABLET | Freq: Every day | ORAL | 11 refills | Status: DC
  Filled 2023-01-18: qty 30, 30d supply, fill #0
  Filled 2023-02-16: qty 30, 30d supply, fill #1
  Filled 2023-03-21: qty 30, 30d supply, fill #2
  Filled 2023-04-25: qty 30, 30d supply, fill #3
  Filled 2023-05-31: qty 30, 30d supply, fill #4
  Filled 2023-05-31: qty 100, 100d supply, fill #4
  Filled 2023-09-13 – 2023-09-26 (×2): qty 100, 100d supply, fill #5
  Filled 2023-12-29: qty 40, 40d supply, fill #6

## 2023-01-18 NOTE — Telephone Encounter (Signed)
Spoke with Kendra Vega 01/18/23. Given moderately increased albuminuria and history of CKD, would recommend starting empagliflozin 10 mg. Discussed possible side effects and Kendra Vega was amenable. Staff message sent to patient's nephrologist, Dr. Hollie Salk. Liraglutide prescribed at last office visit not on formulary. Kendra Vega is exploring options through her [employer]. Will continue to monitor elevated lymphoctyes, discussed smoking cessation.

## 2023-01-19 LAB — CYTOLOGY - PAP
Comment: NEGATIVE
Diagnosis: NEGATIVE
Diagnosis: REACTIVE
High risk HPV: NEGATIVE

## 2023-01-25 ENCOUNTER — Other Ambulatory Visit: Payer: 59

## 2023-01-28 ENCOUNTER — Other Ambulatory Visit (HOSPITAL_COMMUNITY): Payer: Self-pay

## 2023-01-28 NOTE — Progress Notes (Signed)
Unable to reach Kendra Vega to review Pap results. NILM, HPV negative. Hyperkeratosis. Will plan for repeat in 6 months.

## 2023-01-29 ENCOUNTER — Telehealth: Payer: Self-pay

## 2023-01-29 ENCOUNTER — Other Ambulatory Visit: Payer: Self-pay

## 2023-01-29 ENCOUNTER — Other Ambulatory Visit (HOSPITAL_COMMUNITY): Payer: Self-pay

## 2023-01-29 NOTE — Telephone Encounter (Signed)
Pt is requesting a call back ... She is wanting to speak about her insulin she state the pharmacy told her that the victoza was not covered  and they have been reaching out to [place] .. I explained I have not received anything and to call the pharmacy for them to reach back out to [place] so I can do the PA .Marland Kitchen She still wants a Therapist, sports to call her back

## 2023-01-29 NOTE — Telephone Encounter (Signed)
Pt stated th pharmacy told her Victoza is not covered by her insurance.

## 2023-01-30 NOTE — Telephone Encounter (Signed)
I spoke with Kendra Vega about this over telephone encounter 01/18/23. Liraglutide not covered by insurance. Semaglutide caused rash. I have tried to call her again this morning but no answer. We have started alternative therapy for diabetes with empagliflozin due to proteinuria. I will continue to try and reach her to answer any further questions.

## 2023-01-31 ENCOUNTER — Telehealth: Payer: Self-pay | Admitting: Internal Medicine

## 2023-01-31 ENCOUNTER — Telehealth: Payer: Self-pay | Admitting: *Deleted

## 2023-01-31 ENCOUNTER — Other Ambulatory Visit (HOSPITAL_COMMUNITY): Payer: Self-pay

## 2023-01-31 NOTE — Telephone Encounter (Signed)
Call from patient states that she just gt off the phone with Dr. Saverio Danker and has forgotten the name of the medication that she had been changed to and that she had not gotten it from the Pharmacy for her Diabetes.  Call to [facility] patient was started on Jardiance and picked up a prescription for the Jardiance on 01/18/2023.  Patient was informed and located her bottle and expressed understanding about the change in her Diabetes medications.

## 2023-01-31 NOTE — Telephone Encounter (Signed)
Called Ms. Umstead to review plan to start empagliflozin for diabetes and CKD. She was interested in further weight loss therapy, semaglutide and liraglutide not options. Discussed referral to [facility] which she was interested in. Reviewed pap results.

## 2023-02-04 DIAGNOSIS — I639 Cerebral infarction, unspecified: Secondary | ICD-10-CM | POA: Diagnosis not present

## 2023-02-04 DIAGNOSIS — N2581 Secondary hyperparathyroidism of renal origin: Secondary | ICD-10-CM | POA: Diagnosis not present

## 2023-02-04 DIAGNOSIS — I701 Atherosclerosis of renal artery: Secondary | ICD-10-CM | POA: Diagnosis not present

## 2023-02-04 DIAGNOSIS — I129 Hypertensive chronic kidney disease with stage 1 through stage 4 chronic kidney disease, or unspecified chronic kidney disease: Secondary | ICD-10-CM | POA: Diagnosis not present

## 2023-02-04 DIAGNOSIS — Z720 Tobacco use: Secondary | ICD-10-CM | POA: Diagnosis not present

## 2023-02-04 DIAGNOSIS — D631 Anemia in chronic kidney disease: Secondary | ICD-10-CM | POA: Diagnosis not present

## 2023-02-04 DIAGNOSIS — N1831 Chronic kidney disease, stage 3a: Secondary | ICD-10-CM | POA: Diagnosis not present

## 2023-02-04 DIAGNOSIS — E1122 Type 2 diabetes mellitus with diabetic chronic kidney disease: Secondary | ICD-10-CM | POA: Diagnosis not present

## 2023-02-04 DIAGNOSIS — E785 Hyperlipidemia, unspecified: Secondary | ICD-10-CM | POA: Diagnosis not present

## 2023-02-04 DIAGNOSIS — N189 Chronic kidney disease, unspecified: Secondary | ICD-10-CM | POA: Diagnosis not present

## 2023-02-05 LAB — LAB REPORT - SCANNED
Albumin, Urine POC: 84.4
Albumin/Creatinine Ratio, Urine, POC: 184
Creatinine, Random Urine: 45.9
EGFR: 49
Protein/Creatinine Ratio: 468
Total Protein, Urine: 21.5

## 2023-02-07 ENCOUNTER — Other Ambulatory Visit: Payer: 59

## 2023-02-15 ENCOUNTER — Other Ambulatory Visit (HOSPITAL_COMMUNITY): Payer: Self-pay

## 2023-02-21 ENCOUNTER — Ambulatory Visit
Admission: RE | Admit: 2023-02-21 | Discharge: 2023-02-21 | Disposition: A | Discharge status: Home / Self Care | Payer: 59 | Source: Ambulatory Visit | Attending: Internal Medicine | Admitting: Internal Medicine

## 2023-02-21 DIAGNOSIS — R928 Other abnormal and inconclusive findings on diagnostic imaging of breast: Secondary | ICD-10-CM

## 2023-02-21 DIAGNOSIS — N6011 Diffuse cystic mastopathy of right breast: Secondary | ICD-10-CM | POA: Diagnosis not present

## 2023-03-21 ENCOUNTER — Other Ambulatory Visit (HOSPITAL_COMMUNITY): Payer: Self-pay

## 2023-03-21 ENCOUNTER — Other Ambulatory Visit: Payer: Self-pay | Admitting: Internal Medicine

## 2023-03-21 MED ORDER — CLOPIDOGREL BISULFATE 75 MG PO TABS
75.0000 mg | ORAL_TABLET | Freq: Every day | ORAL | 3 refills | Status: DC
  Filled 2023-03-21: qty 90, 90d supply, fill #0
  Filled 2023-06-06: qty 90, 90d supply, fill #1
  Filled 2023-09-04: qty 90, 90d supply, fill #2
  Filled 2023-12-29: qty 90, 90d supply, fill #3

## 2023-03-21 MED ORDER — FUROSEMIDE 80 MG PO TABS
80.0000 mg | ORAL_TABLET | Freq: Every day | ORAL | 3 refills | Status: DC
  Filled 2023-03-21: qty 90, 90d supply, fill #0
  Filled 2023-06-24: qty 90, 90d supply, fill #1
  Filled 2023-09-26: qty 90, 90d supply, fill #2
  Filled 2023-12-02 – 2023-12-29 (×2): qty 90, 90d supply, fill #3

## 2023-03-21 MED ORDER — ATORVASTATIN CALCIUM 40 MG PO TABS
40.0000 mg | ORAL_TABLET | Freq: Every day | ORAL | 3 refills | Status: DC
  Filled 2023-03-21: qty 90, 90d supply, fill #0
  Filled 2023-05-31: qty 100, 100d supply, fill #1
  Filled 2023-09-26: qty 100, 100d supply, fill #2

## 2023-03-29 ENCOUNTER — Other Ambulatory Visit (HOSPITAL_COMMUNITY): Payer: Self-pay

## 2023-04-15 ENCOUNTER — Encounter: Payer: 59 | Admitting: Internal Medicine

## 2023-04-25 ENCOUNTER — Other Ambulatory Visit: Payer: Self-pay | Admitting: Internal Medicine

## 2023-04-26 ENCOUNTER — Other Ambulatory Visit (HOSPITAL_COMMUNITY): Payer: Self-pay

## 2023-04-26 MED ORDER — AMLODIPINE BESYLATE 10 MG PO TABS
10.0000 mg | ORAL_TABLET | Freq: Every day | ORAL | 3 refills | Status: DC
  Filled 2023-04-26: qty 90, 90d supply, fill #0
  Filled 2023-09-04: qty 90, 90d supply, fill #1
  Filled 2023-12-02: qty 90, 90d supply, fill #2
  Filled 2024-03-09: qty 90, 90d supply, fill #3

## 2023-05-31 ENCOUNTER — Other Ambulatory Visit: Payer: Self-pay | Admitting: Internal Medicine

## 2023-05-31 ENCOUNTER — Other Ambulatory Visit (HOSPITAL_COMMUNITY): Payer: Self-pay

## 2023-05-31 DIAGNOSIS — I1A0 Resistant hypertension: Secondary | ICD-10-CM

## 2023-05-31 MED ORDER — LISINOPRIL 40 MG PO TABS
40.0000 mg | ORAL_TABLET | Freq: Every day | ORAL | 11 refills | Status: DC
Start: 2023-05-31 — End: 2024-06-09
  Filled 2023-05-31: qty 100, 100d supply, fill #0
  Filled 2023-05-31: qty 30, 30d supply, fill #0
  Filled 2023-09-04: qty 100, 100d supply, fill #1
  Filled 2023-12-02: qty 100, 100d supply, fill #2
  Filled 2024-04-02: qty 100, 100d supply, fill #3

## 2023-05-31 NOTE — Telephone Encounter (Signed)
Next appt scheduled 7/30 with PCP. 

## 2023-06-05 ENCOUNTER — Other Ambulatory Visit (HOSPITAL_COMMUNITY): Payer: Self-pay

## 2023-06-25 ENCOUNTER — Encounter: Payer: Self-pay | Admitting: Internal Medicine

## 2023-06-25 ENCOUNTER — Other Ambulatory Visit: Payer: Self-pay

## 2023-06-25 ENCOUNTER — Ambulatory Visit: Payer: 59 | Admitting: Internal Medicine

## 2023-06-25 ENCOUNTER — Other Ambulatory Visit (HOSPITAL_COMMUNITY): Payer: Self-pay

## 2023-06-25 VITALS — BP 114/71 | HR 80 | Temp 98.3°F | Ht 65.0 in | Wt 206.2 lb

## 2023-06-25 DIAGNOSIS — I1A0 Resistant hypertension: Secondary | ICD-10-CM | POA: Diagnosis not present

## 2023-06-25 DIAGNOSIS — G8929 Other chronic pain: Secondary | ICD-10-CM

## 2023-06-25 DIAGNOSIS — Z6834 Body mass index (BMI) 34.0-34.9, adult: Secondary | ICD-10-CM

## 2023-06-25 DIAGNOSIS — F17200 Nicotine dependence, unspecified, uncomplicated: Secondary | ICD-10-CM

## 2023-06-25 DIAGNOSIS — N951 Menopausal and female climacteric states: Secondary | ICD-10-CM | POA: Diagnosis not present

## 2023-06-25 DIAGNOSIS — K219 Gastro-esophageal reflux disease without esophagitis: Secondary | ICD-10-CM | POA: Diagnosis not present

## 2023-06-25 DIAGNOSIS — E6609 Other obesity due to excess calories: Secondary | ICD-10-CM

## 2023-06-25 DIAGNOSIS — E1122 Type 2 diabetes mellitus with diabetic chronic kidney disease: Secondary | ICD-10-CM | POA: Diagnosis not present

## 2023-06-25 DIAGNOSIS — Z124 Encounter for screening for malignant neoplasm of cervix: Secondary | ICD-10-CM

## 2023-06-25 DIAGNOSIS — M654 Radial styloid tenosynovitis [de Quervain]: Secondary | ICD-10-CM | POA: Diagnosis not present

## 2023-06-25 DIAGNOSIS — F418 Other specified anxiety disorders: Secondary | ICD-10-CM

## 2023-06-25 DIAGNOSIS — F17210 Nicotine dependence, cigarettes, uncomplicated: Secondary | ICD-10-CM

## 2023-06-25 DIAGNOSIS — N1832 Chronic kidney disease, stage 3b: Secondary | ICD-10-CM | POA: Diagnosis not present

## 2023-06-25 DIAGNOSIS — E1121 Type 2 diabetes mellitus with diabetic nephropathy: Secondary | ICD-10-CM

## 2023-06-25 LAB — POCT GLYCOSYLATED HEMOGLOBIN (HGB A1C): Hemoglobin A1C: 5.7 % — AB (ref 4.0–5.6)

## 2023-06-25 LAB — GLUCOSE, CAPILLARY: Glucose-Capillary: 129 mg/dL — ABNORMAL HIGH (ref 70–99)

## 2023-06-25 MED ORDER — DICLOFENAC SODIUM 1 % EX GEL
4.0000 g | Freq: Four times a day (QID) | CUTANEOUS | 1 refills | Status: AC
Start: 2023-06-25 — End: ?
  Filled 2023-06-25: qty 100, 7d supply, fill #0
  Filled 2023-12-02: qty 100, 7d supply, fill #1

## 2023-06-25 NOTE — Patient Instructions (Addendum)
It was wonderful to see you today!  For your hand, please wear the splint and use Voltaren gel to the painful area. You may take Tylenol 1000 mg every 8 hours as needed for pain.  We'll see you back in 1-2 months for a repeat Pap smear.  Keep up the weight loss and healthy lifestyle.

## 2023-06-25 NOTE — Progress Notes (Unsigned)
   Established Patient Office Visit  Subjective   Patient ID: Kendra Vega, female    DOB: [birth year]  Age: 52 y.o. MRN: [id number]  No chief complaint on file.   HPI  4-5 months, same. Brace helps, but helps some. When you wake up numb. Weak and painful. No injury. Cashier hurts.   2 tablets tablet/day >  Splint, tylenol, and voltaren gel  Patient Active Problem List   Diagnosis Date Noted   Renal artery stenosis (HCC) 01/14/2023   Elevated lymphocytes 01/14/2023   Chronic pain of left knee 01/14/2023   Perimenopausal vasomotor symptoms 01/14/2023   Cervical cancer screening 01/14/2023   Eczema 07/09/2022   Venous insufficiency of left leg 04/30/2022   Screening for malignant neoplasm of colon 04/30/2022   Chronic bilateral low back pain without sciatica 04/03/2022   Restless leg 03/15/2020   Type 2 diabetes mellitus without complication, without long-term current use of insulin (HCC) 10/21/2018   HLD (hyperlipidemia) 08/03/2017   History of CVA (cerebrovascular accident) 10/28/2016   Stage 3b chronic kidney disease (HCC) 10/28/2016   OBESITY 01/19/2011   GASTROESOPHAGEAL REFLUX DISEASE 08/10/2010   Depression with anxiety 06/27/2010   Smoker 06/27/2010   Resistant hypertension 06/07/2010   CRAO (central retinal artery occlusion), right 06/07/2010      ROS    Objective:     There were no vitals taken for this visit. BP Readings from Last 3 Encounters:  01/14/23 129/75  01/14/23 129/75  08/06/22 (!) 141/81   Wt Readings from Last 3 Encounters:  01/14/23 219 lb 4.4 oz (99.5 kg)  01/14/23 219 lb 14.4 oz (99.7 kg)  08/06/22 223 lb 8 oz (101.4 kg)      Physical Exam   No results found for any visits on 06/25/23.  {Labs (Optional):23779}  The ASCVD Risk score (Arnett DK, et al., 2019) failed to calculate for the following reasons:   The patient has a prior MI or stroke diagnosis    Assessment & Plan:   Problem List Items Addressed This Visit        Endocrine   Type 2 diabetes mellitus without complication, without long-term current use of insulin (HCC) - Primary   Relevant Orders   POC Hbg A1C    No follow-ups on file.    Dickie La, MD

## 2023-06-26 DIAGNOSIS — M654 Radial styloid tenosynovitis [de Quervain]: Secondary | ICD-10-CM | POA: Insufficient documentation

## 2023-06-26 NOTE — Assessment & Plan Note (Signed)
Trial of gabapentin ordered at last visit for VMS. Kendra Vega reports this medication made her feel "crazy" and she has discontinued. She is managing well with a fan and AC at this time and is not interested in further medication options.

## 2023-06-26 NOTE — Assessment & Plan Note (Signed)
Pap screening at last visit with evidence of hyperkeratosis, repeat Pap recommended in 6 months. Kendra Vega prefers to wait until next visit to complete. We will schedule f/u in 4-6 weeks for Pap.

## 2023-06-26 NOTE — Assessment & Plan Note (Signed)
Continued weight loss improvement with lifestyle changes. BMI down to 34. No longer on GLP-1 RA therapy. Referred to [facility] at last office visit but Kendra Vega prefers to keep going on her own. Continue to monitor.

## 2023-06-26 NOTE — Assessment & Plan Note (Signed)
Blood pressure at goal, 114/71. Will continue current regimen. Following with [facility], Dr. Ronalee Belts, with labs stable 01/2023. Next visit scheduled for 07/2023.  Plan -Continue lisinopril 40 mg, amlodipine 10 mg, and furosemide 80 mg daily. Labetalol 200 mg bid.

## 2023-06-26 NOTE — Assessment & Plan Note (Addendum)
Continues to smoke 2 Black & Milds/day. She is not interested in pharmacologic assistance. She is trying to stay busy in order to avoid smoking habit. She and her sisters will be doing a book club and she hopes reading will take the place of smoking. We will continue to address at future visits.

## 2023-06-26 NOTE — Assessment & Plan Note (Signed)
Follows with [facility], Dr. Ronalee Belts. Labs 01/2023 with Cr 1.31, eGFR 49. ACR 184. Phos 4.6. F/u scheduled 07/2023.

## 2023-06-26 NOTE — Assessment & Plan Note (Signed)
Improved GERD symptoms with increase to pantoprazole 40 mg twice daily at last visit. We discussed trying to taper down at next visit.

## 2023-06-26 NOTE — Assessment & Plan Note (Addendum)
PHQ-9 stable at 7 today. Kendra Vega recently lost her mother suddenly and this brought up memories of her son's sudden death. She is managing okay but plans to establish with a grief counselor at [facility]. Reviewed medications, currently on fluoxetine and Seroquel from [facility]. No changes today. Agree with counseling.

## 2023-06-26 NOTE — Assessment & Plan Note (Signed)
A1c stable at 5.7%. Started on empagliflozin 10 mg following last visit. Kendra Vega continues to make dietary changes for weight loss and has successfully lost an additional 13 pounds since last visit! Foot exam completed today. Urine ACR at nephrology 01/2023 184. Remains on statin.  Plan -Continue empagliflozin 10 mg daily -F/u eye exam at next visit

## 2023-06-26 NOTE — Assessment & Plan Note (Signed)
Right wrist pain for the past 4-5 months. No significant worsening or improvement over that time. She has worn a brace that helps some. She notes some numbness over the wrist upon awakening in the morning but resolves within minutes. No injury or trauma to the area. 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 Grip feels a little weak, mostly due to pain. On exam, no redness or swelling of the right wrist, forearm, or hand. No TTP over the [facility] joint or hand. Negative Tinel's. Significant TTP over the radial wrist, positive Finkelstein test. Consistent with De Quervain's tenosynovitis. A thumb spica splint was placed by ortho techs. We discussed avoidance of oral NSAIDs given history of CKD, however she may try topical diclofenac and continue Tylenol use. If no significant improvement with these options, referral to hand surgery for steroid injection would be the next option. F/u at next visit in 4-6 weeks.

## 2023-07-19 ENCOUNTER — Ambulatory Visit (INDEPENDENT_AMBULATORY_CARE_PROVIDER_SITE_OTHER): Payer: 59 | Admitting: Podiatry

## 2023-07-19 DIAGNOSIS — Q828 Other specified congenital malformations of skin: Secondary | ICD-10-CM | POA: Diagnosis not present

## 2023-07-19 NOTE — Progress Notes (Signed)
  Subjective:  Patient ID: Kendra Vega, female    DOB: [birth year],  MRN: [id number]  Chief Complaint  Patient presents with   Callouses    52 y.o. female presents with the above complaint. History confirmed with patient.  They have been very helpful.  She would like to get these under control.  She states she does not have diabetes and has not been on medication for this  Objective:  Physical Exam: warm, good capillary refill, no trophic changes or ulcerative lesions, normal DP and PT pulses, and normal sensory exam.  Bilaterally she has multiple large porokeratotic lesions the most painful which is in the mid arch Assessment:   1. Porokeratosis       Plan:  Patient was evaluated and treated and all questions answered.  I discussed treatment of these from an debridement, pedicures and surgical excision.  Discussed that surgical excision is often unsuccessful and leads to recurrence or painful scars.  I discussed using a keratolytic agent such as urea cream and she will begin using this as well as her medication.  I discussed with her that unfortunate she does not qualify under Medicare guidelines for intermittent debridement but she does not have any qualifying conditions for this which she understands.  She will continue to work on this with urea cream and self debridement at home and return for follow-up needed  No follow-ups on file.

## 2023-08-21 ENCOUNTER — Encounter: Payer: Self-pay | Admitting: Internal Medicine

## 2023-09-04 ENCOUNTER — Other Ambulatory Visit: Payer: Self-pay

## 2023-09-04 ENCOUNTER — Other Ambulatory Visit (HOSPITAL_COMMUNITY): Payer: Self-pay

## 2023-09-04 ENCOUNTER — Other Ambulatory Visit: Payer: Self-pay | Admitting: Internal Medicine

## 2023-09-04 MED ORDER — LABETALOL HCL 200 MG PO TABS
200.0000 mg | ORAL_TABLET | Freq: Two times a day (BID) | ORAL | 11 refills | Status: DC
  Filled 2023-09-04: qty 60, 30d supply, fill #0
  Filled 2023-10-22: qty 60, 30d supply, fill #1
  Filled 2023-12-02: qty 60, 30d supply, fill #2
  Filled 2024-02-16: qty 60, 30d supply, fill #3
  Filled 2024-04-02: qty 60, 30d supply, fill #4
  Filled 2024-05-19: qty 60, 30d supply, fill #5
  Filled 2024-06-24: qty 60, 30d supply, fill #6
  Filled 2024-07-19: qty 60, 30d supply, fill #7
  Filled 2024-08-19: qty 60, 30d supply, fill #8

## 2023-09-09 ENCOUNTER — Other Ambulatory Visit: Payer: Self-pay

## 2023-09-09 DIAGNOSIS — I1A0 Resistant hypertension: Secondary | ICD-10-CM

## 2023-09-09 DIAGNOSIS — I701 Atherosclerosis of renal artery: Secondary | ICD-10-CM

## 2023-09-13 ENCOUNTER — Other Ambulatory Visit (HOSPITAL_COMMUNITY): Payer: Self-pay

## 2023-09-13 ENCOUNTER — Other Ambulatory Visit: Payer: Self-pay | Admitting: Internal Medicine

## 2023-09-13 DIAGNOSIS — L309 Dermatitis, unspecified: Secondary | ICD-10-CM

## 2023-09-17 ENCOUNTER — Other Ambulatory Visit (HOSPITAL_COMMUNITY): Payer: Self-pay

## 2023-09-17 MED ORDER — TRIAMCINOLONE ACETONIDE 0.025 % EX OINT
1.0000 | TOPICAL_OINTMENT | Freq: Two times a day (BID) | CUTANEOUS | 0 refills | Status: AC | PRN
  Filled 2023-09-17: qty 30, 15d supply, fill #0

## 2023-09-18 DIAGNOSIS — Z720 Tobacco use: Secondary | ICD-10-CM | POA: Diagnosis not present

## 2023-09-18 DIAGNOSIS — E1122 Type 2 diabetes mellitus with diabetic chronic kidney disease: Secondary | ICD-10-CM | POA: Diagnosis not present

## 2023-09-18 DIAGNOSIS — I701 Atherosclerosis of renal artery: Secondary | ICD-10-CM | POA: Diagnosis not present

## 2023-09-18 DIAGNOSIS — N1831 Chronic kidney disease, stage 3a: Secondary | ICD-10-CM | POA: Diagnosis not present

## 2023-09-18 DIAGNOSIS — I129 Hypertensive chronic kidney disease with stage 1 through stage 4 chronic kidney disease, or unspecified chronic kidney disease: Secondary | ICD-10-CM | POA: Diagnosis not present

## 2023-09-19 LAB — LAB REPORT - SCANNED
Albumin, Urine POC: 33.2
EGFR: 40
Microalb Creat Ratio: 49

## 2023-09-23 ENCOUNTER — Ambulatory Visit (HOSPITAL_COMMUNITY): Payer: 59

## 2023-09-23 ENCOUNTER — Ambulatory Visit: Payer: 59

## 2023-09-26 ENCOUNTER — Other Ambulatory Visit (HOSPITAL_COMMUNITY): Payer: Self-pay

## 2023-10-21 ENCOUNTER — Telehealth: Payer: Self-pay

## 2023-10-21 NOTE — Patient Outreach (Signed)
Attempted to contact patient regarding care gaps. Left voicemail for patient to return my call at [phone].  Nicholes Rough, CMA Care Guide [employer]

## 2023-12-02 ENCOUNTER — Other Ambulatory Visit (HOSPITAL_COMMUNITY): Payer: Self-pay

## 2023-12-02 ENCOUNTER — Other Ambulatory Visit: Payer: Self-pay

## 2023-12-02 ENCOUNTER — Ambulatory Visit: Payer: 59

## 2023-12-02 ENCOUNTER — Ambulatory Visit (HOSPITAL_COMMUNITY): Payer: 59

## 2023-12-02 NOTE — Progress Notes (Deleted)
 HISTORY AND PHYSICAL     CC:  follow up Requesting Provider:  Karna Fellows, MD  HPI: Kendra Vega is a 53 y.o. (03-27-1971) female who presents  with hx of left renal artery stenting on 07/03/2022 by Dr. Serene.    She was seen back on 08/06/2022 and at that time, her BP was improved.    The pt is on a statin for cholesterol management.  The pt is on a daily aspirin .   Other AC:  Plavix  The pt is on CCB, ACEI, diuretic, BB for hypertension.   The pt is diabetic.   Tobacco hx:  ***  Pt does *** have family hx of AAA.  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   Breast mass    rt breast ?abcess x's 1 week   Chronic kidney disease    Dental caries    Depression    GERD (gastroesophageal reflux disease)    Healthcare maintenance 09/20/2020   Hypertension    Otitis externa of right ear 01/24/2022   Stroke (HCC)    Type 2 diabetes mellitus without complication, without long-term current use of insulin  (HCC) 10/21/2018   WARTS, HAND 01/19/2011   Qualifier: Diagnosis of  By: Gladis FNP, Delorise      Past Surgical History:  Procedure Laterality Date   BREAST CYST ASPIRATION Right 08/06/2017   breast abcess aspirated   MULTIPLE TOOTH EXTRACTIONS     PERIPHERAL VASCULAR INTERVENTION Left 07/03/2022   Procedure: PERIPHERAL VASCULAR INTERVENTION;  Surgeon: Serene Gaile ORN, MD;  Location: [facility] INVASIVE CV LAB;  Service: Cardiovascular;  Laterality: Left;  left renal   RENAL ANGIOGRAPHY Right 07/03/2022   Procedure: RENAL ANGIOGRAPHY;  Surgeon: Serene Gaile ORN, MD;  Location: [facility] INVASIVE CV LAB;  Service: Cardiovascular;  Laterality: Right;   TOOTH EXTRACTION N/A 12/11/2019   Procedure: EXTRACTION MOLARS;  Surgeon: Sheryle Hamilton, DDS;  Location: [facility] OR;  Service: Oral Surgery;  Laterality: N/A;    Social History   Socioeconomic History   Marital status: Significant Other    Spouse name: Not on file   Number of  children: 1   Years of education: Not on file   Highest education level: 10th grade  Occupational History   Not on file  Tobacco Use   Smoking status: Every Day    Current packs/day: 0.20    Average packs/day: 0.2 packs/day for 7.0 years (1.4 ttl pk-yrs)    Types: Cigarettes   Smokeless tobacco: Never   Tobacco comments:    smokes 2 daily  Black and Milds    Vaping Use   Vaping status: Never Used  Substance and Sexual Activity   Alcohol use: Not Currently   Drug use: Yes    Frequency: 1.0 times per week    Types: Marijuana    Comment: occasional marijuana    Sexual activity: Not Currently    Partners: Male    Birth control/protection: None  Other Topics Concern   Not on file  Social History Narrative   Current Social History 06/21/2021        Patient lives with a significant other in an apartment which is 1 story/stories. There are not steps up to the entrance the patient uses.       Patient's method of transportation is via family member or public transportation if needed.      The highest level of education was  some high school.      The patient currently disabled but would like part time job.       Identified important Relationships are my dogs      Pets : 2       Interests / Fun: I love to shop       Current Stressors: son's passing and her bills      Religious / Personal Beliefs: I just believe in Jesus Christ        Social Drivers of Health   Financial Resource Strain: High Risk (01/14/2023)   Overall Financial Resource Strain (CARDIA)    Difficulty of Paying Living Expenses: Very hard  Food Insecurity: Food Insecurity Present (01/14/2023)   Hunger Vital Sign    Worried About Running Out of Food in the Last Year: Sometimes true    Ran Out of Food in the Last Year: Often true  Transportation Needs: Unmet Transportation Needs (01/14/2023)   PRAPARE - Administrator, Civil Service (Medical): Yes    Lack of Transportation (Non-Medical): Yes   Physical Activity: Inactive (01/14/2023)   Exercise Vital Sign    Days of Exercise per Week: 0 days    Minutes of Exercise per Session: 0 min  Stress: Stress Concern Present (01/14/2023)   [employer] of Occupational Health - Occupational Stress Questionnaire    Feeling of Stress : Very much  Social Connections: Unknown (01/14/2023)   Social Connection and Isolation Panel [NHANES]    Frequency of Communication with Friends and Family: Three times a week    Frequency of Social Gatherings with Friends and Family: Patient declined    Attends Religious Services: Not on Marketing Executive or Organizations: No    Attends Banker Meetings: Never    Marital Status: Divorced  Catering Manager Violence: Not At Risk (01/14/2023)   Humiliation, Afraid, Rape, and Kick questionnaire    Fear of Current or Ex-Partner: No    Emotionally Abused: No    Physically Abused: No    Sexually Abused: No    *** Family History  Problem Relation Age of Onset   Kidney disease Mother    Diabetes Mother    Hypertension Mother    Hypertension Sister    Hypertension Sister    Hypertension Sister    Heart attack Son     Current Outpatient Medications  Medication Sig Dispense Refill   Accu-Chek Softclix Lancets lancets Use up to 4 (four) times daily. 100 each 0   acetaminophen  (TYLENOL ) 500 MG tablet Take 1,000 mg by mouth every 8 (eight) hours as needed for moderate pain.     amLODipine  (NORVASC ) 10 MG tablet Take 1 tablet (10 mg total) by mouth daily. 90 tablet 3   aspirin  EC 81 MG tablet Take 81 mg by mouth daily. Swallow whole.     atorvastatin  (LIPITOR) 40 MG tablet Take 1 tablet (40 mg total) by mouth daily. 90 tablet 3   Biotin 10000 MCG TABS Take 1 tablet by mouth daily.     blood glucose meter kit and supplies Use up to 4 (four) times daily. 1 each 0   clopidogrel  (PLAVIX ) 75 MG tablet Take 1 tablet (75 mg total) by mouth daily. 90 tablet 3   diclofenac  Sodium  (VOLTAREN ) 1 % GEL Apply 4 grams topically 4 (four) times daily. 100 g 1   empagliflozin  (JARDIANCE ) 10 MG TABS tablet Take 1 tablet (10 mg total) by mouth daily. 30  tablet 11   FLUoxetine  (PROZAC ) 40 MG capsule Take 1 capsule (40 mg total) by mouth daily. 90 capsule 2   furosemide  (LASIX ) 80 MG tablet Take 1 tablet (80 mg total) by mouth daily. 90 tablet 3   glucose blood test strip Use up to 4 (four) times daily. 100 each 0   labetalol  (NORMODYNE ) 200 MG tablet Take 1 tablet (200 mg total) by mouth 2 (two) times daily. 60 tablet 11   Lancets (ONETOUCH DELICA PLUS LANCET33G) MISC Use up to 4 (four) times daily. 100 each 0   lisinopril  (ZESTRIL ) 40 MG tablet Take 1 tablet (40 mg total) by mouth daily. 30 tablet 11   Omega-3 Fatty Acids (FISH OIL) 1000 MG CAPS Take 1,000 mg by mouth daily.     pantoprazole  (PROTONIX ) 40 MG tablet Take 1 tablet (40 mg total) by mouth 2 (two) times daily. 180 tablet 3   QUEtiapine (SEROQUEL XR) 300 MG 24 hr tablet Take 300 mg by mouth at bedtime.     sennosides-docusate sodium  (SENOKOT-S) 8.6-50 MG tablet Take 1 tablet by mouth daily.     simethicone  (MYLICON) 125 MG chewable tablet Chew 125 mg by mouth every 6 (six) hours as needed for flatulence.     triamcinolone  (KENALOG ) 0.025 % ointment Apply 1 Application topically 2 (two) times daily as needed (eczema). 30 g 0   No current facility-administered medications for this visit.    No Known Allergies   REVIEW OF SYSTEMS:  *** [X]  denotes positive finding, [ ]  denotes negative finding Cardiac  Comments:  Chest pain or chest pressure:    Shortness of breath upon exertion:    Short of breath when lying flat:    Irregular heart rhythm:        Vascular    Pain in calf, thigh, or hip brought on by ambulation:    Pain in feet at night that wakes you up from your sleep:     Blood clot in your veins:    Leg swelling:         Pulmonary    Oxygen at home:    Productive cough:     Wheezing:          Neurologic    Sudden weakness in arms or legs:     Sudden numbness in arms or legs:     Sudden onset of difficulty speaking or slurred speech:    Temporary loss of vision in one eye:     Problems with dizziness:         Gastrointestinal    Blood in stool:     Vomited blood:         Genitourinary    Burning when urinating:     Blood in urine:        Psychiatric    Major depression:         Hematologic    Bleeding problems:    Problems with blood clotting too easily:        Skin    Rashes or ulcers:        Constitutional    Fever or chills:      PHYSICAL EXAMINATION:  ***  General:  WDWN in NAD; vital signs documented above Gait: Not observed HENT: WNL, normocephalic Pulmonary: normal non-labored breathing Cardiac: {Desc; regular/irreg:14544} HR, {With/Without:20273} carotid bruit*** Abdomen: soft, NT; aortic pulse is *** palpable Skin: {With/Without:20273} rashes Vascular Exam/Pulses:  Right Left  Radial {Exam; arterial pulse strength 0-4:30167} {Exam; arterial pulse strength  0-4:30167}  Femoral {Exam; arterial pulse strength 0-4:30167} {Exam; arterial pulse strength 0-4:30167}  Popliteal {Exam; arterial pulse strength 0-4:30167} {Exam; arterial pulse strength 0-4:30167}  DP {Exam; arterial pulse strength 0-4:30167} {Exam; arterial pulse strength 0-4:30167}  PT {Exam; arterial pulse strength 0-4:30167} {Exam; arterial pulse strength 0-4:30167}   Extremities: {With/Without:20273} open wounds;  Musculoskeletal: no muscle wasting or atrophy  Neurologic: A&O X 3 Psychiatric:  The pt has {Desc; normal/abnormal:11317::Normal} affect.   Non-Invasive Vascular Imaging on 12/02/2023:   ***    ASSESSMENT/PLAN:: 53 y.o. female here for follow up for  left renal artery stenting on 07/03/2022 by Dr. Serene.     -*** -continue asa/statin/plavix    Lucie Apt, [facility] [phone]  Clinic MD:   Serene

## 2023-12-10 NOTE — Telephone Encounter (Signed)
 Opened in Error.

## 2023-12-13 ENCOUNTER — Other Ambulatory Visit (HOSPITAL_COMMUNITY): Payer: Self-pay

## 2023-12-17 ENCOUNTER — Other Ambulatory Visit (HOSPITAL_COMMUNITY): Payer: Self-pay

## 2023-12-17 ENCOUNTER — Ambulatory Visit (INDEPENDENT_AMBULATORY_CARE_PROVIDER_SITE_OTHER): Payer: 59 | Admitting: Internal Medicine

## 2023-12-17 ENCOUNTER — Encounter: Payer: Self-pay | Admitting: Internal Medicine

## 2023-12-17 VITALS — BP 157/89 | HR 71 | Temp 98.3°F | Ht 65.0 in | Wt 211.2 lb

## 2023-12-17 DIAGNOSIS — Z6835 Body mass index (BMI) 35.0-35.9, adult: Secondary | ICD-10-CM

## 2023-12-17 DIAGNOSIS — I701 Atherosclerosis of renal artery: Secondary | ICD-10-CM | POA: Diagnosis not present

## 2023-12-17 DIAGNOSIS — Z124 Encounter for screening for malignant neoplasm of cervix: Secondary | ICD-10-CM

## 2023-12-17 DIAGNOSIS — F17210 Nicotine dependence, cigarettes, uncomplicated: Secondary | ICD-10-CM

## 2023-12-17 DIAGNOSIS — F17200 Nicotine dependence, unspecified, uncomplicated: Secondary | ICD-10-CM

## 2023-12-17 DIAGNOSIS — E1122 Type 2 diabetes mellitus with diabetic chronic kidney disease: Secondary | ICD-10-CM

## 2023-12-17 DIAGNOSIS — E1121 Type 2 diabetes mellitus with diabetic nephropathy: Secondary | ICD-10-CM

## 2023-12-17 DIAGNOSIS — K219 Gastro-esophageal reflux disease without esophagitis: Secondary | ICD-10-CM

## 2023-12-17 DIAGNOSIS — R0981 Nasal congestion: Secondary | ICD-10-CM | POA: Insufficient documentation

## 2023-12-17 DIAGNOSIS — N1832 Chronic kidney disease, stage 3b: Secondary | ICD-10-CM

## 2023-12-17 DIAGNOSIS — Z1211 Encounter for screening for malignant neoplasm of colon: Secondary | ICD-10-CM

## 2023-12-17 DIAGNOSIS — I1A0 Resistant hypertension: Secondary | ICD-10-CM | POA: Diagnosis not present

## 2023-12-17 DIAGNOSIS — L84 Corns and callosities: Secondary | ICD-10-CM

## 2023-12-17 DIAGNOSIS — M654 Radial styloid tenosynovitis [de Quervain]: Secondary | ICD-10-CM

## 2023-12-17 DIAGNOSIS — E785 Hyperlipidemia, unspecified: Secondary | ICD-10-CM

## 2023-12-17 DIAGNOSIS — D72820 Lymphocytosis (symptomatic): Secondary | ICD-10-CM

## 2023-12-17 DIAGNOSIS — F418 Other specified anxiety disorders: Secondary | ICD-10-CM

## 2023-12-17 DIAGNOSIS — Z1231 Encounter for screening mammogram for malignant neoplasm of breast: Secondary | ICD-10-CM

## 2023-12-17 LAB — POCT GLYCOSYLATED HEMOGLOBIN (HGB A1C): Hemoglobin A1C: 5.8 % — AB (ref 4.0–5.6)

## 2023-12-17 LAB — GLUCOSE, CAPILLARY: Glucose-Capillary: 96 mg/dL (ref 70–99)

## 2023-12-17 MED ORDER — SALICYLIC ACID 40 % EX PADS
1.0000 | MEDICATED_PAD | CUTANEOUS | 0 refills | Status: AC | PRN
  Filled 2023-12-17: qty 14, fill #0

## 2023-12-17 MED ORDER — FLUTICASONE PROPIONATE 50 MCG/ACT NA SUSP
2.0000 | Freq: Every day | NASAL | 2 refills | Status: AC
  Filled 2023-12-17: qty 16, 30d supply, fill #0

## 2023-12-17 NOTE — Assessment & Plan Note (Signed)
Pap screening 2/204 with evidence of hyperkeratosis, repeat Pap recommended in 6 months. Kendra Vega wishes to wait today, we discussed the importance of completing this exam. F/u in 4 weeks.

## 2023-12-17 NOTE — Assessment & Plan Note (Signed)
Brought in FIT kit today for CRC screening. F/u results.

## 2023-12-17 NOTE — Assessment & Plan Note (Signed)
PHQ9 stable at 8. Kendra Vega father has recently had some health issues and she is quite stressed from this. She is managing okay but would like to work with a Community education officer. She spoke with a grief counselor at [facility] but notes no further behavioral counseling available there. Remains on fluoxetine and quetiapine through [facility].   Plan -Referral to [facility] LCSW, behavioral health resources

## 2023-12-17 NOTE — Progress Notes (Signed)
Established Patient Office Visit  Subjective   Patient ID: Kendra Vega, female    DOB: [birth year]  Age: 53 y.o. MRN: [id number]  Chief Complaint  Patient presents with   Plantar Warts    Bilateral feet- difficulty walking.   Medical Management of Chronic Issues   Ms. Hanback returns to clinic today for f/u of chronic medical conditions, as well to discuss painful left foot lesion, sinus congestion, and right wrist pain. Please see assessment/plan in problem-based charting for further details of today's visit.     Patient Active Problem List   Diagnosis Date Noted   Corn of foot 12/17/2023   Sinus congestion 12/17/2023   Tenosynovitis, de Quervain 06/26/2023   Renal artery stenosis (HCC) 01/14/2023   Elevated lymphocytes 01/14/2023   Chronic pain of left knee 01/14/2023   Perimenopausal vasomotor symptoms 01/14/2023   Cervical cancer screening 01/14/2023   Eczema 07/09/2022   Venous insufficiency of left leg 04/30/2022   Screening for malignant neoplasm of colon 04/30/2022   Chronic bilateral low back pain without sciatica 04/03/2022   Restless leg 03/15/2020   Type 2 diabetes mellitus with diabetic nephropathy, without long-term current use of insulin (HCC) 10/21/2018   HLD (hyperlipidemia) 08/03/2017   History of CVA (cerebrovascular accident) 10/28/2016   Stage 3b chronic kidney disease (HCC) 10/28/2016   Morbid obesity (HCC) 01/19/2011   GASTROESOPHAGEAL REFLUX DISEASE 08/10/2010   Depression with anxiety 06/27/2010   Current smoker 06/27/2010   Resistant hypertension 06/07/2010   CRAO (central retinal artery occlusion), right 06/07/2010     Objective:     BP (!) 157/89 (BP Location: Right Arm, Patient Position: Sitting, Cuff Size: Normal)   Pulse 71   Temp 98.3 F (36.8 C) (Oral)   Ht 5\' 5"  (1.651 m)   Wt 211 lb 3.2 oz (95.8 kg)   SpO2 100% Comment: RA  BMI 35.15 kg/m  BP Readings from Last 3 Encounters:  12/17/23 (!) 157/89  06/25/23 114/71   01/14/23 129/75   Wt Readings from Last 3 Encounters:  12/17/23 211 lb 3.2 oz (95.8 kg)  06/25/23 206 lb 3.2 oz (93.5 kg)  01/14/23 219 lb 4.4 oz (99.5 kg)    Physical Exam Vitals reviewed.  Constitutional:      General: She is not in acute distress.    Appearance: Normal appearance. She is not ill-appearing.  HENT:     Left Ear: Tympanic membrane normal.     Nose:     Comments: Mild erythema of bilateral nasal turbinates Cardiovascular:     Rate and Rhythm: Normal rate and regular rhythm.     Heart sounds: Normal heart sounds.  Pulmonary:     Effort: Pulmonary effort is normal.  Musculoskeletal:        General: No swelling.  Skin:    General: Skin is warm.     Comments: Multiple, hyperkeratotic papules over bilateral lateral/medial/plantar feet. Left foot with painful, flat area with hyperkeratotic core.  Neurological:     Mental Status: She is alert.  Psychiatric:        Mood and Affect: Mood normal.        Behavior: Behavior normal.        Thought Content: Thought content normal.       Assessment & Plan:   Problem List Items Addressed This Visit       Cardiovascular and Mediastinum   Resistant hypertension   Blood pressure elevated at today's visit, 157/89. Ms. Barney has been adherent to  her medication regimen and blood pressure has been well controlled over the last year. She has been under significant stress from the recent poor health of her father. She got into an argument with her sister right before today's visit. She is also having pain from her left foot, addressed elsewhere. Denies chest pain, SOB, swelling, dizziness, syncope.  Given her extensive regimen, recent control, and current pain/stressors, we have discussed home monitoring and return in one month for repeat BP check.  Plan -Continue amlodipine 10, lasix 80, lisinopril 40, labetalol 200 mg bid -BMP at [facility] in 08/2023 stable -F/u in one month      Renal artery stenosis (HCC)   Follows with  vascular surgery, s/p stent 06/2022. Continues on DAPT for history of prior strokes. F/u with surgery planned 01/2024.  Plan -Continue DAPT -F/u with vascular surgery 01/2024        Respiratory   Sinus congestion   Sinus congestion and runny nose over the last week. No fevers. Exam with mildly erythematous nasal turbinates, clear ears, no sinus tenderness. Suspect viral rhinosinusitis. Recommend conservative treatment with nasal steroid.  Plan -Flonase daily -Return if sx worsening or not improving      Relevant Medications   fluticasone (FLONASE) 50 MCG/ACT nasal spray     Digestive   GASTROESOPHAGEAL REFLUX DISEASE   GERD continues to be well controlled with PPI twice daily. Ms. Eader notes her symptoms are worse when she forgets to take the second pill. Continue for now.  Plan -Pantoprazole bid        Endocrine   Type 2 diabetes mellitus with diabetic nephropathy, without long-term current use of insulin (HCC) - Primary   A1c stable at 5.8%. Continues to work on lifestyle changes for weight loss. Committed to restart this again after the holidays. Urine ACR at [facility] 08/2023 improved to 49. Remains on statin.   Plan -Continue empagliflozin 10 mg daily -Repeat A1c q31m -Referral for diabetes eye exam      Relevant Orders   POC Hbg A1C (Completed)   Glucose, capillary (Completed)   Ambulatory referral to Ophthalmology     Musculoskeletal and Integument   Tenosynovitis, de Quervain   No significant improvement in right wrist pain with splint, topical NSAIDs, and acetaminophen. Not able to use oral NSAIDs due to kidney function. Continued pain with palpation over the right radial wrist, positive Finkelstein. Amenable to hand surgery referral to consideration of injection.  Pla -Referral to hand surgery -Continue splint, topical NSAID, acetaminophen prn      Relevant Orders   Ambulatory referral to Hand Surgery   Corn of foot   Bilateral feet with multiple  hyperkeratotic papules over plantar, medial, and lateral surface. Large area on the left plantar surface has been causing significant pain since debridement by podiatry in August 2024. Ms. Postier reports this area bled and has been very painful since that time, causing her to have difficulty standing and walking for long periods. 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 Diagnosis in podiatry note of porokeratosis, however areas today appear as callus/corns. These do not appear consistent with warts. Given multiple in nature and history of diabetes, I do recommend f/u with podiatry, however Ms. Peter request referral to an alternative office. I have prescribed salicyclic acid pads for the painful left foot corn. Work note also provided to allow for seated work while we work on treating these areas.  Plan -Referral to podiatry -Salicyclic  acid pads      Relevant Medications   Salicylic Acid 40 % PADS   Other Relevant Orders   Ambulatory referral to Podiatry     Genitourinary   Stage 3b chronic kidney disease (HCC)   Follows with [facility], last visit 08/2023. Labs reviewed, stable Cr/eGFR. ACR improved. F/u planned q70m.  Plan -Continue lisinopril and empagliflozin -F/u with [facility] q10m, BMP/ACR obtained there        Other   Depression with anxiety   PHQ9 stable at 8. Ms. Easterday father has recently had some health issues and she is quite stressed from this. She is managing okay but would like to work with a Community education officer. She spoke with a grief counselor at [facility] but notes no further behavioral counseling available there. Remains on fluoxetine and quetiapine through [facility].   Plan -Referral to [facility] LCSW, behavioral health resources       Relevant Orders   AMB Referral [facility] Care Management   Current smoker   Continues smoking 2 Black & Mild/day. She would like to quit but has not yet started. She was not able to start the book club she  mentioned at last visit. She seems motivated today but will continue to follow. Reviewed health benefits of quitting.  Plan -Continue to address smoking cessation      Morbid obesity (HCC)   BMI of 35 with serious comorbidity of T2DM, hypertension, and HLD. She has made significant changes and lost weight with lifestyle which she wishes to continue.   Plan -Continue healthy lifestyle changes      HLD (hyperlipidemia)   Continues on atorvastatin 40 mg daily. LDL goal <70 given history of stroke.  Plan -Lipid panel -Continue atorva 40      Relevant Orders   Lipid Profile   Screening for malignant neoplasm of colon   Brought in FIT kit today for CRC screening. F/u results.      Relevant Orders   Fecal occult blood, imunochemical   Elevated lymphocytes   Repeat CBC today to follow mild lymphocytosis.      Relevant Orders   CBC with Diff   Cervical cancer screening   Pap screening 2/204 with evidence of hyperkeratosis, repeat Pap recommended in 6 months. Ms. Meldrum wishes to wait today, we discussed the importance of completing this exam. F/u in 4 weeks.      Other Visit Diagnoses       Screening mammogram for breast cancer           Return in about 4 weeks (around 01/14/2024) for Pap smear.    Dickie La, MD

## 2023-12-17 NOTE — Assessment & Plan Note (Signed)
Follows with [facility], last visit 08/2023. Labs reviewed, stable Cr/eGFR. ACR improved. F/u planned q3m.  Plan -Continue lisinopril and empagliflozin -F/u with [facility] q30m, BMP/ACR obtained there

## 2023-12-17 NOTE — Assessment & Plan Note (Signed)
Blood pressure elevated at today's visit, 157/89. Kendra Vega has been adherent to her medication regimen and blood pressure has been well controlled over the last year. She has been under significant stress from the recent poor health of her father. She got into an argument with her sister right before today's visit. She is also having pain from her left foot, addressed elsewhere. Denies chest pain, SOB, swelling, dizziness, syncope.  Given her extensive regimen, recent control, and current pain/stressors, we have discussed home monitoring and return in one month for repeat BP check.  Plan -Continue amlodipine 10, lasix 80, lisinopril 40, labetalol 200 mg bid -BMP at [facility] in 08/2023 stable -F/u in one month

## 2023-12-17 NOTE — Assessment & Plan Note (Signed)
Bilateral feet with multiple hyperkeratotic papules over plantar, medial, and lateral surface. Large area on the left plantar surface has been causing significant pain since debridement by podiatry in August 2024. Ms. Hoffmeyer reports this area bled and has been very painful since that time, causing her to have difficulty standing and walking for long periods. 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 Diagnosis in podiatry note of porokeratosis, however areas today appear as callus/corns. These do not appear consistent with warts. Given multiple in nature and history of diabetes, I do recommend f/u with podiatry, however Ms. Maeder request referral to an alternative office. I have prescribed salicyclic acid pads for the painful left foot corn. Work note also provided to allow for seated work while we work on treating these areas.  Plan -Referral to podiatry -Salicyclic acid pads

## 2023-12-17 NOTE — Assessment & Plan Note (Signed)
Sinus congestion and runny nose over the last week. No fevers. Exam with mildly erythematous nasal turbinates, clear ears, no sinus tenderness. Suspect viral rhinosinusitis. Recommend conservative treatment with nasal steroid.  Plan -Flonase daily -Return if sx worsening or not improving

## 2023-12-17 NOTE — Assessment & Plan Note (Signed)
Follows with vascular surgery, s/p stent 06/2022. Continues on DAPT for history of prior strokes. F/u with surgery planned 01/2024.  Plan -Continue DAPT -F/u with vascular surgery 01/2024

## 2023-12-17 NOTE — Assessment & Plan Note (Signed)
Continues on atorvastatin 40 mg daily. LDL goal <70 given history of stroke.  Plan -Lipid panel -Continue atorva 40

## 2023-12-17 NOTE — Assessment & Plan Note (Addendum)
Continues smoking 2 Black & Mild/day. She would like to quit but has not yet started. She was not able to start the book club she mentioned at last visit. She seems motivated today but will continue to follow. Reviewed health benefits of quitting.  Plan -Continue to address smoking cessation

## 2023-12-17 NOTE — Assessment & Plan Note (Signed)
No significant improvement in right wrist pain with splint, topical NSAIDs, and acetaminophen. Not able to use oral NSAIDs due to kidney function. Continued pain with palpation over the right radial wrist, positive Finkelstein. Amenable to hand surgery referral to consideration of injection.  Pla -Referral to hand surgery -Continue splint, topical NSAID, acetaminophen prn

## 2023-12-17 NOTE — Patient Instructions (Addendum)
It was wonderful to see you today!  For nasal congestion, start using Flonase 2 sprays in each nostril daily. This can take a few days to work so stick with it.  For your left foot wart/corn, please try the salicyclic acid pads. Soak your foot for at least five minutes in warm water, then dry completely and place the pad over the area and leave in place for 48 hours. You may repeat every 48 hours for up to 14 days.  I have placed a referral to a new foot doctor, eye doctor, Hydrographic surveyor, and counselor.   Keep working on weight loss, you are doing great! Try to cut out smoking completely as this will be very helpful to your overall health.  Please contact your pharmacy about scheduling the shingles (Shingrix) vaccine.

## 2023-12-17 NOTE — Assessment & Plan Note (Signed)
Repeat CBC today to follow mild lymphocytosis.

## 2023-12-17 NOTE — Assessment & Plan Note (Signed)
GERD continues to be well controlled with PPI twice daily. Kendra Vega notes her symptoms are worse when she forgets to take the second pill. Continue for now.  Plan -Pantoprazole bid

## 2023-12-17 NOTE — Assessment & Plan Note (Signed)
BMI of 35 with serious comorbidity of T2DM, hypertension, and HLD. She has made significant changes and lost weight with lifestyle which she wishes to continue.   Plan -Continue healthy lifestyle changes

## 2023-12-17 NOTE — Assessment & Plan Note (Signed)
A1c stable at 5.8%. Continues to work on lifestyle changes for weight loss. Committed to restart this again after the holidays. Urine ACR at [facility] 08/2023 improved to 49. Remains on statin.   Plan -Continue empagliflozin 10 mg daily -Repeat A1c q15m -Referral for diabetes eye exam

## 2023-12-18 ENCOUNTER — Telehealth: Payer: Self-pay | Admitting: *Deleted

## 2023-12-18 ENCOUNTER — Ambulatory Visit: Payer: Self-pay | Admitting: Licensed Clinical Social Worker

## 2023-12-18 ENCOUNTER — Other Ambulatory Visit (HOSPITAL_COMMUNITY): Payer: Self-pay

## 2023-12-18 LAB — CBC WITH DIFFERENTIAL/PLATELET
Basophils Absolute: 0.1 10*3/uL (ref 0.0–0.2)
Basos: 1 %
EOS (ABSOLUTE): 0.3 10*3/uL (ref 0.0–0.4)
Eos: 3 %
Hematocrit: 41.5 % (ref 34.0–46.6)
Hemoglobin: 13.1 g/dL (ref 11.1–15.9)
Immature Grans (Abs): 0 10*3/uL (ref 0.0–0.1)
Immature Granulocytes: 0 %
Lymphocytes Absolute: 3 10*3/uL (ref 0.7–3.1)
Lymphs: 31 %
MCH: 26.1 pg — ABNORMAL LOW (ref 26.6–33.0)
MCHC: 31.6 g/dL (ref 31.5–35.7)
MCV: 83 fL (ref 79–97)
Monocytes Absolute: 0.5 10*3/uL (ref 0.1–0.9)
Monocytes: 6 %
Neutrophils Absolute: 5.8 10*3/uL (ref 1.4–7.0)
Neutrophils: 59 %
Platelets: 330 10*3/uL (ref 150–450)
RBC: 5.02 x10E6/uL (ref 3.77–5.28)
RDW: 15.5 % — ABNORMAL HIGH (ref 11.7–15.4)
WBC: 9.7 10*3/uL (ref 3.4–10.8)

## 2023-12-18 LAB — LIPID PANEL
Chol/HDL Ratio: 3.4 {ratio} (ref 0.0–4.4)
Cholesterol, Total: 155 mg/dL (ref 100–199)
HDL: 45 mg/dL (ref >39)
LDL Chol Calc (NIH): 91 mg/dL (ref 0–99)
Triglycerides: 103 mg/dL (ref 0–149)
VLDL Cholesterol Cal: 19 mg/dL (ref 5–40)

## 2023-12-18 MED ORDER — ATORVASTATIN CALCIUM 80 MG PO TABS
80.0000 mg | ORAL_TABLET | Freq: Every day | ORAL | 3 refills | Status: DC
  Filled 2023-12-18: qty 90, 90d supply, fill #0
  Filled 2024-04-02: qty 90, 90d supply, fill #1
  Filled 2024-08-19: qty 90, 90d supply, fill #2

## 2023-12-18 NOTE — Patient Instructions (Signed)
Visit Information  Thank you for taking time to visit with me today. Please don't hesitate to contact me if I can be of assistance to you.   Following are the goals we discussed today:   Goals Addressed             This Visit's Progress    Patient Stated       Care Coordination Interventions: Assessed Social Determinants of Health Reviewed all upcoming appointments in Epic system Motivational Interviewing employed Depression screen reviewed  Mindfulness or Relaxation training provided Active listening / Reflection utilized  Emotional Support Provided Participation in support group encouraged : Contact Grief support group through [facility] [employer] for List of housing options in pt's area. Monitor for mood changes/triggers and utilize coping skills as needed.          Our next appointment is by telephone on 01/01/2024 at 3:30PM  Please call the care guide team at [phone] if you need to cancel or reschedule your appointment.   If you are experiencing a Mental Health or Behavioral Health Crisis or need someone to talk to, please call the Suicide and Crisis Lifeline: 988  Patient verbalizes understanding of instructions and care plan provided today and agrees to view in MyChart. Active MyChart status and patient understanding of how to access instructions and care plan via MyChart confirmed with patient.     Telephone follow up appointment with care management team member scheduled for: 01/01/2024

## 2023-12-18 NOTE — Progress Notes (Signed)
LDL above goal. Discussed lifestyle factors including diet and exercise. Will increase atorvastatin to 80 mg to which Kendra Vega agrees. New prescription sent to pt pharmacy.

## 2023-12-18 NOTE — Addendum Note (Signed)
Addended by: Dickie La on: 12/18/2023 09:11 AM   Modules accepted: Orders

## 2023-12-18 NOTE — Progress Notes (Signed)
Complex Care Management Note  Care Guide Note 12/18/2023 Name: Sanai Arcilla MRN: [id number] DOB: 02-Dec-1970  Quanisha Piasecki is a 53 y.o. year old female who sees Dickie La, MD for primary care. I reached out to Nash Mantis by phone today to offer complex care management services.  Ms. Devine was given information about Complex Care Management services today including:   The Complex Care Management services include support from the care team which includes your Nurse Coordinator, Clinical Social Worker, or Pharmacist.  The Complex Care Management team is here to help remove barriers to the health concerns and goals most important to you. Complex Care Management services are voluntary, and the patient may decline or stop services at any time by request to their care team member.   Complex Care Management Consent Status: Patient agreed to services and verbal consent obtained.   Follow up plan:  Telephone appointment with complex care management team member scheduled for:  12/18/23  Encounter Outcome:  Patient Scheduled  Gwenevere Ghazi  [facility]  [facility] Residence Guide  Direct Dial: [phone]  Fax [phone]

## 2023-12-18 NOTE — Patient Outreach (Signed)
  Care Coordination   Initial Visit Note   12/18/2023 Name: Kendra Vega MRN: [id number] DOB: 05-31-1971  Kendra Vega is a 53 y.o. year old female who sees Dickie La, MD for primary care. I spoke with  Kendra Vega by phone today.  What matters to the patients health and wellness today?  Accessing community supports for grief and depression.    Goals Addressed             This Visit's Progress    Patient Stated       Care Coordination Interventions: Assessed Social Determinants of Health Reviewed all upcoming appointments in Epic system Motivational Interviewing employed Depression screen reviewed  Mindfulness or Relaxation training provided Active listening / Reflection utilized  Emotional Support Provided Participation in support group encouraged : Contact Grief support group through [facility] [employer] for List of housing options in pt's area. Monitor for mood changes/triggers and utilize coping skills as needed.          SDOH assessments and interventions completed:  Yes  SDOH Interventions Today    Flowsheet Row Most Recent Value  SDOH Interventions   Food Insecurity Interventions Other (Comment)  [Uses food banks, has food stamps, [employer]-Card and income from part time job]  Housing Interventions Intervention Not Indicated  Transportation Interventions Intervention Not Indicated  Utilities Interventions Intervention Not Indicated        Care Coordination Interventions:  Yes, provided   Interventions Today    Flowsheet Row Most Recent Value  Chronic Disease   Chronic disease during today's visit Other  [Depression/Grief]  General Interventions   General Interventions Discussed/Reviewed [facility] local grief support groups and their possible benefits. Pt was agreeable to contact support group [facility] [employer]]  Mental Health Interventions   Mental Health  Discussed/Reviewed Mental Health Discussed, Mental Health Reviewed, Coping Strategies, Grief and Loss  [CSW and pt discussed her MH history and current needs, discussed pt's losing son 4 years ago and mother 11 months ago. Discussed importance of aniversarries with grief and utilizing her support/coping skills. Monitoring mood/triggers.]        Follow up plan: Follow up call scheduled for 01/01/2024    Encounter Outcome:  Patient Visit Completed   Kenton Kingfisher, LCSW [place], [facility] Licensed Clinical Social Worker Care Coordinator [phone]

## 2023-12-18 NOTE — Progress Notes (Signed)
Lymphocytosis resolved.

## 2023-12-30 ENCOUNTER — Other Ambulatory Visit (HOSPITAL_COMMUNITY): Payer: Self-pay

## 2024-01-01 ENCOUNTER — Ambulatory Visit: Payer: Self-pay | Admitting: Licensed Clinical Social Worker

## 2024-01-01 NOTE — Patient Outreach (Signed)
  Care Coordination   01/01/2024 Name: Kendra Vega MRN: [id number] DOB: August 01, 1971   Care Coordination Outreach Attempts:  An unsuccessful telephone outreach was attempted today to offer the patient information about available complex care management services.  Follow Up Plan:  Additional outreach attempts will be made to offer the patient complex care management information and services.   Encounter Outcome:  No Answer   Care Coordination Interventions:  No, not indicated    Alm Armor, LCSW [place], [facility] Licensed Clinical Social Worker Care Coordinator [phone]

## 2024-01-03 ENCOUNTER — Ambulatory Visit: Payer: Self-pay | Admitting: Licensed Clinical Social Worker

## 2024-01-03 NOTE — Patient Outreach (Signed)
  Care Coordination   01/03/2024 Name: Kendra Vega MRN: [id number] DOB: [birth year]   Care Coordination Outreach Attempts:  A second unsuccessful outreach was attempted today to offer the patient with information about available complex care management services.  Follow Up Plan:  No further outreach attempts will be made at this time. We have been unable to contact the patient to offer or enroll patient in complex care management services.  Encounter Outcome:  No Answer   Care Coordination Interventions:  No, not indicated    Alm Armor, LCSW [place], [facility] Licensed Clinical Social Worker Care Coordinator [phone]

## 2024-01-06 ENCOUNTER — Ambulatory Visit (HOSPITAL_COMMUNITY): Payer: 59

## 2024-01-06 ENCOUNTER — Other Ambulatory Visit: Payer: Self-pay | Admitting: Internal Medicine

## 2024-01-06 DIAGNOSIS — Z1231 Encounter for screening mammogram for malignant neoplasm of breast: Secondary | ICD-10-CM

## 2024-01-14 ENCOUNTER — Ambulatory Visit: Payer: 59 | Admitting: Orthopedic Surgery

## 2024-01-16 ENCOUNTER — Other Ambulatory Visit: Payer: Self-pay | Admitting: Internal Medicine

## 2024-01-16 ENCOUNTER — Other Ambulatory Visit (HOSPITAL_COMMUNITY): Payer: Self-pay

## 2024-01-16 DIAGNOSIS — K219 Gastro-esophageal reflux disease without esophagitis: Secondary | ICD-10-CM

## 2024-01-16 MED ORDER — PANTOPRAZOLE SODIUM 40 MG PO TBEC
40.0000 mg | DELAYED_RELEASE_TABLET | Freq: Two times a day (BID) | ORAL | 3 refills | Status: DC
Start: 2024-01-16 — End: 2024-04-03
  Filled 2024-01-16: qty 180, 90d supply, fill #0

## 2024-01-16 NOTE — Telephone Encounter (Signed)
 Medication sent to pharmacy

## 2024-01-22 ENCOUNTER — Other Ambulatory Visit (HOSPITAL_COMMUNITY): Payer: Self-pay

## 2024-02-03 ENCOUNTER — Ambulatory Visit: Payer: 59

## 2024-02-14 ENCOUNTER — Ambulatory Visit

## 2024-02-17 ENCOUNTER — Encounter: Payer: Self-pay | Admitting: Physician Assistant

## 2024-02-17 ENCOUNTER — Ambulatory Visit (INDEPENDENT_AMBULATORY_CARE_PROVIDER_SITE_OTHER): Payer: 59 | Admitting: Physician Assistant

## 2024-02-17 ENCOUNTER — Ambulatory Visit (HOSPITAL_COMMUNITY)
Admission: RE | Admit: 2024-02-17 | Discharge: 2024-02-17 | Disposition: A | Discharge status: Home / Self Care | Payer: 59 | Source: Ambulatory Visit | Attending: Physician Assistant | Admitting: Physician Assistant

## 2024-02-17 VITALS — BP 150/95 | HR 69 | Temp 97.7°F | Ht 65.0 in | Wt 205.3 lb

## 2024-02-17 DIAGNOSIS — I1A0 Resistant hypertension: Secondary | ICD-10-CM | POA: Insufficient documentation

## 2024-02-17 DIAGNOSIS — I701 Atherosclerosis of renal artery: Secondary | ICD-10-CM | POA: Insufficient documentation

## 2024-02-17 NOTE — Progress Notes (Signed)
 Office Note   History of Present Illness   Kendra Vega is a 53 y.o. (1971/01/30) female who presents for follow-up of renal artery stenosis.  She has a history of left renal artery stenting on 07/03/2022 by Dr. Myra Gianotti.  This was done for renal artery stenosis with refractory hypertension.  She returns today for follow-up.  She states she has been doing okay since her last office visit.  She says she is grieving right now over a family loss.  Otherwise, in terms of her health things are doing good.  She recently lost about 50 pounds and physically feels much healthier.  She says that her blood pressure remains well-controlled after renal artery stenting.  Her blood pressure is this morning because she did not take her medicines this morning.  Current Outpatient Medications  Medication Sig Dispense Refill   Accu-Chek Softclix Lancets lancets Use up to 4 (four) times daily. 100 each 0   acetaminophen (TYLENOL) 500 MG tablet Take 1,000 mg by mouth every 8 (eight) hours as needed for moderate pain.     amLODipine (NORVASC) 10 MG tablet Take 1 tablet (10 mg total) by mouth daily. 90 tablet 3   aspirin EC 81 MG tablet Take 81 mg by mouth daily. Swallow whole.     atorvastatin (LIPITOR) 80 MG tablet Take 1 tablet (80 mg total) by mouth daily. 90 tablet 3   Biotin 16109 MCG TABS Take 1 tablet by mouth daily.     blood glucose meter kit and supplies Use up to 4 (four) times daily. 1 each 0   clopidogrel (PLAVIX) 75 MG tablet Take 1 tablet (75 mg total) by mouth daily. 90 tablet 3   diclofenac Sodium (VOLTAREN) 1 % GEL Apply 4 grams topically 4 (four) times daily. 100 g 1   empagliflozin (JARDIANCE) 10 MG TABS tablet Take 1 tablet (10 mg total) by mouth daily. 30 tablet 11   FLUoxetine (PROZAC) 40 MG capsule Take 1 capsule (40 mg total) by mouth daily. 90 capsule 2   fluticasone (FLONASE) 50 MCG/ACT nasal spray Place 2 sprays into both nostrils daily. 16 g 2   furosemide (LASIX) 80 MG tablet  Take 1 tablet (80 mg total) by mouth daily. 90 tablet 3   glucose blood test strip Use up to 4 (four) times daily. 100 each 0   labetalol (NORMODYNE) 200 MG tablet Take 1 tablet (200 mg total) by mouth 2 (two) times daily. 60 tablet 11   Lancets (ONETOUCH DELICA PLUS LANCET33G) MISC Use up to 4 (four) times daily. 100 each 0   lisinopril (ZESTRIL) 40 MG tablet Take 1 tablet (40 mg total) by mouth daily. 30 tablet 11   Omega-3 Fatty Acids (FISH OIL) 1000 MG CAPS Take 1,000 mg by mouth daily.     pantoprazole (PROTONIX) 40 MG tablet Take 1 tablet (40 mg total) by mouth 2 (two) times daily. 180 tablet 3   QUEtiapine (SEROQUEL XR) 300 MG 24 hr tablet Take 300 mg by mouth at bedtime.     Salicylic Acid 40 % PADS Apply 1 each topically every other day as needed. Before applying product, soak area in warm water for 5 minutes; dry area thoroughly, then apply medication. Apply directly over affected area; leave in place for 48 hours. Some products may be cut to fit area or secured with adhesive strips. May repeat procedure every 48 hours as needed for up to 14 days. 14 each 0   sennosides-docusate sodium (SENOKOT-S)  8.6-50 MG tablet Take 1 tablet by mouth daily.     simethicone (MYLICON) 125 MG chewable tablet Chew 125 mg by mouth every 6 (six) hours as needed for flatulence.     triamcinolone (KENALOG) 0.025 % ointment Apply 1 Application topically 2 (two) times daily as needed (eczema). 30 g 0   No current facility-administered medications for this visit.    REVIEW OF SYSTEMS (negative unless checked):   Cardiac:  []  Chest pain or chest pressure? []  Shortness of breath upon activity? []  Shortness of breath when lying flat? []  Irregular heart rhythm?  Vascular:  []  Pain in calf, thigh, or hip brought on by walking? []  Pain in feet at night that wakes you up from your sleep? []  Blood clot in your veins? []  Leg swelling?  Pulmonary:  []  Oxygen at home? []  Productive cough? []   Wheezing?  Neurologic:  []  Sudden weakness in arms or legs? []  Sudden numbness in arms or legs? []  Sudden onset of difficult speaking or slurred speech? []  Temporary loss of vision in one eye? []  Problems with dizziness?  Gastrointestinal:  []  Blood in stool? []  Vomited blood?  Genitourinary:  []  Burning when urinating? []  Blood in urine?  Psychiatric:  []  Major depression  Hematologic:  []  Bleeding problems? []  Problems with blood clotting?  Dermatologic:  []  Rashes or ulcers?  Constitutional:  []  Fever or chills?  Ear/Nose/Throat:  []  Change in hearing? []  Nose bleeds? []  Sore throat?  Musculoskeletal:  []  Back pain? []  Joint pain? []  Muscle pain?   Physical Examination   Vitals:   02/17/24 1121  BP: (!) 150/95  Pulse: 69  Temp: 97.7 F (36.5 C)  TempSrc: Temporal  SpO2: 98%  Weight: 205 lb 4.8 oz (93.1 kg)  Height: 5\' 5"  (1.651 m)   Body mass index is 34.16 kg/m.  General:  WDWN in NAD; vital signs documented above Gait: Not observed HENT: WNL, normocephalic Pulmonary: normal non-labored breathing , without rales, rhonchi,  wheezing Cardiac: Regular Abdomen: soft, NT, no masses Skin: without rashes Vascular Exam/Pulses: Bilateral upper and lower extremities warm and well-perfused Extremities: without ischemic changes, without gangrene , without cellulitis; without open wounds;  Musculoskeletal: no muscle wasting or atrophy  Neurologic: A&O X 3;  No focal weakness or paresthesias are detected Psychiatric:  The pt has Normal affect.  Non-Invasive Vascular imaging   Renal Artery Duplex (02/17/2024) Patent left renal artery stent.  Around 50 to 60% stenosis of the right renal artery, unchanged from prior duplex   Medical Decision Making   Kendra Vega is a 53 y.o. female who presents for surveillance of renal artery stenosis  Based on the patient's vascular studies, her left renal artery stent is patent without stenosis.  She has  unchanged 50 to 60% stenosis of the right renal artery She denies any changes to her antihypertensive medication regimen.  She was not able to take her medications this morning since she could not have breakfast before her ultrasound She states her blood pressure remains well-controlled after renal artery stenting She can follow-up with our office in 1 year with repeat renal artery duplex   Loel Dubonnet PA-C [facility] Office: [phone]  Clinic MD: Myra Gianotti

## 2024-03-17 ENCOUNTER — Ambulatory Visit
Admission: RE | Admit: 2024-03-17 | Discharge: 2024-03-17 | Disposition: A | Discharge status: Home / Self Care | Source: Ambulatory Visit | Attending: Internal Medicine

## 2024-03-17 DIAGNOSIS — Z1231 Encounter for screening mammogram for malignant neoplasm of breast: Secondary | ICD-10-CM

## 2024-03-18 LAB — LAB REPORT - SCANNED
Albumin, Urine POC: 155.9
Albumin/Creatinine Ratio, Urine, POC: 116
Creatinine, POC: 134.9 mg/dL

## 2024-03-20 ENCOUNTER — Other Ambulatory Visit: Payer: Self-pay | Admitting: Internal Medicine

## 2024-03-20 DIAGNOSIS — R928 Other abnormal and inconclusive findings on diagnostic imaging of breast: Secondary | ICD-10-CM

## 2024-04-02 ENCOUNTER — Other Ambulatory Visit: Payer: Self-pay | Admitting: Internal Medicine

## 2024-04-02 DIAGNOSIS — N1832 Chronic kidney disease, stage 3b: Secondary | ICD-10-CM

## 2024-04-02 DIAGNOSIS — E119 Type 2 diabetes mellitus without complications: Secondary | ICD-10-CM

## 2024-04-02 DIAGNOSIS — R809 Proteinuria, unspecified: Secondary | ICD-10-CM

## 2024-04-03 ENCOUNTER — Other Ambulatory Visit (HOSPITAL_COMMUNITY): Payer: Self-pay

## 2024-04-03 ENCOUNTER — Other Ambulatory Visit: Payer: Self-pay

## 2024-04-03 ENCOUNTER — Other Ambulatory Visit: Payer: Self-pay | Admitting: Internal Medicine

## 2024-04-03 DIAGNOSIS — K219 Gastro-esophageal reflux disease without esophagitis: Secondary | ICD-10-CM

## 2024-04-03 DIAGNOSIS — N1832 Chronic kidney disease, stage 3b: Secondary | ICD-10-CM

## 2024-04-03 DIAGNOSIS — R809 Proteinuria, unspecified: Secondary | ICD-10-CM

## 2024-04-03 DIAGNOSIS — E119 Type 2 diabetes mellitus without complications: Secondary | ICD-10-CM

## 2024-04-03 MED ORDER — PANTOPRAZOLE SODIUM 40 MG PO TBEC
40.0000 mg | DELAYED_RELEASE_TABLET | Freq: Two times a day (BID) | ORAL | 3 refills | Status: DC
  Filled 2024-04-03: qty 180, 90d supply, fill #0
  Filled 2024-07-19: qty 180, 90d supply, fill #1
  Filled 2024-10-12: qty 180, 90d supply, fill #2

## 2024-04-03 MED ORDER — FUROSEMIDE 80 MG PO TABS
80.0000 mg | ORAL_TABLET | Freq: Every day | ORAL | 3 refills | Status: AC
  Filled 2024-04-03: qty 90, 90d supply, fill #0
  Filled 2024-08-19: qty 90, 90d supply, fill #1

## 2024-04-03 MED ORDER — EMPAGLIFLOZIN 10 MG PO TABS
10.0000 mg | ORAL_TABLET | Freq: Every day | ORAL | 11 refills | Status: DC
  Filled 2024-04-03: qty 30, 30d supply, fill #0
  Filled 2024-05-19: qty 30, 30d supply, fill #1
  Filled 2024-06-24: qty 30, 30d supply, fill #2
  Filled 2024-07-19: qty 30, 30d supply, fill #3
  Filled 2024-08-19: qty 30, 30d supply, fill #4
  Filled 2024-10-12: qty 30, 30d supply, fill #5
  Filled 2024-11-13: qty 30, 30d supply, fill #6

## 2024-04-03 NOTE — Telephone Encounter (Signed)
 Medication sent to pharmacy

## 2024-04-03 NOTE — Telephone Encounter (Signed)
 Duplicate, medication refill has already been addressed

## 2024-04-14 ENCOUNTER — Other Ambulatory Visit: Payer: Self-pay | Admitting: Internal Medicine

## 2024-04-14 ENCOUNTER — Other Ambulatory Visit (HOSPITAL_COMMUNITY): Payer: Self-pay

## 2024-04-14 ENCOUNTER — Telehealth: Payer: Self-pay

## 2024-04-14 MED ORDER — CLOPIDOGREL BISULFATE 75 MG PO TABS
75.0000 mg | ORAL_TABLET | Freq: Every day | ORAL | 0 refills | Status: DC
  Filled 2024-04-14: qty 30, 30d supply, fill #0

## 2024-04-14 NOTE — Telephone Encounter (Signed)
 Copied from CRM [id number]. Topic: Clinical - Medication Question >> Apr 14, 2024  1:02 PM Lenon Radar A wrote: Reason for CRM: Patient contacted pharmacy and was advised that she will need to contact provider to request prescription refill as the prescription expired. I advised patient of refill process and that refills can take up to 3 business days. Patient understood. Please contact patient to advise if refill will be sent to pharmacy or if she needs to schedule an appointment.

## 2024-04-14 NOTE — Telephone Encounter (Unsigned)
 Copied from CRM [id number]. Topic: Clinical - Medication Refill >> Apr 14, 2024 12:58 PM Lenon Radar A wrote: Medication: clopidogrel  (PLAVIX ) 75 MG tablet  Has the patient contacted their pharmacy? Yes (Agent: If no, request that the patient contact the pharmacy for the refill. If patient does not wish to contact the pharmacy document the reason why and proceed with request.) (Agent: If yes, when and what did the pharmacy advise?)  Patient contacted pharmacy and was advised that she will need to contact provider to request prescription refill as the prescription expired. I advised patient of refill process and that refills can take up to 3 business days. Patient understood. Please contact patient to advise if refill will be sent to pharmacy or if she needs to schedule an appointment.   This is the patient's preferred pharmacy:  [facility] [address] [place] [address] Phone: [phone] Fax: [phone]   Is this the correct pharmacy for this prescription? Yes If no, delete pharmacy and type the correct one.   Has the prescription been filled recently? No  Is the patient out of the medication? Yes  Has the patient been seen for an appointment in the last year OR does the patient have an upcoming appointment? Yes  Can we respond through MyChart? Yes  Agent: Please be advised that Rx refills may take up to 3 business days. We ask that you follow-up with your pharmacy.

## 2024-04-15 NOTE — Telephone Encounter (Signed)
 Medication was sent to the pharmacy yesterday-04/14/24. I called the patient to schedule a follow up appointment. Unable to reach the patient. I lvm for her to give us  a call back.

## 2024-05-14 ENCOUNTER — Other Ambulatory Visit

## 2024-05-19 ENCOUNTER — Other Ambulatory Visit: Payer: Self-pay | Admitting: Internal Medicine

## 2024-05-19 ENCOUNTER — Other Ambulatory Visit (HOSPITAL_COMMUNITY): Payer: Self-pay

## 2024-05-19 MED ORDER — CLOPIDOGREL BISULFATE 75 MG PO TABS
75.0000 mg | ORAL_TABLET | Freq: Every day | ORAL | 0 refills | Status: DC
  Filled 2024-05-19: qty 30, 30d supply, fill #0

## 2024-05-19 NOTE — Telephone Encounter (Signed)
 Medication sent to pharmacy

## 2024-05-20 ENCOUNTER — Other Ambulatory Visit (HOSPITAL_COMMUNITY): Payer: Self-pay

## 2024-05-21 ENCOUNTER — Other Ambulatory Visit (HOSPITAL_COMMUNITY): Payer: Self-pay

## 2024-06-05 ENCOUNTER — Ambulatory Visit
Admission: RE | Admit: 2024-06-05 | Discharge: 2024-06-05 | Disposition: A | Discharge status: Home / Self Care | Source: Ambulatory Visit | Attending: Internal Medicine | Admitting: Internal Medicine

## 2024-06-05 DIAGNOSIS — R928 Other abnormal and inconclusive findings on diagnostic imaging of breast: Secondary | ICD-10-CM

## 2024-06-09 ENCOUNTER — Other Ambulatory Visit: Payer: Self-pay

## 2024-06-15 ENCOUNTER — Other Ambulatory Visit: Payer: Self-pay | Admitting: Internal Medicine

## 2024-06-15 ENCOUNTER — Other Ambulatory Visit (HOSPITAL_COMMUNITY): Payer: Self-pay

## 2024-06-16 ENCOUNTER — Other Ambulatory Visit (HOSPITAL_COMMUNITY): Payer: Self-pay

## 2024-06-16 MED ORDER — CLOPIDOGREL BISULFATE 75 MG PO TABS
75.0000 mg | ORAL_TABLET | Freq: Every day | ORAL | 0 refills | Status: DC
  Filled 2024-06-16: qty 30, 30d supply, fill #0

## 2024-06-16 NOTE — Telephone Encounter (Signed)
 Medication sent to pharmacy

## 2024-06-19 ENCOUNTER — Other Ambulatory Visit: Payer: Self-pay | Admitting: Internal Medicine

## 2024-06-19 ENCOUNTER — Other Ambulatory Visit (HOSPITAL_COMMUNITY): Payer: Self-pay

## 2024-06-19 DIAGNOSIS — I1A0 Resistant hypertension: Secondary | ICD-10-CM

## 2024-06-19 MED ORDER — LISINOPRIL 40 MG PO TABS
40.0000 mg | ORAL_TABLET | Freq: Every day | ORAL | 11 refills | Status: DC
  Filled 2024-06-19: qty 19, 19d supply, fill #0
  Filled 2024-06-19: qty 11, 11d supply, fill #0
  Filled 2024-07-19: qty 30, 30d supply, fill #1
  Filled 2024-08-19: qty 30, 30d supply, fill #2
  Filled 2024-09-15: qty 30, 30d supply, fill #3
  Filled 2024-11-13: qty 30, 30d supply, fill #4
  Filled 2024-12-18: qty 30, 30d supply, fill #5

## 2024-06-19 NOTE — Telephone Encounter (Signed)
 Medication sent to pharmacy

## 2024-06-30 ENCOUNTER — Ambulatory Visit: Payer: Self-pay | Admitting: Internal Medicine

## 2024-07-19 ENCOUNTER — Other Ambulatory Visit: Payer: Self-pay | Admitting: Internal Medicine

## 2024-07-19 ENCOUNTER — Other Ambulatory Visit: Payer: Self-pay

## 2024-07-20 ENCOUNTER — Other Ambulatory Visit (HOSPITAL_COMMUNITY): Payer: Self-pay

## 2024-07-20 ENCOUNTER — Other Ambulatory Visit: Payer: Self-pay

## 2024-07-20 MED ORDER — CLOPIDOGREL BISULFATE 75 MG PO TABS
75.0000 mg | ORAL_TABLET | Freq: Every day | ORAL | 0 refills | Status: DC
  Filled 2024-07-20: qty 30, 30d supply, fill #0

## 2024-07-28 ENCOUNTER — Other Ambulatory Visit (HOSPITAL_COMMUNITY): Payer: Self-pay

## 2024-08-19 ENCOUNTER — Other Ambulatory Visit: Payer: Self-pay

## 2024-08-19 ENCOUNTER — Other Ambulatory Visit: Payer: Self-pay | Admitting: Internal Medicine

## 2024-08-20 ENCOUNTER — Other Ambulatory Visit: Payer: Self-pay

## 2024-08-20 ENCOUNTER — Other Ambulatory Visit (HOSPITAL_COMMUNITY): Payer: Self-pay

## 2024-08-20 MED ORDER — CLOPIDOGREL BISULFATE 75 MG PO TABS
75.0000 mg | ORAL_TABLET | Freq: Every day | ORAL | 0 refills | Status: DC
  Filled 2024-08-20: qty 30, 30d supply, fill #0

## 2024-08-26 ENCOUNTER — Other Ambulatory Visit (HOSPITAL_COMMUNITY): Payer: Self-pay

## 2024-09-01 ENCOUNTER — Other Ambulatory Visit: Payer: Self-pay

## 2024-09-01 ENCOUNTER — Ambulatory Visit: Admitting: Internal Medicine

## 2024-09-01 ENCOUNTER — Encounter: Payer: Self-pay | Admitting: Internal Medicine

## 2024-09-01 VITALS — BP 130/58 | HR 79 | Temp 98.3°F | Ht 65.0 in | Wt 211.2 lb

## 2024-09-01 DIAGNOSIS — Z1239 Encounter for other screening for malignant neoplasm of breast: Secondary | ICD-10-CM | POA: Insufficient documentation

## 2024-09-01 DIAGNOSIS — Z1231 Encounter for screening mammogram for malignant neoplasm of breast: Secondary | ICD-10-CM

## 2024-09-01 DIAGNOSIS — Z79899 Other long term (current) drug therapy: Secondary | ICD-10-CM

## 2024-09-01 DIAGNOSIS — Z124 Encounter for screening for malignant neoplasm of cervix: Secondary | ICD-10-CM

## 2024-09-01 DIAGNOSIS — Z8249 Family history of ischemic heart disease and other diseases of the circulatory system: Secondary | ICD-10-CM

## 2024-09-01 DIAGNOSIS — Z6835 Body mass index (BMI) 35.0-35.9, adult: Secondary | ICD-10-CM | POA: Diagnosis not present

## 2024-09-01 DIAGNOSIS — E785 Hyperlipidemia, unspecified: Secondary | ICD-10-CM

## 2024-09-01 DIAGNOSIS — Z8673 Personal history of transient ischemic attack (TIA), and cerebral infarction without residual deficits: Secondary | ICD-10-CM

## 2024-09-01 DIAGNOSIS — R5383 Other fatigue: Secondary | ICD-10-CM

## 2024-09-01 DIAGNOSIS — E1122 Type 2 diabetes mellitus with diabetic chronic kidney disease: Secondary | ICD-10-CM | POA: Diagnosis not present

## 2024-09-01 DIAGNOSIS — N1832 Chronic kidney disease, stage 3b: Secondary | ICD-10-CM | POA: Diagnosis not present

## 2024-09-01 DIAGNOSIS — Z7985 Long-term (current) use of injectable non-insulin antidiabetic drugs: Secondary | ICD-10-CM

## 2024-09-01 DIAGNOSIS — E1121 Type 2 diabetes mellitus with diabetic nephropathy: Secondary | ICD-10-CM | POA: Diagnosis not present

## 2024-09-01 DIAGNOSIS — F17210 Nicotine dependence, cigarettes, uncomplicated: Secondary | ICD-10-CM

## 2024-09-01 DIAGNOSIS — Z1211 Encounter for screening for malignant neoplasm of colon: Secondary | ICD-10-CM

## 2024-09-01 DIAGNOSIS — Z7984 Long term (current) use of oral hypoglycemic drugs: Secondary | ICD-10-CM

## 2024-09-01 DIAGNOSIS — Z833 Family history of diabetes mellitus: Secondary | ICD-10-CM

## 2024-09-01 LAB — GLUCOSE, CAPILLARY: Glucose-Capillary: 124 mg/dL — ABNORMAL HIGH (ref 70–99)

## 2024-09-01 LAB — POCT GLYCOSYLATED HEMOGLOBIN (HGB A1C): HbA1c, POC (controlled diabetic range): 5.7 % (ref 0.0–7.0)

## 2024-09-01 MED ORDER — TIRZEPATIDE 2.5 MG/0.5ML ~~LOC~~ SOAJ
2.5000 mg | SUBCUTANEOUS | 0 refills | Status: DC
  Filled 2024-09-01: qty 2, 28d supply, fill #0

## 2024-09-01 NOTE — Progress Notes (Signed)
 Established Patient Office Visit  Subjective   Patient ID: Kendra Vega, female    DOB: 02-23-1971  Age: 53 y.o. MRN: [id number]  Chief Complaint  Patient presents with   Follow-up    Diabetes. Weight loss. No energy.    Kendra Vega returns today for follow-up of chronic medical conditions. Please see assessment/plan in problem-based charting for further details of today's visit.   Patient Active Problem List   Diagnosis Date Noted   Corn of foot 12/17/2023   Sinus congestion 12/17/2023   Tenosynovitis, de Quervain 06/26/2023   Renal artery stenosis 01/14/2023   Elevated lymphocytes 01/14/2023   Chronic pain of left knee 01/14/2023   Perimenopausal vasomotor symptoms 01/14/2023   Cervical cancer screening 01/14/2023   Eczema 07/09/2022   Venous insufficiency of left leg 04/30/2022   Screening for malignant neoplasm of colon 04/30/2022   Chronic bilateral low back pain without sciatica 04/03/2022   Restless leg 03/15/2020   Type 2 diabetes mellitus with diabetic nephropathy, without long-term current use of insulin  (HCC) 10/21/2018   HLD (hyperlipidemia) 08/03/2017   History of CVA (cerebrovascular accident) 10/28/2016   Stage 3b chronic kidney disease (HCC) 10/28/2016   Morbid obesity (HCC) 01/19/2011   GASTROESOPHAGEAL REFLUX DISEASE 08/10/2010   Depression with anxiety 06/27/2010   Current smoker 06/27/2010   Resistant hypertension 06/07/2010   CRAO (central retinal artery occlusion), right 06/07/2010      Objective:     BP (!) 130/58 (BP Location: Right Arm, Patient Position: Sitting, Cuff Size: Large)   Pulse 79   Temp 98.3 F (36.8 C) (Oral)   Ht 5' 5 (1.651 m)   Wt 211 lb 3.2 oz (95.8 kg)   SpO2 100% Comment: RA  BMI 35.15 kg/m  BP Readings from Last 3 Encounters:  09/01/24 (!) 130/58  02/17/24 (!) 150/95  12/17/23 (!) 157/89   Wt Readings from Last 3 Encounters:  09/01/24 211 lb 3.2 oz (95.8 kg)  02/17/24 205 lb 4.8 oz (93.1 kg)  12/17/23  211 lb 3.2 oz (95.8 kg)    Physical Exam Constitutional:      General: She is not in acute distress.    Appearance: Normal appearance. She is not ill-appearing.  Pulmonary:     Effort: Pulmonary effort is normal.  Neurological:     Mental Status: She is alert. Mental status is at baseline.  Psychiatric:        Mood and Affect: Mood normal.        Behavior: Behavior normal.       Assessment & Plan:   Problem List Items Addressed This Visit       Endocrine   Type 2 diabetes mellitus with diabetic nephropathy, without long-term current use of insulin  (HCC) - Primary   A1 stable. Remains on empagliflozin  10 mg daily. 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 Recommend adding tirzepatide today for further benefits, given kidney disease/albuminuria, history of cardiovascular disease (stroke), and obesity.  Plan -Continue empagliflozin  10 mg daily -Add tirzepatide 2.5 mg weekly; reviewed administration and potential AEs; f/u in 4 weeks -A1c q5m -Discuss previously placed referral to ophtho at 1 month f/u      Relevant Medications   tirzepatide (MOUNJARO) 2.5 MG/0.5ML Pen   Other Relevant Orders   POC Hbg A1C (Completed)   Glucose, capillary (Completed)     Other   Morbid obesity (HCC)   BMI stable at 35 with serious comorbidity of T2DM, hypertension, HLD, and history of stroke. Prior weight  loss with use of semaglutide , as well as lifestyle changes. Maintained weight loss now with lifestyle off medication but has been unable to progress. Continue healthy lifestyle.      Relevant Medications   tirzepatide [facility]) 2.5 MG/0.5ML Pen   History of CVA (cerebrovascular accident)   History of multiple stroke events, one resulting in CRAO and right eye visual loss. Kendra Vega remains on aspirin  and Plavix . Remains on high-intensity statin. Continue to work on lifestyle modification, as well as smoking cessation, diabetes, and hypertension control to reduce risk. Addition of  tirzepatide for DM and weight loss also likely to reduce further ASCVD risk.      Relevant Medications   tirzepatide (MOUNJARO) 2.5 MG/0.5ML Pen   HLD (hyperlipidemia)   Increased to atorvastatin  80 mg daily at last visit. LDL goal <70 given history of stroke.  Plan -Lipid panel -Continue atorva 80      Relevant Orders   Lipid Profile   Screening for malignant neoplasm of colon   Encouraged to return FIT kit provided at last office visit.      Other fatigue   Several months of fatigue, malaise, low motivation. No specific symptoms to report other than this. She did stop working around this time but does not feel her mood has been different due to this. She continues to follow with behavioral health at [facility] for depression/anxiety. Discussed screening for some common conditions with labs today and will proceed from there. Reviewed appropriate sleep behavior and lifestyle modification to help with sleep.  Plan -CBC w/ iron -TSH -B12      Relevant Orders   Vitamin B12   TSH Rfx on Abnormal to Free T4   Iron, TIBC and Ferritin Panel   CBC with Diff   Cervical cancer screening   Pap screening 2/204 with evidence of hyperkeratosis, repeat Pap recommended in 6 months. Kendra Vega wishes to wait today, we discussed the importance of completing this exam. F/u in 4 weeks.      Breast cancer screening   Abnormal mammogram earlier this year, f/u US  with benign findings in July. Radiology recommended breast cancer risk assessment. Using Gail risk assessment, patient has an 8% lifetime risk of breast cancer. Continue annual screening with mammography, due in April 2026.        Return in about 4 weeks (around 09/29/2024) for Pap smear.    Ronnald Sergeant, MD

## 2024-09-01 NOTE — Assessment & Plan Note (Signed)
 Abnormal mammogram earlier this year, f/u US  with benign findings in July. Radiology recommended breast cancer risk assessment. Using Gail risk assessment, patient has an 8% lifetime risk of breast cancer. Continue annual screening with mammography, due in April 2026.

## 2024-09-01 NOTE — Assessment & Plan Note (Addendum)
 Several months of fatigue, malaise, low motivation. No specific symptoms to report other than this. She did stop working around this time but does not feel her mood has been different due to this. She continues to follow with behavioral health at [facility] for depression/anxiety. Discussed screening for some common conditions with labs today and will proceed from there. Reviewed appropriate sleep behavior and lifestyle modification to help with sleep.  Plan -CBC w/ iron -TSH -B12

## 2024-09-01 NOTE — Patient Instructions (Signed)
 It was wonderful to see you today!  For diabetes and weight loss, as well as your history of stroke, start tirzepatide 2.5 mg once weekly. I will see you back in 4 weeks to talk about increasing.   We will also plan to complete your Pap smear at that visit. It is important to get this done! Please bring back your stool kit at that visit.  I will call or message you with lab results when available.  *For exercise classes (in-person and online), group activities, and more* (50+)            [email or web address]

## 2024-09-01 NOTE — Assessment & Plan Note (Signed)
 A1 stable. Remains on empagliflozin  10 mg daily. 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 Recommend adding tirzepatide today for further benefits, given kidney disease/albuminuria, history of cardiovascular disease (stroke), and obesity.  Plan -Continue empagliflozin  10 mg daily -Add tirzepatide 2.5 mg weekly; reviewed administration and potential AEs; f/u in 4 weeks -A1c q38m -Discuss previously placed referral to ophtho at 1 month f/u

## 2024-09-01 NOTE — Assessment & Plan Note (Signed)
 Pap screening 2/204 with evidence of hyperkeratosis, repeat Pap recommended in 6 months. Kendra Vega wishes to wait today, we discussed the importance of completing this exam. F/u in 4 weeks.

## 2024-09-01 NOTE — Assessment & Plan Note (Signed)
 Encouraged to return FIT kit provided at last office visit.

## 2024-09-01 NOTE — Assessment & Plan Note (Signed)
 BMI stable at 35 with serious comorbidity of T2DM, hypertension, HLD, and history of stroke. Prior weight loss with use of semaglutide , as well as lifestyle changes. Maintained weight loss now with lifestyle off medication but has been unable to progress. Continue healthy lifestyle.

## 2024-09-01 NOTE — Assessment & Plan Note (Signed)
 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 Kendra Vega remains on aspirin  and Plavix . Remains on high-intensity statin. Continue to work on lifestyle modification, as well as smoking cessation, diabetes, and hypertension control to reduce risk. Addition of tirzepatide for DM and weight loss also likely to reduce further ASCVD risk.

## 2024-09-01 NOTE — Assessment & Plan Note (Signed)
 Increased to atorvastatin  80 mg daily at last visit. LDL goal <70 given history of stroke.  Plan -Lipid panel -Continue atorva 80

## 2024-09-02 LAB — CBC WITH DIFFERENTIAL/PLATELET
Basophils Absolute: 0.1 x10E3/uL (ref 0.0–0.2)
Basos: 1 %
EOS (ABSOLUTE): 0.1 x10E3/uL (ref 0.0–0.4)
Eos: 1 %
Hematocrit: 42.3 % (ref 34.0–46.6)
Hemoglobin: 13.2 g/dL (ref 11.1–15.9)
Immature Grans (Abs): 0 x10E3/uL (ref 0.0–0.1)
Immature Granulocytes: 0 %
Lymphocytes Absolute: 3.4 x10E3/uL — ABNORMAL HIGH (ref 0.7–3.1)
Lymphs: 36 %
MCH: 26 pg — ABNORMAL LOW (ref 26.6–33.0)
MCHC: 31.2 g/dL — ABNORMAL LOW (ref 31.5–35.7)
MCV: 83 fL (ref 79–97)
Monocytes Absolute: 0.5 x10E3/uL (ref 0.1–0.9)
Monocytes: 6 %
Neutrophils Absolute: 5.1 x10E3/uL (ref 1.4–7.0)
Neutrophils: 56 %
Platelets: 329 x10E3/uL (ref 150–450)
RBC: 5.08 x10E6/uL (ref 3.77–5.28)
RDW: 16 % — ABNORMAL HIGH (ref 11.7–15.4)
WBC: 9.2 x10E3/uL (ref 3.4–10.8)

## 2024-09-02 LAB — LIPID PANEL
Chol/HDL Ratio: 3.3 ratio (ref 0.0–4.4)
Cholesterol, Total: 140 mg/dL (ref 100–199)
HDL: 42 mg/dL (ref >39)
LDL Chol Calc (NIH): 75 mg/dL (ref 0–99)
Triglycerides: 129 mg/dL (ref 0–149)
VLDL Cholesterol Cal: 23 mg/dL (ref 5–40)

## 2024-09-02 LAB — IRON,TIBC AND FERRITIN PANEL
Ferritin: 75 ng/mL (ref 15–150)
Iron Saturation: 14 % — ABNORMAL LOW (ref 15–55)
Iron: 42 ug/dL (ref 27–159)
Total Iron Binding Capacity: 311 ug/dL (ref 250–450)
UIBC: 269 ug/dL (ref 131–425)

## 2024-09-02 LAB — VITAMIN B12: Vitamin B-12: 490 pg/mL (ref 232–1245)

## 2024-09-03 ENCOUNTER — Ambulatory Visit: Payer: Self-pay | Admitting: Internal Medicine

## 2024-09-03 DIAGNOSIS — E1121 Type 2 diabetes mellitus with diabetic nephropathy: Secondary | ICD-10-CM

## 2024-09-03 NOTE — Progress Notes (Signed)
 Reviewed with Ms. Brabson 10/9. F/u in one month for Pap, bring FIT test. Referral placed for eye exam.

## 2024-09-07 ENCOUNTER — Other Ambulatory Visit: Payer: Self-pay

## 2024-09-08 LAB — TSH RFX ON ABNORMAL TO FREE T4: TSH: 1.54 u[IU]/mL (ref 0.450–4.500)

## 2024-09-08 LAB — SPECIMEN STATUS REPORT

## 2024-09-10 ENCOUNTER — Other Ambulatory Visit: Payer: Self-pay

## 2024-09-15 ENCOUNTER — Other Ambulatory Visit: Payer: Self-pay | Admitting: Internal Medicine

## 2024-09-16 ENCOUNTER — Other Ambulatory Visit: Payer: Self-pay

## 2024-09-16 ENCOUNTER — Other Ambulatory Visit (HOSPITAL_COMMUNITY): Payer: Self-pay

## 2024-09-16 MED ORDER — CLOPIDOGREL BISULFATE 75 MG PO TABS
75.0000 mg | ORAL_TABLET | Freq: Every day | ORAL | 0 refills | Status: DC
  Filled 2024-09-16: qty 30, 30d supply, fill #0

## 2024-09-16 MED ORDER — LABETALOL HCL 200 MG PO TABS
200.0000 mg | ORAL_TABLET | Freq: Two times a day (BID) | ORAL | 3 refills | Status: AC
  Filled 2024-09-16: qty 60, 30d supply, fill #0
  Filled 2024-11-13: qty 60, 30d supply, fill #1

## 2024-09-18 ENCOUNTER — Other Ambulatory Visit (HOSPITAL_COMMUNITY): Payer: Self-pay

## 2024-09-28 ENCOUNTER — Encounter: Payer: Self-pay | Admitting: Radiology

## 2024-10-12 ENCOUNTER — Other Ambulatory Visit: Payer: Self-pay | Admitting: Internal Medicine

## 2024-10-13 ENCOUNTER — Other Ambulatory Visit: Payer: Self-pay

## 2024-10-13 ENCOUNTER — Other Ambulatory Visit (HOSPITAL_COMMUNITY): Payer: Self-pay

## 2024-10-13 MED ORDER — CLOPIDOGREL BISULFATE 75 MG PO TABS
75.0000 mg | ORAL_TABLET | Freq: Every day | ORAL | 3 refills | Status: AC
  Filled 2024-10-13: qty 90, 90d supply, fill #0

## 2024-10-13 NOTE — Telephone Encounter (Signed)
 Medication sent to pharmacy

## 2024-11-09 ENCOUNTER — Ambulatory Visit: Payer: Self-pay

## 2024-11-23 ENCOUNTER — Other Ambulatory Visit: Payer: Self-pay

## 2024-12-02 ENCOUNTER — Ambulatory Visit

## 2024-12-14 ENCOUNTER — Ambulatory Visit: Payer: Self-pay

## 2024-12-18 ENCOUNTER — Encounter: Payer: Self-pay | Admitting: Student

## 2024-12-18 ENCOUNTER — Other Ambulatory Visit: Payer: Self-pay

## 2024-12-18 ENCOUNTER — Other Ambulatory Visit (HOSPITAL_COMMUNITY): Payer: Self-pay

## 2024-12-18 ENCOUNTER — Ambulatory Visit: Admitting: Student

## 2024-12-18 ENCOUNTER — Other Ambulatory Visit (HOSPITAL_COMMUNITY)
Admission: RE | Admit: 2024-12-18 | Discharge: 2024-12-18 | Disposition: A | Source: Ambulatory Visit | Attending: Internal Medicine | Admitting: Internal Medicine

## 2024-12-18 VITALS — BP 163/105 | HR 79 | Temp 98.0°F | Ht 69.0 in | Wt 219.0 lb

## 2024-12-18 DIAGNOSIS — R809 Proteinuria, unspecified: Secondary | ICD-10-CM

## 2024-12-18 DIAGNOSIS — R102 Pelvic and perineal pain unspecified side: Secondary | ICD-10-CM | POA: Insufficient documentation

## 2024-12-18 DIAGNOSIS — Z8673 Personal history of transient ischemic attack (TIA), and cerebral infarction without residual deficits: Secondary | ICD-10-CM

## 2024-12-18 DIAGNOSIS — E1121 Type 2 diabetes mellitus with diabetic nephropathy: Secondary | ICD-10-CM | POA: Diagnosis not present

## 2024-12-18 DIAGNOSIS — Z1151 Encounter for screening for human papillomavirus (HPV): Secondary | ICD-10-CM | POA: Diagnosis not present

## 2024-12-18 DIAGNOSIS — E1122 Type 2 diabetes mellitus with diabetic chronic kidney disease: Secondary | ICD-10-CM

## 2024-12-18 DIAGNOSIS — Z7985 Long-term (current) use of injectable non-insulin antidiabetic drugs: Secondary | ICD-10-CM | POA: Diagnosis not present

## 2024-12-18 DIAGNOSIS — F418 Other specified anxiety disorders: Secondary | ICD-10-CM

## 2024-12-18 DIAGNOSIS — R1024 Suprapubic pain: Secondary | ICD-10-CM

## 2024-12-18 DIAGNOSIS — Z124 Encounter for screening for malignant neoplasm of cervix: Secondary | ICD-10-CM | POA: Insufficient documentation

## 2024-12-18 DIAGNOSIS — Z7984 Long term (current) use of oral hypoglycemic drugs: Secondary | ICD-10-CM | POA: Diagnosis not present

## 2024-12-18 DIAGNOSIS — Z01419 Encounter for gynecological examination (general) (routine) without abnormal findings: Secondary | ICD-10-CM | POA: Diagnosis present

## 2024-12-18 DIAGNOSIS — K219 Gastro-esophageal reflux disease without esophagitis: Secondary | ICD-10-CM

## 2024-12-18 DIAGNOSIS — F17290 Nicotine dependence, other tobacco product, uncomplicated: Secondary | ICD-10-CM | POA: Diagnosis not present

## 2024-12-18 DIAGNOSIS — E785 Hyperlipidemia, unspecified: Secondary | ICD-10-CM

## 2024-12-18 DIAGNOSIS — I1A0 Resistant hypertension: Secondary | ICD-10-CM

## 2024-12-18 DIAGNOSIS — Z6832 Body mass index (BMI) 32.0-32.9, adult: Secondary | ICD-10-CM | POA: Diagnosis not present

## 2024-12-18 DIAGNOSIS — N1832 Chronic kidney disease, stage 3b: Secondary | ICD-10-CM | POA: Diagnosis not present

## 2024-12-18 DIAGNOSIS — F17200 Nicotine dependence, unspecified, uncomplicated: Secondary | ICD-10-CM

## 2024-12-18 LAB — POCT URINALYSIS DIPSTICK
Bilirubin, UA: NEGATIVE
Blood, UA: NEGATIVE
Glucose, UA: POSITIVE — AB
Ketones, UA: NEGATIVE
Leukocytes, UA: NEGATIVE
Nitrite, UA: NEGATIVE
Protein, UA: POSITIVE — AB
Spec Grav, UA: 1.015
Urobilinogen, UA: 0.2 U/dL
pH, UA: 5.5

## 2024-12-18 LAB — POCT GLYCOSYLATED HEMOGLOBIN (HGB A1C): HbA1c, POC (controlled diabetic range): 5.4 % (ref 0.0–7.0)

## 2024-12-18 LAB — GLUCOSE, CAPILLARY: Glucose-Capillary: 114 mg/dL — ABNORMAL HIGH (ref 70–99)

## 2024-12-18 MED ORDER — AMLODIPINE BESYLATE 10 MG PO TABS
10.0000 mg | ORAL_TABLET | Freq: Every day | ORAL | 4 refills | Status: AC
  Filled 2024-12-18: qty 90, 90d supply, fill #0

## 2024-12-18 MED ORDER — IRBESARTAN 300 MG PO TABS
300.0000 mg | ORAL_TABLET | Freq: Every day | ORAL | 3 refills | Status: AC
  Filled 2024-12-18: qty 90, 90d supply, fill #0

## 2024-12-18 MED ORDER — EMPAGLIFLOZIN 10 MG PO TABS
10.0000 mg | ORAL_TABLET | Freq: Every day | ORAL | 4 refills | Status: DC
  Filled 2024-12-18: qty 90, 90d supply, fill #0

## 2024-12-18 MED ORDER — MOUNJARO 5 MG/0.5ML ~~LOC~~ SOAJ
5.0000 mg | SUBCUTANEOUS | 2 refills | Status: AC
  Filled 2024-12-18 (×3): qty 2, 28d supply, fill #0

## 2024-12-18 MED ORDER — TIRZEPATIDE 2.5 MG/0.5ML ~~LOC~~ SOAJ
2.5000 mg | SUBCUTANEOUS | 0 refills | Status: AC
  Filled 2024-12-18: qty 2, 28d supply, fill #0

## 2024-12-18 MED ORDER — SIMETHICONE 125 MG PO CHEW
125.0000 mg | CHEWABLE_TABLET | Freq: Four times a day (QID) | ORAL | 0 refills | Status: AC | PRN
  Filled 2024-12-18: qty 30, 8d supply, fill #0

## 2024-12-18 MED ORDER — PANTOPRAZOLE SODIUM 40 MG PO TBEC
40.0000 mg | DELAYED_RELEASE_TABLET | Freq: Two times a day (BID) | ORAL | 3 refills | Status: AC
  Filled 2024-12-18: qty 180, 90d supply, fill #0

## 2024-12-18 MED ORDER — NICOTINE POLACRILEX 4 MG MT GUM
CHEWING_GUM | OROMUCOSAL | 0 refills | Status: AC
  Filled 2024-12-18: qty 110, 5d supply, fill #0

## 2024-12-18 MED ORDER — ATORVASTATIN CALCIUM 80 MG PO TABS
80.0000 mg | ORAL_TABLET | Freq: Every day | ORAL | 3 refills | Status: AC
  Filled 2024-12-18: qty 90, 90d supply, fill #0

## 2024-12-18 NOTE — Progress Notes (Signed)
 "  Established Patient Office Visit  Subjective   Patient ID: Kendra Vega, female    DOB: 1971-01-27  Age: 54 y.o. MRN: [id number]  Chief Complaint  Patient presents with   Follow-up   Diabetes   Depression   Anxiety   Hypertension   Gynecologic Exam   Urinary Tract Infection    Pt reports poss uti states it hurts when she has to  got  to use the rest room and also when she coughs     Kendra Vega is a 54 y.o. female with past medical history of type 2 diabetes with diabetic nephropathy, morbid obesity, history of CVA on aspirin  and Plavix , HLD presents today to follow-up on chronic conditions.  Review of Systems:  As per assessment and Plan   Objective:     Vitals:   12/18/24 0917 12/18/24 0924 12/18/24 0959  BP: (!) 187/90 (!) 188/85 (!) 163/105  Pulse: 86 83 79  Temp: 98 F (36.7 C)    TempSrc: Oral    SpO2: 99%    Weight: 219 lb (99.3 kg)    Height: 5' 9 (1.753 m)     Physical Exam General: Sitting in chair, no acute distress Cardiovascular: Regular rate Pulmonary: Breathing comfortably Abdomen: Soft, nontender, non distended, No suprapubic tenderness is noted.  MSK: No lower extremity edema bilaterally  Last metabolic panel Lab Results  Component Value Date   GLUCOSE 98 01/14/2023   NA 141 01/14/2023   K 3.5 01/14/2023   CL 103 01/14/2023   CO2 22 01/14/2023   BUN 10 01/14/2023   CREATININE 0.98 01/14/2023   EGFR 40.0 09/19/2023   CALCIUM  9.4 01/14/2023   PHOS 3.5 08/21/2018   PROT 6.9 02/04/2018   ALBUMIN 3.9 02/04/2018   LABGLOB 3.0 02/04/2018   AGRATIO 1.3 02/04/2018   BILITOT <0.2 02/04/2018   ALKPHOS 119 (H) 02/04/2018   AST 36 02/04/2018   ALT 50 (H) 02/04/2018   ANIONGAP 6 05/18/2019   Last lipids Lab Results  Component Value Date   CHOL 140 09/01/2024   HDL 42 09/01/2024   LDLCALC 75 09/01/2024   TRIG 129 09/01/2024   CHOLHDL 3.3 09/01/2024   Last hemoglobin A1c Lab Results  Component Value Date   HGBA1C 5.4  12/18/2024      The ASCVD Risk score (Arnett DK, et al., 2019) failed to calculate for the following reasons:   Risk score cannot be calculated because patient has a medical history suggesting prior/existing ASCVD   * - Cholesterol units were assumed    Assessment & Plan:   Patient discussed with Dr. Rosan Assessment & Plan Resistant hypertension BP Readings from Last 3 Encounters:  12/18/24 (!) 163/105  09/01/24 (!) 130/58  02/17/24 (!) 150/95  Has a history of renal artery stenosis status post stent 06/2022 on DAPT. Home medication consist amlodipine  10 mg, Lasix  80 mg, lisinopril  40 mg, labetalol  200 mg twice daily. Denies any chest pain, shortness of breath, lower extremity swelling, dizziness. At home, SBP 160s.  - Stop lisinopril , start irbesartan  300 mg daily; continue labetalol  200 mg twice daily, Lasix  80 mg daily, amlodipine  10 mg daily. -Follow-up BMP today to evaluate for serum creatinine and electrolytes Gastroesophageal reflux disease without esophagitis Home medication currently consist of pantoprazole  twice daily.  Refill sent to the pharmacy. Type 2 diabetes mellitus with diabetic nephropathy, without long-term current use of insulin  (HCC) Last A1c 5.7 checked on 09/01/2024; A1c today 5.4; Patient is currently on Jardiance  10 mg  daily with history of CKD; was not able to tolerate Ozempic , did do a 1 month trial of tirzepatide  2.5 mg weekly, tolerated well. - F/u Urine ACR - Mounjaro  2.5 mg weekly then titrated to 5 mg. - Continue Jardiance  10 mg daily. Depression with anxiety PHQ-9 score of 18; currently on fluoxetine  (Prozac ) 40 mg daily, Seroquel 300 mg at bedtime. Follows [facility], 2-3 months ago and sees a therapist.  Patient requested to see a psychiatrist in [place] where she can have a face-to-face conversation rather than virtual. - Referral to psychiatrist in [place] was placed - Patient was advised to follow-up with [facility]. Current smoker Smokes black  and mouse 2-3 a day; used to smoke 4-5; started age of 81; Patch gave her night mares.  - Nicotine  gum sent to the pharmacy Morbid obesity (HCC) Wt Readings from Last 3 Encounters:  12/18/24 219 lb (99.3 kg)  09/01/24 211 lb 3.2 oz (95.8 kg)  02/17/24 205 lb 4.8 oz (93.1 kg)  Plan to start Mounjaro  2.5 mg weekly then uptitrate to 5 mg.  Dietary and exercise counseling is provided. History of CVA (cerebrovascular accident) With history of central retinal artery occlusion, currently on aspirin  and Plavix . Hyperlipidemia, unspecified hyperlipidemia type Last LDL 75 checked on 09/01/2024, patient is currently on Lipitor 80 mg daily. Suprapubic pain Started last week, reports suprapubic tenderness. No dysuria or changes in urine frequncy. No CVA tenderness  POC UA > +glucose, negative for nitrites and leukocytes.  Upon reevaluation, patient reports that she takes as fluctuance, as it moves around her abdomen, and release after fluctuance.  Patient note was notified that she does not have UTI - Cranberry supplemental  - Mylicon sent to the pharmacy Encounter for Papanicolaou smear for cervical cancer screening Pap smear is performed in office. Stage 3b chronic kidney disease (HCC) Follow-up BMP today, continue Jardiance  10 mg daily. Albuminuria Follow-up BMP today, continue Jardiance  10 mg daily, follow-up urine ACR  Problem List Items Addressed This Visit       Cardiovascular and Mediastinum   Resistant hypertension - Primary   BP Readings from Last 3 Encounters:  12/18/24 (!) 163/105  09/01/24 (!) 130/58  02/17/24 (!) 150/95  Has a history of renal artery stenosis status post stent 06/2022 on DAPT. Home medication consist amlodipine  10 mg, Lasix  80 mg, lisinopril  40 mg, labetalol  200 mg twice daily. Denies any chest pain, shortness of breath, lower extremity swelling, dizziness. At home,  - Did she take medications this morning? - Change to valsartan  - Need refills?       Relevant  Medications   amLODipine  (NORVASC ) 10 MG tablet   atorvastatin  (LIPITOR) 80 MG tablet   irbesartan  (AVAPRO ) 300 MG tablet   Other Relevant Orders   Basic metabolic panel with GFR     Digestive   GASTROESOPHAGEAL REFLUX DISEASE   Home medication currently consist of pantoprazole  twice daily.      Relevant Medications   pantoprazole  (PROTONIX ) 40 MG tablet   simethicone  (MYLICON) 125 MG chewable tablet     Endocrine   Type 2 diabetes mellitus with diabetic nephropathy, without long-term current use of insulin  (HCC)   Last A1c 5.7 checked on 09/01/2024; A1c today 5.4; Patient is currently on Jardiance  10 mg daily with history of CKD; and tirzepatide  (Mounjaro ) 2.5 mg weekly. - F/u Urine ACR - Mounjaro  2.5 mg, out of it for 2 months  - ?Jaridance       Relevant Medications   tirzepatide  (MOUNJARO ) 2.5 MG/0.5ML  Pen   atorvastatin  (LIPITOR) 80 MG tablet   tirzepatide  (MOUNJARO ) 5 MG/0.5ML Pen (Start on 01/18/2025)   irbesartan  (AVAPRO ) 300 MG tablet   empagliflozin  (JARDIANCE ) 10 MG TABS tablet   Other Relevant Orders   POC Hbg A1C (Completed)   Microalbumin / Creatinine Urine Ratio   Glucose, capillary (Completed)     Genitourinary   Stage 3b chronic kidney disease (HCC)   Relevant Medications   empagliflozin  (JARDIANCE ) 10 MG TABS tablet     Other   Depression with anxiety   PHQ-9 score of 18 ; currently on fluoxetine  (Prozac ) 40 mg daily, Seroquel 300 mg at bedtime. Follows [facility], 2-3 months ago; Sees a therapist  - [place]       Relevant Orders   Ambulatory referral to Psychiatry   Current smoker   Smokes black and mouse 2-3 a day; used to smoke 4-5; started age of 54; Patch gave her night mares.  - Gums       Relevant Medications   nicotine  polacrilex (NICORETTE ) 4 MG gum   Morbid obesity (HCC)   Wt Readings from Last 3 Encounters:  12/18/24 219 lb (99.3 kg)  09/01/24 211 lb 3.2 oz (95.8 kg)  02/17/24 205 lb 4.8 oz (93.1 kg)  On  Mounjaro  2.5 mg weekly.       Relevant Medications   tirzepatide  (MOUNJARO ) 2.5 MG/0.5ML Pen   tirzepatide  (MOUNJARO ) 5 MG/0.5ML Pen (Start on 01/18/2025)   empagliflozin  (JARDIANCE ) 10 MG TABS tablet   History of CVA (cerebrovascular accident)   With history of central retinal artery occlusion, currently on aspirin  and Plavix .      Relevant Medications   tirzepatide  (MOUNJARO ) 2.5 MG/0.5ML Pen   HLD (hyperlipidemia)   Last LDL 75 checked on 09/01/2024, patient is currently on Lipitor 80 mg daily.      Relevant Medications   amLODipine  (NORVASC ) 10 MG tablet   atorvastatin  (LIPITOR) 80 MG tablet   irbesartan  (AVAPRO ) 300 MG tablet   Albuminuria   Relevant Medications   empagliflozin  (JARDIANCE ) 10 MG TABS tablet   Encounter for Papanicolaou smear for cervical cancer screening   Relevant Orders   Cervicovaginal ancillary only   Cytology - PAP   Suprapubic pain   Started last week, reports suprapubic tenderness. No dysuria or changes in urine frequncy. No CVA tenderness  - POC UA > +glucose, negative,nitrites and leukocytes.  - Cranberry supplemental      Relevant Medications   simethicone  (MYLICON) 125 MG chewable tablet   Other Relevant Orders   POCT Urinalysis Dipstick (18997) (Completed)    Return in about 3 months (around 03/18/2025) for HTN, DM, blood pressure check.    Toma Edwards, DO "

## 2024-12-18 NOTE — Assessment & Plan Note (Signed)
 Pap smear is performed in office.

## 2024-12-18 NOTE — Assessment & Plan Note (Addendum)
 Started last week, reports suprapubic tenderness. No dysuria or changes in urine frequncy. No CVA tenderness  POC UA > +glucose, negative for nitrites and leukocytes.  Upon reevaluation, patient reports that she takes as fluctuance, as it moves around her abdomen, and release after fluctuance.  Patient note was notified that she does not have UTI - Cranberry supplemental  - Mylicon sent to the pharmacy

## 2024-12-18 NOTE — Assessment & Plan Note (Addendum)
 BP Readings from Last 3 Encounters:  12/18/24 (!) 163/105  09/01/24 (!) 130/58  02/17/24 (!) 150/95  Has a history of renal artery stenosis status post stent 06/2022 on DAPT. Home medication consist amlodipine  10 mg, Lasix  80 mg, lisinopril  40 mg, labetalol  200 mg twice daily. Denies any chest pain, shortness of breath, lower extremity swelling, dizziness. At home, SBP 160s.  - Stop lisinopril , start irbesartan  300 mg daily; continue labetalol  200 mg twice daily, Lasix  80 mg daily, amlodipine  10 mg daily. -Follow-up BMP today to evaluate for serum creatinine and electrolytes

## 2024-12-18 NOTE — Assessment & Plan Note (Addendum)
 Smokes black and mouse 2-3 a day; used to smoke 4-5; started age of 40; Patch gave her night mares.  - Nicotine  gum sent to the pharmacy

## 2024-12-18 NOTE — Assessment & Plan Note (Signed)
 Follow-up BMP today, continue Jardiance  10 mg daily.

## 2024-12-18 NOTE — Assessment & Plan Note (Addendum)
 Last A1c 5.7 checked on 09/01/2024; A1c today 5.4; Patient is currently on Jardiance  10 mg daily with history of CKD; was not able to tolerate Ozempic , did do a 1 month trial of tirzepatide  2.5 mg weekly, tolerated well. - F/u Urine ACR - Mounjaro  2.5 mg weekly then titrated to 5 mg. - Continue Jardiance  10 mg daily.

## 2024-12-18 NOTE — Assessment & Plan Note (Addendum)
 Last LDL 75 checked on 09/01/2024, patient is currently on Lipitor 80 mg daily.

## 2024-12-18 NOTE — Assessment & Plan Note (Signed)
 Follow-up BMP today, continue Jardiance  10 mg daily, follow-up urine ACR

## 2024-12-18 NOTE — Assessment & Plan Note (Addendum)
 PHQ-9 score of 18; currently on fluoxetine  (Prozac ) 40 mg daily, Seroquel 300 mg at bedtime. Follows [facility], 2-3 months ago and sees a therapist.  Patient requested to see a psychiatrist in [place] where she can have a face-to-face conversation rather than virtual. - Referral to psychiatrist in [place] was placed - Patient was advised to follow-up with [facility].

## 2024-12-18 NOTE — Patient Instructions (Addendum)
 Thank you, Ms.Zakiah Karna Collet for allowing us  to provide your care today. Today we discussed:  Please STOP lisinopril  and START Irbesartan  one tablet daily  [facility] [address] [place], [place] [address] [phone]  I have ordered the following labs for you:   Lab Orders         Microalbumin / Creatinine Urine Ratio         Glucose, capillary         Basic metabolic panel with GFR         POC Hbg A1C         POCT Urinalysis Dipstick (81002)       Referrals ordered today:   Referral Orders  No referral(s) requested today     I have ordered the following medication/changed the following medications:   Stop the following medications: Medications Discontinued During This Encounter  Medication Reason   lisinopril  (ZESTRIL ) 40 MG tablet    amLODipine  (NORVASC ) 10 MG tablet Reorder   atorvastatin  (LIPITOR) 80 MG tablet Reorder   pantoprazole  (PROTONIX ) 40 MG tablet Reorder   tirzepatide  (MOUNJARO ) 2.5 MG/0.5ML Pen Reorder     Start the following medications: Meds ordered this encounter  Medications   tirzepatide  (MOUNJARO ) 2.5 MG/0.5ML Pen    Sig: Inject 2.5 mg into the skin once a week.    Dispense:  2 mL    Refill:  0    Refill 1/2   amLODipine  (NORVASC ) 10 MG tablet    Sig: Take 1 tablet (10 mg total) by mouth daily.    Dispense:  90 tablet    Refill:  4   atorvastatin  (LIPITOR) 80 MG tablet    Sig: Take 1 tablet (80 mg total) by mouth daily.    Dispense:  90 tablet    Refill:  3   pantoprazole  (PROTONIX ) 40 MG tablet    Sig: Take 1 tablet (40 mg total) by mouth 2 (two) times daily.    Dispense:  180 tablet    Refill:  3   tirzepatide  5 MG/0.5ML injection vial    Sig: Inject 5 mg into the skin once a week.    Dispense:  2 mL    Refill:  2    Refill 2/2   irbesartan  (AVAPRO ) 300 MG tablet    Sig: Take 1 tablet (300 mg total) by mouth daily.    Dispense:  90 tablet    Refill:  3     Follow up: 1  month for blood pressure check; otherwise 3 months HTN and DM     Remember:   Should you have any questions or concerns please call the internal medicine clinic at [phone].     Dr. Toma [facility]

## 2024-12-18 NOTE — Assessment & Plan Note (Addendum)
 With history of central retinal artery occlusion, currently on aspirin  and Plavix .

## 2024-12-18 NOTE — Assessment & Plan Note (Addendum)
 Wt Readings from Last 3 Encounters:  12/18/24 219 lb (99.3 kg)  09/01/24 211 lb 3.2 oz (95.8 kg)  02/17/24 205 lb 4.8 oz (93.1 kg)  Plan to start Mounjaro  2.5 mg weekly then uptitrate to 5 mg.  Dietary and exercise counseling is provided.

## 2024-12-18 NOTE — Assessment & Plan Note (Addendum)
 Home medication currently consist of pantoprazole  twice daily.  Refill sent to the pharmacy.

## 2024-12-19 LAB — BASIC METABOLIC PANEL WITH GFR
BUN/Creatinine Ratio: 12 (ref 9–23)
BUN: 16 mg/dL (ref 6–24)
CO2: 23 mmol/L (ref 20–29)
Calcium: 9.6 mg/dL (ref 8.7–10.2)
Chloride: 104 mmol/L (ref 96–106)
Creatinine, Ser: 1.31 mg/dL — ABNORMAL HIGH (ref 0.57–1.00)
Glucose: 95 mg/dL (ref 70–99)
Potassium: 4.1 mmol/L (ref 3.5–5.2)
Sodium: 141 mmol/L (ref 134–144)
eGFR: 48 mL/min/{1.73_m2} — ABNORMAL LOW

## 2024-12-19 LAB — MICROALBUMIN / CREATININE URINE RATIO
Creatinine, Urine: 67.8 mg/dL
Microalb/Creat Ratio: 283 mg/g{creat} — ABNORMAL HIGH (ref 0–29)
Microalbumin, Urine: 192.1 ug/mL

## 2024-12-21 LAB — CYTOLOGY - PAP
Comment: NEGATIVE
Diagnosis: NEGATIVE
High risk HPV: NEGATIVE

## 2024-12-21 LAB — CERVICOVAGINAL ANCILLARY ONLY
Bacterial Vaginitis (gardnerella): POSITIVE — AB
Candida Glabrata: NEGATIVE
Candida Vaginitis: NEGATIVE
Chlamydia: NEGATIVE
Comment: NEGATIVE
Comment: NEGATIVE
Comment: NEGATIVE
Comment: NEGATIVE
Comment: NEGATIVE
Comment: NORMAL
Neisseria Gonorrhea: NEGATIVE
Trichomonas: NEGATIVE

## 2024-12-22 ENCOUNTER — Ambulatory Visit: Payer: Self-pay | Admitting: Student

## 2024-12-22 ENCOUNTER — Other Ambulatory Visit (HOSPITAL_COMMUNITY): Payer: Self-pay

## 2024-12-22 DIAGNOSIS — N1832 Chronic kidney disease, stage 3b: Secondary | ICD-10-CM

## 2024-12-22 DIAGNOSIS — R809 Proteinuria, unspecified: Secondary | ICD-10-CM

## 2024-12-22 DIAGNOSIS — E1121 Type 2 diabetes mellitus with diabetic nephropathy: Secondary | ICD-10-CM

## 2024-12-22 MED ORDER — EMPAGLIFLOZIN 25 MG PO TABS
25.0000 mg | ORAL_TABLET | Freq: Every day | ORAL | 3 refills | Status: DC
  Filled 2024-12-22: qty 30, 30d supply, fill #0

## 2024-12-23 ENCOUNTER — Other Ambulatory Visit (HOSPITAL_COMMUNITY): Payer: Self-pay

## 2024-12-23 ENCOUNTER — Other Ambulatory Visit: Payer: Self-pay

## 2024-12-23 ENCOUNTER — Telehealth (HOSPITAL_COMMUNITY): Payer: Self-pay

## 2024-12-23 MED ORDER — FINERENONE 10 MG PO TABS
1.0000 | ORAL_TABLET | Freq: Every day | ORAL | 2 refills | Status: AC
  Filled 2024-12-23 – 2024-12-24 (×2): qty 30, 30d supply, fill #0

## 2024-12-23 MED ORDER — EMPAGLIFLOZIN 10 MG PO TABS
10.0000 mg | ORAL_TABLET | Freq: Every day | ORAL | 4 refills | Status: AC
  Filled 2024-12-23: qty 90, 90d supply, fill #0

## 2024-12-24 ENCOUNTER — Telehealth (HOSPITAL_COMMUNITY): Payer: Self-pay

## 2024-12-24 ENCOUNTER — Other Ambulatory Visit (HOSPITAL_COMMUNITY): Payer: Self-pay

## 2024-12-24 NOTE — Telephone Encounter (Signed)
/  Pharmacy Patient Advocate Encounter  Received notification from [facility] MEDICAID that Prior Authorization for Kerendia  10MG  tablets  has been APPROVED from 12/24/24 to 11/25/25. Ran test claim, Copay is $4.90. This test claim was processed through [facility]- copay amounts may vary at other pharmacies due to pharmacy/plan contracts, or as the patient moves through the different stages of their insurance plan.   PA #/Case ID/Reference #: [id number]

## 2024-12-24 NOTE — Telephone Encounter (Signed)
 Pharmacy Patient Advocate Encounter   Received notification from Pt Calls Messages that prior authorization for Kerendia  10MG  tablets  is required/requested.   Insurance verification completed.   The patient is insured through [place] MEDICAID.   Per test claim: PA required; PA submitted to above mentioned insurance via Latent Key/confirmation #/EOC [id number] Status is pending

## 2024-12-24 NOTE — Telephone Encounter (Signed)
 PA request has been Received. New Encounter has been or will be created for follow up. For additional info see Pharmacy Prior Auth telephone encounter from 12/24/24.

## 2024-12-25 ENCOUNTER — Other Ambulatory Visit (HOSPITAL_COMMUNITY): Payer: Self-pay

## 2024-12-26 NOTE — Progress Notes (Signed)
 Internal Medicine Clinic Attending  Case discussed with the resident at the time of the visit.  We reviewed the resident's history and exam and pertinent patient test results.  I agree with the assessment, diagnosis, and plan of care documented in the resident's note.

## 2025-01-18 ENCOUNTER — Ambulatory Visit: Payer: Self-pay

## 2025-02-10 ENCOUNTER — Ambulatory Visit: Payer: Self-pay
